# Patient Record
Sex: Female | Born: 1940
Health system: Southern US, Community
[De-identification: ages and names within clinical notes are randomized; demographics above are authoritative.]

## PROBLEM LIST (undated history)

## (undated) DIAGNOSIS — F419 Anxiety disorder, unspecified: Secondary | ICD-10-CM

## (undated) DIAGNOSIS — E119 Type 2 diabetes mellitus without complications: Secondary | ICD-10-CM

## (undated) DIAGNOSIS — F32A Depression, unspecified: Secondary | ICD-10-CM

## (undated) DIAGNOSIS — I639 Cerebral infarction, unspecified: Secondary | ICD-10-CM

## (undated) DIAGNOSIS — G2581 Restless legs syndrome: Secondary | ICD-10-CM

## (undated) DIAGNOSIS — I10 Essential (primary) hypertension: Secondary | ICD-10-CM

## (undated) DIAGNOSIS — F329 Major depressive disorder, single episode, unspecified: Secondary | ICD-10-CM

## (undated) DIAGNOSIS — K810 Acute cholecystitis: Secondary | ICD-10-CM

## (undated) HISTORY — DX: Restless legs syndrome: G25.81

## (undated) HISTORY — DX: Depression, unspecified: F32.A

## (undated) HISTORY — DX: Major depressive disorder, single episode, unspecified: F32.9

## (undated) HISTORY — DX: Anxiety disorder, unspecified: F41.9

---

## 1999-05-12 ENCOUNTER — Other Ambulatory Visit: Admission: RE | Admit: 1999-05-12 | Discharge: 1999-05-12 | Payer: Self-pay | Admitting: Family Medicine

## 2000-06-15 ENCOUNTER — Encounter: Admission: RE | Admit: 2000-06-15 | Discharge: 2000-06-15 | Payer: Self-pay | Admitting: Family Medicine

## 2000-06-15 ENCOUNTER — Encounter: Payer: Self-pay | Admitting: Family Medicine

## 2000-06-16 ENCOUNTER — Encounter: Admission: RE | Admit: 2000-06-16 | Discharge: 2000-06-16 | Payer: Self-pay | Admitting: Family Medicine

## 2000-06-16 ENCOUNTER — Encounter: Payer: Self-pay | Admitting: Family Medicine

## 2000-06-20 ENCOUNTER — Encounter: Payer: Self-pay | Admitting: Neurosurgery

## 2000-06-20 ENCOUNTER — Encounter: Admission: RE | Admit: 2000-06-20 | Discharge: 2000-06-20 | Payer: Self-pay | Admitting: Neurosurgery

## 2000-06-29 ENCOUNTER — Encounter: Payer: Self-pay | Admitting: Family Medicine

## 2000-06-29 ENCOUNTER — Encounter: Admission: RE | Admit: 2000-06-29 | Discharge: 2000-06-29 | Payer: Self-pay | Admitting: Family Medicine

## 2001-06-04 ENCOUNTER — Encounter: Admission: RE | Admit: 2001-06-04 | Discharge: 2001-06-04 | Payer: Self-pay | Admitting: Family Medicine

## 2001-06-04 ENCOUNTER — Encounter: Payer: Self-pay | Admitting: Family Medicine

## 2001-07-29 ENCOUNTER — Encounter: Admission: RE | Admit: 2001-07-29 | Discharge: 2001-10-27 | Payer: Self-pay | Admitting: Family Medicine

## 2001-11-25 ENCOUNTER — Encounter: Payer: Self-pay | Admitting: Family Medicine

## 2001-11-25 ENCOUNTER — Encounter: Admission: RE | Admit: 2001-11-25 | Discharge: 2001-11-25 | Payer: Self-pay | Admitting: Family Medicine

## 2002-07-22 ENCOUNTER — Encounter: Admission: RE | Admit: 2002-07-22 | Discharge: 2002-07-22 | Payer: Self-pay | Admitting: Family Medicine

## 2002-07-22 ENCOUNTER — Encounter: Payer: Self-pay | Admitting: Family Medicine

## 2003-08-10 ENCOUNTER — Encounter: Admission: RE | Admit: 2003-08-10 | Discharge: 2003-08-10 | Payer: Self-pay | Admitting: Family Medicine

## 2003-09-24 ENCOUNTER — Other Ambulatory Visit: Admission: RE | Admit: 2003-09-24 | Discharge: 2003-09-24 | Payer: Self-pay | Admitting: Family Medicine

## 2004-09-16 ENCOUNTER — Encounter: Admission: RE | Admit: 2004-09-16 | Discharge: 2004-09-16 | Payer: Self-pay | Admitting: Family Medicine

## 2005-12-08 ENCOUNTER — Encounter: Admission: RE | Admit: 2005-12-08 | Discharge: 2005-12-08 | Payer: Self-pay | Admitting: Family Medicine

## 2007-02-13 ENCOUNTER — Encounter: Admission: RE | Admit: 2007-02-13 | Discharge: 2007-02-13 | Payer: Self-pay | Admitting: Family Medicine

## 2007-07-05 ENCOUNTER — Emergency Department (HOSPITAL_COMMUNITY): Admission: EM | Admit: 2007-07-05 | Discharge: 2007-07-05 | Payer: Self-pay | Admitting: Emergency Medicine

## 2008-01-24 ENCOUNTER — Inpatient Hospital Stay (HOSPITAL_COMMUNITY): Admission: EM | Admit: 2008-01-24 | Discharge: 2008-01-27 | Payer: Self-pay | Admitting: Emergency Medicine

## 2008-01-24 ENCOUNTER — Encounter (INDEPENDENT_AMBULATORY_CARE_PROVIDER_SITE_OTHER): Payer: Self-pay | Admitting: Internal Medicine

## 2008-01-24 ENCOUNTER — Ambulatory Visit: Payer: Self-pay | Admitting: Physical Medicine & Rehabilitation

## 2008-01-24 ENCOUNTER — Ambulatory Visit: Payer: Self-pay | Admitting: Internal Medicine

## 2008-07-02 ENCOUNTER — Emergency Department (HOSPITAL_COMMUNITY): Admission: EM | Admit: 2008-07-02 | Discharge: 2008-07-02 | Payer: Self-pay | Admitting: Family Medicine

## 2008-07-20 ENCOUNTER — Emergency Department (HOSPITAL_COMMUNITY): Admission: EM | Admit: 2008-07-20 | Discharge: 2008-07-20 | Payer: Self-pay | Admitting: Emergency Medicine

## 2008-07-29 ENCOUNTER — Emergency Department (HOSPITAL_COMMUNITY): Admission: EM | Admit: 2008-07-29 | Discharge: 2008-07-29 | Payer: Self-pay | Admitting: Emergency Medicine

## 2008-08-08 ENCOUNTER — Emergency Department (HOSPITAL_COMMUNITY): Admission: EM | Admit: 2008-08-08 | Discharge: 2008-08-08 | Payer: Self-pay | Admitting: Emergency Medicine

## 2008-12-23 ENCOUNTER — Inpatient Hospital Stay (HOSPITAL_COMMUNITY): Admission: EM | Admit: 2008-12-23 | Discharge: 2008-12-26 | Payer: Self-pay | Admitting: Emergency Medicine

## 2010-12-21 LAB — CBC
HCT: 28.6 % — ABNORMAL LOW (ref 36.0–46.0)
Hemoglobin: 9.6 g/dL — ABNORMAL LOW (ref 12.0–15.0)
MCHC: 34.2 g/dL (ref 30.0–36.0)
MCV: 89.3 fL (ref 78.0–100.0)
Platelets: 188 10*3/uL (ref 150–400)
RBC: 3.21 MIL/uL — ABNORMAL LOW (ref 3.87–5.11)
RBC: 3.32 MIL/uL — ABNORMAL LOW (ref 3.87–5.11)
WBC: 8.3 10*3/uL (ref 4.0–10.5)
WBC: 9.2 10*3/uL (ref 4.0–10.5)

## 2010-12-21 LAB — GLUCOSE, CAPILLARY
Glucose-Capillary: 100 mg/dL — ABNORMAL HIGH (ref 70–99)
Glucose-Capillary: 79 mg/dL (ref 70–99)
Glucose-Capillary: 81 mg/dL (ref 70–99)
Glucose-Capillary: 100 mg/dL — ABNORMAL HIGH (ref 70–99)
Glucose-Capillary: 112 mg/dL — ABNORMAL HIGH (ref 70–99)
Glucose-Capillary: 79 mg/dL (ref 70–99)
Glucose-Capillary: 87 mg/dL (ref 70–99)
Glucose-Capillary: 97 mg/dL (ref 70–99)

## 2010-12-21 LAB — CROSSMATCH
ABO/RH(D): O POS
Antibody Screen: NEGATIVE

## 2010-12-21 LAB — URINE MICROSCOPIC-ADD ON

## 2010-12-21 LAB — COMPREHENSIVE METABOLIC PANEL
AST: 14 U/L (ref 0–37)
BUN: 19 mg/dL (ref 6–23)
CO2: 25 mEq/L (ref 19–32)
Calcium: 8.2 mg/dL — ABNORMAL LOW (ref 8.4–10.5)
Creatinine, Ser: 0.92 mg/dL (ref 0.4–1.2)
GFR calc Af Amer: >60 mL/min (ref >60)
GFR calc non Af Amer: >60 mL/min (ref >60)
Glucose, Bld: 104 mg/dL — ABNORMAL HIGH (ref 70–99)

## 2010-12-21 LAB — PROTIME-INR
INR: 1 (ref 0.00–1.49)
Prothrombin Time: 13.9 seconds (ref 11.6–15.2)

## 2010-12-21 LAB — DIFFERENTIAL
Basophils Absolute: 0 10*3/uL (ref 0.0–0.1)
Lymphocytes Relative: 39 % (ref 12–46)
Lymphs Abs: 2.5 10*3/uL (ref 0.7–4.0)
Neutro Abs: 3.6 10*3/uL (ref 1.7–7.7)
Neutrophils Relative %: 54 % (ref 43–77)

## 2010-12-21 LAB — URINALYSIS, ROUTINE W REFLEX MICROSCOPIC
Bilirubin Urine: NEGATIVE
Hgb urine dipstick: NEGATIVE
Ketones, ur: NEGATIVE mg/dL
Nitrite: NEGATIVE
Specific Gravity, Urine: 1.022 (ref 1.005–1.030)
pH: 5.5 (ref 5.0–8.0)

## 2010-12-21 LAB — HEMOGLOBIN A1C
Hgb A1c MFr Bld: 6 % (ref 4.6–6.1)
Mean Plasma Glucose: 126 mg/dL

## 2010-12-21 LAB — ABO/RH: ABO/RH(D): O POS

## 2010-12-21 LAB — IRON: Iron: 214 ug/dL — ABNORMAL HIGH (ref 42–135)

## 2010-12-21 LAB — FERRITIN: Ferritin: 44 ng/mL (ref 10–291)

## 2010-12-21 LAB — APTT: aPTT: 27 seconds (ref 24–37)

## 2011-01-24 NOTE — Discharge Summary (Signed)
NAMEKAELYNN, IGO NO.:  [id number]   MEDICAL RECORD NO.:  [id number]          PATIENT TYPE:  INP   LOCATION:  3016                         FACILITY:  [facility]   PHYSICIAN:  Lonia Blood, M.D.DATE OF BIRTH:  [birth year]   DATE OF ADMISSION:  01/24/2008  DATE OF DISCHARGE:  01/27/2008                               DISCHARGE SUMMARY   PRIMARY CARE PHYSICIAN:  Annia Friendly. Loleta Chance, MD.   DISCHARGE DIAGNOSES:  1. Left acute lacunar infarcts on the lateral thalamus and caudate      putamen with slight hemorrhagic transformation  2. Multiple remote infarcts.  3. Uncontrolled hypertension.  4. Uncontrolled diabetes mellitus type 2.  5. Hyperlipidemia   DISCHARGE MEDICATIONS:  1. Paroxetine hydrochloride 40 mg daily.  2. Bupropion SR 150 mg daily.  3. HCTZ 25 mg daily.  4. Kapidex 60 mg daily.  5. Januvia 100 mg daily.  6. Zocor 20 mg nightly.  7. Plavix 75 mg daily.  8. Glucophage 1000 mg b.i.d.  9. Lotensin 10 mg daily.  10.Potassium chloride 20 mEq daily.   FOLLOW UP:  The patient is advised to follow up Dr. Loleta Chance on Thursday or  Friday of this week.  (It is presently Monday).  At the time, it is  recommended that the patient's CBGs be evaluated.  She would also  benefit from a BMET to assure that her creatinine is tolerating her ACE  inhibitor therapy and to reevaluate her potassium level.  It is also  advisable that LFTs be obtained on this patient in 6-8 weeks to assure  that the patient is tolerating her Zocor therapy.   CONSULTATIONS:  Dr. Sharene Skeans with [facility].   PROCEDURE:  1. CT scan of the head on Jan 24 2008 - early left lentiform nuclei      internal capsule region hemorrhage with mild edema.  No cortical      based infarct evident.  Advanced small vessel disease.  2. MRI/MRA of the head on Jan 24, 2008 - acute focal hemorrhagic      infarct posterior left putamen and posterior limb of the left      internal capsule.   Remote ischemic insult anterior left putamen.      Remote lacunar infarcts involving left thalamus and right caudate      head.  No significant proximal vessel stenosis, aneurysm or branch      vessel occlusion.  Probable stenosis of the proximal left common      carotid artery within a very horizontal segment extending      posterolaterally from the common origin of the brachiocephalic.      The degree of stenosis may be exaggerated by artifact.  No other      significant cervical stenosis.  3. Modified barium swallow - dysphagia II diet with thin liquids      recommended.   HOSPITAL COURSE:  Ms. Kali Deadwyler is a very pleasant 70 year old  female who was admitted to the hospital on Jan 24, 2008, with complaints  of staggering and falling to the right side.  This was noted  around  10:30 p.m. on Jan 23, 2008, after the patient awoke.  By the time she  presented to the hospital, she was 7 hours post presentation.  She was  therefore not a candidate for aggressive intervention.  CT scan raised  the question of possible hemorrhage.  This was felt to be very small,  however.  The patient was admitted to the acute unit.  Usual stroke  protocol was followed out, including swallowing screen.  Speech and  language pathology ultimately evaluated the patient and cleared her for  a dysphagia II diet with thin liquids.  The patient's primary physical  deficiency was right upper extremity weakness, as well as slurred speech  and a right-sided facial droop.  MRI and MRA was carried out.  No focal  stenosis amenable to intervention was appreciated.  Further evaluation  of the lesion suggested that this likely represented  a lacunar infarct  with a hemorrhagic transformation.  This was the opinion of the  consulting neurology service.  Plavix was added to aspirin therapy.  Hypertension, the patient's blood pressure remained controlled during  her hospital stay.  CBGs were somewhat elevated and a  hemoglobin A1c was  7.5 was appreciated.  As a result, Glucophage was added to the patient's  regimen.  LDL was noted to be elevated at 104 and, therefore, Zocor was  initiated.  The patient tolerated all these interventions without  difficulty.  During the hospital course, the patient's stroke symptoms  improved significantly.  She had no evidence of progression of her  weakness and, in fact, her right upper extremity became stronger during  her hospital stay.  The most significant improvement was in her  dysarthria and dysphagia.  At the time of her discharge, the patient's  facial droop had almost completely resolved and her speech was very  close to normal.  The patient was offered a stay in inpatient rehab, but  she opted to go home at her own request and, therefore, home health  physical therapy, occupational therapy and speech therapy are being  arranged.      Lonia Blood, M.D.  Electronically Signed     JTM/MEDQ  D:  01/27/2008  T:  01/27/2008  Job:  [id number]   cc:   Annia Friendly. Loleta Chance, MD

## 2011-01-24 NOTE — Consult Note (Signed)
NAMEKIEARA, SCHWARK NO.:  [id number]   MEDICAL RECORD NO.:  [id number]          PATIENT TYPE:  INP   LOCATION:  3016                         FACILITY:  [facility]   PHYSICIAN:  Deanna Artis. Hickling, M.D.DATE OF BIRTH:  1941/03/03   DATE OF CONSULTATION:  01/25/2008  DATE OF DISCHARGE:                                 CONSULTATION   CHIEF COMPLAINT:  New left brain stroke with hemorrhagic transformation.   HISTORY OF THE PRESENT CONDITION:  I was asked by Dr. Sharon Seller to see  Tracy [name] who is a 70 year old African American woman with a 5-year  history of type 2 diabetes mellitus and a 26-year history of  hypertension.  The patient has obesity.  She also has mild dyslipidemia.  She has never been a smoker.   The patient went to bed around 10:30 on Jan 23, 2008, in her usual state  of health.  She awakened sometime around midnight and was staggering and  falling to the right side.  EMS was called and she was transported to  the hospital.  By the time that she was seen,  she was 7 hours post half  time normal and Neurology was not called.  [facility] was called to admit  the patient who was noted to have a small hemorrhagic lesion in the left  lateral thalamus/caudal putamen.   MRI scan confirmed that this was an acute non-hemorrhagic infarction  with slight hemorrhagic transformation laterally.  The entire lesion was  left with a centimeter in size.   There were several other remote lesions including the left rostral  thalamus, left head of the caudate, anterior limb of the left internal  capsule and the left external capsule.  On the right side, there was a  small lesion in the right head of the caudate.  These have all  apparently been silent stroke because the patient denies ever having had  a stroke prior to this time.   The patient has made some progress in the hospital.  She has been seen  by PT/OT and speech therapy.  She has had 2-D echocardiogram that  is  pending.  She had MRA of the intracranial vessels and also the  extracranial vessels.  There is no hemodynamically significant lesion in  the vessels.  There is significant narrowing intracranially, but not in  a position where any interventional therapy could take place.   The patient was taken off of Lovenox because of the bleed and placed on  PAS hose.  She has not been placed on an antiplatelet medication and I  will do so now (Plavix).  She has hemoglobin A1c of 7.5 and LDL of 104.  She will need to continue on statin medication and needs improved  control of her diabetes.   PAST MEDICAL HISTORY:  The patient denies other medical problems in the  past.  She was admitted once for pneumonia and seven times for  childbirth.   PAST SURGICAL HISTORY:  None.   CURRENT MEDICATIONS:  1. Zocor 20 mg daily.  2. Protonix 40 mg daily.  3. Senna as-needed at  bedtime.  4. Sliding scale insulin.  5. Januvia 50 mg daily.  To this Glucophage 1000 mg twice daily has      been added.  6. Potassium chloride 20 mEq twice daily.  7. Lotensin 10 mg daily.  8. Lantus insulin 10 units at bedtime.  9. The patient was placed on PAS hose when the bleed was found and it      is yet to be started.  The patient has had some problems with      swallowing, was seen by speech therapy who recommended a D2,      finally chopped diet with nectar-thick liquids.   DRUG ALLERGIES:  None known.   FAMILY HISTORY:  Negative for stroke.  Positive for hypertension in  mother.  Her father died of possible myocardial infarction a number of  years ago.   SOCIAL HISTORY:  The patient does not use tobacco or alcohol.  She is  married.  She has been independent in her activities of daily living  until now.  She is legally blind.   PHYSICAL EXAMINATION:  Temperature 99.7, blood pressure 124/81, resting  pulse rate 95, respirations 18, oxygen saturation 100% on 2 L.  EAR, NOSE and THROAT:  No bruits.  LUNGS:   Clear.  HEART:  No murmurs.  Regular sinus rhythm.  ABDOMEN:  Soft.  Bowel sounds are normal.  The abdomen is protuberant.  No hepatosplenomegaly.  EXTREMITIES:  Normal.  NEUROLOGIC:  NIH stroke scale was 4.  This patient has dysarthria  without dysphasia or dyspraxia.  Cranial nerves, right central seventh  that is mild.  The patient has a left eye amblyopia.  Visual fields are  full.  Fundi are normal.  The patient has a midline tongue air  conduction greater than bone conduction bilaterally.  MOTOR:  The patient's right deltoid is 2, right wrist extensor and  flexor 4+, grip 4+.  Fine motor movements are poor.  Right hip flexor 4.  The rest of her right leg is 5, left side is 5.  There is no drift on  the left.  There is a pronator drift on the right.  Sensory examination,  no hemisensory.  The patient has peripheral polyneuropathy.  Cerebellar  examination, good finger-to-nose, heel-knee-shin better on the left than  the right to weakness.  Gait was not tested.  Deep tendon reflexes,  right reflex predominant.  The patient had bilateral flexor plantar  responses.   IMPRESSION:  1. Left acute lacunar infarction of the lateral thalamus, caudal      putamen with slight hemorrhagic transformation.(434.01)  2. Multiple remote infarctions noted above.  3. Risk factors for stroke including hypertension, type 2 diabetes      mellitus, morbid obesity, and dyslipidemia.   PLAN:  I have described it above.  The Patient has a 2-D echocardiogram  pending.  The rest of the workup is negative.  The patient is ready for  comprehensive inpatient rehabilitation.  There are no other  recommendations that I would make at this time.  We will see the patient  in follow-up at her request.      Deanna Artis. Sharene Skeans, M.D.  Electronically Signed    [facility]/MEDQ  D:  01/25/2008  T:  01/25/2008  Job:  [id number]   cc:   Lonia Blood, M.D.

## 2011-01-24 NOTE — Consult Note (Signed)
[name], [name]:  [id number]   MEDICAL RECORD NO.:  [id number]          PATIENT TYPE:  INP   LOCATION:  3304                         FACILITY:  [facility]   PHYSICIAN:  Anselmo Rod, M.D.  DATE OF BIRTH:  07/27/1941   DATE OF CONSULTATION:  12/23/2008  DATE OF DISCHARGE:                                 CONSULTATION   REASON FOR CONSULTATION:  Rectal bleeding with hypotension.   ASSESSMENT:  1. Significant rectal bleeding with hypotension in a 70 year old black      female.  Rule out colonic polyps, masses, etc.  The patient has a      drop in hemoglobin from 13.6 in November 2009 to 9.8 gm/dL today.  2. Hypotension secondary to rectal bleeding.  3. Gastroesophageal reflux disease.  4. Anemia secondary to rectal bleeding.  5. Hypertension.  6. Adult onset diabetes mellitus.  7. Obesity.  8. Hyperlipidemia.  9. History of cerebrovascular accident on Plavix and Aspirin.  10.Question latex allergy.  11.Chronic constipation.  12.Family history of colon cancer in first degree relative (mother).   RECOMMENDATIONS:  1. The patient will be prepped for a colonoscopy tomorrow.  2. Clear liquid diet today.  3. Serial complete blood counts.  4. Check iron panel with next set of blood draw.  5. Avoid all nonsteroidals for now.  Hold aspirin and Plavix.   DISCUSSION:  [name] is a  70 year old black female admitted  to [facility] today with problems with rectal bleeding.  The  patient claims she was in her usual state of health until this morning  when she woke up and found herself bleeding when she had a bowel  movement, subsequent rectal bleeding with several bowel movements which  prompted her to come to Dr. Tedra Senegal office for further evaluation.  She  was seen, examined and then sent to the emergency room at [facility] for  further evaluation and admission.  The patient had some lower abdominal  cramping.  She denies any nausea, vomiting,  fever, chills or rigors.  She denies any nonsteroidal use, but has been on aspirin, Plavix and she  had a stroke in 2009 with a left brain subcortical infarct involving the  left thalamus and caudate putamen with hemorrhagic transformation.  Appetite is fairly good.  Her weight has been stable.  She has problems  with chronic constipation and can go up to a week without a bowel  movement.   PAST MEDICAL HISTORY:  See list above.   ALLERGIES:  THE PATIENT IS ALLERGIC TO LATEX.  There are no known drug  allergies. ?allergies to NSAIDS.   MEDICATIONS AT HOME:  Aspirin, Plavix, Wellbutrin, HCTZ, Casodex,  Januvia, Zocor, Plavix, Lotensin, Glucophage.   SOCIAL HISTORY:  She is married and lives with her husband who is blind.  She is the primary caregiver for her husband.  She denies the use of  alcohol, tobacco or drugs.   FAMILY HISTORY:  Mother was diagnosed with colon cancer in her 45's but  is doing well 20 years later in her 67's.  There is no other  family  history of breast, uterine, cervical or endometrial cancer.   PHYSICAL EXAMINATION:  GENERAL:  The patient is a pleasant older black  female in no acute distress lying comfortably in bed with stable vital  signs.  Temperature 97.2, blood pressure is 105/90, pulse is 91 per  minute.  Respiratory rate is 18 per minute.  HEENT: ?Squint; EOM intact. Facial symmetry preserved.  NECK:  Supple.  No JVD, thyromegaly, lymphadenopathy.  CHEST:  Clear to auscultation.  S1, S2 regular.  ABDOMEN:  Soft, obese, nontender with normal bowel sounds, no  hepatosplenomegaly. There is some left and right lower quadrant  tenderness on palpation with minimal guarding. No rebound or rigidity.  DRE: Deferred.   LABORATORY EVALUATION:  On admission reveals white count of 6.6K,  hemoglobin of 9.8 grams per deciliter with a platelet count of 240.  PT  13.9 with INR of 1, PTT is normal at 27.  CMP reveals a sodium 142,  potassium 4.6, chloride 1113,  CO2 of 25, glucose 104, BUN 19, creatinine  0.92, total bili 0.6, alk phos 61, AST 14, ALT 10, total protein 5.4,  albumin 3.2, calcium 8.2.  Urinalysis revealed 11-20 white blood cells  per high power field.  CT scan of the abdomen, pelvis done today  revealed distal colonic air-fluid levels question diarrheal disease with  grade 2 degenerative anterior subluxation of L4-L5 and central stenosis.  Two hypodense lesions in the liver consistent with hepatic cyst.  Possible sacral ileitis and mild osteitis pubis.  No definite colonic  mass has been described.   PLAN:  As above.  Further recommendations to be made in followup.      Anselmo Rod, M.D.  Electronically Signed     JNM/MEDQ  D:  12/23/2008  T:  12/23/2008  Job:  [id number]   cc:   [name], M.D.

## 2011-01-24 NOTE — Discharge Summary (Signed)
Tracy [name], Tracy [name]:  [id number]   MEDICAL RECORD NO.:  [id number]          PATIENT TYPE:  INP   LOCATION:  6711                         FACILITY:  [facility]   PHYSICIAN:  Michelene Gardener, MD    DATE OF BIRTH:  February 24, 1941   DATE OF ADMISSION:  12/23/2008  DATE OF DISCHARGE:  12/26/2008                               DISCHARGE SUMMARY   DISCHARGE DIAGNOSES:  1. Acute rectal bleed secondary to diabetic diverticulosis and      hemorrhoids.  2. Acute posthemorrhagic anemia secondary to gastrointestinal bleed.  3. Hypovolemic shock secondary to gastrointestinal bleed that      responded to IV fluids.  4. History of left brain cerebrovascular accident.  5. Diabetes mellitus.  6. Hypertension.  7. Hyperlipidemia.  8. Obesity.  9. Gastroesophageal reflux disease.   DISCHARGE MEDICATIONS:  1. Paxil 40 mg p.o. once daily.  2. Wellbutrin SR 150 mg once a day.  3. Hydrochlorothiazide 25 mg once a day.  4. Casodex 60 mg once a day.  5. Januvia 100 mg once a day.  6. Zocor 20 mg once a day.  7. Lotensin 10 mg once a day.  8. Glucophage 1000 mg twice daily.   CONSULTATIONS:  GI consult on December 23, 2008.   PROCEDURES:  Colonoscopy done on December 24, 2008, and showed hemorrhoids  without evidence of active bleeding in addition to hemorrhoids.   DIAGNOSTIC STUDIES:  1. CT scan of the abdomen without contrast on December 23, 2008, showed      distal colonic air-fluid level that is similar to findings with the      patient's with diarrhea and it showed grade 2 degenerative anterior      subluxation of L4 on L5 and 2 hypodense lesions in the liver most      likely cysts.  2. CT scan of the pelvis with contrast on December 23, 2008, showed no      acute findings.  Follow up with primary doctor within 1-2 weeks.   HOSPITAL COURSE:  1. Rectal bleed.  This patient presented to the hospital with acute      rectal bleeding.  Hemoglobin at the time of admission was 9.8  compared to her baseline, which is 13.  The patient was transfused      2 units of packed RBC.  She was admitted to Step-Down Unit because      of ongoing hypotension.  Her H/H remained stable during the      hospitalization following her transfusion.  GI was consulted during      the hospital and the patient was taken for colonoscopy on December 24, 2008.  Her colonoscopy showed diverticulosis without evidence of      diverticular bleed in addition to hemorrhoids.  Prior to that she      had CT scan of the abdomen and pelvis that came to be negative.      Currently at the time of discharge, the patient is fine.  She does      not have any more bleeding.  She is hemodynamically stable.  2. Acute hemorrhagic anemia secondary to GI bleed, management was      basically as rectal bleed.  3. Hypovolemic shock.  This patient has had significant hypotension      and bradycardia at the time of admission and she was resuscitated      with IV fluids.  She did not require any vasopressors.  Blood      pressure responded well and currently, she is hemodynamically      stable.  After she is hemodynamically stable, she was transferred      out of the Step-Down Unit, and stayed in the floor in 24 hours, and      she remained stable.   Otherwise, other medical conditions remained stable.  The patient will  be discharged home today.  She will take all preadmission medications  except aspirin and Plavix, those would be resumed after 2 weeks if in no  recurrence of bleeding, and we will leave that to her primary doctor.  I  will also hold her hydrochlorothiazide and Lotensin because of stable  blood pressure without blood pressure medicine.  That also might need to  be restarted by her primary doctor based on her blood pressure readings.   TOTAL ASSESSMENT TIME:  1 hour and 40 minutes.      Michelene Gardener, MD  Electronically Signed     NAE/MEDQ  D:  12/26/2008  T:  12/26/2008  Job:  [id number]

## 2011-01-24 NOTE — Consult Note (Signed)
Tracy [name], Tracy [name]:  [id number]   MEDICAL RECORD NO.:  [id number]          PATIENT TYPE:  EMS   LOCATION:  [facility]                         FACILITY:  [facility]   PHYSICIAN:  Marlan Palau, M.D.  DATE OF BIRTH:  [birth year]   DATE OF CONSULTATION:  07/29/2008  DATE OF DISCHARGE:  07/29/2008                                 CONSULTATION   HISTORY OF PRESENT ILLNESS:  Tracy [name] is a 70 year old right-  handed black female born 1941/03/04, with a history of  cerebrovascular disease with a left brain subcortical infarct involving  the left thalamus and caudate and putamen area with slight hemorrhagic  transformation in May 2009.  The patient was thought to have  uncontrolled hypertension at that point.  The patient underwent workup  at that time that included MRI of the brain, MRI angiogram.  The MRI  confirmed acute stroke with some minimal hemorrhagic component.  MRI  angiogram of the intracranial vessels showed some signal loss in the  branch vessels of the MCA greater than posterior cerebral arteries  representing small-vessel atherosclerotic changes.  MRI angiogram of the  neck showed probable stenosis of the proximal left common carotid artery  and no other significant problems were seen.  Echocardiogram at that  time revealed ejection fraction of 65-75%, otherwise unremarkable.  The  patient did not undergo a carotid Doppler study.  The patient was  discharged on Plavix and has done relatively well and is followed by Dr.  [name].  The patient was noted to be fairly normal this morning,  but around 9:30 a.m. was noted to have an event of some slurred speech,  staggery gait, dizziness.  The patient reports some headache around that  time.  The patient was sent to the emergency room by EMS.  The patient  herself claimed that nothing happened, she feels normal, and does not  know why she was sent to the emergency room.  The patient feels she is  back  to her baseline.  CT scan of the brain done through the emergency  room shows the old left lacunar infarcts in the left brain as above.  No  acute changes were seen.  NIH stroke scale score is 4 for dysarthria,  mild right facial droop, and mild drift in both lower extremities.  The  patient has not felt to be a TPA candidate due to minimal deficit.  The  patient herself refuses to come into the hospital.   PAST MEDICAL HISTORY:  Significant for,  1. Transient dizziness, slurred speech, gait disturbance today,      possible TIA, or stroke.  2. Left brain subcortical stroke in May 2009.  3. Obesity.  4. Hypertension.  5. Diabetes.  6. Dyslipidemia.  7. Gastroesophageal reflux disease.   MEDICATIONS:  1. Paxil 40 mg daily.  2. Wellbutrin 150 mg SR tablet daily.  3. Hydrochlorothiazide 25 mg daily.  4. Kapidex 60 mg daily.  5. Januvia 100 mg daily.  6. Zocor 20 mg daily.  7. Plavix 75 mg daily.  8. Glucophage 1000 mg twice daily.  9. Lotensin 10 mg daily.   The patient has allergy to LATEX, intolerant to ASPIRIN.  Does not smoke  or drink.   SOCIAL HISTORY:  This patient is married, lives in the [place], [place]  [place] area, 7 children who are alive and well.  The patient is on  disability for her back.   FAMILY MEDICAL HISTORY:  Mother is alive, has senile dementia,  Alzheimer's type.  Father died with cancer.  The patient has 3 brothers,  4 sisters, one sister has diabetes.   REVIEW OF SYSTEMS:  Notable for no recent fevers, chills.  The patient  had brief headache today and claims to have headaches on every other day  basis, has some slight dizziness at times.  The patient denies any  vision disturbance, trouble swallowing, speech changes, problems  breathing, shortness of breath, chest pain, abdominal pain, nausea,  vomiting, new numbness or weakness of arms or legs.  The patient denies  any blackout episodes.   PHYSICAL EXAMINATION:  VITAL SIGNS:  Blood pressure  is 130/76, heart  rate 126 and regular.  The patient is afebrile.  GENERAL:  This patient is a moderately obese black female who is alert  and cooperative at time of examination.  HEENT:  Head is atraumatic.  Eyes, pupils are round, reactive to light.  Disks soft, flat bilaterally.  NECK:  Supple.  No carotid bruits noted.  RESPIRATORY:  Clear.  CARDIOVASCULAR:  Regular rate and rhythm.  No obvious murmurs or rubs  noted.  EXTREMITIES:  Without significant edema.  NEUROLOGIC:  Cranial nerves as above.  Facial symmetry is not present  with a mild depression of the right nasolabial fold.  Extraocular  movements are relatively full, but on primary gaze, the patient has  slight abduction of the left eye as compared to the right.  The patient  has slight dysarthria.  No aphasia, pinprick sensation of the face is  symmetric, normal.  The patient has good strength to direct testing of  facial muscles, muscles of the hip joint and shoulder shrug bilaterally.  Again, visual fields are full.  MOTOR TESTING:  Good strength on all fours.  Good symmetric motor tone  is noted throughout.  Some drift is noted in both lower extremities in  symmetric fashion.  Sensory testing is intact to pinprick, soft touch,  vibratory sensation throughout.  No evidence of extinction is noted.  The patient has a good finger-nose-finger, heel-to-shin bilaterally.  Gait was not tested.  Deep tendon reflexes are fairly symmetric and some  depressed with ankle jerk reflexes bilaterally.  Toes neutral  bilaterally.   LABORATORY VALUES:  A white count of 16.4, hemoglobin of 13.6,  hematocrit 41.2, platelets of 331.  The rest of blood work is pending at  this point.  CT of the head is as above.   IMPRESSION:  1. History of left brain subcortical stroke in May 2009.  2. Transient slurred speech, dizziness, gait disturbance a day, rule      out TIA, or stroke.  3. Hypertension.  4. Diabetes.  5. Strabismus since  childhood.   This patient does not wish to come in the hospital at this point.  We  will try to get an MRI scan through the emergency room, MRI angiogram of  intracranial vessels.  Due to history of persistent cough over the last  several weeks, we will check a chest x-ray as well.  If the above  studies are unremarkable, the patient may  be sent home as she does not  wish to come in for further evaluation.   The patient is not a TPA candidate due to minimal deficit.  It is not  clear that the patient's current exam represents any new problems.      Marlan Palau, M.D.  Electronically Signed     CKW/MEDQ  D:  07/29/2008  T:  07/30/2008  Job:  [id number]   cc:   [facility]  [name], M.D.

## 2011-01-24 NOTE — H&P (Signed)
NAMETAHJ, LINDSETH NO.:  [id number]   MEDICAL RECORD NO.:  [id number]          PATIENT TYPE:  EMS   LOCATION:  [facility]                         FACILITY:  [facility]   PHYSICIAN:  Tracy [name], M.D.DATE OF BIRTH:  Jul 03, 1941   DATE OF ADMISSION:  12/23/2008  DATE OF DISCHARGE:                              HISTORY & PHYSICAL   PRIMARY CARE PHYSICIAN:  Dr. Mirna [name] and Dr. Renaye [name].   CHIEF COMPLAINT:  Hematochezia.   PRESENT ILLNESS:  [name] is a very pleasant 70 year old  female who is on aspirin and Plavix for secondary stroke prophylaxis.  She was in her usual state of health until this morning when she had a  bowel movement.  She noticed that the bowel movement was a very maroon  colored and also mixed with some bright red [name].  She has not had this  problem in the past.  She does report that she has had intermittent  right lower quadrant crampy kind of abdominal pains off and on for a few  weeks now, but states that she would simply ignore them.  The patient  became somewhat concerned after her first bowel movement but decided  that she would wait and simply monitor her own symptoms at home.  Within  the hour, she had a second bowel movement, however, and this too was  primarily maroon colored bloody material with some bright red [name]  mixed in.  As a result she presented to her physician's office.  After  being assessed in the office she was found to be hypotensive.  As a  result she presented [name] to facilitate a more  rapid evaluation.  In the emergency room, CT scan abdomen and pelvis has  been carried out.  The CT scan fails to reveal any acute significant  finding but does raise the question of possible diarrheal illness in the  distal colon.  My concern is that this likely represents a large volume  of [name] actually collected in this area of the abdomen.  The patient  has indeed proven to be hypotensive in  the emergency room with systolics  as low as 86.  She is significantly orthostatic even when sitting.  She  denies any severe abdominal pain.  The abdominal exam is really  unremarkable.  When lying comfortably, she denies headache, fevers,  chills, nausea or vomiting, chest pain, shortness of breath or  diaphoresis.  She has never had similar symptoms in the past.  She  states that she has been compliant with her daily aspirin and Plavix  therapy but there have been no other recent significant changes in her  medications.   REVIEW OF SYSTEMS:  Comprehensive review of systems unremarkable with  exception of the positive elements noted in history present illness  above.   PAST MEDICAL HISTORY:  1. Left brain CVA to include the lateral thalamus, caudate and putamen      May 2009 with multiple prior CVAs.  2. Hypertension.  3. Diabetes mellitus type 2 day.  4. Hyperlipidemia.  5. Obesity.  6. Gastroesophageal  reflux disease.   OUTPATIENT MEDICATIONS:  1. Aspirin 81 mg daily.  2. Paxil 40 mg daily.  3. Wellbutrin SR 150 mg daily.  4. Hydrochlorothiazide 25 mg daily.  5. Casodex 60 mg daily.  6. Januvia 100 mg daily.  7. Zocor 20 mg daily.  8. Plavix 75 mg daily.  9. Lotensin 10 mg daily.  10.Glucophage 1000 mg b.i.d.   ALLERGIES:  The patient is intolerant to ASPIRIN and she reports a LATEX  allergy.   FAMILY HISTORY:  Noncontributory but reviewed.   SOCIAL HISTORY:  The patient is married.  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  She does not smoke, drink, or use illicit drugs  nor has she ever in the past.   DATA REVIEW:  Hemoglobin is 9.8 with baseline hemoglobin approximately  13.6 as of November 2009.  White count and platelet count normal.  Coags  are normal.  CMET is unremarkable with exception of low total protein of  5.4 and low albumin 3.2.  Renal function is normal 0.92 with BUN of 19.  On CT scan abdomen and pelvis - distal colonic air fluid  levels as can  be seen in diarrheal disease.  Grade 2 degenerative anterior subluxation  of L4 on L5 with associated central stenosis.  Two hypodense lesions in  the liver probably represent cysts.  Suspect probable left sacroiliitis  and mild osteitis pubis.   PHYSICAL EXAMINATION:  VITAL SIGNS:  Temperature 97.5, [name] pressure  92/70 to 86/55, heart rate 127-139, respiratory rate 22, O2 sat 100%  room air.  GENERAL:  Well-developed, well-nourished female in no acute respiratory  distress.  HEENT: Normocephalic, atraumatic.  Pupils equal round react light  accommodation.  Extraocular intact bilaterally.  OC essentially clear.  NECK:  There is no JVD.  No lymphadenopathy.  LUNGS:  Clear to auscultation bilaterally wheezes or rhonchi.  CARDIOVASCULAR:  Tachycardic but regular without gallop or rub.  S1, S2.  ABDOMEN:  Obese, soft bowel sounds hypoactive but present.  No  organomegaly, rebound or ascites.  EXTREMITIES:  No significant cyanosis, clubbing, edema bilateral  extremities.  NEUROLOGIC:  Nonfocal neurologic exam.   IMPRESSION AND PLAN:  1. Hematochezia/acute lower gastrointestinal bleed - Ms. Corwin      describes what sounds like a diverticular type bleed.      Unfortunately, CT scan did not make a specific note of diverticular      disease.  The patient will require a colonoscopy regardless.  She      is not presently, however, hemodynamically stable enough to allow      immediate colonoscopy.  We will admit her to the acute unit.  She      will be monitored very closely, given her hypotension and acute      [name] loss anemia.  Will transfuse the patient as is indicated for      her hypotension and suspected further hemoglobin drop.  GI has been      consulted through Dr. Adaline Sill office and Dr. Loreta Ave will evaluate the      patient later tonight or in the morning.  2. Acute [name] loss anemia secondary to gastrointestinal bleed - as      noted above.  The patient's  hemoglobin at baseline is 13.6.      Hemoglobin 9.8 represents a significant drop in her hemoglobin.      Furthermore, suspect that this will drop even further as evidenced  by her hypotension and tachycardia.  Thus, we will empirically      transfuse the patient with 2 units packed red [name] cells and      follow her hemoglobin every 8 hours thereafter.  3. Hypovolemic shock - the patient is experiencing significant      hypotension and tachycardia related to her gastrointestinal [name]      loss.  We will transfuse the patient primarily as our method of      expanding her volume.  Additionally we will administer isotonic      saline.  We will follow her [name] pressure and heart rate closely      and titrate resuscitative efforts based upon these guidelines.  4. History of left brain cerebrovascular accident.  I have counseled      the patient as the need to discontinue Plavix and aspirin at the      present time.  I have counseled her as to the increased risk of      stroke without these medications.  After the patient's      gastrointestinal evaluation is complete we should be able to make      more permanent recommendations as to the advisability of or against      the use of these medicines.  5. Diabetes mellitus - we will administer sliding scale insulin while      the patient is n.p.o. and follow her CBG closely.  6. Hypertension - currently it is clearly necessary to hold the      patient's antihypertensives.  We will follow her [name] pressure      trend as her volume is replaced.      Tracy [name], M.D.  Electronically Signed     JTM/MEDQ  D:  12/23/2008  T:  12/23/2008  Job:  [id number]   cc:   Annia Friendly. Loleta Chance, MD  Tracy [name], M.D.

## 2011-01-24 NOTE — H&P (Signed)
NAMEBROOKELIN, FELBER NO.:  [id number]   MEDICAL RECORD NO.:  [id number]          PATIENT TYPE:  EMS   LOCATION:  [facility]                         FACILITY:  [facility]   PHYSICIAN:  Altha Harm, MDDATE OF BIRTH:  1941/02/11   DATE OF ADMISSION:  01/24/2008  DATE OF DISCHARGE:                              HISTORY & PHYSICAL   CHIEF COMPLAINT:  Weakness in right upper extremity.   HISTORY OF PRESENT ILLNESS:  This is a 70 year old lady with a history  of diabetes type 2 and hypertension, who more than 5 hours ago got up to  use the bathroom and noted that she was having some difficulty with  walking and moving her right upper extremity.  The patient stated that  she alerted her husband and was able to get to the commode.  She then  was unable to use her arm to come to a standing position.  She noticed  that the weakness in her arm progressed and that her speech became  slurred.  She stated that her gait pattern was only minimally affected.  They called 9-1-1 and was brought to the emergency room.  In the  emergency room the patient was outside of the window for any emergent  intervention for CVA.  She had a CT scan of the head done which shows  possible internal capsule hemorrhage with mild edema.   The patient clearly shows sign of stroke, and we are asked to admit the  patient for such.  The patient states that she has had no fever or  chills, no nausea or vomiting or diarrhea.  She was seen by her primary  care physician, Dr. Loleta Chance, approximately 1 week ago and states that her  blood sugars and blood pressure were under good control at that time.  The patient also states that she had a fasting cholesterol panel done  and was told that her cholesterol was in good odor.   PAST MEDICAL HISTORY:  1. Diabetes type 2.  2. Hypertension.   FAMILY HISTORY:  Though positive, not significant in a patient of this  age.   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  She denies any tobacco, alcohol  or drug use.   CURRENT MEDICATIONS:  1. Hydrochlorothiazide 25 mg p.o. daily.  2. Budeprion SR 150 mg p.o. b.i.d.  3. Paxil 40 mg p.o. daily.  4. Januvia 100 mg p.o. daily.  5. Kapidex 50 mg p.o. daily in the form of samples.   ALLERGIES:  No known drug allergies.   PRIMARY CARE PHYSICIAN:  Annia Friendly. Hill, MD   REVIEW OF SYSTEMS:  Fourteen systems were reviewed.  All systems were  negative except as noted in the HPI.   STUDIES IN THE EMERGENCY ROOM:  White blood cell count of 10.2,  hemoglobin of 12.7, hematocrit of 37.2, platelet count of 300.  Sodium  143, potassium 3.2, chloride 103, bicarb 27, BUN 15, creatinine 0.9.  A  total CK of 148, CK-MB of 0.8 and a troponin  of 0.01.  PT and PTT are in  normal range.   PHYSICAL EXAMINATION:  The patient is resting in bed comfortably in no  acute distress and is nontoxic-appearing.  Heart rate is 112, blood pressure is 136/75, respiratory rate 12, O2  saturations are 95% on room air.  HEENT:  The patient is normocephalic, atraumatic.  She appears to have a  ptosis in the right eye.  Pupils equal, round and reactive to light and  accommodation.  Extraocular movements are intact.  Oropharynx is moist.  The patient has poor dentition.  She has a facial droop to the left and  there is clear facial weakness on the left side.  NECK:  Trachea is midline.  No masses, no thyromegaly, no JVD, no  carotid bruit.  RESPIRATORY:  The patient has normal respiratory effort, equal expansion  bilaterally.  No rales or rhonchi noted on examination.  CARDIOVASCULAR:  She is tachycardic, normal S1 and S2.  No murmurs, rubs  or gallops are noted.  PMI is nondisplaced.  No heaves or thrills on  palpation.  ABDOMEN:  Abdomen is obese, soft, nontender, nondistended.  No masses,  no hepatosplenomegaly noted.  NEUROLOGIC:  The patient has weakness in the  right upper extremity in a  flexion pattern.  DTRs are hyperactive in the right upper extremity.  They are normal in the left upper extremity and bilaterally in the lower  extremities.  Sensation is intact to light touch and proprioception in  the bilateral lower extremities and the left upper extremity.  It is  unreliable in the right upper extremity.  As noted, the patient has a  flexion pattern in the right upper extremity and her strength is  approximately 3-/5 in the right upper extremity.  The patient has normal  heel-to-shin in the bilateral lower extremities.  I am unable to test  past-pointing secondary to  her weakness in the right upper extremity.  MUSCULOSKELETAL:  She has normal range of motion to the joints of the  upper and lower extremities with chronic arthritic changes noted in the  joints.  There is no spinal tenderness noted.  No CVA tenderness is  noted.  PSYCHIATRIC:  She is alert and oriented x3.  She has good cognition and  insight.  Good recent and remote recall.   ASSESSMENT AND PLAN:  This is a patient who presents with what  clinically appears to be a new cerebrovascular accident on the left.  The patient is out of the window for any emergent treatment.  We will  admit the patient and assess her risk factors.  Control her blood  pressures and heart rate.  The patient will be n.p.o. for now as she  appears to have some degree of dysphagia and she will have a speech-  language pathology evaluation for swallow.  The patient will also be  evaluated by physical therapy and occupational therapy and I will also  order a comprehensive inpatient rehab evaluation for this patient.  In  terms of her diabetes, she will have her blood sugars checked q.4 h. and  correction-dose insulin applied as needed.  I will go ahead and check an  RPR on this patient, a homocysteine level and a hypercoagulable panel on  the patient given her age, although she does have risk factors  that  would lead her to have an embolic form of a stroke.  In light of the  fact that the patient does have a possible hemorrhage, I  will institute  prophylactic heparin; however, at this time I will not do full-dose  heparin on this patient until she is further evaluated.  The patient  will have an MRI of the brain and an MRA of the head and neck  tomorrow along with a 2-D echo.  In terms of her tachycardia, we will  get a 12-lead EKG to further evaluate and we will go ahead and start the  patient on beta blocker for better heart rate control.  Further  decisions about this patient will be made based upon initial evaluation  and results of the tests.      Altha Harm, MD  Electronically Signed     MAM/MEDQ  D:  01/24/2008  T:  01/24/2008  Job:  [id number]   cc:   Annia Friendly. Loleta Chance, MD

## 2011-06-07 LAB — DIFFERENTIAL
Basophils Absolute: 0.1
Eosinophils Absolute: 0.6
Eosinophils Relative: 5
Lymphocytes Relative: 35
Neutrophils Relative %: 53

## 2011-06-07 LAB — POTASSIUM: Potassium: 3.6

## 2011-06-07 LAB — LUPUS ANTICOAGULANT PANEL
Lupus Anticoagulant: NOT DETECTED
dRVVT Incubated 1:1 Mix: 38.7 (ref 36.1–47.0)

## 2011-06-07 LAB — CK TOTAL AND CKMB (NOT AT ARMC)
CK, MB: 0.9
CK, MB: 0.9
CK, MB: 1.3
Relative Index: 0.7
Relative Index: 1.1
Total CK: 124
Total CK: 148

## 2011-06-07 LAB — COMPREHENSIVE METABOLIC PANEL
AST: 29
CO2: 27
Chloride: 103
Creatinine, Ser: 0.92
GFR calc Af Amer: >60
GFR calc non Af Amer: >60
Glucose, Bld: 195 — ABNORMAL HIGH
Total Bilirubin: 0.7

## 2011-06-07 LAB — PROTHROMBIN GENE MUTATION

## 2011-06-07 LAB — TSH: TSH: 1.333

## 2011-06-07 LAB — BETA-2-GLYCOPROTEIN I ABS, IGG/M/A: Beta-2-Glycoprotein I IgA: 12 U/mL (ref <10)

## 2011-06-07 LAB — BASIC METABOLIC PANEL
BUN: 11
CO2: 28
CO2: 31
Calcium: 8.2 — ABNORMAL LOW
Calcium: 8.6
Creatinine, Ser: 0.78
Creatinine, Ser: 0.79
GFR calc Af Amer: >60
Glucose, Bld: 147 — ABNORMAL HIGH

## 2011-06-07 LAB — PROTIME-INR
INR: 0.8
Prothrombin Time: 11.7

## 2011-06-07 LAB — CBC
HCT: 37.2
MCHC: 33.8
MCHC: 34.3
MCV: 86.6
Platelets: 275
Platelets: 300
RDW: 15.9 — ABNORMAL HIGH
RDW: 16 — ABNORMAL HIGH
WBC: 10.2

## 2011-06-07 LAB — CARDIOLIPIN ANTIBODIES, IGG, IGM, IGA
Anticardiolipin IgA: 20 (ref <13)
Anticardiolipin IgG: 7 — ABNORMAL LOW (ref <11)
Anticardiolipin IgM: <7 — ABNORMAL LOW (ref <10)

## 2011-06-07 LAB — LIPID PANEL
Cholesterol: 157
HDL: 41

## 2011-06-07 LAB — PROTEIN C, TOTAL: Protein C, Total: 92 % (ref 70–140)

## 2011-06-07 LAB — APTT: aPTT: <20 — ABNORMAL LOW

## 2011-06-07 LAB — TROPONIN I: Troponin I: 0.01

## 2011-06-07 LAB — FACTOR 5 LEIDEN

## 2011-06-07 LAB — HEMOGLOBIN A1C: Mean Plasma Glucose: 190

## 2011-06-07 LAB — MAGNESIUM: Magnesium: 2.1

## 2011-06-07 LAB — RPR: RPR Ser Ql: NONREACTIVE

## 2011-06-13 LAB — CBC
MCHC: 32.9
MCHC: 33.5
MCV: 89
MCV: 90.8
Platelets: 331
Platelets: 393
Platelets: 411 — ABNORMAL HIGH
RBC: 4.56
RDW: 14.8
WBC: 6.1

## 2011-06-13 LAB — CK TOTAL AND CKMB (NOT AT ARMC)
CK, MB: 0.7
Total CK: 112

## 2011-06-13 LAB — PROTIME-INR
INR: 1
INR: 1
Prothrombin Time: 12.9
Prothrombin Time: 13.8

## 2011-06-13 LAB — DIFFERENTIAL
Basophils Absolute: 0.6 — ABNORMAL HIGH
Basophils Relative: 0
Basophils Relative: 5 — ABNORMAL HIGH
Eosinophils Absolute: 0.2
Eosinophils Absolute: 0.3
Lymphs Abs: 1.3
Lymphs Abs: 2.3
Monocytes Relative: 2 — ABNORMAL LOW
Monocytes Relative: 6
Neutro Abs: 14.6 — ABNORMAL HIGH
Neutro Abs: 4.7
Neutrophils Relative %: 41 — ABNORMAL LOW
Neutrophils Relative %: 52
Neutrophils Relative %: 89 — ABNORMAL HIGH

## 2011-06-13 LAB — APTT
aPTT: 30
aPTT: 31

## 2011-06-13 LAB — POCT I-STAT, CHEM 8
BUN: 12
Chloride: 106
Glucose, Bld: 97
HCT: 43
HCT: 43
Hemoglobin: 14.6
Hemoglobin: 14.6
Potassium: 4
Sodium: 143
Sodium: 145
TCO2: 28

## 2011-06-13 LAB — B-NATRIURETIC PEPTIDE (CONVERTED LAB): Pro B Natriuretic peptide (BNP): <30

## 2011-06-13 LAB — COMPREHENSIVE METABOLIC PANEL
Alkaline Phosphatase: 82
BUN: 9
CO2: 26
Calcium: 8.9
GFR calc non Af Amer: 59 — ABNORMAL LOW
Glucose, Bld: 130 — ABNORMAL HIGH
Total Protein: 6.8

## 2011-06-13 LAB — HEPATIC FUNCTION PANEL
ALT: 14
Indirect Bilirubin: 0.3
Total Protein: 7

## 2011-06-13 LAB — D-DIMER, QUANTITATIVE: D-Dimer, Quant: 0.72 — ABNORMAL HIGH

## 2011-06-13 LAB — POCT CARDIAC MARKERS
CKMB, poc: <1 — ABNORMAL LOW
Myoglobin, poc: 82.4

## 2012-11-04 ENCOUNTER — Telehealth: Payer: Self-pay | Admitting: Internal Medicine

## 2012-11-04 NOTE — Telephone Encounter (Signed)
The patient's brother called and is hoping to get her in as a new patient with the practice.  The patient stated she had mcr and bcbs.  I asked him if we could speak to the patient directly, and he said "no, she has had a stroke".  The patient's brother became agitated on the phone, and stated he would call back later.  The patient's brother was offered the next available new pt apt with Nicki Reaper and with Dr.Jones, but both were denied by the caller.

## 2012-11-25 ENCOUNTER — Ambulatory Visit: Payer: Self-pay | Admitting: Internal Medicine

## 2013-02-06 ENCOUNTER — Encounter (HOSPITAL_COMMUNITY): Payer: Self-pay | Admitting: Emergency Medicine

## 2013-02-06 ENCOUNTER — Emergency Department (HOSPITAL_COMMUNITY)
Admission: EM | Admit: 2013-02-06 | Discharge: 2013-02-06 | Disposition: A | Discharge status: Home / Self Care | Payer: Medicare Other | Attending: Emergency Medicine | Admitting: Emergency Medicine

## 2013-02-06 ENCOUNTER — Emergency Department (HOSPITAL_COMMUNITY): Payer: Medicare Other

## 2013-02-06 DIAGNOSIS — Y93G3 Activity, cooking and baking: Secondary | ICD-10-CM | POA: Insufficient documentation

## 2013-02-06 DIAGNOSIS — W261XXA Contact with sword or dagger, initial encounter: Secondary | ICD-10-CM | POA: Insufficient documentation

## 2013-02-06 DIAGNOSIS — Y9289 Other specified places as the place of occurrence of the external cause: Secondary | ICD-10-CM | POA: Insufficient documentation

## 2013-02-06 DIAGNOSIS — Z79899 Other long term (current) drug therapy: Secondary | ICD-10-CM | POA: Insufficient documentation

## 2013-02-06 DIAGNOSIS — W260XXA Contact with knife, initial encounter: Secondary | ICD-10-CM | POA: Insufficient documentation

## 2013-02-06 DIAGNOSIS — E119 Type 2 diabetes mellitus without complications: Secondary | ICD-10-CM | POA: Insufficient documentation

## 2013-02-06 DIAGNOSIS — S91109A Unspecified open wound of unspecified toe(s) without damage to nail, initial encounter: Secondary | ICD-10-CM | POA: Insufficient documentation

## 2013-02-06 DIAGNOSIS — Z7902 Long term (current) use of antithrombotics/antiplatelets: Secondary | ICD-10-CM | POA: Insufficient documentation

## 2013-02-06 DIAGNOSIS — S91119A Laceration without foreign body of unspecified toe without damage to nail, initial encounter: Secondary | ICD-10-CM

## 2013-02-06 DIAGNOSIS — I10 Essential (primary) hypertension: Secondary | ICD-10-CM | POA: Insufficient documentation

## 2013-02-06 DIAGNOSIS — Z8673 Personal history of transient ischemic attack (TIA), and cerebral infarction without residual deficits: Secondary | ICD-10-CM | POA: Insufficient documentation

## 2013-02-06 HISTORY — DX: Type 2 diabetes mellitus without complications: E11.9

## 2013-02-06 HISTORY — DX: Cerebral infarction, unspecified: I63.9

## 2013-02-06 HISTORY — DX: Essential (primary) hypertension: I10

## 2013-02-06 MED ORDER — TETANUS-DIPHTH-ACELL PERTUSSIS 5-2.5-18.5 LF-MCG/0.5 IM SUSP: 0.5000 mL | Freq: Once | INTRAMUSCULAR | Status: DC

## 2013-02-06 NOTE — ED Provider Notes (Signed)
History     CSN: [id number]  Arrival date & time 02/06/13  1609   First MD Initiated Contact with Patient 02/06/13 1637      Chief Complaint  Patient presents with  . Extremity Laceration    HPI 72 year old female with history of diabetes presents with a laceration to her right great toe. She was in the kitchen chopping vegetables with a knife when she dropped the kife and it landed point down on her toe. She suffered significant blood loss per EMS. She reports moderate pain initially, but her pain is well controlled now with no intervention. She is on Plavix due to a prior stroke. On arrival to the emergency department, nursing reports that her toe was bleeding significantly and it was apparent due to a large garbage bag full blood wrapped around her leg that she lost a significant amount of blood. However she reports that she is not lightheaded, dizzy, nauseated, or otherwise symptomatic.  Bleeding stopped when nursing held pressure.  She reports that the tip of the knife was missing when he was picked up off the floor   Past Medical History  Diagnosis Date  . Diabetes mellitus without complication   . Hypertension   . Stroke     No past surgical history on file.  No family history on file.  History  Substance Use Topics  . Smoking status: Never Smoker   . Smokeless tobacco: Not on file  . Alcohol Use: No    OB History   Grav Para Term Preterm Abortions TAB SAB Ect Mult Living                  Review of Systems  Constitutional: Negative for fever, chills and diaphoresis.  HENT: Negative for congestion, rhinorrhea, neck pain and neck stiffness.   Respiratory: Negative for cough, shortness of breath and wheezing.   Cardiovascular: Negative for chest pain and leg swelling.  Gastrointestinal: Negative for nausea, vomiting, abdominal pain and diarrhea.  Genitourinary: Negative for dysuria, urgency, frequency, flank pain, vaginal bleeding, vaginal discharge and difficulty  urinating.  Skin: Negative for rash.  Neurological: Negative for weakness, numbness and headaches.  All other systems reviewed and are negative.    Allergies  Review of patient's allergies indicates not on file.  Home Medications   Current Outpatient Rx  Name  Route  Sig  Dispense  Refill  . acetaminophen (TYLENOL) 325 MG tablet   Oral   Take 650 mg by mouth every 6 (six) hours as needed for pain.         Marland Kitchen buPROPion (WELLBUTRIN SR) 150 MG 12 hr tablet   Oral   Take 150 mg by mouth every evening.         . clopidogrel (PLAVIX) 75 MG tablet   Oral   Take 75 mg by mouth every evening.         . loratadine (CLARITIN) 10 MG tablet   Oral   Take 10 mg by mouth daily.         . metFORMIN (GLUCOPHAGE) 500 MG tablet   Oral   Take 1,000 mg by mouth every evening.         Marland Kitchen PARoxetine (PAXIL) 40 MG tablet   Oral   Take 40 mg by mouth every morning.         Marland Kitchen spironolactone (ALDACTONE) 25 MG tablet   Oral   Take 25 mg by mouth 2 (two) times daily.  BP 137/73  Pulse 98  Temp(Src) 98.6 F (37 C) (Oral)  Resp 16  Wt 200 lb (90.719 kg)  SpO2 97%  Physical Exam  Nursing note and vitals reviewed. Constitutional: She is oriented to person, place, and time. She appears well-developed and well-nourished. No distress.  HENT:  Head: Normocephalic and atraumatic.  Mouth/Throat: Oropharynx is clear and moist.  Eyes: Conjunctivae and EOM are normal. Pupils are equal, round, and reactive to light. No scleral icterus.  Neck: Normal range of motion. Neck supple. No JVD present.  Cardiovascular: Normal rate, regular rhythm, normal heart sounds and intact distal pulses.  Exam reveals no gallop and no friction rub.   No murmur heard. Pulmonary/Chest: Effort normal and breath sounds normal. No respiratory distress. She has no wheezes. She has no rales.  Abdominal: Soft. Bowel sounds are normal. She exhibits no distension. There is no tenderness. There is no  rebound and no guarding.  Musculoskeletal: She exhibits no edema.  Superficial laceration to right great toe, hemostatic, no foreign body, wound edges well approximated.  Neurological: She is alert and oriented to person, place, and time. No cranial nerve deficit. She exhibits normal muscle tone. Coordination normal.  Skin: Skin is warm and dry. She is not diaphoretic.    ED Course  Procedures (including critical care time)  Labs Reviewed - No data to display Dg Foot 2 Views Right  02/06/2013   *RADIOLOGY REPORT*  Clinical Data: Laceration to great toe with knife.  Query foreign body.  RIGHT FOOT - 2 VIEW  Comparison: None.  Findings: Bandaging noted across the toes.  No foreign body in the vicinity of the great toe.  Alignment at the Lisfranc joint appears normal.  No fracture identified.  Plantar calcaneal spur noted.  Dorsal midfoot spurring is present. Vascular calcifications noted.  IMPRESSION:  1.  Aside from the bandaging around the toes, no foreign body is observed. 2.  No acute bony findings.   Original Report Authenticated By: Gaylyn Rong, M.D.     1. Toe laceration, initial encounter       MDM  72 year old female presents with a toe laceration after she dropped a knife on her foot while cooking. Significant blood loss prior to arrival according to patient. She is otherwise asymptomatic. Bleeding resolved when nursing held pressure. She is on Plavix. She states that the tip of the knife was missing when she picked it up. X-ray does not demonstrate a foreign body. The laceration is approximately 1 cm, wound edges are well approximated, is hemostatic a mild dilation. Clot is starting to form. He does not appear to need sutures. I did apply Dermabond. Given infection precautions. She is to follow with her primary care physician for reevaluation.       Toney Sang, MD 02/07/13 [phone]

## 2013-02-06 NOTE — ED Notes (Signed)
Small left great toe closed with Dermabond by Dr. Peyton Najjar.  Foot cleaned and shoe cover applied

## 2013-02-06 NOTE — ED Notes (Signed)
Pt sent from [facility] for laceration to right great toe. Pt dropped knife while chopping onions and knife went into toe under nail area. Pt on coumadin. Direct pressure held and bleeding controlled at this time.

## 2013-02-11 NOTE — ED Provider Notes (Addendum)
I saw and evaluated the patient, reviewed the resident's note and I agree with the findings and plan.   .Face to face Exam:  General:  Awake HEENT:  Atraumatic Resp:  Normal effort Abd:  Nondistended Neuro:No focal weakness   I supervised and oversaw the laceration repair by the resident.  Nelia Shi, MD 02/11/13 1951  Nelia Shi, MD 02/21/13 2230

## 2013-08-07 ENCOUNTER — Emergency Department (HOSPITAL_COMMUNITY)
Admission: EM | Admit: 2013-08-07 | Discharge: 2013-08-08 | Disposition: A | Discharge status: Home / Self Care | Payer: Medicare Other | Attending: Emergency Medicine | Admitting: Emergency Medicine

## 2013-08-07 ENCOUNTER — Encounter (HOSPITAL_COMMUNITY): Payer: Self-pay | Admitting: Emergency Medicine

## 2013-08-07 DIAGNOSIS — R140 Abdominal distension (gaseous): Secondary | ICD-10-CM

## 2013-08-07 DIAGNOSIS — E119 Type 2 diabetes mellitus without complications: Secondary | ICD-10-CM | POA: Insufficient documentation

## 2013-08-07 DIAGNOSIS — R141 Gas pain: Secondary | ICD-10-CM | POA: Insufficient documentation

## 2013-08-07 DIAGNOSIS — M545 Low back pain, unspecified: Secondary | ICD-10-CM | POA: Insufficient documentation

## 2013-08-07 DIAGNOSIS — Z7902 Long term (current) use of antithrombotics/antiplatelets: Secondary | ICD-10-CM | POA: Insufficient documentation

## 2013-08-07 DIAGNOSIS — R109 Unspecified abdominal pain: Secondary | ICD-10-CM | POA: Insufficient documentation

## 2013-08-07 DIAGNOSIS — I10 Essential (primary) hypertension: Secondary | ICD-10-CM | POA: Insufficient documentation

## 2013-08-07 DIAGNOSIS — Z8673 Personal history of transient ischemic attack (TIA), and cerebral infarction without residual deficits: Secondary | ICD-10-CM | POA: Insufficient documentation

## 2013-08-07 DIAGNOSIS — R142 Eructation: Secondary | ICD-10-CM | POA: Insufficient documentation

## 2013-08-07 DIAGNOSIS — R1110 Vomiting, unspecified: Secondary | ICD-10-CM | POA: Insufficient documentation

## 2013-08-07 DIAGNOSIS — M549 Dorsalgia, unspecified: Secondary | ICD-10-CM

## 2013-08-07 DIAGNOSIS — Z79899 Other long term (current) drug therapy: Secondary | ICD-10-CM | POA: Insufficient documentation

## 2013-08-07 LAB — CBC WITH DIFFERENTIAL/PLATELET
Basophils Absolute: 0 10*3/uL (ref 0.0–0.1)
Basophils Relative: 0 % (ref 0–1)
Eosinophils Absolute: 0 10*3/uL (ref 0.0–0.7)
HCT: 37.1 % (ref 36.0–46.0)
MCH: 29 pg (ref 26.0–34.0)
MCHC: 33.7 g/dL (ref 30.0–36.0)
Monocytes Absolute: 0.8 10*3/uL (ref 0.1–1.0)
Neutro Abs: 9.4 10*3/uL — ABNORMAL HIGH (ref 1.7–7.7)
RDW: 16.4 % — ABNORMAL HIGH (ref 11.5–15.5)

## 2013-08-07 LAB — BASIC METABOLIC PANEL
Calcium: 9.3 mg/dL (ref 8.4–10.5)
Chloride: 98 mEq/L (ref 96–112)
Creatinine, Ser: 0.88 mg/dL (ref 0.50–1.10)
GFR calc Af Amer: 74 mL/min — ABNORMAL LOW (ref >90)
GFR calc non Af Amer: 64 mL/min — ABNORMAL LOW (ref >90)

## 2013-08-07 LAB — GLUCOSE, CAPILLARY: Glucose-Capillary: 137 mg/dL — ABNORMAL HIGH (ref 70–99)

## 2013-08-07 MED ORDER — ALUM & MAG HYDROXIDE-SIMETH 200-200-20 MG/5ML PO SUSP
15.0000 mL | Freq: Once | ORAL | Status: AC
  Administered 2013-08-07: 15 mL via ORAL
  Filled 2013-08-07: qty 30

## 2013-08-07 MED ORDER — ACETAMINOPHEN 325 MG PO TABS
650.0000 mg | ORAL_TABLET | Freq: Once | ORAL | Status: AC
  Administered 2013-08-07: 650 mg via ORAL
  Filled 2013-08-07: qty 2

## 2013-08-07 NOTE — ED Provider Notes (Signed)
CSN: [id number]     Arrival date & time 08/07/13  2133 History   First MD Initiated Contact with Patient 08/07/13 2153     Chief Complaint  Patient presents with  . Back Pain   (Consider location/radiation/quality/duration/timing/severity/associated sxs/prior Treatment) HPI  Chief complaint: Back pain history provided by patient  Patient is a 72 year old female past medical history significant for diabetes he comes today chief complaint back pain. Patient states she has waxing and waning pain in in her lower back. This is located lateral to the spine bilaterally. Has been worse with movement twisting and activity. Is made better with rest. Patient is been taking Tylenol and ibuprofen over-the-counter with mild relief of symptoms. It is been going on for approximately 2 weeks. It is unchanged. Patient states her some radiation of this pain from her lower back or her flanks. Patient denies any dysuria, fevers, chills, saddle anesthesia or urinary incontinence.   Past Medical History  Diagnosis Date  . Diabetes mellitus without complication   . Hypertension   . Stroke    History reviewed. No pertinent past surgical history. No family history on file. History  Substance Use Topics  . Smoking status: Never Smoker   . Smokeless tobacco: Not on file  . Alcohol Use: No   OB History   Grav Para Term Preterm Abortions TAB SAB Ect Mult Living                 Review of Systems  Constitutional: Negative for fatigue.  Respiratory: Negative for shortness of breath.   Cardiovascular: Negative for chest pain.  Gastrointestinal: Positive for abdominal pain (back pain radiate slightly to flanks. Also bloating type pain now).  Genitourinary: Negative for dysuria.  Musculoskeletal: Positive for back pain.  Skin: Negative for rash.  Neurological: Negative for headaches.  Psychiatric/Behavioral: Negative for agitation.  All other systems reviewed and are negative.    Allergies  Review of  patient's allergies indicates not on file.  Home Medications   Current Outpatient Rx  Name  Route  Sig  Dispense  Refill  . acetaminophen (TYLENOL) 325 MG tablet   Oral   Take 650 mg by mouth every 6 (six) hours as needed for pain.         Marland Kitchen buPROPion (WELLBUTRIN XL) 150 MG 24 hr tablet   Oral   Take 150 mg by mouth daily.         . clopidogrel (PLAVIX) 75 MG tablet   Oral   Take 75 mg by mouth every evening.         . loratadine (CLARITIN) 10 MG tablet   Oral   Take 10 mg by mouth daily.         . metFORMIN (GLUCOPHAGE) 500 MG tablet   Oral   Take 1,000 mg by mouth 2 (two) times daily with a meal.          . PARoxetine (PAXIL) 40 MG tablet   Oral   Take 40 mg by mouth every morning.         Marland Kitchen spironolactone (ALDACTONE) 25 MG tablet   Oral   Take 25 mg by mouth 2 (two) times daily.         . ondansetron (ZOFRAN) 4 MG tablet   Oral   Take 1 tablet (4 mg total) by mouth every 8 (eight) hours as needed for nausea or vomiting.   10 tablet   0    BP 134/81  Pulse 110  Temp(Src)  97.7 F (36.5 C) (Oral)  Resp 18  SpO2 95% Physical Exam  Nursing note and vitals reviewed. Constitutional: She is oriented to person, place, and time. She appears well-developed and well-nourished.  HENT:  Head: Normocephalic and atraumatic.  Eyes: EOM are normal. Pupils are equal, round, and reactive to light.  Neck: Normal range of motion.  Cardiovascular: Regular rhythm and intact distal pulses.   HR 90's on exam   Pulmonary/Chest: Effort normal and breath sounds normal. No respiratory distress. She exhibits no tenderness.  Abdominal: Soft. She exhibits no distension. There is no tenderness. There is no rebound and no guarding.  Genitourinary:  Denies urinary incontinence or saddle anesthesia. Patient does not wish for GU exam at this time. However states these are not issues  Musculoskeletal: Normal range of motion.  Mild tenderness palpation lower back above gluteal  areas- sciatic distribution   Neurological: She is alert and oriented to person, place, and time. No cranial nerve deficit. She exhibits normal muscle tone. Coordination normal.  Motor, sensory or coordination deficits identified.  Skin: Skin is warm and dry.  Psychiatric: She has a normal mood and affect. Her behavior is normal. Judgment and thought content normal.    ED Course  Procedures (including critical care time) Labs Review Labs Reviewed  URINALYSIS, ROUTINE W REFLEX MICROSCOPIC - Abnormal; Notable for the following:    Color, Urine AMBER (*)    Bilirubin Urine SMALL (*)    Protein, ur 30 (*)    Urobilinogen, UA 2.0 (*)    All other components within normal limits  CBC WITH DIFFERENTIAL - Abnormal; Notable for the following:    WBC 11.7 (*)    RDW 16.4 (*)    Neutrophils Relative % 81 (*)    Neutro Abs 9.4 (*)    All other components within normal limits  BASIC METABOLIC PANEL - Abnormal; Notable for the following:    Glucose, Bld 143 (*)    GFR calc non Af Amer 64 (*)    GFR calc Af Amer 74 (*)    All other components within normal limits  GLUCOSE, CAPILLARY - Abnormal; Notable for the following:    Glucose-Capillary 137 (*)    All other components within normal limits  URINE MICROSCOPIC-ADD ON   Imaging Review No results found.  EKG Interpretation   None       MDM   1. Back pain   2. Bloating     72 year old female with signs and symptoms consistent with musculoskeletal back pain. Pain is worse with twisting and bending. This pain is located lateral over the musculature. Not spinal pain. Patient has a normal neurological exam. No motor, sensation coordination dysfunction. No reports of saddle anesthesia or urinary incontinence. Patient did have some concern for radiation from the back towards flanks. Secondary to this UA was obtained. UA showed no signs of UTI. No true CVA tenderness. Doubt UTI or pyelonephritis. Patient also complaining of some  postprandial bloating. Patient denies abdominal exam with no signs of rebound, guarding or peritonitis. Not surgical abdomen. Some relief of blood with GI cocktail. Doubt any serious intra-abdominal pathology. Patient with history of diabetes. Finger stick glucose 137. Doubt crampy abdominal pain or back pain secondary to DKA. Patient with no other concerning findings on reexamination. Patient continues to be nontoxic appearing. In no apparent distress. The patient is appropriate for discharge at this time. We'll recommend follow up with PCP within the next several days. Patient is given strict term cautions  for worsening back pain, neurological deficits, worsening abdominal pain etc. Patient does some occasional nausea. However is tolerating by mouth and abdominal reexam benign. We'll provide prescriptions for Zofran. Recommend nsaid for a musculoskeletal type back pain. Patient's questions were answered discharged no further acute issues  Patient discussed with attending Dr. Anitra Lauth.      Bridgett Larsson, MD 08/08/13 [phone]

## 2013-08-07 NOTE — ED Notes (Signed)
Pt c/o mid to lower back pain onset 2 weeks ago, also c/o lower abd pain, st's abd feels bloated. Denies any nausea or urinary symptoms

## 2013-08-08 LAB — URINALYSIS, ROUTINE W REFLEX MICROSCOPIC
Glucose, UA: NEGATIVE mg/dL
Ketones, ur: NEGATIVE mg/dL
Leukocytes, UA: NEGATIVE
Nitrite: NEGATIVE
Protein, ur: 30 mg/dL — AB

## 2013-08-08 MED ORDER — ONDANSETRON 4 MG PO TBDP: 4.0000 mg | ORAL_TABLET | Freq: Once | ORAL | Status: DC

## 2013-08-08 MED ORDER — ONDANSETRON HCL 4 MG PO TABS: 4.0000 mg | ORAL_TABLET | Freq: Three times a day (TID) | ORAL | Status: DC | PRN

## 2013-08-08 NOTE — ED Notes (Signed)
Patient no present in room when this RN attempted to provide discharge paperwork at 0130. Currently patient still missing. Patient presumed to have left after MD informed them of dispo and provided discharge instruction.

## 2013-08-09 ENCOUNTER — Inpatient Hospital Stay (HOSPITAL_COMMUNITY)
Admission: EM | Admit: 2013-08-09 | Discharge: 2013-08-14 | DRG: 418 | Disposition: A | Discharge status: Home / Self Care | Payer: Medicare Other | Attending: Internal Medicine | Admitting: Internal Medicine

## 2013-08-09 ENCOUNTER — Encounter (HOSPITAL_COMMUNITY): Payer: Self-pay | Admitting: Emergency Medicine

## 2013-08-09 DIAGNOSIS — I69959 Hemiplegia and hemiparesis following unspecified cerebrovascular disease affecting unspecified side: Secondary | ICD-10-CM

## 2013-08-09 DIAGNOSIS — Z7902 Long term (current) use of antithrombotics/antiplatelets: Secondary | ICD-10-CM

## 2013-08-09 DIAGNOSIS — K810 Acute cholecystitis: Secondary | ICD-10-CM | POA: Diagnosis present

## 2013-08-09 DIAGNOSIS — K8050 Calculus of bile duct without cholangitis or cholecystitis without obstruction: Secondary | ICD-10-CM | POA: Diagnosis present

## 2013-08-09 DIAGNOSIS — K8066 Calculus of gallbladder and bile duct with acute and chronic cholecystitis without obstruction: Principal | ICD-10-CM | POA: Diagnosis present

## 2013-08-09 DIAGNOSIS — E119 Type 2 diabetes mellitus without complications: Secondary | ICD-10-CM

## 2013-08-09 DIAGNOSIS — I10 Essential (primary) hypertension: Secondary | ICD-10-CM | POA: Diagnosis present

## 2013-08-09 DIAGNOSIS — K819 Cholecystitis, unspecified: Secondary | ICD-10-CM

## 2013-08-09 DIAGNOSIS — E876 Hypokalemia: Secondary | ICD-10-CM | POA: Diagnosis not present

## 2013-08-09 DIAGNOSIS — Z8673 Personal history of transient ischemic attack (TIA), and cerebral infarction without residual deficits: Secondary | ICD-10-CM

## 2013-08-09 DIAGNOSIS — N390 Urinary tract infection, site not specified: Secondary | ICD-10-CM | POA: Diagnosis present

## 2013-08-09 HISTORY — DX: Acute cholecystitis: K81.0

## 2013-08-09 LAB — COMPREHENSIVE METABOLIC PANEL
ALT: 141 U/L — ABNORMAL HIGH (ref 0–35)
Albumin: 3.6 g/dL (ref 3.5–5.2)
Alkaline Phosphatase: 244 U/L — ABNORMAL HIGH (ref 39–117)
Calcium: 9 mg/dL (ref 8.4–10.5)
Potassium: 3.6 mEq/L (ref 3.5–5.1)
Sodium: 135 mEq/L (ref 135–145)
Total Protein: 7.3 g/dL (ref 6.0–8.3)

## 2013-08-09 LAB — CBC WITH DIFFERENTIAL/PLATELET
Basophils Relative: 0 % (ref 0–1)
Eosinophils Absolute: 0.1 10*3/uL (ref 0.0–0.7)
Eosinophils Relative: 1 % (ref 0–5)
MCH: 29.3 pg (ref 26.0–34.0)
MCHC: 34.1 g/dL (ref 30.0–36.0)
MCV: 85.8 fL (ref 78.0–100.0)
Neutrophils Relative %: 83 % — ABNORMAL HIGH (ref 43–77)
Platelets: 287 10*3/uL (ref 150–400)
RBC: 4 MIL/uL (ref 3.87–5.11)
RDW: 16.6 % — ABNORMAL HIGH (ref 11.5–15.5)

## 2013-08-09 LAB — LIPASE, BLOOD: Lipase: 30 U/L (ref 11–59)

## 2013-08-09 MED ORDER — SODIUM CHLORIDE 0.9 % IV BOLUS (SEPSIS)
500.0000 mL | INTRAVENOUS | Status: AC
  Administered 2013-08-10: 500 mL via INTRAVENOUS

## 2013-08-09 NOTE — ED Notes (Signed)
Pt. reports persistent pain across abdomen and lower back for several days with nausea , seen here 2 days ago for the same complaints , blood test / urinalysis done - discharge home . Denies fever or chills.

## 2013-08-09 NOTE — ED Notes (Signed)
Attempted IV access, second RN to look.

## 2013-08-09 NOTE — ED Notes (Signed)
MD at bedside. 

## 2013-08-09 NOTE — ED Provider Notes (Signed)
CSN: [id number]     Arrival date & time 08/09/13  2010 History   First MD Initiated Contact with Patient 08/09/13 2258     Chief Complaint  Patient presents with  . Abdominal Pain   (Consider location/radiation/quality/duration/timing/severity/associated sxs/prior Treatment) Patient is a 72 y.o. female presenting with abdominal pain. The history is provided by the patient.  Abdominal Pain Pain location:  Periumbilical Pain quality comment:  Sharp Pain radiation: low back. Pain severity:  Mild Onset quality:  Gradual Duration:  2 weeks Timing:  Intermittent Progression:  Unchanged Chronicity:  New Relieved by:  Nothing Worsened by:  Nothing tried Ineffective treatments:  None tried Associated symptoms: vomiting (once)   Associated symptoms: no chest pain, no cough, no diarrhea, no dysuria, no fatigue, no fever, no hematuria, no nausea and no shortness of breath     Past Medical History  Diagnosis Date  . Diabetes mellitus without complication   . Hypertension   . Stroke    History reviewed. No pertinent past surgical history. No family history on file. History  Substance Use Topics  . Smoking status: Never Smoker   . Smokeless tobacco: Not on file  . Alcohol Use: No   OB History   Grav Para Term Preterm Abortions TAB SAB Ect Mult Living                 Review of Systems  Constitutional: Negative for fever and fatigue.  HENT: Negative for congestion and drooling.   Eyes: Negative for pain.  Respiratory: Negative for cough and shortness of breath.   Cardiovascular: Negative for chest pain.  Gastrointestinal: Positive for vomiting (once) and abdominal pain. Negative for nausea and diarrhea.  Genitourinary: Negative for dysuria and hematuria.  Musculoskeletal: Negative for back pain, gait problem and neck pain.  Skin: Negative for color change.  Neurological: Negative for dizziness and headaches.  Hematological: Negative for adenopathy.  Psychiatric/Behavioral:  Negative for behavioral problems.  All other systems reviewed and are negative.    Allergies  Review of patient's allergies indicates no known allergies.  Home Medications   Current Outpatient Rx  Name  Route  Sig  Dispense  Refill  . acetaminophen (TYLENOL) 325 MG tablet   Oral   Take 650 mg by mouth every 6 (six) hours as needed for pain.         Marland Kitchen buPROPion (WELLBUTRIN XL) 150 MG 24 hr tablet   Oral   Take 150 mg by mouth daily.         . clopidogrel (PLAVIX) 75 MG tablet   Oral   Take 75 mg by mouth every evening.         . loratadine (CLARITIN) 10 MG tablet   Oral   Take 10 mg by mouth daily.         . metFORMIN (GLUCOPHAGE) 500 MG tablet   Oral   Take 1,000 mg by mouth 2 (two) times daily with a meal.          . ondansetron (ZOFRAN) 4 MG tablet   Oral   Take 1 tablet (4 mg total) by mouth every 8 (eight) hours as needed for nausea or vomiting.   10 tablet   0   . PARoxetine (PAXIL) 40 MG tablet   Oral   Take 40 mg by mouth every morning.         Marland Kitchen spironolactone (ALDACTONE) 25 MG tablet   Oral   Take 25 mg by mouth 2 (two) times daily.  BP 131/86  Pulse 51  Temp(Src) 97.6 F (36.4 C) (Oral)  Resp 16  Ht 5\' 6"  (1.676 m)  Wt 210 lb (95.255 kg)  BMI 33.91 kg/m2  SpO2 95% Physical Exam  Nursing note and vitals reviewed. Constitutional: She is oriented to person, place, and time. She appears well-developed and well-nourished.  HENT:  Head: Normocephalic.  Mouth/Throat: Oropharynx is clear and moist. No oropharyngeal exudate.  Eyes: Conjunctivae and EOM are normal. Pupils are equal, round, and reactive to light.  Neck: Normal range of motion. Neck supple.  Cardiovascular: Normal rate, regular rhythm, normal heart sounds and intact distal pulses.  Exam reveals no gallop and no friction rub.   No murmur heard. Pulmonary/Chest: Effort normal and breath sounds normal. No respiratory distress. She has no wheezes.  Abdominal: Soft.  Bowel sounds are normal. There is tenderness (mild periumbilical ttp). There is no rebound and no guarding.  Musculoskeletal: Normal range of motion. She exhibits no edema and no tenderness.  Neurological: She is alert and oriented to person, place, and time.  Skin: Skin is warm and dry.  Psychiatric: She has a normal mood and affect. Her behavior is normal.    ED Course  Procedures (including critical care time) Labs Review Labs Reviewed  CBC WITH DIFFERENTIAL - Abnormal; Notable for the following:    WBC 11.5 (*)    Hemoglobin 11.7 (*)    HCT 34.3 (*)    RDW 16.6 (*)    Neutrophils Relative % 83 (*)    Neutro Abs 9.5 (*)    Lymphocytes Relative 10 (*)    All other components within normal limits  COMPREHENSIVE METABOLIC PANEL - Abnormal; Notable for the following:    Glucose, Bld 102 (*)    AST 86 (*)    ALT 141 (*)    Alkaline Phosphatase 244 (*)    Total Bilirubin 3.2 (*)    GFR calc non Af Amer 64 (*)    GFR calc Af Amer 74 (*)    All other components within normal limits  URINALYSIS W MICROSCOPIC + REFLEX CULTURE - Abnormal; Notable for the following:    Color, Urine AMBER (*)    APPearance CLOUDY (*)    Bilirubin Urine MODERATE (*)    Ketones, ur 40 (*)    Urobilinogen, UA >8.0 (*)    Leukocytes, UA MODERATE (*)    Squamous Epithelial / LPF FEW (*)    All other components within normal limits  URINE CULTURE  LIPASE, BLOOD   Imaging Review Ct Abdomen Pelvis W Contrast  08/10/2013   CLINICAL DATA:  Mid abdominal pain for several weeks. Periumbilical pain.  EXAM: CT ABDOMEN AND PELVIS WITH CONTRAST  TECHNIQUE: Multidetector CT imaging of the abdomen and pelvis was performed using the standard protocol following bolus administration of intravenous contrast.  CONTRAST:  OMNIPAQUE IOHEXOL 300 MG/ML  SOLN  COMPARISON:  12/23/2008  FINDINGS: Lung bases are clear allowing for motion artifact. Streak artifact from the arms limits visualization of some areas.  Low-attenuation lesions in the liver are with anterior segment right lobe of liver lesion measuring 9 mm and inferior right lobe of liver lesion 2.8 cm. These are likely cysts and were present previously without change.  The gallbladder is mildly distended and filled with sludge. The gallbladder wall is diffusely thickened and edematous with pericholecystic stranding. Changes are consistent with acute cholecystitis. No bile duct dilatation. The spleen, pancreas, adrenal glands, kidneys, abdominal aorta, inferior vena cava, and retroperitoneal lymph  nodes are unremarkable. The stomach, small bowel, and colon are not abnormally distended. Contrast material flows to the distal colon without evidence of obstruction. No free air or free fluid in the abdomen.  Pelvis: Bladder wall is not thickened. Uterus and ovaries are not enlarged. Appendix is normal. No evidence of diverticulitis. No free or loculated pelvic fluid collections. Small umbilical hernia containing fat. Degenerative changes in the lumbar spine. Spondylolysis with mild spondylolisthesis at L4-5. No destructive bone lesions appreciated.  IMPRESSION: Thick edematous gallbladder wall with gallbladder distention and sludge and pericholecystic inflammatory stranding. Changes are consistent with acute cholecystitis.   Electronically Signed   By: Burman Nieves M.D.   On: 08/10/2013 03:15    EKG Interpretation   None       MDM   1. Cholecystitis    11:10 PM 72 y.o. female who presents with intermittent periumbilical abdominal pain which radiates to her back for the last 2 weeks. The patient was seen here 2 days ago with similar symptoms. She had noncontributory labwork and urinalysis at that time. She is afebrile and vital signs are unremarkable here. She denies any diarrhea or fever at home. She has had one episode of emesis 2 days ago. Will get screening labwork and CT scan. The patient is currently asymptomatic on exam with a benign  abdomen.  Elevated liver enzymes. CT shows cholecystitis. I spoke w/ Dr. Carolynne Edouard ([facility]) who will see the pt on the floor. Will admit to triad.     Junius Argyle, MD 08/10/13 [phone]

## 2013-08-09 NOTE — ED Provider Notes (Signed)
I saw and evaluated the patient, reviewed the resident's note and I agree with the findings and plan.  EKG Interpretation   None       Pt present with lower back pain which could be musculoskeletal vs UTI in elderly lady.  Pt is able to ambulate with 1 episode of vomiting here however she denies SOB, chest pain, abd pain and has been eating but does c/o of intermittent bloating.  Pt o/w well appearing with improvement in HR.  UA neg and pt d/ced home to f/u with PCP  Gwyneth Sprout, MD 08/09/13 [phone]

## 2013-08-10 ENCOUNTER — Encounter (HOSPITAL_COMMUNITY): Payer: Self-pay | Admitting: Internal Medicine

## 2013-08-10 ENCOUNTER — Emergency Department (HOSPITAL_COMMUNITY): Payer: Medicare Other

## 2013-08-10 DIAGNOSIS — I10 Essential (primary) hypertension: Secondary | ICD-10-CM

## 2013-08-10 DIAGNOSIS — K8020 Calculus of gallbladder without cholecystitis without obstruction: Secondary | ICD-10-CM

## 2013-08-10 DIAGNOSIS — Z8673 Personal history of transient ischemic attack (TIA), and cerebral infarction without residual deficits: Secondary | ICD-10-CM

## 2013-08-10 DIAGNOSIS — K810 Acute cholecystitis: Secondary | ICD-10-CM

## 2013-08-10 DIAGNOSIS — E119 Type 2 diabetes mellitus without complications: Secondary | ICD-10-CM

## 2013-08-10 HISTORY — DX: Acute cholecystitis: K81.0

## 2013-08-10 LAB — URINALYSIS W MICROSCOPIC + REFLEX CULTURE
Hgb urine dipstick: NEGATIVE
Nitrite: NEGATIVE
Specific Gravity, Urine: 1.025 (ref 1.005–1.030)
Urobilinogen, UA: >8 mg/dL — ABNORMAL HIGH (ref 0.0–1.0)
pH: 5.5 (ref 5.0–8.0)

## 2013-08-10 LAB — GLUCOSE, CAPILLARY
Glucose-Capillary: 91 mg/dL (ref 70–99)
Glucose-Capillary: 111 mg/dL — ABNORMAL HIGH (ref 70–99)

## 2013-08-10 LAB — CREATININE, SERUM
Creatinine, Ser: 0.81 mg/dL (ref 0.50–1.10)
GFR calc non Af Amer: 71 mL/min — ABNORMAL LOW (ref >90)

## 2013-08-10 LAB — CBC
HCT: 32.6 % — ABNORMAL LOW (ref 36.0–46.0)
MCH: 28.9 pg (ref 26.0–34.0)
MCHC: 34 g/dL (ref 30.0–36.0)
MCV: 84.9 fL (ref 78.0–100.0)
Platelets: 323 10*3/uL (ref 150–400)
RDW: 16.6 % — ABNORMAL HIGH (ref 11.5–15.5)
WBC: 9.1 10*3/uL (ref 4.0–10.5)

## 2013-08-10 MED ORDER — HYDROMORPHONE HCL PF 1 MG/ML IJ SOLN
1.0000 mg | INTRAMUSCULAR | Status: DC | PRN
  Administered 2013-08-10 – 2013-08-13 (×2): 1 mg via INTRAVENOUS
  Filled 2013-08-10 (×2): qty 1

## 2013-08-10 MED ORDER — DEXTROSE 5 % IV SOLN
1.0000 g | Freq: Once | INTRAVENOUS | Status: AC
  Administered 2013-08-10: 1 g via INTRAVENOUS
  Filled 2013-08-10: qty 1

## 2013-08-10 MED ORDER — IOHEXOL 300 MG/ML  SOLN
100.0000 mL | Freq: Once | INTRAMUSCULAR | Status: AC | PRN
  Administered 2013-08-10: 100 mL via INTRAVENOUS

## 2013-08-10 MED ORDER — BUPROPION HCL ER (XL) 150 MG PO TB24
150.0000 mg | ORAL_TABLET | Freq: Every day | ORAL | Status: DC
  Administered 2013-08-10 – 2013-08-14 (×4): 150 mg via ORAL
  Filled 2013-08-10 (×5): qty 1

## 2013-08-10 MED ORDER — HEPARIN SODIUM (PORCINE) 5000 UNIT/ML IJ SOLN
5000.0000 [IU] | Freq: Three times a day (TID) | INTRAMUSCULAR | Status: DC
  Administered 2013-08-10 – 2013-08-12 (×6): 5000 [IU] via SUBCUTANEOUS
  Filled 2013-08-10 (×13): qty 1

## 2013-08-10 MED ORDER — DEXTROSE 5 % IV SOLN
1.0000 g | Freq: Three times a day (TID) | INTRAVENOUS | Status: DC
  Filled 2013-08-10 (×2): qty 1

## 2013-08-10 MED ORDER — ONDANSETRON HCL 4 MG PO TABS: 4.0000 mg | ORAL_TABLET | Freq: Four times a day (QID) | ORAL | Status: DC | PRN

## 2013-08-10 MED ORDER — ONDANSETRON HCL 4 MG/2ML IJ SOLN: 4.0000 mg | Freq: Four times a day (QID) | INTRAMUSCULAR | Status: DC | PRN

## 2013-08-10 MED ORDER — ACETAMINOPHEN 325 MG PO TABS
650.0000 mg | ORAL_TABLET | Freq: Once | ORAL | Status: AC
  Administered 2013-08-10: 650 mg via ORAL
  Filled 2013-08-10: qty 2

## 2013-08-10 MED ORDER — ACETAMINOPHEN 325 MG PO TABS
650.0000 mg | ORAL_TABLET | Freq: Four times a day (QID) | ORAL | Status: DC | PRN
Start: 2013-08-10 — End: 2013-08-14
  Administered 2013-08-10 – 2013-08-14 (×4): 650 mg via ORAL
  Filled 2013-08-10 (×4): qty 2

## 2013-08-10 MED ORDER — PIPERACILLIN-TAZOBACTAM 3.375 G IVPB
3.3750 g | Freq: Three times a day (TID) | INTRAVENOUS | Status: DC
  Administered 2013-08-10 – 2013-08-13 (×9): 3.375 g via INTRAVENOUS
  Administered 2013-08-13: 3.375 mg via INTRAVENOUS
  Administered 2013-08-14: 3.375 g via INTRAVENOUS
  Filled 2013-08-10 (×14): qty 50

## 2013-08-10 MED ORDER — DEXTROSE-NACL 5-0.9 % IV SOLN
INTRAVENOUS | Status: DC
  Administered 2013-08-10 – 2013-08-12 (×5): via INTRAVENOUS

## 2013-08-10 MED ORDER — INSULIN ASPART 100 UNIT/ML ~~LOC~~ SOLN
0.0000 [IU] | SUBCUTANEOUS | Status: DC
  Administered 2013-08-11: 2 [IU] via SUBCUTANEOUS
  Administered 2013-08-12: 3 [IU] via SUBCUTANEOUS
  Administered 2013-08-14: 2 [IU] via SUBCUTANEOUS

## 2013-08-10 MED ORDER — PIPERACILLIN-TAZOBACTAM 3.375 G IVPB 30 MIN: 3.3750 g | Freq: Three times a day (TID) | INTRAVENOUS | Status: DC

## 2013-08-10 MED ORDER — HALOPERIDOL LACTATE 5 MG/ML IJ SOLN
5.0000 mg | Freq: Four times a day (QID) | INTRAMUSCULAR | Status: DC | PRN
  Administered 2013-08-10: 5 mg via INTRAVENOUS
  Filled 2013-08-10: qty 1

## 2013-08-10 MED ORDER — PAROXETINE HCL 20 MG PO TABS
40.0000 mg | ORAL_TABLET | Freq: Every day | ORAL | Status: DC
  Administered 2013-08-10 – 2013-08-14 (×4): 40 mg via ORAL
  Filled 2013-08-10 (×6): qty 2

## 2013-08-10 NOTE — ED Notes (Signed)
Family at bedside. 

## 2013-08-10 NOTE — ED Notes (Signed)
Pt with soft abdomen and hyperactive bowel sounds

## 2013-08-10 NOTE — H&P (Signed)
[facility] History and Physical  Tracy [name] NWG:[id number] DOB: 07/13/1941    PCP:   Geraldo Pitter, MD   Chief Complaint: abdominal pain.  HPI: Tracy [name] is an 72 y.o. female with hx of DM on oral hypoglycemic agent, HTN, prior CVA with minimal right sided paresis, returned to the ER with persistent epigastric, RUQ pain with back radiation, nausea, vomiting, with mild subjective fever.  Evalaution included a mild LFT elevation with AST 86 ALT 144 and AP of 244.  Her WBC was only 12K, with Hb of 12.5 g per dL, with normal lipase.  She described her pain starting about 20 min after eating lasting about 30 minutes.  Her abdominal pelvic CT showed sludge in the gallbladder, with stranding, but no discrete stone and no dilated duct.  Her BS is controlled and her renal tests were normal.  Surgery consulted by EDP and will see her in consultation, thinking that she may benefit from transcutaneous drainage tube, and hospitalist was asked to admit her due to multiple concomitant medical problems.    Rewiew of Systems:  Constitutional: Negative for malaise, fever and chills. No significant weight loss or weight gain Eyes: Negative for eye pain, redness and discharge, diplopia, visual changes, or flashes of light. ENMT: Negative for ear pain, hoarseness, nasal congestion, sinus pressure and sore throat. No headaches; tinnitus, drooling, or problem swallowing. Cardiovascular: Negative for chest pain, palpitations, diaphoresis, dyspnea and peripheral edema. ; No orthopnea, PND Respiratory: Negative for cough, hemoptysis, wheezing and stridor. No pleuritic chestpain. Gastrointestinal: Negative for  diarrhea, constipation,  melena, blood in stool, hematemesis, jaundice and rectal bleeding.    Genitourinary: Negative for frequency, dysuria, incontinence,flank pain and hematuria; Musculoskeletal: Negative for back pain and neck pain. Negative for swelling and trauma.;  Skin: . Negative for  pruritus, rash, abrasions, bruising and skin lesion.; ulcerations Neuro: Negative for headache, lightheadedness and neck stiffness. Negative for weakness, altered level of consciousness , altered mental status, extremity weakness, burning feet, involuntary movement, seizure and syncope.  Psych: negative for anxiety, depression, insomnia, tearfulness, panic attacks, hallucinations, paranoia, suicidal or homicidal ideation    Past Medical History  Diagnosis Date  . Diabetes mellitus without complication   . Hypertension   . Stroke     History reviewed. No pertinent past surgical history.  Medications:  HOME MEDS: Prior to Admission medications   Medication Sig Start Date End Date Taking? Authorizing Provider  acetaminophen (TYLENOL) 325 MG tablet Take 650 mg by mouth every 6 (six) hours as needed for pain.   Yes Historical Provider, MD  buPROPion (WELLBUTRIN XL) 150 MG 24 hr tablet Take 150 mg by mouth daily.   Yes Historical Provider, MD  clopidogrel (PLAVIX) 75 MG tablet Take 75 mg by mouth every evening.   Yes Historical Provider, MD  loratadine (CLARITIN) 10 MG tablet Take 10 mg by mouth daily.   Yes Historical Provider, MD  metFORMIN (GLUCOPHAGE) 500 MG tablet Take 1,000 mg by mouth 2 (two) times daily with a meal.    Yes Historical Provider, MD  ondansetron (ZOFRAN) 4 MG tablet Take 1 tablet (4 mg total) by mouth every 8 (eight) hours as needed for nausea or vomiting. 08/08/13  Yes Bridgett Larsson, MD  PARoxetine (PAXIL) 40 MG tablet Take 40 mg by mouth every morning.   Yes Historical Provider, MD  spironolactone (ALDACTONE) 25 MG tablet Take 25 mg by mouth 2 (two) times daily.   Yes Historical Provider, MD     Allergies:  No Known Allergies  Social History:   reports that she has never smoked. She does not have any smokeless tobacco history on file. She reports that she does not drink alcohol or use illicit drugs.  Family History: No family history on file.   Physical  Exam: Filed Vitals:   08/10/13 0230 08/10/13 0351 08/10/13 0400 08/10/13 0500  BP: 112/63 106/63 121/71 117/72  Pulse:  98 103 106  Temp:      TempSrc:      Resp:  18    Height:      Weight:      SpO2:  97% 100% 98%   Blood pressure 117/72, pulse 106, temperature 97.6 F (36.4 C), temperature source Oral, resp. rate 18, height 5\' 6"  (1.676 m), weight 95.255 kg (210 lb), SpO2 98.00%.  GEN:  Pleasant  patient lying in the stretcher in no acute distress; cooperative with exam. PSYCH:  alert and oriented x4; does not appear anxious or depressed; affect is appropriate. HEENT: Mucous membranes pink and anicteric; PERRLA; EOM intact; no cervical lymphadenopathy nor thyromegaly or carotid bruit; no JVD; There were no stridor. Neck is very supple. Breasts:: Not examined CHEST WALL: No tenderness CHEST: Normal respiration, clear to auscultation bilaterally.  HEART: Regular rate and rhythm.  There are no murmur, rub, or gallops.   BACK: No kyphosis or scoliosis; no CVA tenderness ABDOMEN: soft and but tender over the RUQ pain. no masses, no organomegaly, normal abdominal bowel sounds; no pannus; no intertriginous candida. There is no rebound and no distention. Rectal Exam: Not done EXTREMITIES: No bone or joint deformity; age-appropriate arthropathy of the hands and knees; no edema; no ulcerations.  There is no calf tenderness. Genitalia: not examined PULSES: 2+ and symmetric SKIN: Normal hydration no rash or ulceration CNS: Cranial nerves 2-12 grossly intact no focal lateralizing neurologic deficit.  Speech is fluent; uvula elevated with phonation, facial symmetry and tongue midline. DTR are normal bilaterally, cerebella exam is intact, barbinski is negative and strengths are equaled bilaterally.  No sensory loss.   Labs on Admission:  Basic Metabolic Panel:  Recent Labs Lab 08/07/13 2219 08/09/13 2315  NA 136 135  K 3.5 3.6  CL 98 97  CO2 27 24  GLUCOSE 143* 102*  BUN 18 18   CREATININE 0.88 0.88  CALCIUM 9.3 9.0   Liver Function Tests:  Recent Labs Lab 08/09/13 2315  AST 86*  ALT 141*  ALKPHOS 244*  BILITOT 3.2*  PROT 7.3  ALBUMIN 3.6    Recent Labs Lab 08/09/13 2315  LIPASE 30   No results found for this basename: AMMONIA,  in the last 168 hours CBC:  Recent Labs Lab 08/07/13 2219 08/09/13 2315  WBC 11.7* 11.5*  NEUTROABS 9.4* 9.5*  HGB 12.5 11.7*  HCT 37.1 34.3*  MCV 86.1 85.8  PLT 359 287   Cardiac Enzymes: No results found for this basename: CKTOTAL, CKMB, CKMBINDEX, TROPONINI,  in the last 168 hours  CBG:  Recent Labs Lab 08/07/13 2237  GLUCAP 137*     Radiological Exams on Admission: Ct Abdomen Pelvis W Contrast  08/10/2013   CLINICAL DATA:  Mid abdominal pain for several weeks. Periumbilical pain.  EXAM: CT ABDOMEN AND PELVIS WITH CONTRAST  TECHNIQUE: Multidetector CT imaging of the abdomen and pelvis was performed using the standard protocol following bolus administration of intravenous contrast.  CONTRAST:  OMNIPAQUE IOHEXOL 300 MG/ML  SOLN  COMPARISON:  12/23/2008  FINDINGS: Lung bases are clear allowing for motion  artifact. Streak artifact from the arms limits visualization of some areas. Low-attenuation lesions in the liver are with anterior segment right lobe of liver lesion measuring 9 mm and inferior right lobe of liver lesion 2.8 cm. These are likely cysts and were present previously without change.  The gallbladder is mildly distended and filled with sludge. The gallbladder wall is diffusely thickened and edematous with pericholecystic stranding. Changes are consistent with acute cholecystitis. No bile duct dilatation. The spleen, pancreas, adrenal glands, kidneys, abdominal aorta, inferior vena cava, and retroperitoneal lymph nodes are unremarkable. The stomach, small bowel, and colon are not abnormally distended. Contrast material flows to the distal colon without evidence of obstruction. No free air or free  fluid in the abdomen.  Pelvis: Bladder wall is not thickened. Uterus and ovaries are not enlarged. Appendix is normal. No evidence of diverticulitis. No free or loculated pelvic fluid collections. Small umbilical hernia containing fat. Degenerative changes in the lumbar spine. Spondylolysis with mild spondylolisthesis at L4-5. No destructive bone lesions appreciated.  IMPRESSION: Thick edematous gallbladder wall with gallbladder distention and sludge and pericholecystic inflammatory stranding. Changes are consistent with acute cholecystitis.   Electronically Signed   By: Burman Nieves M.D.   On: 08/10/2013 03:15    Assessment/Plan Present on Admission:  . Acute cholecystitis . DM (diabetes mellitus) . HTN (hypertension) . Cholecystitis  PLAN:  Will admit her for acute cholecystitis with surgical consult to be seen shortly.  I will place her on NPO, give IVF, IV pain medication and Cefoxitin.  For her DM, will hold her metformin, and give SSI.  She is otherwise stable, full code, and will be admitted to [facility] service.  I discussed these plans with her and her family and answer all their questions at this time.  Thank you for asking me to participate in her care.  Other plans as per orders.  Code Status: FULL Unk Lightning, MD. [facility] Pager [phone] 7pm to 7am.  08/10/2013, 6:59 AM

## 2013-08-10 NOTE — ED Notes (Signed)
Attempted to start IV without success  

## 2013-08-10 NOTE — ED Notes (Signed)
Hospitalist to bedside and still awaiting surgery

## 2013-08-10 NOTE — Progress Notes (Signed)
Utilization Review Completed.Tracy [name] T11/30/2014  

## 2013-08-10 NOTE — Consult Note (Addendum)
Reason for Consult:abdominal pain Referring Physician: Dr Tracy [name] is an 72 y.o. female.  HPI: 89 yof who has dm/htn presents with 2 weeks of worsening initially intermittent ruq pain that is now persistent.  This is associated with back pain. She has had some nausea and vomiting. Denies any changes consistent with hyperbilirubinemia.  Having bms.  No fevers.  No prior surgical history.  Past Medical History  Diagnosis Date  . Diabetes mellitus without complication   . Hypertension   . Stroke     History reviewed. No pertinent past surgical history.  No family history on file.  Social History:  reports that she has never smoked. She does not have any smokeless tobacco history on file. She reports that she does not drink alcohol or use illicit drugs.  Allergies: No Known Allergies  Medications: I have reviewed the patient's current medications.  Results for orders placed during the hospital encounter of 08/09/13 (from the past 48 hour(s))  CBC WITH DIFFERENTIAL     Status: Abnormal   Collection Time    08/09/13 11:15 PM      Result Value Range   WBC 11.5 (*) 4.0 - 10.5 K/uL   RBC 4.00  3.87 - 5.11 MIL/uL   Hemoglobin 11.7 (*) 12.0 - 15.0 g/dL   HCT 16.1 (*) 09.6 - 04.5 %   MCV 85.8  78.0 - 100.0 fL   MCH 29.3  26.0 - 34.0 pg   MCHC 34.1  30.0 - 36.0 g/dL   RDW 40.9 (*) 81.1 - 91.4 %   Platelets 287  150 - 400 K/uL   Neutrophils Relative % 83 (*) 43 - 77 %   Neutro Abs 9.5 (*) 1.7 - 7.7 K/uL   Lymphocytes Relative 10 (*) 12 - 46 %   Lymphs Abs 1.1  0.7 - 4.0 K/uL   Monocytes Relative 6  3 - 12 %   Monocytes Absolute 0.7  0.1 - 1.0 K/uL   Eosinophils Relative 1  0 - 5 %   Eosinophils Absolute 0.1  0.0 - 0.7 K/uL   Basophils Relative 0  0 - 1 %   Basophils Absolute 0.0  0.0 - 0.1 K/uL  COMPREHENSIVE METABOLIC PANEL     Status: Abnormal   Collection Time    08/09/13 11:15 PM      Result Value Range   Sodium 135  135 - 145 mEq/L   Potassium 3.6  3.5 -  5.1 mEq/L   Chloride 97  96 - 112 mEq/L   CO2 24  19 - 32 mEq/L   Glucose, Bld 102 (*) 70 - 99 mg/dL   BUN 18  6 - 23 mg/dL   Creatinine, Ser 7.82  0.50 - 1.10 mg/dL   Calcium 9.0  8.4 - 95.6 mg/dL   Total Protein 7.3  6.0 - 8.3 g/dL   Albumin 3.6  3.5 - 5.2 g/dL   AST 86 (*) 0 - 37 U/L   ALT 141 (*) 0 - 35 U/L   Alkaline Phosphatase 244 (*) 39 - 117 U/L   Total Bilirubin 3.2 (*) 0.3 - 1.2 mg/dL   GFR calc non Af Amer 64 (*) >90 mL/min   GFR calc Af Amer 74 (*) >90 mL/min   Comment: (NOTE)     The eGFR has been calculated using the CKD EPI equation.     This calculation has not been validated in all clinical situations.     eGFR's persistently <90  mL/min signify possible Chronic Kidney     Disease.  LIPASE, BLOOD     Status: None   Collection Time    08/09/13 11:15 PM      Result Value Range   Lipase 30  11 - 59 U/L  URINALYSIS W MICROSCOPIC + REFLEX CULTURE     Status: Abnormal   Collection Time    08/10/13  3:43 AM      Result Value Range   Color, Urine AMBER (*) YELLOW   Comment: BIOCHEMICALS MAY BE AFFECTED BY COLOR   APPearance CLOUDY (*) CLEAR   Specific Gravity, Urine 1.025  1.005 - 1.030   pH 5.5  5.0 - 8.0   Glucose, UA NEGATIVE  NEGATIVE mg/dL   Hgb urine dipstick NEGATIVE  NEGATIVE   Bilirubin Urine MODERATE (*) NEGATIVE   Ketones, ur 40 (*) NEGATIVE mg/dL   Protein, ur NEGATIVE  NEGATIVE mg/dL   Urobilinogen, UA >2.9 (*) 0.0 - 1.0 mg/dL   Nitrite NEGATIVE  NEGATIVE   Leukocytes, UA MODERATE (*) NEGATIVE   WBC, UA 11-20  <3 WBC/hpf   Bacteria, UA RARE  RARE   Squamous Epithelial / LPF FEW (*) RARE  CREATININE, SERUM     Status: Abnormal   Collection Time    08/10/13  7:02 AM      Result Value Range   Creatinine, Ser 0.81  0.50 - 1.10 mg/dL   GFR calc non Af Amer 71 (*) >90 mL/min   GFR calc Af Amer 82 (*) >90 mL/min   Comment: (NOTE)     The eGFR has been calculated using the CKD EPI equation.     This calculation has not been validated in all clinical  situations.     eGFR's persistently <90 mL/min signify possible Chronic Kidney     Disease.  CBC     Status: Abnormal   Collection Time    08/10/13  7:02 AM      Result Value Range   WBC 9.1  4.0 - 10.5 K/uL   RBC 3.84 (*) 3.87 - 5.11 MIL/uL   Hemoglobin 11.1 (*) 12.0 - 15.0 g/dL   HCT 56.2 (*) 13.0 - 86.5 %   MCV 84.9  78.0 - 100.0 fL   MCH 28.9  26.0 - 34.0 pg   MCHC 34.0  30.0 - 36.0 g/dL   RDW 78.4 (*) 69.6 - 29.5 %   Platelets 323  150 - 400 K/uL  GLUCOSE, CAPILLARY     Status: None   Collection Time    08/10/13  7:30 AM      Result Value Range   Glucose-Capillary 82  70 - 99 mg/dL  GLUCOSE, CAPILLARY     Status: None   Collection Time    08/10/13  8:30 AM      Result Value Range   Glucose-Capillary 70  70 - 99 mg/dL   Comment 1 Documented in Chart     Comment 2 Notify RN    GLUCOSE, CAPILLARY     Status: None   Collection Time    08/10/13 11:52 AM      Result Value Range   Glucose-Capillary 70  70 - 99 mg/dL   Comment 1 Documented in Chart     Comment 2 Notify RN      Ct Abdomen Pelvis W Contrast  08/10/2013   CLINICAL DATA:  Mid abdominal pain for several weeks. Periumbilical pain.  EXAM: CT ABDOMEN AND PELVIS WITH CONTRAST  TECHNIQUE: Multidetector CT  imaging of the abdomen and pelvis was performed using the standard protocol following bolus administration of intravenous contrast.  CONTRAST:  OMNIPAQUE IOHEXOL 300 MG/ML  SOLN  COMPARISON:  12/23/2008  FINDINGS: Lung bases are clear allowing for motion artifact. Streak artifact from the arms limits visualization of some areas. Low-attenuation lesions in the liver are with anterior segment right lobe of liver lesion measuring 9 mm and inferior right lobe of liver lesion 2.8 cm. These are likely cysts and were present previously without change.  The gallbladder is mildly distended and filled with sludge. The gallbladder wall is diffusely thickened and edematous with pericholecystic stranding. Changes are consistent  with acute cholecystitis. No bile duct dilatation. The spleen, pancreas, adrenal glands, kidneys, abdominal aorta, inferior vena cava, and retroperitoneal lymph nodes are unremarkable. The stomach, small bowel, and colon are not abnormally distended. Contrast material flows to the distal colon without evidence of obstruction. No free air or free fluid in the abdomen.  Pelvis: Bladder wall is not thickened. Uterus and ovaries are not enlarged. Appendix is normal. No evidence of diverticulitis. No free or loculated pelvic fluid collections. Small umbilical hernia containing fat. Degenerative changes in the lumbar spine. Spondylolysis with mild spondylolisthesis at L4-5. No destructive bone lesions appreciated.  IMPRESSION: Thick edematous gallbladder wall with gallbladder distention and sludge and pericholecystic inflammatory stranding. Changes are consistent with acute cholecystitis.   Electronically Signed   By: Burman Nieves M.D.   On: 08/10/2013 03:15    Review of Systems  Constitutional: Positive for malaise/fatigue. Negative for fever and chills.  Respiratory: Negative for shortness of breath.   Cardiovascular: Negative for chest pain.  Gastrointestinal: Positive for nausea, vomiting and abdominal pain. Negative for diarrhea, constipation and blood in stool.  Musculoskeletal: Positive for back pain.   Blood pressure 105/68, pulse 103, temperature 98.2 F (36.8 C), temperature source Oral, resp. rate 18, height 5\' 6"  (1.676 m), weight 208 lb 12.4 oz (94.7 kg), SpO2 93.00%. Physical Exam  Vitals reviewed. Constitutional: She appears well-developed and well-nourished.  Eyes: No scleral icterus.  Neck: Neck supple.  Cardiovascular: Normal rate, regular rhythm and normal heart sounds.   Respiratory: Effort normal and breath sounds normal. She has no wheezes. She has no rales.  GI: Soft. Bowel sounds are normal. She exhibits no distension. There is tenderness in the right upper quadrant and  epigastric area. No hernia.  Lymphadenopathy:    She has no cervical adenopathy.  Skin: She is not diaphoretic.    Assessment/Plan: Cholecystitis, possible choledocholithiasis  Due to elevated lfts I think rechecking in am would be reasonable and mrcp today/tonight also.  If stone present, lfts still up then I think gi consultation indicated for ercp.  If negative then will discuss lap chole tomorrow. She has also been on plavix for a stroke in past but did not tell me this when I saw her.  She was not forthcoming about medical problems and joked around about them.  May need to wait for surgery due to plavix. Tracy [name] 08/10/2013, 2:24 PM

## 2013-08-10 NOTE — ED Notes (Signed)
Tracy [name] advised of blood sugar-82

## 2013-08-10 NOTE — ED Notes (Signed)
Pt resting and family informed that we are waiting on the surgeon to call back.  Pt only complains of headache.  Family at bedside

## 2013-08-10 NOTE — ED Notes (Signed)
IV team at bedside attempting to start IV.

## 2013-08-10 NOTE — Progress Notes (Signed)
[facility] PROGRESS NOTE  Tracy [name] WUJ:[id number] DOB: 1940/11/13 DOA: 08/09/2013 PCP: Tracy Pitter, MD  Assessment/Plan: Acute cholecystitis -Change antibiotics to IV Zosyn -Gen. surgery has been consulted -Continue IV fluids -Remain n.p.o. for now -CT abdomen shows gallbladder wall thickening, edema, and pericholecystic stranding without ductal dilatation Pyuria -Continue antibiotics pending culture data Leukocytosis -Secondary to UTI and cholecystitis -Improving with IV antibiotics Diabetes mellitus, type II -Hemoglobin A1c -NovoLog sliding scale History of stroke -Hold Plavix in anticipation for surgery Hypertension -Discontinue antihypertensive medications as the patient's blood pressure is soft   Family Communication:   Updated daughter Tracy [name] Disposition Plan:   Home when medically stable   Antibiotics:  mefoxin 08/10/13  Zosyn 08/10/13>>>    Procedures/Studies: Ct Abdomen Pelvis W Contrast  08/10/2013   CLINICAL DATA:  Mid abdominal pain for several weeks. Periumbilical pain.  EXAM: CT ABDOMEN AND PELVIS WITH CONTRAST  TECHNIQUE: Multidetector CT imaging of the abdomen and pelvis was performed using the standard protocol following bolus administration of intravenous contrast.  CONTRAST:  OMNIPAQUE IOHEXOL 300 MG/ML  SOLN  COMPARISON:  12/23/2008  FINDINGS: Lung bases are clear allowing for motion artifact. Streak artifact from the arms limits visualization of some areas. Low-attenuation lesions in the liver are with anterior segment right lobe of liver lesion measuring 9 mm and inferior right lobe of liver lesion 2.8 cm. These are likely cysts and were present previously without change.  The gallbladder is mildly distended and filled with sludge. The gallbladder wall is diffusely thickened and edematous with pericholecystic stranding. Changes are consistent with acute cholecystitis. No bile duct dilatation. The spleen, pancreas, adrenal glands,  kidneys, abdominal aorta, inferior vena cava, and retroperitoneal lymph nodes are unremarkable. The stomach, small bowel, and colon are not abnormally distended. Contrast material flows to the distal colon without evidence of obstruction. No free air or free fluid in the abdomen.  Pelvis: Bladder wall is not thickened. Uterus and ovaries are not enlarged. Appendix is normal. No evidence of diverticulitis. No free or loculated pelvic fluid collections. Small umbilical hernia containing fat. Degenerative changes in the lumbar spine. Spondylolysis with mild spondylolisthesis at L4-5. No destructive bone lesions appreciated.  IMPRESSION: Thick edematous gallbladder wall with gallbladder distention and sludge and pericholecystic inflammatory stranding. Changes are consistent with acute cholecystitis.   Electronically Signed   By: Burman Nieves M.D.   On: 08/10/2013 03:15         Subjective: This is his abdominal pain is a little bit better this morning. Denies nausea, vomiting, chest pain, shortness breath, headache. No fevers or chills. No dysuria.  Objective: Filed Vitals:   08/10/13 0400 08/10/13 0500 08/10/13 0730 08/10/13 0838  BP: 121/71 117/72 114/65 105/68  Pulse: 103 106 104 103  Temp:    98.2 F (36.8 C)  TempSrc:    Oral  Resp:   18 18  Height:   5\' 6"  (1.676 m)   Weight:   94.7 kg (208 lb 12.4 oz)   SpO2: 100% 98% 96% 93%    Intake/Output Summary (Last 24 hours) at 08/10/13 1122 Last data filed at 08/10/13 9562  Gross per 24 hour  Intake   1000 ml  Output      0 ml  Net   1000 ml   Weight change:  Exam:   General:  Pt is alert, follows commands appropriately, not in acute distress  HEENT: No icterus, No thrush, [place]/AT  Cardiovascular: RRR, S1/S2, no rubs, no gallops  Respiratory: CTA  bilaterally, no wheezing, no crackles, no rhonchi  Abdomen: Soft/+BS, RUQ tender without peritoneal sign, non distended, no guarding  Extremities: trace LE edema, No lymphangitis,  No petechiae, No rashes, no synovitis  Data Reviewed: Basic Metabolic Panel:  Recent Labs Lab 08/07/13 2219 08/09/13 2315 08/10/13 0702  NA 136 135  --   K 3.5 3.6  --   CL 98 97  --   CO2 27 24  --   GLUCOSE 143* 102*  --   BUN 18 18  --   CREATININE 0.88 0.88 0.81  CALCIUM 9.3 9.0  --    Liver Function Tests:  Recent Labs Lab 08/09/13 2315  AST 86*  ALT 141*  ALKPHOS 244*  BILITOT 3.2*  PROT 7.3  ALBUMIN 3.6    Recent Labs Lab 08/09/13 2315  LIPASE 30   No results found for this basename: AMMONIA,  in the last 168 hours CBC:  Recent Labs Lab 08/07/13 2219 08/09/13 2315 08/10/13 0702  WBC 11.7* 11.5* 9.1  NEUTROABS 9.4* 9.5*  --   HGB 12.5 11.7* 11.1*  HCT 37.1 34.3* 32.6*  MCV 86.1 85.8 84.9  PLT 359 287 323   Cardiac Enzymes: No results found for this basename: CKTOTAL, CKMB, CKMBINDEX, TROPONINI,  in the last 168 hours BNP: No components found with this basename: POCBNP,  CBG:  Recent Labs Lab 08/07/13 2237 08/10/13 0730 08/10/13 0830  GLUCAP 137* 82 70    No results found for this or any previous visit (from the past 240 hour(s)).   Scheduled Meds: . buPROPion  150 mg Oral Daily  . heparin  5,000 Units Subcutaneous Q8H  . insulin aspart  0-15 Units Subcutaneous Q4H  . PARoxetine  40 mg Oral Daily  . piperacillin-tazobactam  3.375 g Intravenous Q8H   Continuous Infusions: . dextrose 5 % and 0.9% NaCl 100 mL/hr at 08/10/13 0658     Lupe Bonner, DO  [facility] Pager [phone]  If 7PM-7AM, please contact night-coverage [email or web address] Password [email or web address] 08/10/2013, 11:22 AM   LOS: 1 day

## 2013-08-11 ENCOUNTER — Inpatient Hospital Stay (HOSPITAL_COMMUNITY): Payer: Medicare Other

## 2013-08-11 DIAGNOSIS — K8050 Calculus of bile duct without cholangitis or cholecystitis without obstruction: Secondary | ICD-10-CM | POA: Diagnosis present

## 2013-08-11 LAB — HEMOGLOBIN A1C
Hgb A1c MFr Bld: 6.4 % — ABNORMAL HIGH (ref <5.7)
Mean Plasma Glucose: 137 mg/dL — ABNORMAL HIGH (ref <117)

## 2013-08-11 LAB — GLUCOSE, CAPILLARY
Glucose-Capillary: 103 mg/dL — ABNORMAL HIGH (ref 70–99)
Glucose-Capillary: 104 mg/dL — ABNORMAL HIGH (ref 70–99)
Glucose-Capillary: 105 mg/dL — ABNORMAL HIGH (ref 70–99)
Glucose-Capillary: 109 mg/dL — ABNORMAL HIGH (ref 70–99)
Glucose-Capillary: 120 mg/dL — ABNORMAL HIGH (ref 70–99)
Glucose-Capillary: 122 mg/dL — ABNORMAL HIGH (ref 70–99)

## 2013-08-11 LAB — COMPREHENSIVE METABOLIC PANEL
AST: 70 U/L — ABNORMAL HIGH (ref 0–37)
CO2: 26 mEq/L (ref 19–32)
Calcium: 8.5 mg/dL (ref 8.4–10.5)
Chloride: 106 mEq/L (ref 96–112)
Creatinine, Ser: 0.78 mg/dL (ref 0.50–1.10)
GFR calc non Af Amer: 82 mL/min — ABNORMAL LOW (ref >90)
Sodium: 144 mEq/L (ref 135–145)
Total Bilirubin: 3.1 mg/dL — ABNORMAL HIGH (ref 0.3–1.2)
Total Protein: 6.5 g/dL (ref 6.0–8.3)

## 2013-08-11 LAB — CBC
MCH: 28.7 pg (ref 26.0–34.0)
MCHC: 33.2 g/dL (ref 30.0–36.0)
MCV: 86.4 fL (ref 78.0–100.0)
Platelets: 341 10*3/uL (ref 150–400)
RBC: 3.76 MIL/uL — ABNORMAL LOW (ref 3.87–5.11)
RDW: 16.8 % — ABNORMAL HIGH (ref 11.5–15.5)

## 2013-08-11 LAB — URINE CULTURE: Culture: NO GROWTH

## 2013-08-11 MED ORDER — INFLUENZA VAC SPLIT QUAD 0.5 ML IM SUSP
0.5000 mL | INTRAMUSCULAR | Status: AC
  Administered 2013-08-12: 0.5 mL via INTRAMUSCULAR
  Filled 2013-08-11: qty 0.5

## 2013-08-11 MED ORDER — PNEUMOCOCCAL VAC POLYVALENT 25 MCG/0.5ML IJ INJ
0.5000 mL | INJECTION | INTRAMUSCULAR | Status: AC
  Administered 2013-08-12: 0.5 mL via INTRAMUSCULAR
  Filled 2013-08-11: qty 0.5

## 2013-08-11 MED ORDER — SODIUM CHLORIDE 0.9 % IV SOLN
INTRAVENOUS | Status: DC
  Administered 2013-08-11: 23:00:00 via INTRAVENOUS

## 2013-08-11 MED ORDER — GADOBENATE DIMEGLUMINE 529 MG/ML IV SOLN
20.0000 mL | Freq: Once | INTRAVENOUS | Status: AC
  Administered 2013-08-11: 20 mL via INTRAVENOUS

## 2013-08-11 NOTE — Consult Note (Signed)
Unassigned patient Reason for Consult: CBD stones on MRCP-needs an ERCP. Referring Physician: THP. PCP: Dr. Geraldo Pitter.  Tracy [name] is an 72 y.o. female.  HPI: 72 year old black female, with multiple medical problems listed below, admitted with a 2 week history of epigastric pain radiating to her back; found to have acute cholecystitis on workup with 2 tiny CBD stones on an MRCP along with mild ductal dilatation. She had a stroke in 2010 and has been on Plavix since then but reportedly has not taken her Plavix in 4 days. She claims she has minimal abdominal pain at this time and the nausea and vomiting has improved as well. She claims her symptoms would come on postprandially and worse after greasy meals. She denies having any problems with constipation or diarrhea. She denies having any melena or hematochezia. She has some mild right sided weakness since her stroke.   Past Medical History  Diagnosis Date  . Diabetes mellitus without complication   . Hypertension   . Stroke    History reviewed. No pertinent past surgical history.  No family history on file.  Social History:  reports that she has never smoked. She does not have any smokeless tobacco history on file. She reports that she does not drink alcohol or use illicit drugs.  Allergies: No Known Allergies  Medications: I have reviewed the patient's current medications.  Results for orders placed during the hospital encounter of 08/09/13 (from the past 48 hour(s))  CBC WITH DIFFERENTIAL     Status: Abnormal   Collection Time    08/09/13 11:15 PM      Result Value Range   WBC 11.5 (*) 4.0 - 10.5 K/uL   RBC 4.00  3.87 - 5.11 MIL/uL   Hemoglobin 11.7 (*) 12.0 - 15.0 g/dL   HCT 16.1 (*) 09.6 - 04.5 %   MCV 85.8  78.0 - 100.0 fL   MCH 29.3  26.0 - 34.0 pg   MCHC 34.1  30.0 - 36.0 g/dL   RDW 40.9 (*) 81.1 - 91.4 %   Platelets 287  150 - 400 K/uL   Neutrophils Relative % 83 (*) 43 - 77 %   Neutro Abs 9.5 (*) 1.7 - 7.7  K/uL   Lymphocytes Relative 10 (*) 12 - 46 %   Lymphs Abs 1.1  0.7 - 4.0 K/uL   Monocytes Relative 6  3 - 12 %   Monocytes Absolute 0.7  0.1 - 1.0 K/uL   Eosinophils Relative 1  0 - 5 %   Eosinophils Absolute 0.1  0.0 - 0.7 K/uL   Basophils Relative 0  0 - 1 %   Basophils Absolute 0.0  0.0 - 0.1 K/uL  COMPREHENSIVE METABOLIC PANEL     Status: Abnormal   Collection Time    08/09/13 11:15 PM      Result Value Range   Sodium 135  135 - 145 mEq/L   Potassium 3.6  3.5 - 5.1 mEq/L   Chloride 97  96 - 112 mEq/L   CO2 24  19 - 32 mEq/L   Glucose, Bld 102 (*) 70 - 99 mg/dL   BUN 18  6 - 23 mg/dL   Creatinine, Ser 7.82  0.50 - 1.10 mg/dL   Calcium 9.0  8.4 - 95.6 mg/dL   Total Protein 7.3  6.0 - 8.3 g/dL   Albumin 3.6  3.5 - 5.2 g/dL   AST 86 (*) 0 - 37 U/L   ALT 141 (*)  0 - 35 U/L   Alkaline Phosphatase 244 (*) 39 - 117 U/L   Total Bilirubin 3.2 (*) 0.3 - 1.2 mg/dL   GFR calc non Af Amer 64 (*) >90 mL/min   GFR calc Af Amer 74 (*) >90 mL/min   Comment: (NOTE)     The eGFR has been calculated using the CKD EPI equation.     This calculation has not been validated in all clinical situations.     eGFR's persistently <90 mL/min signify possible Chronic Kidney     Disease.  LIPASE, BLOOD     Status: None   Collection Time    08/09/13 11:15 PM      Result Value Range   Lipase 30  11 - 59 U/L  URINALYSIS W MICROSCOPIC + REFLEX CULTURE     Status: Abnormal   Collection Time    08/10/13  3:43 AM      Result Value Range   Color, Urine AMBER (*) YELLOW   Comment: BIOCHEMICALS MAY BE AFFECTED BY COLOR   APPearance CLOUDY (*) CLEAR   Specific Gravity, Urine 1.025  1.005 - 1.030   pH 5.5  5.0 - 8.0   Glucose, UA NEGATIVE  NEGATIVE mg/dL   Hgb urine dipstick NEGATIVE  NEGATIVE   Bilirubin Urine MODERATE (*) NEGATIVE   Ketones, ur 40 (*) NEGATIVE mg/dL   Protein, ur NEGATIVE  NEGATIVE mg/dL   Urobilinogen, UA >4.0 (*) 0.0 - 1.0 mg/dL   Nitrite NEGATIVE  NEGATIVE   Leukocytes, UA  MODERATE (*) NEGATIVE   WBC, UA 11-20  <3 WBC/hpf   Bacteria, UA RARE  RARE   Squamous Epithelial / LPF FEW (*) RARE  URINE CULTURE     Status: None   Collection Time    08/10/13  3:43 AM      Result Value Range   Specimen Description URINE, RANDOM     Special Requests NONE     Culture  Setup Time       Value: 08/10/2013 13:20     Performed at [facility] Count       Value: NO GROWTH     Performed at [facility]   Culture       Value: NO GROWTH     Performed at [facility]   Report Status 08/11/2013 FINAL    CREATININE, SERUM     Status: Abnormal   Collection Time    08/10/13  7:02 AM      Result Value Range   Creatinine, Ser 0.81  0.50 - 1.10 mg/dL   GFR calc non Af Amer 71 (*) >90 mL/min   GFR calc Af Amer 82 (*) >90 mL/min   Comment: (NOTE)     The eGFR has been calculated using the CKD EPI equation.     This calculation has not been validated in all clinical situations.     eGFR's persistently <90 mL/min signify possible Chronic Kidney     Disease.  CBC     Status: Abnormal   Collection Time    08/10/13  7:02 AM      Result Value Range   WBC 9.1  4.0 - 10.5 K/uL   RBC 3.84 (*) 3.87 - 5.11 MIL/uL   Hemoglobin 11.1 (*) 12.0 - 15.0 g/dL   HCT 10.2 (*) 72.5 - 36.6 %   MCV 84.9  78.0 - 100.0 fL   MCH 28.9  26.0 - 34.0 pg   MCHC 34.0  30.0 - 36.0 g/dL   RDW 98.1 (*) 19.1 - 47.8 %   Platelets 323  150 - 400 K/uL  GLUCOSE, CAPILLARY     Status: None   Collection Time    08/10/13  7:30 AM      Result Value Range   Glucose-Capillary 82  70 - 99 mg/dL  GLUCOSE, CAPILLARY     Status: None   Collection Time    08/10/13  8:30 AM      Result Value Range   Glucose-Capillary 70  70 - 99 mg/dL   Comment 1 Documented in Chart     Comment 2 Notify RN    GLUCOSE, CAPILLARY     Status: None   Collection Time    08/10/13 11:52 AM      Result Value Range   Glucose-Capillary 70  70 - 99 mg/dL   Comment 1 Documented in Chart     Comment 2  Notify RN    GLUCOSE, CAPILLARY     Status: None   Collection Time    08/10/13  5:02 PM      Result Value Range   Glucose-Capillary 91  70 - 99 mg/dL   Comment 1 Documented in Chart     Comment 2 Notify RN    GLUCOSE, CAPILLARY     Status: Abnormal   Collection Time    08/10/13  8:28 PM      Result Value Range   Glucose-Capillary 111 (*) 70 - 99 mg/dL  GLUCOSE, CAPILLARY     Status: Abnormal   Collection Time    08/11/13 12:06 AM      Result Value Range   Glucose-Capillary 109 (*) 70 - 99 mg/dL  GLUCOSE, CAPILLARY     Status: Abnormal   Collection Time    08/11/13  4:38 AM      Result Value Range   Glucose-Capillary 132 (*) 70 - 99 mg/dL  CBC     Status: Abnormal   Collection Time    08/11/13  6:10 AM      Result Value Range   WBC 4.8  4.0 - 10.5 K/uL   RBC 3.76 (*) 3.87 - 5.11 MIL/uL   Hemoglobin 10.8 (*) 12.0 - 15.0 g/dL   HCT 29.5 (*) 62.1 - 30.8 %   MCV 86.4  78.0 - 100.0 fL   MCH 28.7  26.0 - 34.0 pg   MCHC 33.2  30.0 - 36.0 g/dL   RDW 65.7 (*) 84.6 - 96.2 %   Platelets 341  150 - 400 K/uL  COMPREHENSIVE METABOLIC PANEL     Status: Abnormal   Collection Time    08/11/13  6:10 AM      Result Value Range   Sodium 144  135 - 145 mEq/L   Potassium 3.2 (*) 3.5 - 5.1 mEq/L   Chloride 106  96 - 112 mEq/L   CO2 26  19 - 32 mEq/L   Glucose, Bld 130 (*) 70 - 99 mg/dL   BUN 8  6 - 23 mg/dL   Creatinine, Ser 9.52  0.50 - 1.10 mg/dL   Calcium 8.5  8.4 - 84.1 mg/dL   Total Protein 6.5  6.0 - 8.3 g/dL   Albumin 2.9 (*) 3.5 - 5.2 g/dL   AST 70 (*) 0 - 37 U/L   ALT 110 (*) 0 - 35 U/L   Alkaline Phosphatase 232 (*) 39 - 117 U/L   Total Bilirubin 3.1 (*) 0.3 - 1.2 mg/dL  GFR calc non Af Amer 82 (*) >90 mL/min   GFR calc Af Amer >90  >90 mL/min   Comment: (NOTE)     The eGFR has been calculated using the CKD EPI equation.     This calculation has not been validated in all clinical situations.     eGFR's persistently <90 mL/min signify possible Chronic Kidney     Disease.   HEMOGLOBIN A1C     Status: Abnormal   Collection Time    08/11/13  6:10 AM      Result Value Range   Hemoglobin A1C 6.4 (*) <5.7 %   Comment: (NOTE)                                                                               According to the ADA Clinical Practice Recommendations for 2011, when     HbA1c is used as a screening test:      >=6.5%   Diagnostic of Diabetes Mellitus               (if abnormal result is confirmed)     5.7-6.4%   Increased risk of developing Diabetes Mellitus     References:Diagnosis and Classification of Diabetes Mellitus,Diabetes     Care,2011,34(Suppl 1):S62-S69 and Standards of Medical Care in             Diabetes - 2011,Diabetes Care,2011,34 (Suppl 1):S11-S61.   Mean Plasma Glucose 137 (*) <117 mg/dL   Comment: Performed at [facility]  GLUCOSE, CAPILLARY     Status: Abnormal   Collection Time    08/11/13  8:04 AM      Result Value Range   Glucose-Capillary 104 (*) 70 - 99 mg/dL   Comment 1 Notify RN    GLUCOSE, CAPILLARY     Status: Abnormal   Collection Time    08/11/13 11:21 AM      Result Value Range   Glucose-Capillary 120 (*) 70 - 99 mg/dL   Comment 1 Notify RN    GLUCOSE, CAPILLARY     Status: Abnormal   Collection Time    08/11/13  4:18 PM      Result Value Range   Glucose-Capillary 122 (*) 70 - 99 mg/dL   Comment 1 Notify RN     Comment 2 Documented in Chart     Ct Abdomen Pelvis W Contrast  08/10/2013   CLINICAL DATA:  Mid abdominal pain for several weeks. Periumbilical pain.  EXAM: CT ABDOMEN AND PELVIS WITH CONTRAST  TECHNIQUE: Multidetector CT imaging of the abdomen and pelvis was performed using the standard protocol following bolus administration of intravenous contrast.  CONTRAST:  OMNIPAQUE IOHEXOL 300 MG/ML  SOLN  COMPARISON:  12/23/2008  FINDINGS: Lung bases are clear allowing for motion artifact. Streak artifact from the arms limits visualization of some areas. Low-attenuation lesions in the liver are with  anterior segment right lobe of liver lesion measuring 9 mm and inferior right lobe of liver lesion 2.8 cm. These are likely cysts and were present previously without change.  The gallbladder is mildly distended and filled with sludge. The gallbladder wall is diffusely thickened and edematous with pericholecystic stranding. Changes are consistent with  acute cholecystitis. No bile duct dilatation. The spleen, pancreas, adrenal glands, kidneys, abdominal aorta, inferior vena cava, and retroperitoneal lymph nodes are unremarkable. The stomach, small bowel, and colon are not abnormally distended. Contrast material flows to the distal colon without evidence of obstruction. No free air or free fluid in the abdomen.  Pelvis: Bladder wall is not thickened. Uterus and ovaries are not enlarged. Appendix is normal. No evidence of diverticulitis. No free or loculated pelvic fluid collections. Small umbilical hernia containing fat. Degenerative changes in the lumbar spine. Spondylolysis with mild spondylolisthesis at L4-5. No destructive bone lesions appreciated.  IMPRESSION: Thick edematous gallbladder wall with gallbladder distention and sludge and pericholecystic inflammatory stranding. Changes are consistent with acute cholecystitis.   Electronically Signed   By: Burman Nieves M.D.   On: 08/10/2013 03:15   Mr 3d Recon At Scanner  08/11/2013   CLINICAL DATA:  Right upper quadrant pain radiating to back. Nausea and vomiting. Cholecystitis. Elevated liver function tests. Clinical suspicion for choledocholithiasis.  EXAM: MRI ABDOMEN WITHOUT AND WITH CONTRAST (INCLUDING MRCP)  TECHNIQUE: Multiplanar multisequence MR imaging of the abdomen was performed both before and after the administration of intravenous contrast. Heavily T2-weighted images of the biliary and pancreatic ducts were obtained, and three-dimensional MRCP images were rendered by post processing.  CONTRAST:  20mL MULTIHANCE GADOBENATE DIMEGLUMINE 529 MG/ML IV  SOLN  COMPARISON:  CT on 08/10/2013  FINDINGS: Numerous tiny gallstones are seen. Gallbladder is distended and demonstrates diffuse wall thickening and mild pericholecystic inflammatory changes, consistent with acute cholecystitis.  Mild biliary ductal dilatation is seen with common bile duct measuring 11 mm. At least 2 tiny less than 1 cm calculi are seen within the distal common bile duct on thin slab coronal and axial MRCP sequences.  No evidence of pancreatic mass or pancreatic ductal dilatation. No evidence of pancreas divisum. Several small hepatic cysts are noted, but no liver mass or abscess identified. The spleen, adrenal glands, and kidneys are normal in appearance. No soft tissue masses or lymphadenopathy identified.  IMPRESSION: Acute cholecystitis.  Mild biliary ductal dilatation, with at least 2 tiny less than 1 cm calculi in distal common bile duct.  Several benign hepatic cysts incidentally noted. No other significant abnormality identified.   Electronically Signed   By: Myles Rosenthal M.D.   On: 08/11/2013 09:23   Mr Abd W/wo Cm/mrcp  08/11/2013   CLINICAL DATA:  Right upper quadrant pain radiating to back. Nausea and vomiting. Cholecystitis. Elevated liver function tests. Clinical suspicion for choledocholithiasis.  EXAM: MRI ABDOMEN WITHOUT AND WITH CONTRAST (INCLUDING MRCP)  TECHNIQUE: Multiplanar multisequence MR imaging of the abdomen was performed both before and after the administration of intravenous contrast. Heavily T2-weighted images of the biliary and pancreatic ducts were obtained, and three-dimensional MRCP images were rendered by post processing.  CONTRAST:  20mL MULTIHANCE GADOBENATE DIMEGLUMINE 529 MG/ML IV SOLN  COMPARISON:  CT on 08/10/2013  FINDINGS: Numerous tiny gallstones are seen. Gallbladder is distended and demonstrates diffuse wall thickening and mild pericholecystic inflammatory changes, consistent with acute cholecystitis.  Mild biliary ductal dilatation is seen with  common bile duct measuring 11 mm. At least 2 tiny less than 1 cm calculi are seen within the distal common bile duct on thin slab coronal and axial MRCP sequences.  No evidence of pancreatic mass or pancreatic ductal dilatation. No evidence of pancreas divisum. Several small hepatic cysts are noted, but no liver mass or abscess identified. The spleen, adrenal glands, and kidneys are normal in  appearance. No soft tissue masses or lymphadenopathy identified.  IMPRESSION: Acute cholecystitis.  Mild biliary ductal dilatation, with at least 2 tiny less than 1 cm calculi in distal common bile duct.  Several benign hepatic cysts incidentally noted. No other significant abnormality identified.   Electronically Signed   By: Myles Rosenthal M.D.   On: 08/11/2013 09:23   Review of Systems  Constitutional: Negative.   HENT: Negative.   Eyes: Positive for blurred vision.  Respiratory: Negative.   Cardiovascular: Negative.   Gastrointestinal: Positive for nausea, vomiting and abdominal pain. Negative for heartburn, diarrhea, constipation, blood in stool and melena.  Genitourinary: Negative.   Musculoskeletal: Positive for joint pain.  Skin: Negative.   Neurological: Positive for focal weakness.  Endo/Heme/Allergies: Negative.   Psychiatric/Behavioral: Positive for depression. Negative for suicidal ideas, hallucinations and substance abuse. The patient is not nervous/anxious and does not have insomnia.    Blood pressure 121/73, pulse 87, temperature 98.2 F (36.8 C), temperature source Oral, resp. rate 18, height 5\' 6"  (1.676 m), weight 94.7 kg (208 lb 12.4 oz), SpO2 96.00%. Physical Exam  Constitutional: She is oriented to person, place, and time. She appears well-developed and well-nourished.  HENT:  Head: Normocephalic and atraumatic.  Eyes: EOM are normal.  Neck: Normal range of motion. Neck supple.  Cardiovascular: Normal rate and regular rhythm.   Respiratory: Effort normal and breath sounds normal.   GI: Soft. Bowel sounds are normal. She exhibits no distension and no mass. There is no tenderness. There is no rebound and no guarding.  Neurological: She is alert and oriented to person, place, and time.  Skin: Skin is warm and dry.  Psychiatric: She has a normal mood and affect. Her behavior is normal. Judgment and thought content normal.   Assessment/Plan: 1) Acute cholecytitis with cholecdocholithiasis on MRCP. Plans are to proceed with ab ERCP tomorrow. Patient is on Zosyn.  2) History of an embolic stroke-On Plavix; last dose was 4 day ago.  3) HTN.  4) AODM.  5) Depression on Paxil.  Gyanna Jarema 08/11/2013, 7:28 PM

## 2013-08-11 NOTE — Progress Notes (Signed)
Agree with A&P of MD,PA. Patient comfortable when I saw her. Agree with GI consult. WE can co-ordinate surgery when they have seen. Tomorrow should be enough time off Plavix

## 2013-08-11 NOTE — Progress Notes (Signed)
Chaplain to visit patient. Offered spiritual care. Prayed with patient and daughter. Discussed why in hospital. Offered spiritual care via on call for patient.  Chaplain Jones Skene

## 2013-08-11 NOTE — Progress Notes (Signed)
Subjective: Pt still with pain, no N/V.  Understands need for surgery to remove her gallbladder.  Also has stones on MRCP in CBD.    Objective: Vital signs in last 24 hours: Temp:  [97.6 F (36.4 C)-98.2 F (36.8 C)] 98.1 F (36.7 C) (12/01 1017) Pulse Rate:  [92-103] 92 (12/01 1017) Resp:  [16-18] 18 (12/01 1017) BP: (109-129)/(66-87) 109/87 mmHg (12/01 1017) SpO2:  [94 %-100 %] 96 % (12/01 1017) Weight:  [208 lb 12.4 oz (94.7 kg)] 208 lb 12.4 oz (94.7 kg) (11/30 2031) Last BM Date: 08/09/13  Intake/Output from previous day: 11/30 0701 - 12/01 0700 In: 2450 [I.V.:2300; IV Piggyback:150] Out: -  Intake/Output this shift:    PE: Gen:  Alert, NAD, pleasant Abd: Soft, ND, moderate tenderness in RUQ/epigastrium, +BS, no HSM, no abdominal scars noted   Lab Results:   Recent Labs  08/10/13 0702 08/11/13 0610  WBC 9.1 4.8  HGB 11.1* 10.8*  HCT 32.6* 32.5*  PLT 323 341   BMET  Recent Labs  08/09/13 2315 08/10/13 0702 08/11/13 0610  NA 135  --  144  K 3.6  --  3.2*  CL 97  --  106  CO2 24  --  26  GLUCOSE 102*  --  130*  BUN 18  --  8  CREATININE 0.88 0.81 0.78  CALCIUM 9.0  --  8.5   PT/INR No results found for this basename: LABPROT, INR,  in the last 72 hours CMP     Component Value Date/Time   NA 144 08/11/2013 0610   K 3.2* 08/11/2013 0610   CL 106 08/11/2013 0610   CO2 26 08/11/2013 0610   GLUCOSE 130* 08/11/2013 0610   BUN 8 08/11/2013 0610   CREATININE 0.78 08/11/2013 0610   CALCIUM 8.5 08/11/2013 0610   PROT 6.5 08/11/2013 0610   ALBUMIN 2.9* 08/11/2013 0610   AST 70* 08/11/2013 0610   ALT 110* 08/11/2013 0610   ALKPHOS 232* 08/11/2013 0610   BILITOT 3.1* 08/11/2013 0610   GFRNONAA 82* 08/11/2013 0610   GFRAA >90 08/11/2013 0610   Lipase     Component Value Date/Time   LIPASE 30 08/09/2013 2315       Studies/Results: Ct Abdomen Pelvis W Contrast  08/10/2013   CLINICAL DATA:  Mid abdominal pain for several weeks. Periumbilical pain.  EXAM:  CT ABDOMEN AND PELVIS WITH CONTRAST  TECHNIQUE: Multidetector CT imaging of the abdomen and pelvis was performed using the standard protocol following bolus administration of intravenous contrast.  CONTRAST:  OMNIPAQUE IOHEXOL 300 MG/ML  SOLN  COMPARISON:  12/23/2008  FINDINGS: Lung bases are clear allowing for motion artifact. Streak artifact from the arms limits visualization of some areas. Low-attenuation lesions in the liver are with anterior segment right lobe of liver lesion measuring 9 mm and inferior right lobe of liver lesion 2.8 cm. These are likely cysts and were present previously without change.  The gallbladder is mildly distended and filled with sludge. The gallbladder wall is diffusely thickened and edematous with pericholecystic stranding. Changes are consistent with acute cholecystitis. No bile duct dilatation. The spleen, pancreas, adrenal glands, kidneys, abdominal aorta, inferior vena cava, and retroperitoneal lymph nodes are unremarkable. The stomach, small bowel, and colon are not abnormally distended. Contrast material flows to the distal colon without evidence of obstruction. No free air or free fluid in the abdomen.  Pelvis: Bladder wall is not thickened. Uterus and ovaries are not enlarged. Appendix is normal. No evidence of diverticulitis.  No free or loculated pelvic fluid collections. Small umbilical hernia containing fat. Degenerative changes in the lumbar spine. Spondylolysis with mild spondylolisthesis at L4-5. No destructive bone lesions appreciated.  IMPRESSION: Thick edematous gallbladder wall with gallbladder distention and sludge and pericholecystic inflammatory stranding. Changes are consistent with acute cholecystitis.   Electronically Signed   By: Burman Nieves M.D.   On: 08/10/2013 03:15   Mr 3d Recon At Scanner  08/11/2013   CLINICAL DATA:  Right upper quadrant pain radiating to back. Nausea and vomiting. Cholecystitis. Elevated liver function tests. Clinical  suspicion for choledocholithiasis.  EXAM: MRI ABDOMEN WITHOUT AND WITH CONTRAST (INCLUDING MRCP)  TECHNIQUE: Multiplanar multisequence MR imaging of the abdomen was performed both before and after the administration of intravenous contrast. Heavily T2-weighted images of the biliary and pancreatic ducts were obtained, and three-dimensional MRCP images were rendered by post processing.  CONTRAST:  20mL MULTIHANCE GADOBENATE DIMEGLUMINE 529 MG/ML IV SOLN  COMPARISON:  CT on 08/10/2013  FINDINGS: Numerous tiny gallstones are seen. Gallbladder is distended and demonstrates diffuse wall thickening and mild pericholecystic inflammatory changes, consistent with acute cholecystitis.  Mild biliary ductal dilatation is seen with common bile duct measuring 11 mm. At least 2 tiny less than 1 cm calculi are seen within the distal common bile duct on thin slab coronal and axial MRCP sequences.  No evidence of pancreatic mass or pancreatic ductal dilatation. No evidence of pancreas divisum. Several small hepatic cysts are noted, but no liver mass or abscess identified. The spleen, adrenal glands, and kidneys are normal in appearance. No soft tissue masses or lymphadenopathy identified.  IMPRESSION: Acute cholecystitis.  Mild biliary ductal dilatation, with at least 2 tiny less than 1 cm calculi in distal common bile duct.  Several benign hepatic cysts incidentally noted. No other significant abnormality identified.   Electronically Signed   By: Myles Rosenthal M.D.   On: 08/11/2013 09:23   Mr Abd W/wo Cm/mrcp  08/11/2013   CLINICAL DATA:  Right upper quadrant pain radiating to back. Nausea and vomiting. Cholecystitis. Elevated liver function tests. Clinical suspicion for choledocholithiasis.  EXAM: MRI ABDOMEN WITHOUT AND WITH CONTRAST (INCLUDING MRCP)  TECHNIQUE: Multiplanar multisequence MR imaging of the abdomen was performed both before and after the administration of intravenous contrast. Heavily T2-weighted images of the  biliary and pancreatic ducts were obtained, and three-dimensional MRCP images were rendered by post processing.  CONTRAST:  20mL MULTIHANCE GADOBENATE DIMEGLUMINE 529 MG/ML IV SOLN  COMPARISON:  CT on 08/10/2013  FINDINGS: Numerous tiny gallstones are seen. Gallbladder is distended and demonstrates diffuse wall thickening and mild pericholecystic inflammatory changes, consistent with acute cholecystitis.  Mild biliary ductal dilatation is seen with common bile duct measuring 11 mm. At least 2 tiny less than 1 cm calculi are seen within the distal common bile duct on thin slab coronal and axial MRCP sequences.  No evidence of pancreatic mass or pancreatic ductal dilatation. No evidence of pancreas divisum. Several small hepatic cysts are noted, but no liver mass or abscess identified. The spleen, adrenal glands, and kidneys are normal in appearance. No soft tissue masses or lymphadenopathy identified.  IMPRESSION: Acute cholecystitis.  Mild biliary ductal dilatation, with at least 2 tiny less than 1 cm calculi in distal common bile duct.  Several benign hepatic cysts incidentally noted. No other significant abnormality identified.   Electronically Signed   By: Myles Rosenthal M.D.   On: 08/11/2013 09:23    Anti-infectives: Anti-infectives   Start     Dose/Rate  Route Frequency Ordered Stop   08/10/13 1400  cefOXitin (MEFOXIN) 1 g in dextrose 5 % 50 mL IVPB  Status:  Discontinued     1 g 100 mL/hr over 30 Minutes Intravenous 3 times per day 08/10/13 0652 08/10/13 1121   08/10/13 1400  piperacillin-tazobactam (ZOSYN) IVPB 3.375 g  Status:  Discontinued     3.375 g 100 mL/hr over 30 Minutes Intravenous 3 times per day 08/10/13 1121 08/10/13 1157   08/10/13 1200  piperacillin-tazobactam (ZOSYN) IVPB 3.375 g     3.375 g 12.5 mL/hr over 240 Minutes Intravenous 3 times per day 08/10/13 1158     08/10/13 0700  cefOXitin (MEFOXIN) 1 g in dextrose 5 % 50 mL IVPB     1 g 100 mL/hr over 30 Minutes Intravenous  Once  08/10/13 0656 08/10/13 0826       Assessment/Plan Acute cholecystitis  Choledocholithiasis Transaminitis - improving Hyperbilirubinemia -0.1 improved from yesterday 1.  No surgery today secondary to plavix, but may need ERCP for stone removal, agree with GI consult 2.  Continue to hold plavix 3.  NPO, IVF, pain control, antiemetics, antibiotics (zosyn) 4.  Possibly could coordinate with GI to do a combined procedure tomorrow    LOS: 2 days    Aris Georgia 08/11/2013, 10:58 AM Pager: [phone]

## 2013-08-11 NOTE — Progress Notes (Signed)
[facility] PROGRESS NOTE  Tracy [name] ZOX:[id number] DOB: 1940-10-12 DOA: 08/09/2013 PCP: Geraldo Pitter, MD  Brief history 72 year old female with a history of diabetes mellitus, hypertension with prior CVA, presents to the ED with 2 week history of intermittent epigastric and right upper quadrant pain. The patient is a poor historian, but she has been complaining ofintermittent nausea, vomiting, and worsening pain after meals for the past 2-3 weeks. The patient denies any fevers, chills, hematemesis, diarrhea, hematochezia, melena, dysuria, hematuria. Evaluation in the ED revealed AST 36, ALT 141, alkaline phosphatase 244, total bilirubin to 3.2. CT of the abdomen and pelvis showed gallbladder distention, gallbladder wall thickening with pericholecystic stranding concerning for acute cholecystitis. General surgery was consulted. They recommended MRCP. MRCP revealed 2 tiny stones in the common bile duct. The patient has been on Zosyn with improvement of her WBC.  Assessment/Plan: Acute cholecystitis  -Change antibiotics to IV Zosyn  -Discussed case with Dr. Derrill Center MRCP -Continue IV fluids  -Remain n.p.o. for now  -CT abdomen shows gallbladder wall thickening, edema, and pericholecystic stranding without ductal dilatation  -MRCP shows two tiny stones in CBD-->consult GI for possible ERCP Pyuria  -urine culture negative Leukocytosis  -Secondary to UTI and cholecystitis  -Improved with IV antibiotics  Diabetes mellitus, type II  -Hemoglobin A1c--pending -NovoLog sliding scale  History of stroke  -Hold Plavix in anticipation for surgery  Hypertension  -Discontinue antihypertensive medications as the patient's blood pressure is soft  Family Communication: Updated daughter Tracy [name] [phone] Disposition Plan: Home when medically stable  Antibiotics:  mefoxin 08/10/13  Zosyn 08/10/13>>>         Procedures/Studies: Ct Abdomen Pelvis W Contrast  08/10/2013    CLINICAL DATA:  Mid abdominal pain for several weeks. Periumbilical pain.  EXAM: CT ABDOMEN AND PELVIS WITH CONTRAST  TECHNIQUE: Multidetector CT imaging of the abdomen and pelvis was performed using the standard protocol following bolus administration of intravenous contrast.  CONTRAST:  OMNIPAQUE IOHEXOL 300 MG/ML  SOLN  COMPARISON:  12/23/2008  FINDINGS: Lung bases are clear allowing for motion artifact. Streak artifact from the arms limits visualization of some areas. Low-attenuation lesions in the liver are with anterior segment right lobe of liver lesion measuring 9 mm and inferior right lobe of liver lesion 2.8 cm. These are likely cysts and were present previously without change.  The gallbladder is mildly distended and filled with sludge. The gallbladder wall is diffusely thickened and edematous with pericholecystic stranding. Changes are consistent with acute cholecystitis. No bile duct dilatation. The spleen, pancreas, adrenal glands, kidneys, abdominal aorta, inferior vena cava, and retroperitoneal lymph nodes are unremarkable. The stomach, small bowel, and colon are not abnormally distended. Contrast material flows to the distal colon without evidence of obstruction. No free air or free fluid in the abdomen.  Pelvis: Bladder wall is not thickened. Uterus and ovaries are not enlarged. Appendix is normal. No evidence of diverticulitis. No free or loculated pelvic fluid collections. Small umbilical hernia containing fat. Degenerative changes in the lumbar spine. Spondylolysis with mild spondylolisthesis at L4-5. No destructive bone lesions appreciated.  IMPRESSION: Thick edematous gallbladder wall with gallbladder distention and sludge and pericholecystic inflammatory stranding. Changes are consistent with acute cholecystitis.   Electronically Signed   By: Burman Nieves M.D.   On: 08/10/2013 03:15   Mr 3d Recon At Scanner  08/11/2013   CLINICAL DATA:  Right upper quadrant pain radiating to  back. Nausea and vomiting. Cholecystitis. Elevated liver function tests. Clinical suspicion for choledocholithiasis.  EXAM: MRI ABDOMEN WITHOUT AND WITH CONTRAST (INCLUDING MRCP)  TECHNIQUE: Multiplanar multisequence MR imaging of the abdomen was performed both before and after the administration of intravenous contrast. Heavily T2-weighted images of the biliary and pancreatic ducts were obtained, and three-dimensional MRCP images were rendered by post processing.  CONTRAST:  20mL MULTIHANCE GADOBENATE DIMEGLUMINE 529 MG/ML IV SOLN  COMPARISON:  CT on 08/10/2013  FINDINGS: Numerous tiny gallstones are seen. Gallbladder is distended and demonstrates diffuse wall thickening and mild pericholecystic inflammatory changes, consistent with acute cholecystitis.  Mild biliary ductal dilatation is seen with common bile duct measuring 11 mm. At least 2 tiny less than 1 cm calculi are seen within the distal common bile duct on thin slab coronal and axial MRCP sequences.  No evidence of pancreatic mass or pancreatic ductal dilatation. No evidence of pancreas divisum. Several small hepatic cysts are noted, but no liver mass or abscess identified. The spleen, adrenal glands, and kidneys are normal in appearance. No soft tissue masses or lymphadenopathy identified.  IMPRESSION: Acute cholecystitis.  Mild biliary ductal dilatation, with at least 2 tiny less than 1 cm calculi in distal common bile duct.  Several benign hepatic cysts incidentally noted. No other significant abnormality identified.   Electronically Signed   By: Myles Rosenthal M.D.   On: 08/11/2013 09:23   Mr Abd W/wo Cm/mrcp  08/11/2013   CLINICAL DATA:  Right upper quadrant pain radiating to back. Nausea and vomiting. Cholecystitis. Elevated liver function tests. Clinical suspicion for choledocholithiasis.  EXAM: MRI ABDOMEN WITHOUT AND WITH CONTRAST (INCLUDING MRCP)  TECHNIQUE: Multiplanar multisequence MR imaging of the abdomen was performed both before and after  the administration of intravenous contrast. Heavily T2-weighted images of the biliary and pancreatic ducts were obtained, and three-dimensional MRCP images were rendered by post processing.  CONTRAST:  20mL MULTIHANCE GADOBENATE DIMEGLUMINE 529 MG/ML IV SOLN  COMPARISON:  CT on 08/10/2013  FINDINGS: Numerous tiny gallstones are seen. Gallbladder is distended and demonstrates diffuse wall thickening and mild pericholecystic inflammatory changes, consistent with acute cholecystitis.  Mild biliary ductal dilatation is seen with common bile duct measuring 11 mm. At least 2 tiny less than 1 cm calculi are seen within the distal common bile duct on thin slab coronal and axial MRCP sequences.  No evidence of pancreatic mass or pancreatic ductal dilatation. No evidence of pancreas divisum. Several small hepatic cysts are noted, but no liver mass or abscess identified. The spleen, adrenal glands, and kidneys are normal in appearance. No soft tissue masses or lymphadenopathy identified.  IMPRESSION: Acute cholecystitis.  Mild biliary ductal dilatation, with at least 2 tiny less than 1 cm calculi in distal common bile duct.  Several benign hepatic cysts incidentally noted. No other significant abnormality identified.   Electronically Signed   By: Myles Rosenthal M.D.   On: 08/11/2013 09:23         Subjective: Patient continues to complain of right upper quadrant pain. Denies any nausea, vomiting, fever, chills, chest discomfort, shortness of breath, dizziness, dysuria, hematuria.  Objective: Filed Vitals:   08/10/13 0838 08/10/13 1400 08/10/13 2031 08/11/13 0443  BP: 105/68 111/66 129/83 124/67  Pulse: 103 95 92 103  Temp: 98.2 F (36.8 C) 98 F (36.7 C) 97.6 F (36.4 C) 98.2 F (36.8 C)  TempSrc: Oral Oral Oral Oral  Resp: 18 18 18 16   Height:   5\' 6"  (1.676 m)   Weight:   94.7 kg (208 lb 12.4 oz)   SpO2: 93% 94% 99% 100%  Intake/Output Summary (Last 24 hours) at 08/11/13 0952 Last data filed at  08/11/13 0600  Gross per 24 hour  Intake   2450 ml  Output      0 ml  Net   2450 ml   Weight change: -0.555 kg (-1 lb 3.6 oz) Exam:   General:  Pt is alert, follows commands appropriately, not in acute distress   [place]/AT  Cardiovascular: RRR, S1/S2, no rubs, no gallops  Respiratory: CTA bilaterally, no wheezing, no crackles, no rhonchi  Abdomen: Soft/+BS, RUQ tender without peritoneal sign, non distended, no guarding  Extremities: trace LE edema, No lymphangitis, No petechiae, No rashes, no synovitis  Data Reviewed: Basic Metabolic Panel:  Recent Labs Lab 08/07/13 2219 08/09/13 2315 08/10/13 0702 08/11/13 0610  NA 136 135  --  144  K 3.5 3.6  --  3.2*  CL 98 97  --  106  CO2 27 24  --  26  GLUCOSE 143* 102*  --  130*  BUN 18 18  --  8  CREATININE 0.88 0.88 0.81 0.78  CALCIUM 9.3 9.0  --  8.5   Liver Function Tests:  Recent Labs Lab 08/09/13 2315 08/11/13 0610  AST 86* 70*  ALT 141* 110*  ALKPHOS 244* 232*  BILITOT 3.2* 3.1*  PROT 7.3 6.5  ALBUMIN 3.6 2.9*    Recent Labs Lab 08/09/13 2315  LIPASE 30   No results found for this basename: AMMONIA,  in the last 168 hours CBC:  Recent Labs Lab 08/07/13 2219 08/09/13 2315 08/10/13 0702 08/11/13 0610  WBC 11.7* 11.5* 9.1 4.8  NEUTROABS 9.4* 9.5*  --   --   HGB 12.5 11.7* 11.1* 10.8*  HCT 37.1 34.3* 32.6* 32.5*  MCV 86.1 85.8 84.9 86.4  PLT 359 287 323 341   Cardiac Enzymes: No results found for this basename: CKTOTAL, CKMB, CKMBINDEX, TROPONINI,  in the last 168 hours BNP: No components found with this basename: POCBNP,  CBG:  Recent Labs Lab 08/10/13 1702 08/10/13 2028 08/11/13 0006 08/11/13 0438 08/11/13 0804  GLUCAP 91 111* 109* 132* 104*    Recent Results (from the past 240 hour(s))  URINE CULTURE     Status: None   Collection Time    08/10/13  3:43 AM      Result Value Range Status   Specimen Description URINE, RANDOM   Final   Special Requests NONE   Final   Culture  Setup  Time     Final   Value: 08/10/2013 13:20     Performed at [facility] Count     Final   Value: NO GROWTH     Performed at [facility]   Culture     Final   Value: NO GROWTH     Performed at [facility]   Report Status 08/11/2013 FINAL   Final     Scheduled Meds: . buPROPion  150 mg Oral Daily  . heparin  5,000 Units Subcutaneous Q8H  . [START ON 08/12/2013] influenza vac split quadrivalent PF  0.5 mL Intramuscular Tomorrow-1000  . insulin aspart  0-15 Units Subcutaneous Q4H  . PARoxetine  40 mg Oral Daily  . piperacillin-tazobactam (ZOSYN)  IV  3.375 g Intravenous Q8H  . [START ON 08/12/2013] pneumococcal 23 valent vaccine  0.5 mL Intramuscular Tomorrow-1000   Continuous Infusions: . dextrose 5 % and 0.9% NaCl Stopped (08/11/13 0611)     Shawnn Bouillon, DO  [facility] Pager [phone]  If 7PM-7AM, please contact night-coverage [email or web address] Password [email or web address] 08/11/2013, 9:52 AM   LOS: 2 days

## 2013-08-12 ENCOUNTER — Encounter (HOSPITAL_COMMUNITY): Payer: Self-pay | Admitting: *Deleted

## 2013-08-12 ENCOUNTER — Inpatient Hospital Stay (HOSPITAL_COMMUNITY): Payer: Medicare Other

## 2013-08-12 ENCOUNTER — Encounter (HOSPITAL_COMMUNITY)
Admission: EM | Disposition: A | Discharge status: Home / Self Care | Payer: Self-pay | Source: Home / Self Care | Attending: Internal Medicine

## 2013-08-12 HISTORY — PX: ERCP: SHX5425

## 2013-08-12 HISTORY — PX: SPHINCTEROTOMY: SHX5544

## 2013-08-12 LAB — GLUCOSE, CAPILLARY
Glucose-Capillary: 99 mg/dL (ref 70–99)
Glucose-Capillary: 110 mg/dL — ABNORMAL HIGH (ref 70–99)
Glucose-Capillary: 159 mg/dL — ABNORMAL HIGH (ref 70–99)

## 2013-08-12 LAB — CBC
HCT: 33.7 % — ABNORMAL LOW (ref 36.0–46.0)
MCH: 29 pg (ref 26.0–34.0)
MCHC: 33.5 g/dL (ref 30.0–36.0)
MCV: 86.4 fL (ref 78.0–100.0)
Platelets: 412 10*3/uL — ABNORMAL HIGH (ref 150–400)
RBC: 3.9 MIL/uL (ref 3.87–5.11)
RDW: 16.8 % — ABNORMAL HIGH (ref 11.5–15.5)

## 2013-08-12 LAB — COMPREHENSIVE METABOLIC PANEL
ALT: 103 U/L — ABNORMAL HIGH (ref 0–35)
Alkaline Phosphatase: 248 U/L — ABNORMAL HIGH (ref 39–117)
BUN: 5 mg/dL — ABNORMAL LOW (ref 6–23)
Creatinine, Ser: 0.79 mg/dL (ref 0.50–1.10)
GFR calc Af Amer: >90 mL/min (ref >90)
GFR calc non Af Amer: 81 mL/min — ABNORMAL LOW (ref >90)
Glucose, Bld: 110 mg/dL — ABNORMAL HIGH (ref 70–99)
Sodium: 144 mEq/L (ref 135–145)
Total Bilirubin: 1.4 mg/dL — ABNORMAL HIGH (ref 0.3–1.2)

## 2013-08-12 LAB — SURGICAL PCR SCREEN: Staphylococcus aureus: NEGATIVE

## 2013-08-12 SURGERY — ERCP, WITH INTERVENTION IF INDICATED
Anesthesia: Moderate Sedation

## 2013-08-12 MED ORDER — MIDAZOLAM HCL 10 MG/2ML IJ SOLN
INTRAMUSCULAR | Status: DC | PRN
  Administered 2013-08-12 (×2): 2 mg via INTRAVENOUS
  Administered 2013-08-12 (×6): 1 mg via INTRAVENOUS

## 2013-08-12 MED ORDER — SODIUM CHLORIDE 0.9 % IV SOLN
INTRAVENOUS | Status: DC | PRN
  Administered 2013-08-12: 16:00:00

## 2013-08-12 MED ORDER — FENTANYL CITRATE 0.05 MG/ML IJ SOLN
INTRAMUSCULAR | Status: AC
  Filled 2013-08-12: qty 4

## 2013-08-12 MED ORDER — GLUCAGON HCL (RDNA) 1 MG IJ SOLR
INTRAMUSCULAR | Status: AC
  Filled 2013-08-12: qty 2

## 2013-08-12 MED ORDER — BUTAMBEN-TETRACAINE-BENZOCAINE 2-2-14 % EX AERO
INHALATION_SPRAY | CUTANEOUS | Status: DC | PRN
  Administered 2013-08-12: 1 via TOPICAL

## 2013-08-12 MED ORDER — POTASSIUM CHLORIDE 10 MEQ/100ML IV SOLN
10.0000 meq | INTRAVENOUS | Status: AC
  Administered 2013-08-12 (×2): 10 meq via INTRAVENOUS
  Filled 2013-08-12 (×4): qty 100

## 2013-08-12 MED ORDER — DIPHENHYDRAMINE HCL 50 MG/ML IJ SOLN
INTRAMUSCULAR | Status: DC | PRN
  Administered 2013-08-12: 12.5 mg via INTRAVENOUS

## 2013-08-12 MED ORDER — GLUCAGON HCL (RDNA) 1 MG IJ SOLR
INTRAMUSCULAR | Status: DC | PRN
  Administered 2013-08-12 (×2): .5 mg via INTRAVENOUS

## 2013-08-12 MED ORDER — MIDAZOLAM HCL 5 MG/ML IJ SOLN
INTRAMUSCULAR | Status: AC
  Filled 2013-08-12: qty 2

## 2013-08-12 MED ORDER — FENTANYL CITRATE 0.05 MG/ML IJ SOLN
INTRAMUSCULAR | Status: DC | PRN
  Administered 2013-08-12 (×4): 25 ug via INTRAVENOUS

## 2013-08-12 MED ORDER — DIPHENHYDRAMINE HCL 50 MG/ML IJ SOLN
INTRAMUSCULAR | Status: AC
  Filled 2013-08-12: qty 1

## 2013-08-12 NOTE — Progress Notes (Signed)
Acute cholecystitis  Assessment: Gallstones, CBD stones,   Plan: Appears to be scheduled for ERCP today. If that goes well can do lap chole tomorrow. I discussed lap chole with her and she is agreeable   Subjective: Feels a little better today  Objective: Vital signs in last 24 hours: Temp:  [98 F (36.7 C)-98.4 F (36.9 C)] 98.2 F (36.8 C) (12/02 0406) Pulse Rate:  [85-98] 98 (12/02 0406) Resp:  [17-18] 17 (12/02 0406) BP: (109-125)/(73-87) 125/73 mmHg (12/02 0406) SpO2:  [93 %-99 %] 99 % (12/02 0406) Weight:  [214 lb 1.1 oz (97.1 kg)] 214 lb 1.1 oz (97.1 kg) (12/01 2011) Last BM Date: 08/09/13  Intake/Output from previous day: 12/01 0701 - 12/02 0700 In: 2450 [I.V.:2400; IV Piggyback:50] Out: -   General appearance: alert, cooperative and no distress GI: soft, non-tender; bowel sounds normal; no masses,  no organomegaly  Lab Results:  Results for orders placed during the hospital encounter of 08/09/13 (from the past 24 hour(s))  GLUCOSE, CAPILLARY     Status: Abnormal   Collection Time    08/11/13 11:21 AM      Result Value Range   Glucose-Capillary 120 (*) 70 - 99 mg/dL   Comment 1 Notify RN    GLUCOSE, CAPILLARY     Status: Abnormal   Collection Time    08/11/13  4:18 PM      Result Value Range   Glucose-Capillary 122 (*) 70 - 99 mg/dL   Comment 1 Notify RN     Comment 2 Documented in Chart    GLUCOSE, CAPILLARY     Status: Abnormal   Collection Time    08/11/13  8:07 PM      Result Value Range   Glucose-Capillary 105 (*) 70 - 99 mg/dL  GLUCOSE, CAPILLARY     Status: Abnormal   Collection Time    08/11/13 11:46 PM      Result Value Range   Glucose-Capillary 103 (*) 70 - 99 mg/dL  GLUCOSE, CAPILLARY     Status: None   Collection Time    08/12/13  4:03 AM      Result Value Range   Glucose-Capillary 99  70 - 99 mg/dL  CBC     Status: Abnormal   Collection Time    08/12/13  5:20 AM      Result Value Range   WBC 4.9  4.0 - 10.5 K/uL   RBC 3.90  3.87  - 5.11 MIL/uL   Hemoglobin 11.3 (*) 12.0 - 15.0 g/dL   HCT 33.7 (*) 36.0 - 46.0 %   MCV 86.4  78.0 - 100.0 fL   MCH 29.0  26.0 - 34.0 pg   MCHC 33.5  30.0 - 36.0 g/dL   RDW 16.8 (*) 11.5 - 15.5 %   Platelets 412 (*) 150 - 400 K/uL  COMPREHENSIVE METABOLIC PANEL     Status: Abnormal   Collection Time    08/12/13  5:20 AM      Result Value Range   Sodium 144  135 - 145 mEq/L   Potassium 3.2 (*) 3.5 - 5.1 mEq/L   Chloride 106  96 - 112 mEq/L   CO2 27  19 - 32 mEq/L   Glucose, Bld 110 (*) 70 - 99 mg/dL   BUN 5 (*) 6 - 23 mg/dL   Creatinine, Ser 0.79  0.50 - 1.10 mg/dL   Calcium 8.8  8.4 - 10.5 mg/dL   Total Protein 6.8  6.0 - 8.3   g/dL   Albumin 3.2 (*) 3.5 - 5.2 g/dL   AST 54 (*) 0 - 37 U/L   ALT 103 (*) 0 - 35 U/L   Alkaline Phosphatase 248 (*) 39 - 117 U/L   Total Bilirubin 1.4 (*) 0.3 - 1.2 mg/dL   GFR calc non Af Amer 81 (*) >90 mL/min   GFR calc Af Amer >90  >90 mL/min  GLUCOSE, CAPILLARY     Status: Abnormal   Collection Time    08/12/13  7:26 AM      Result Value Range   Glucose-Capillary 119 (*) 70 - 99 mg/dL     Studies/Results Radiology     MEDS, Scheduled . buPROPion  150 mg Oral Daily  . heparin  5,000 Units Subcutaneous Q8H  . influenza vac split quadrivalent PF  0.5 mL Intramuscular Tomorrow-1000  . insulin aspart  0-15 Units Subcutaneous Q4H  . PARoxetine  40 mg Oral Daily  . piperacillin-tazobactam (ZOSYN)  IV  3.375 g Intravenous Q8H  . pneumococcal 23 valent vaccine  0.5 mL Intramuscular Tomorrow-1000       LOS: 3 days    Danasha Melman J. Jamella Grayer, MD, FACS [facility] [phone]   08/12/2013 8:45 AM         

## 2013-08-12 NOTE — Progress Notes (Signed)
Discussed ERCP with Dr Elnoria Howard and stones retrieved. If she does well overnight will plan lap chole in am

## 2013-08-12 NOTE — Progress Notes (Signed)
[facility] PROGRESS NOTE  Tracy [name] ZOX:[id number] DOB: 1941-08-09 DOA: 08/09/2013 PCP: Geraldo Pitter, MD  Brief history  72 year old female with a history of diabetes mellitus, hypertension with prior CVA, presents to the ED with 2 week history of intermittent epigastric and right upper quadrant pain. The patient is a poor historian, but she has been complaining ofintermittent nausea, vomiting, and worsening pain after meals for the past 2-3 weeks. The patient denies any fevers, chills, hematemesis, diarrhea, hematochezia, melena, dysuria, hematuria. Evaluation in the ED revealed AST 36, ALT 141, alkaline phosphatase 244, total bilirubin to 3.2. CT of the abdomen and pelvis showed gallbladder distention, gallbladder wall thickening with pericholecystic stranding concerning for acute cholecystitis. General surgery was consulted. They recommended MRCP. MRCP revealed 2 tiny stones in the common bile duct. The patient has been on Zosyn with improvement of her WBC.  ERCP was performed by Dr. Elnoria Howard on 08/12/13 whom removed 2 stones from CBD.  Assessment/Plan: Acute cholecystitis  -Changed antibiotics to IV Zosyn  -Discussed case with Dr. Derrill Center MRCP  -Continue IV fluids  -Remain n.p.o. for now  -CT abdomen shows gallbladder wall thickening, edema, and pericholecystic stranding without ductal dilatation  -MRCP shows two tiny stones in CBD-->consult GI for possible ERCP  Choledocholithiasis -12//2/14--ERCP (Dr. Claire Shown 2 stones -plans for lap chole on 08/13/13 if no post-ERCP pancreatitis Pyuria  -urine culture negative  Leukocytosis  -Secondary to UTI and cholecystitis  -Improved with IV antibiotics  Diabetes mellitus, type II  -Hemoglobin A1c--6.4 -NovoLog sliding scale  History of stroke  -Hold Plavix in anticipation for surgery  Hypertension  -Discontinue antihypertensive medications as the patient's blood pressure is soft--may need to restart antihypertensive  medications if BP increases -pt only on spironolactone Hypokalemia -Replete -Check magnesium Family Communication: Updated daughter [name] at bedside Disposition Plan: Home when medically stable  Antibiotics:  mefoxin 08/10/13  Zosyn 08/10/13>>>         Procedures/Studies: Ct Abdomen Pelvis W Contrast  08/10/2013   CLINICAL DATA:  Mid abdominal pain for several weeks. Periumbilical pain.  EXAM: CT ABDOMEN AND PELVIS WITH CONTRAST  TECHNIQUE: Multidetector CT imaging of the abdomen and pelvis was performed using the standard protocol following bolus administration of intravenous contrast.  CONTRAST:  OMNIPAQUE IOHEXOL 300 MG/ML  SOLN  COMPARISON:  12/23/2008  FINDINGS: Lung bases are clear allowing for motion artifact. Streak artifact from the arms limits visualization of some areas. Low-attenuation lesions in the liver are with anterior segment right lobe of liver lesion measuring 9 mm and inferior right lobe of liver lesion 2.8 cm. These are likely cysts and were present previously without change.  The gallbladder is mildly distended and filled with sludge. The gallbladder wall is diffusely thickened and edematous with pericholecystic stranding. Changes are consistent with acute cholecystitis. No bile duct dilatation. The spleen, pancreas, adrenal glands, kidneys, abdominal aorta, inferior vena cava, and retroperitoneal lymph nodes are unremarkable. The stomach, small bowel, and colon are not abnormally distended. Contrast material flows to the distal colon without evidence of obstruction. No free air or free fluid in the abdomen.  Pelvis: Bladder wall is not thickened. Uterus and ovaries are not enlarged. Appendix is normal. No evidence of diverticulitis. No free or loculated pelvic fluid collections. Small umbilical hernia containing fat. Degenerative changes in the lumbar spine. Spondylolysis with mild spondylolisthesis at L4-5. No destructive bone lesions appreciated.  IMPRESSION:  Thick edematous gallbladder wall with gallbladder distention and sludge and pericholecystic inflammatory stranding. Changes are consistent with acute  cholecystitis.   Electronically Signed   By: Burman Nieves M.D.   On: 08/10/2013 03:15   Mr 3d Recon At Scanner  08/11/2013   CLINICAL DATA:  Right upper quadrant pain radiating to back. Nausea and vomiting. Cholecystitis. Elevated liver function tests. Clinical suspicion for choledocholithiasis.  EXAM: MRI ABDOMEN WITHOUT AND WITH CONTRAST (INCLUDING MRCP)  TECHNIQUE: Multiplanar multisequence MR imaging of the abdomen was performed both before and after the administration of intravenous contrast. Heavily T2-weighted images of the biliary and pancreatic ducts were obtained, and three-dimensional MRCP images were rendered by post processing.  CONTRAST:  20mL MULTIHANCE GADOBENATE DIMEGLUMINE 529 MG/ML IV SOLN  COMPARISON:  CT on 08/10/2013  FINDINGS: Numerous tiny gallstones are seen. Gallbladder is distended and demonstrates diffuse wall thickening and mild pericholecystic inflammatory changes, consistent with acute cholecystitis.  Mild biliary ductal dilatation is seen with common bile duct measuring 11 mm. At least 2 tiny less than 1 cm calculi are seen within the distal common bile duct on thin slab coronal and axial MRCP sequences.  No evidence of pancreatic mass or pancreatic ductal dilatation. No evidence of pancreas divisum. Several small hepatic cysts are noted, but no liver mass or abscess identified. The spleen, adrenal glands, and kidneys are normal in appearance. No soft tissue masses or lymphadenopathy identified.  IMPRESSION: Acute cholecystitis.  Mild biliary ductal dilatation, with at least 2 tiny less than 1 cm calculi in distal common bile duct.  Several benign hepatic cysts incidentally noted. No other significant abnormality identified.   Electronically Signed   By: Myles Rosenthal M.D.   On: 08/11/2013 09:23   Mr Abd W/wo Cm/mrcp  08/11/2013    CLINICAL DATA:  Right upper quadrant pain radiating to back. Nausea and vomiting. Cholecystitis. Elevated liver function tests. Clinical suspicion for choledocholithiasis.  EXAM: MRI ABDOMEN WITHOUT AND WITH CONTRAST (INCLUDING MRCP)  TECHNIQUE: Multiplanar multisequence MR imaging of the abdomen was performed both before and after the administration of intravenous contrast. Heavily T2-weighted images of the biliary and pancreatic ducts were obtained, and three-dimensional MRCP images were rendered by post processing.  CONTRAST:  20mL MULTIHANCE GADOBENATE DIMEGLUMINE 529 MG/ML IV SOLN  COMPARISON:  CT on 08/10/2013  FINDINGS: Numerous tiny gallstones are seen. Gallbladder is distended and demonstrates diffuse wall thickening and mild pericholecystic inflammatory changes, consistent with acute cholecystitis.  Mild biliary ductal dilatation is seen with common bile duct measuring 11 mm. At least 2 tiny less than 1 cm calculi are seen within the distal common bile duct on thin slab coronal and axial MRCP sequences.  No evidence of pancreatic mass or pancreatic ductal dilatation. No evidence of pancreas divisum. Several small hepatic cysts are noted, but no liver mass or abscess identified. The spleen, adrenal glands, and kidneys are normal in appearance. No soft tissue masses or lymphadenopathy identified.  IMPRESSION: Acute cholecystitis.  Mild biliary ductal dilatation, with at least 2 tiny less than 1 cm calculi in distal common bile duct.  Several benign hepatic cysts incidentally noted. No other significant abnormality identified.   Electronically Signed   By: Myles Rosenthal M.D.   On: 08/11/2013 09:23         Subjective: Patient is groggy after ERCP. She arouses easily. Denies fevers, chills, shortness breath, vomiting, diarrhea.  Objective: Filed Vitals:   08/12/13 1620 08/12/13 1625 08/12/13 1630 08/12/13 1635  BP: 131/67 131/83 136/70 134/73  Pulse:      Temp:      TempSrc:  Resp: 14 17 17  14   Height:      Weight:      SpO2: 98% 99% 98% 99%    Intake/Output Summary (Last 24 hours) at 08/12/13 1736 Last data filed at 08/12/13 0800  Gross per 24 hour  Intake   1400 ml  Output      0 ml  Net   1400 ml   Weight change: 2.4 kg (5 lb 4.7 oz) Exam:   General:  Pt is alert, follows commands appropriately, not in acute distress  HEENT: No icterus,  [place]/AT  Cardiovascular: RRR, S1/S2, no rubs, no gallops  Respiratory: CTA bilaterally, no wheezing, no crackles, no rhonchi  Abdomen: Soft/+BS, non tender, non distended, no guarding  Extremities: No edema, No lymphangitis, No petechiae, No rashes, no synovitis  Data Reviewed: Basic Metabolic Panel:  Recent Labs Lab 08/07/13 2219 08/09/13 2315 08/10/13 0702 08/11/13 0610 08/12/13 0520  NA 136 135  --  144 144  K 3.5 3.6  --  3.2* 3.2*  CL 98 97  --  106 106  CO2 27 24  --  26 27  GLUCOSE 143* 102*  --  130* 110*  BUN 18 18  --  8 5*  CREATININE 0.88 0.88 0.81 0.78 0.79  CALCIUM 9.3 9.0  --  8.5 8.8   Liver Function Tests:  Recent Labs Lab 08/09/13 2315 08/11/13 0610 08/12/13 0520  AST 86* 70* 54*  ALT 141* 110* 103*  ALKPHOS 244* 232* 248*  BILITOT 3.2* 3.1* 1.4*  PROT 7.3 6.5 6.8  ALBUMIN 3.6 2.9* 3.2*    Recent Labs Lab 08/09/13 2315  LIPASE 30   No results found for this basename: AMMONIA,  in the last 168 hours CBC:  Recent Labs Lab 08/07/13 2219 08/09/13 2315 08/10/13 0702 08/11/13 0610 08/12/13 0520  WBC 11.7* 11.5* 9.1 4.8 4.9  NEUTROABS 9.4* 9.5*  --   --   --   HGB 12.5 11.7* 11.1* 10.8* 11.3*  HCT 37.1 34.3* 32.6* 32.5* 33.7*  MCV 86.1 85.8 84.9 86.4 86.4  PLT 359 287 323 341 412*   Cardiac Enzymes: No results found for this basename: CKTOTAL, CKMB, CKMBINDEX, TROPONINI,  in the last 168 hours BNP: No components found with this basename: POCBNP,  CBG:  Recent Labs Lab 08/11/13 2007 08/11/13 2346 08/12/13 0403 08/12/13 0726 08/12/13 1147  GLUCAP 105* 103* 99  119* 110*    Recent Results (from the past 240 hour(s))  URINE CULTURE     Status: None   Collection Time    08/10/13  3:43 AM      Result Value Range Status   Specimen Description URINE, RANDOM   Final   Special Requests NONE   Final   Culture  Setup Time     Final   Value: 08/10/2013 13:20     Performed at [facility] Count     Final   Value: NO GROWTH     Performed at [facility]   Culture     Final   Value: NO GROWTH     Performed at [facility]   Report Status 08/11/2013 FINAL   Final  SURGICAL PCR SCREEN     Status: None   Collection Time    08/12/13 10:55 AM      Result Value Range Status   MRSA, PCR NEGATIVE  NEGATIVE Final   Staphylococcus aureus NEGATIVE  NEGATIVE Final   Comment:  The Xpert SA Assay (FDA     approved for NASAL specimens     in patients over 59 years of age),     is one component of     a comprehensive surveillance     program.  Test performance has     been validated by [employer] for patients greater     than or equal to 77 year old.     It is not intended     to diagnose infection nor to     guide or monitor treatment.     Scheduled Meds: . buPROPion  150 mg Oral Daily  . heparin  5,000 Units Subcutaneous Q8H  . insulin aspart  0-15 Units Subcutaneous Q4H  . PARoxetine  40 mg Oral Daily  . piperacillin-tazobactam (ZOSYN)  IV  3.375 g Intravenous Q8H   Continuous Infusions: . dextrose 5 % and 0.9% NaCl 100 mL/hr at 08/12/13 0656     Macayla Ekdahl, DO  [facility] Pager [phone]  If 7PM-7AM, please contact night-coverage [email or web address] Password [facility] 08/12/2013, 5:36 PM   LOS: 3 days

## 2013-08-12 NOTE — Interval H&P Note (Signed)
History and Physical Interval Note:  08/12/2013 1:32 PM  Tracy [name]  has presented today for surgery, with the diagnosis of Choledocholithiasis  The various methods of treatment have been discussed with the patient and family. After consideration of risks, benefits and other options for treatment, the patient has consented to  Procedure(s): ENDOSCOPIC RETROGRADE CHOLANGIOPANCREATOGRAPHY (ERCP) (N/A) as a surgical intervention .  The patient's history has been reviewed, patient examined, no change in status, stable for surgery.  I have reviewed the patient's chart and labs.  Questions were answered to the patient's satisfaction.     Junie Engram D

## 2013-08-12 NOTE — Op Note (Addendum)
[facility] [address] [place], [address]   ERCP PROCEDURE REPORT  PATIENT: Tracy [name], Tracy [name].  MR# :[id number] BIRTHDATE: 1941/05/10  GENDER: Female ENDOSCOPIST: Jeani Hawking, MD REFERRED BY: PROCEDURE DATE:  08/12/2013 PROCEDURE:   ERCP with removal of calculus/calculi ASA CLASS:   Class III INDICATIONS:Choledocholithiasis. MEDICATIONS: Versed 10 mg IV, Fentanyl 100 mcg IV, Benadryl 12.5 mg IV, and Glucagon 1 mg IV TOPICAL ANESTHETIC: Cetacaine Spray  DESCRIPTION OF PROCEDURE:   After the risks benefits and alternatives of the procedure were thoroughly explained, informed consent was obtained.  The     endoscope was introduced through the mouth  and advanced to the second portion of the duodenum.  The ampulla was located in this area.  The ampulla was long and floppy. The procedure was extremely difficult to perform.  The patient was combative and air insufflation was a constant issue as she eructated the air making visualization very difficult.  The PD was initially cannulated and it gave a false appearance of the CBD. Think that it was the CBD contrast was injected to see why the guidewire was having difficulty advancing.  The contrast highlighted the PD and then arrangements were to place a PD stent. However, during the process of stent placement the wire fell out of the PD and subsequent fluoroscopy revealed rapid drainage of the contrast.  With persistence the CBD was ultimately cannulated without any further PD cannulation.  The guidewire was secured in the right intrahepatic ducts.  Contrast injection revealed a mildly dilated CBD just under 1 cm.  No overt stones were identified.  The cystic duct partially opacified.  A large 1.5 cm sphincterotomy was created without incidence.  The CBD was swept once and this worsened her combativness.  A balloon occlusion cholangiogram was performed and there was no evidence of any retained stones.   The scope was then completely withdrawn from the patient and the procedure terminated.     COMPLICATIONS:  ENDOSCOPIC IMPRESSION: 1) Acute cholecystitis. 2) Choledocholithiasis s/p extraction.  See Addendum below.  RECOMMENDATIONS: 1) Monitor for post-ERCP pancreatitis. 2) Lap chole per Surgery.  ADDENDUM: Two small stones were recovered in the suction canister.    _______________________________ eSignedJeani Hawking, MD 08/12/2013 4:11 PM Revised: 08/12/2013 4:11 PM  CC:

## 2013-08-12 NOTE — H&P (View-Only) (Signed)
Acute cholecystitis  Assessment: Gallstones, CBD stones,   Plan: Appears to be scheduled for ERCP today. If that goes well can do lap chole tomorrow. I discussed lap chole with her and she is agreeable   Subjective: Feels a little better today  Objective: Vital signs in last 24 hours: Temp:  [98 F (36.7 C)-98.4 F (36.9 C)] 98.2 F (36.8 C) (12/02 0406) Pulse Rate:  [85-98] 98 (12/02 0406) Resp:  [17-18] 17 (12/02 0406) BP: (109-125)/(73-87) 125/73 mmHg (12/02 0406) SpO2:  [93 %-99 %] 99 % (12/02 0406) Weight:  [214 lb 1.1 oz (97.1 kg)] 214 lb 1.1 oz (97.1 kg) (12/01 2011) Last BM Date: 08/09/13  Intake/Output from previous day: 12/01 0701 - 12/02 0700 In: 2450 [I.V.:2400; IV Piggyback:50] Out: -   General appearance: alert, cooperative and no distress GI: soft, non-tender; bowel sounds normal; no masses,  no organomegaly  Lab Results:  Results for orders placed during the hospital encounter of 08/09/13 (from the past 24 hour(s))  GLUCOSE, CAPILLARY     Status: Abnormal   Collection Time    08/11/13 11:21 AM      Result Value Range   Glucose-Capillary 120 (*) 70 - 99 mg/dL   Comment 1 Notify RN    GLUCOSE, CAPILLARY     Status: Abnormal   Collection Time    08/11/13  4:18 PM      Result Value Range   Glucose-Capillary 122 (*) 70 - 99 mg/dL   Comment 1 Notify RN     Comment 2 Documented in Chart    GLUCOSE, CAPILLARY     Status: Abnormal   Collection Time    08/11/13  8:07 PM      Result Value Range   Glucose-Capillary 105 (*) 70 - 99 mg/dL  GLUCOSE, CAPILLARY     Status: Abnormal   Collection Time    08/11/13 11:46 PM      Result Value Range   Glucose-Capillary 103 (*) 70 - 99 mg/dL  GLUCOSE, CAPILLARY     Status: None   Collection Time    08/12/13  4:03 AM      Result Value Range   Glucose-Capillary 99  70 - 99 mg/dL  CBC     Status: Abnormal   Collection Time    08/12/13  5:20 AM      Result Value Range   WBC 4.9  4.0 - 10.5 K/uL   RBC 3.90  3.87  - 5.11 MIL/uL   Hemoglobin 11.3 (*) 12.0 - 15.0 g/dL   HCT 16.1 (*) 09.6 - 04.5 %   MCV 86.4  78.0 - 100.0 fL   MCH 29.0  26.0 - 34.0 pg   MCHC 33.5  30.0 - 36.0 g/dL   RDW 40.9 (*) 81.1 - 91.4 %   Platelets 412 (*) 150 - 400 K/uL  COMPREHENSIVE METABOLIC PANEL     Status: Abnormal   Collection Time    08/12/13  5:20 AM      Result Value Range   Sodium 144  135 - 145 mEq/L   Potassium 3.2 (*) 3.5 - 5.1 mEq/L   Chloride 106  96 - 112 mEq/L   CO2 27  19 - 32 mEq/L   Glucose, Bld 110 (*) 70 - 99 mg/dL   BUN 5 (*) 6 - 23 mg/dL   Creatinine, Ser 7.82  0.50 - 1.10 mg/dL   Calcium 8.8  8.4 - 95.6 mg/dL   Total Protein 6.8  6.0 - 8.3  g/dL   Albumin 3.2 (*) 3.5 - 5.2 g/dL   AST 54 (*) 0 - 37 U/L   ALT 103 (*) 0 - 35 U/L   Alkaline Phosphatase 248 (*) 39 - 117 U/L   Total Bilirubin 1.4 (*) 0.3 - 1.2 mg/dL   GFR calc non Af Amer 81 (*) >90 mL/min   GFR calc Af Amer >90  >90 mL/min  GLUCOSE, CAPILLARY     Status: Abnormal   Collection Time    08/12/13  7:26 AM      Result Value Range   Glucose-Capillary 119 (*) 70 - 99 mg/dL     Studies/Results Radiology     MEDS, Scheduled . buPROPion  150 mg Oral Daily  . heparin  5,000 Units Subcutaneous Q8H  . influenza vac split quadrivalent PF  0.5 mL Intramuscular Tomorrow-1000  . insulin aspart  0-15 Units Subcutaneous Q4H  . PARoxetine  40 mg Oral Daily  . piperacillin-tazobactam (ZOSYN)  IV  3.375 g Intravenous Q8H  . pneumococcal 23 valent vaccine  0.5 mL Intramuscular Tomorrow-1000       LOS: 3 days    Currie Paris, MD, [facility], [place] [phone]   08/12/2013 8:45 AM

## 2013-08-12 NOTE — Progress Notes (Signed)
After the procedure the Tech informed me that two stones were recovered in the suction canister.

## 2013-08-13 ENCOUNTER — Encounter (HOSPITAL_COMMUNITY)
Admission: EM | Disposition: A | Discharge status: Home / Self Care | Payer: Self-pay | Source: Home / Self Care | Attending: Internal Medicine

## 2013-08-13 ENCOUNTER — Encounter (HOSPITAL_COMMUNITY): Payer: Medicare Other | Admitting: Anesthesiology

## 2013-08-13 ENCOUNTER — Encounter (HOSPITAL_COMMUNITY): Payer: Self-pay | Admitting: Gastroenterology

## 2013-08-13 ENCOUNTER — Inpatient Hospital Stay (HOSPITAL_COMMUNITY): Payer: Medicare Other | Admitting: Anesthesiology

## 2013-08-13 DIAGNOSIS — K8000 Calculus of gallbladder with acute cholecystitis without obstruction: Secondary | ICD-10-CM

## 2013-08-13 HISTORY — PX: CHOLECYSTECTOMY: SHX55

## 2013-08-13 LAB — COMPREHENSIVE METABOLIC PANEL
Albumin: 2.7 g/dL — ABNORMAL LOW (ref 3.5–5.2)
Alkaline Phosphatase: 183 U/L — ABNORMAL HIGH (ref 39–117)
BUN: 5 mg/dL — ABNORMAL LOW (ref 6–23)
CO2: 26 mEq/L (ref 19–32)
Calcium: 8.1 mg/dL — ABNORMAL LOW (ref 8.4–10.5)
Chloride: 111 mEq/L (ref 96–112)
Creatinine, Ser: 0.81 mg/dL (ref 0.50–1.10)
GFR calc Af Amer: 82 mL/min — ABNORMAL LOW (ref >90)
GFR calc non Af Amer: 71 mL/min — ABNORMAL LOW (ref >90)
Glucose, Bld: 123 mg/dL — ABNORMAL HIGH (ref 70–99)
Potassium: 3.2 mEq/L — ABNORMAL LOW (ref 3.5–5.1)
Sodium: 146 mEq/L — ABNORMAL HIGH (ref 135–145)
Total Bilirubin: 0.9 mg/dL (ref 0.3–1.2)
Total Protein: 6 g/dL (ref 6.0–8.3)

## 2013-08-13 LAB — GLUCOSE, CAPILLARY
Glucose-Capillary: 100 mg/dL — ABNORMAL HIGH (ref 70–99)
Glucose-Capillary: 130 mg/dL — ABNORMAL HIGH (ref 70–99)
Glucose-Capillary: 77 mg/dL (ref 70–99)
Glucose-Capillary: 88 mg/dL (ref 70–99)
Glucose-Capillary: 90 mg/dL (ref 70–99)

## 2013-08-13 LAB — CBC
HCT: 34.8 % — ABNORMAL LOW (ref 36.0–46.0)
Hemoglobin: 11.4 g/dL — ABNORMAL LOW (ref 12.0–15.0)
MCV: 88.1 fL (ref 78.0–100.0)
RBC: 3.95 MIL/uL (ref 3.87–5.11)
WBC: 6.7 10*3/uL (ref 4.0–10.5)

## 2013-08-13 SURGERY — LAPAROSCOPIC CHOLECYSTECTOMY WITH INTRAOPERATIVE CHOLANGIOGRAM
Anesthesia: General | Site: Abdomen

## 2013-08-13 MED ORDER — LIDOCAINE HCL (CARDIAC) 20 MG/ML IV SOLN
INTRAVENOUS | Status: DC | PRN
  Administered 2013-08-13: 180 mg via INTRAVENOUS

## 2013-08-13 MED ORDER — LACTATED RINGERS IV SOLN
Freq: Once | INTRAVENOUS | Status: AC
  Administered 2013-08-13: 13:00:00 via INTRAVENOUS

## 2013-08-13 MED ORDER — NEOSTIGMINE METHYLSULFATE 1 MG/ML IJ SOLN
INTRAMUSCULAR | Status: DC | PRN
  Administered 2013-08-13: 4 mg via INTRAVENOUS

## 2013-08-13 MED ORDER — CHLORHEXIDINE GLUCONATE 4 % EX LIQD
1.0000 "application " | Freq: Once | CUTANEOUS | Status: AC
  Administered 2013-08-13: 1 via TOPICAL
  Filled 2013-08-13: qty 15

## 2013-08-13 MED ORDER — HEMOSTATIC AGENTS (NO CHARGE) OPTIME
TOPICAL | Status: DC | PRN
  Administered 2013-08-13: 1 via TOPICAL

## 2013-08-13 MED ORDER — BUPIVACAINE HCL (PF) 0.25 % IJ SOLN
INTRAMUSCULAR | Status: AC
  Filled 2013-08-13: qty 30

## 2013-08-13 MED ORDER — ONDANSETRON HCL 4 MG/2ML IJ SOLN
INTRAMUSCULAR | Status: DC | PRN
  Administered 2013-08-13: 4 mg via INTRAVENOUS

## 2013-08-13 MED ORDER — SODIUM CHLORIDE 0.9 % IR SOLN
Status: DC | PRN
  Administered 2013-08-13: 1000 mL

## 2013-08-13 MED ORDER — ROCURONIUM BROMIDE 100 MG/10ML IV SOLN
INTRAVENOUS | Status: DC | PRN
  Administered 2013-08-13: 5 mg via INTRAVENOUS
  Administered 2013-08-13: 40 mg via INTRAVENOUS

## 2013-08-13 MED ORDER — PROPOFOL 10 MG/ML IV BOLUS
INTRAVENOUS | Status: DC | PRN
  Administered 2013-08-13: 100 mg via INTRAVENOUS

## 2013-08-13 MED ORDER — GLYCOPYRROLATE 0.2 MG/ML IJ SOLN
INTRAMUSCULAR | Status: DC | PRN
  Administered 2013-08-13: .6 mg via INTRAVENOUS

## 2013-08-13 MED ORDER — BUPIVACAINE HCL (PF) 0.25 % IJ SOLN
INTRAMUSCULAR | Status: DC | PRN
  Administered 2013-08-13: 25 mL

## 2013-08-13 MED ORDER — SODIUM CHLORIDE 0.9 % IV SOLN
INTRAVENOUS | Status: DC | PRN
  Administered 2013-08-13: 16:00:00

## 2013-08-13 MED ORDER — LACTATED RINGERS IV SOLN
INTRAVENOUS | Status: DC
  Administered 2013-08-13: 14:00:00 via INTRAVENOUS

## 2013-08-13 MED ORDER — POTASSIUM CHLORIDE 10 MEQ/100ML IV SOLN
10.0000 meq | INTRAVENOUS | Status: AC
  Administered 2013-08-13 (×3): 10 meq via INTRAVENOUS
  Filled 2013-08-13 (×3): qty 100

## 2013-08-13 MED ORDER — FENTANYL CITRATE 0.05 MG/ML IJ SOLN
INTRAMUSCULAR | Status: DC | PRN
  Administered 2013-08-13 (×2): 25 ug via INTRAVENOUS
  Administered 2013-08-13: 100 ug via INTRAVENOUS

## 2013-08-13 SURGICAL SUPPLY — 39 items
ADH SKN CLS APL DERMABOND .7 (GAUZE/BANDAGES/DRESSINGS) ×1
APPLIER CLIP ROT 10 11.4 M/L (STAPLE) ×2
APR CLP MED LRG 11.4X10 (STAPLE) ×1
BAG SPEC RTRVL LRG 6X4 10 (ENDOMECHANICALS) ×1
BLADE SURG ROTATE 9660 (MISCELLANEOUS) IMPLANT
CANISTER SUCTION 2500CC (MISCELLANEOUS) ×2 IMPLANT
CHLORAPREP W/TINT 26ML (MISCELLANEOUS) ×2 IMPLANT
CLIP APPLIE ROT 10 11.4 M/L (STAPLE) ×1 IMPLANT
COVER MAYO STAND STRL (DRAPES) ×2 IMPLANT
COVER SURGICAL LIGHT HANDLE (MISCELLANEOUS) ×2 IMPLANT
DECANTER SPIKE VIAL GLASS SM (MISCELLANEOUS) ×2 IMPLANT
DERMABOND ADVANCED (GAUZE/BANDAGES/DRESSINGS) ×1
DERMABOND ADVANCED .7 DNX12 (GAUZE/BANDAGES/DRESSINGS) ×1 IMPLANT
DRAPE C-ARM 42X72 X-RAY (DRAPES) ×2 IMPLANT
DRAPE UTILITY 15X26 W/TAPE STR (DRAPE) ×4 IMPLANT
ELECT REM PT RETURN 9FT ADLT (ELECTROSURGICAL) ×2
ELECTRODE REM PT RTRN 9FT ADLT (ELECTROSURGICAL) ×1 IMPLANT
FILTER SMOKE EVAC LAPAROSHD (FILTER) IMPLANT
GLOVE EUDERMIC 7 POWDERFREE (GLOVE) ×2 IMPLANT
GOWN STRL NON-REIN LRG LVL3 (GOWN DISPOSABLE) ×6 IMPLANT
GOWN STRL REIN XL XLG (GOWN DISPOSABLE) ×2 IMPLANT
HEMOSTAT SNOW SURGICEL 2X4 (HEMOSTASIS) ×1 IMPLANT
KIT BASIN OR (CUSTOM PROCEDURE TRAY) ×2 IMPLANT
KIT ROOM TURNOVER OR (KITS) ×2 IMPLANT
NS IRRIG 1000ML POUR BTL (IV SOLUTION) ×2 IMPLANT
PAD ARMBOARD 7.5X6 YLW CONV (MISCELLANEOUS) ×2 IMPLANT
POUCH SPECIMEN RETRIEVAL 10MM (ENDOMECHANICALS) ×2 IMPLANT
SCISSORS LAP 5X35 DISP (ENDOMECHANICALS) ×2 IMPLANT
SET CHOLANGIOGRAPH 5 50 .035 (SET/KITS/TRAYS/PACK) ×2 IMPLANT
SET IRRIG TUBING LAPAROSCOPIC (IRRIGATION / IRRIGATOR) ×2 IMPLANT
SLEEVE ENDOPATH XCEL 5M (ENDOMECHANICALS) ×2 IMPLANT
SPECIMEN JAR SMALL (MISCELLANEOUS) ×2 IMPLANT
SUT MNCRL AB 4-0 PS2 18 (SUTURE) ×2 IMPLANT
TOWEL OR 17X24 6PK STRL BLUE (TOWEL DISPOSABLE) ×2 IMPLANT
TOWEL OR 17X26 10 PK STRL BLUE (TOWEL DISPOSABLE) ×2 IMPLANT
TRAY LAPAROSCOPIC (CUSTOM PROCEDURE TRAY) ×2 IMPLANT
TROCAR XCEL BLUNT TIP 100MML (ENDOMECHANICALS) ×2 IMPLANT
TROCAR XCEL NON-BLD 11X100MML (ENDOMECHANICALS) ×2 IMPLANT
TROCAR XCEL NON-BLD 5MMX100MML (ENDOMECHANICALS) ×2 IMPLANT

## 2013-08-13 NOTE — Anesthesia Postprocedure Evaluation (Signed)
  Anesthesia Post-op Note  Patient: Tracy [name]  Procedure(s) Performed: Procedure(s) (LRB): LAPAROSCOPIC CHOLECYSTECTOMY  (N/A)  Patient Location: PACU  Anesthesia Type: General  Level of Consciousness: awake and alert   Airway and Oxygen Therapy: Patient Spontanous Breathing  Post-op Pain: mild  Post-op Assessment: Post-op Vital signs reviewed, Patient's Cardiovascular Status Stable, Respiratory Function Stable, Patent Airway and No signs of Nausea or vomiting  Last Vitals:  Filed Vitals:   08/13/13 1115  BP: 131/74  Pulse: 88  Temp: 36.9 C  Resp: 20    Post-op Vital Signs: stable   Complications: No apparent anesthesia complications

## 2013-08-13 NOTE — Preoperative (Signed)
Beta Blockers   Reason not to administer Beta Blockers:Not Applicable 

## 2013-08-13 NOTE — Transfer of Care (Signed)
Immediate Anesthesia Transfer of Care Note  Patient: Tracy [name]  Procedure(s) Performed: Procedure(s): LAPAROSCOPIC CHOLECYSTECTOMY  (N/A)  Patient Location: PACU  Anesthesia Type:General  Level of Consciousness: sedated  Airway & Oxygen Therapy: Patient Spontanous Breathing and Patient connected to face mask oxygen  Post-op Assessment: Report given to PACU RN and Post -op Vital signs reviewed and stable  Post vital signs: Reviewed and stable  Complications: No apparent anesthesia complications

## 2013-08-13 NOTE — Anesthesia Preprocedure Evaluation (Signed)
Anesthesia Evaluation  Patient identified by MRN, date of birth, ID band Patient awake    Reviewed: Allergy & Precautions, H&P , NPO status , Patient's Chart, lab work & pertinent test results  Airway Mallampati: II TM Distance: >3 FB Neck ROM: Full    Dental no notable dental hx. (+) Poor Dentition and Missing   Pulmonary neg pulmonary ROS,  breath sounds clear to auscultation  Pulmonary exam normal       Cardiovascular hypertension, Pt. on medications Rhythm:Regular Rate:Normal     Neuro/Psych CVA (weak on R), Residual Symptoms negative psych ROS   GI/Hepatic negative GI ROS, Neg liver ROS,   Endo/Other  diabetes, Type 2, Oral Hypoglycemic Agents  Renal/GU negative Renal ROS  negative genitourinary   Musculoskeletal negative musculoskeletal ROS (+)   Abdominal   Peds negative pediatric ROS (+)  Hematology negative hematology ROS (+)   Anesthesia Other Findings   Reproductive/Obstetrics negative OB ROS                           Anesthesia Physical Anesthesia Plan  ASA: III  Anesthesia Plan: General   Post-op Pain Management:    Induction: Intravenous  Airway Management Planned: Oral ETT  Additional Equipment:   Intra-op Plan:   Post-operative Plan: Extubation in OR  Informed Consent: I have reviewed the patients History and Physical, chart, labs and discussed the procedure including the risks, benefits and alternatives for the proposed anesthesia with the patient or authorized representative who has indicated his/her understanding and acceptance.   Dental advisory given  Plan Discussed with: CRNA  Anesthesia Plan Comments:         Anesthesia Quick Evaluation

## 2013-08-13 NOTE — Progress Notes (Signed)
Subjective: No acute events.  No pain after the ERCP.  Objective: Vital signs in last 24 hours: Temp:  [97.8 F (36.6 C)-99.1 F (37.3 C)] 98.2 F (36.8 C) (12/03 0412) Pulse Rate:  [78-120] 93 (12/03 0412) Resp:  [13-25] 20 (12/03 0412) BP: (113-200)/(65-119) 122/66 mmHg (12/03 0412) SpO2:  [81 %-100 %] 100 % (12/03 0412) Weight:  [214 lb 15.2 oz (97.5 kg)] 214 lb 15.2 oz (97.5 kg) (12/02 2102) Last BM Date: 08/09/13  Intake/Output from previous day: 12/02 0701 - 12/03 0700 In: 2606.7 [P.O.:100; I.V.:2306.7; IV Piggyback:200] Out: -  Intake/Output this shift:    General appearance: alert and no distress GI: soft, non-tender; bowel sounds normal; no masses,  no organomegaly  Lab Results:  Recent Labs  08/11/13 0610 08/12/13 0520 08/13/13 0500  WBC 4.8 4.9 6.7  HGB 10.8* 11.3* 11.4*  HCT 32.5* 33.7* 34.8*  PLT 341 412* 388   BMET  Recent Labs  08/11/13 0610 08/12/13 0520 08/13/13 0520  NA 144 144 146*  K 3.2* 3.2* 3.2*  CL 106 106 111  CO2 26 27 26   GLUCOSE 130* 110* 123*  BUN 8 5* 5*  CREATININE 0.78 0.79 0.81  CALCIUM 8.5 8.8 8.1*   LFT  Recent Labs  08/13/13 0520  PROT 6.0  ALBUMIN 2.7*  AST 31  ALT 68*  ALKPHOS 183*  BILITOT 0.9   PT/INR No results found for this basename: LABPROT, INR,  in the last 72 hours Hepatitis Panel No results found for this basename: HEPBSAG, HCVAB, HEPAIGM, HEPBIGM,  in the last 72 hours C-Diff No results found for this basename: CDIFFTOX,  in the last 72 hours Fecal Lactopherrin No results found for this basename: FECLLACTOFRN,  in the last 72 hours  Studies/Results: No results found.  Medications:  Scheduled: . buPROPion  150 mg Oral Daily  . [START ON 08/14/2013] chlorhexidine  1 application Topical Once  . heparin  5,000 Units Subcutaneous Q8H  . insulin aspart  0-15 Units Subcutaneous Q4H  . PARoxetine  40 mg Oral Daily  . piperacillin-tazobactam (ZOSYN)  IV  3.375 g Intravenous Q8H  . potassium  chloride  10 mEq Intravenous Q1 Hr x 3   Continuous: . dextrose 5 % and 0.9% NaCl 100 mL/hr at 08/12/13 1857    Assessment/Plan: 1) Choledocholithiasis s/p ERCP stone extraction. 2) Acute cholecystitis.   She is well at this time.  No post ERCP pancreatitis.  Plan: 1) Lap chole per Surgery. 2) Signing off.   LOS: 4 days   Tracy [name] 08/13/2013, 8:32 AM

## 2013-08-13 NOTE — Progress Notes (Signed)
Acute cholecystitis  Assessment: Acute calculous cholecystitis Hypokalemia Mild confusion, possibly related to anesthesia from ERCP yesterday   Plan: Replete KCl Would like to proceed to lap chole today. Discussed with patient, but will also talk to family    Subjective: Feels well, improved from yesterday  Objective: Vital signs in last 24 hours: Temp:  [97.8 F (36.6 C)-99.1 F (37.3 C)] 98.2 F (36.8 C) (12/03 0412) Pulse Rate:  [78-120] 93 (12/03 0412) Resp:  [13-25] 20 (12/03 0412) BP: (113-200)/(65-119) 122/66 mmHg (12/03 0412) SpO2:  [81 %-100 %] 100 % (12/03 0412) Weight:  [214 lb 15.2 oz (97.5 kg)] 214 lb 15.2 oz (97.5 kg) (12/02 2102) Last BM Date: 08/09/13  Intake/Output from previous day: 12/02 0701 - 12/03 0700 In: 2606.7 [P.O.:100; I.V.:2306.7; IV Piggyback:200] Out: -   General appearance: alert, cooperative, no distress and possible mild confusion GI: soft, non-tender; bowel sounds normal; no masses,  no organomegaly  Lab Results:  Results for orders placed during the hospital encounter of 08/09/13 (from the past 24 hour(s))  SURGICAL PCR SCREEN     Status: None   Collection Time    08/12/13 10:55 AM      Result Value Range   MRSA, PCR NEGATIVE  NEGATIVE   Staphylococcus aureus NEGATIVE  NEGATIVE  GLUCOSE, CAPILLARY     Status: Abnormal   Collection Time    08/12/13 11:47 AM      Result Value Range   Glucose-Capillary 110 (*) 70 - 99 mg/dL  GLUCOSE, CAPILLARY     Status: None   Collection Time    08/12/13  5:35 PM      Result Value Range   Glucose-Capillary 95  70 - 99 mg/dL   Comment 1 Notify RN    GLUCOSE, CAPILLARY     Status: Abnormal   Collection Time    08/12/13  8:10 PM      Result Value Range   Glucose-Capillary 159 (*) 70 - 99 mg/dL   Comment 1 Documented in Chart     Comment 2 Notify RN    GLUCOSE, CAPILLARY     Status: None   Collection Time    08/13/13 12:09 AM      Result Value Range   Glucose-Capillary 77  70 - 99 mg/dL    Comment 1 Documented in Chart     Comment 2 Notify RN    GLUCOSE, CAPILLARY     Status: Abnormal   Collection Time    08/13/13  4:08 AM      Result Value Range   Glucose-Capillary 102 (*) 70 - 99 mg/dL   Comment 1 Documented in Chart     Comment 2 Notify RN    CBC     Status: Abnormal   Collection Time    08/13/13  5:00 AM      Result Value Range   WBC 6.7  4.0 - 10.5 K/uL   RBC 3.95  3.87 - 5.11 MIL/uL   Hemoglobin 11.4 (*) 12.0 - 15.0 g/dL   HCT 40.9 (*) 81.1 - 91.4 %   MCV 88.1  78.0 - 100.0 fL   MCH 28.9  26.0 - 34.0 pg   MCHC 32.8  30.0 - 36.0 g/dL   RDW 78.2 (*) 95.6 - 21.3 %   Platelets 388  150 - 400 K/uL  COMPREHENSIVE METABOLIC PANEL     Status: Abnormal   Collection Time    08/13/13  5:20 AM      Result Value  Range   Sodium 146 (*) 135 - 145 mEq/L   Potassium 3.2 (*) 3.5 - 5.1 mEq/L   Chloride 111  96 - 112 mEq/L   CO2 26  19 - 32 mEq/L   Glucose, Bld 123 (*) 70 - 99 mg/dL   BUN 5 (*) 6 - 23 mg/dL   Creatinine, Ser 5.78  0.50 - 1.10 mg/dL   Calcium 8.1 (*) 8.4 - 10.5 mg/dL   Total Protein 6.0  6.0 - 8.3 g/dL   Albumin 2.7 (*) 3.5 - 5.2 g/dL   AST 31  0 - 37 U/L   ALT 68 (*) 0 - 35 U/L   Alkaline Phosphatase 183 (*) 39 - 117 U/L   Total Bilirubin 0.9  0.3 - 1.2 mg/dL   GFR calc non Af Amer 71 (*) >90 mL/min   GFR calc Af Amer 82 (*) >90 mL/min  MAGNESIUM     Status: None   Collection Time    08/13/13  5:20 AM      Result Value Range   Magnesium 1.6  1.5 - 2.5 mg/dL     Studies/Results Radiology     MEDS, Scheduled . buPROPion  150 mg Oral Daily  . heparin  5,000 Units Subcutaneous Q8H  . insulin aspart  0-15 Units Subcutaneous Q4H  . PARoxetine  40 mg Oral Daily  . piperacillin-tazobactam (ZOSYN)  IV  3.375 g Intravenous Q8H       LOS: 4 days    Currie Paris, MD, [facility], [place] [phone]   08/13/2013 7:38 AM

## 2013-08-13 NOTE — Op Note (Signed)
Tracy [name] [birth year] [id number] 08/13/2013  Preoperative diagnosis: Acute calculus cholecystitis, status post ERCP and retrieval of common duct stones  Postoperative diagnosis: Same  Procedure: Laparoscopic cholecystectomy, attempted intraoperative cholangiogram  Surgeon: Currie Paris, MD, FACS  Assistant surgeon: Magnus Ivan, RNFA   Anesthesia: General  Clinical History and Indications: This patient presented with acute cholecystitis and common duct stones. She underwent ERCP and retrieval of the common duct stones yesterday. SheComes to the operating room today for cholecystectomy Description of procedure: The patient was seen in the preoperative area. I reviewed the plans for the procedure with her as well as the risks and complications. She had no further questions and wished to proceed.  The patient was taken to the operating room. After satisfactory general endotracheal anesthesia had been obtained the abdomen was prepped and draped. A time out was done.  0.25% plain Marcaine was used at all incisions. I made an umbilical incision, identified the fascia and opened that, and entered the peritoneal cavity under direct vision. A 0 Vicryl pursestring suture was placed and the Hasson cannula was introduced under direct vision and secured with the pursestring. The abdomen was inflated to 15 cm.  The camera was placed and there were no gross abnormalities. The patient was then placed in reverse Trendelenburg and tilted to the left. A 10/11 trocar was placed in the epigastrium and two 5 mm trochars placed laterally all under direct vision.  The gallbladder was edematous and inflamed consistent with acute appendicitis. There are multiple omental adhesions that were stuck to the gallbladder which had to be gently dissected off. I was able to then open the peritoneum in the area of the cystic duct and found the cystic duct lymph node was dissected off. Was able to have a long  segment of cystic duct and a section of the cystic artery identified with a window behind both of them confirming the anatomy. Clip on the artery and one on the duct at the junction with the gallbladder. There was a stone obstructing the cystic duct just inside the gallbladder. I opened the cystic duct an attempt to do an operative plan TRAM with the catheter but was unable to get it successfully threaded into the duct. I thought the anatomy was clear so decided not to pursue cholangiogram any further.  An intraoperative cholangiogram was then performed. A Cook catheter was introduced percutaneously and placed in the cystic duct. The cholangiogram showed good filling of the common duct and hepatic radicals and free flow into the duodenum. No abnormalities were noted.  The cystic duct was clipped 3 more times on the stay side and divided. 2 additional clips are placed on the cystic artery and it was divided.  The gallbladder was then removed from below to above the coagulation current of the cautery. It was then placed in a bag to be retrieved later. There is a large raw surface because the gallbladder was acutely inflamed and there was no good dissection plane. I irrigated copiously and used the cautery to be sure he then was dry. Impotence no in place.  The abdomen was irrigated and a check for hemostasis along the bed of the gallbladder made. Once everything appeared to be dry we were able to move the camera to the epigastric port and removed the gallbladder through the umbilical port.  The abdomen was reinsufflated and a final check for hemostasis made. There is no evidence of bleeding or bile leakage. The lateral ports were removed under direct  vision and there was no bleeding. The umbilical site was closed with a pursestring, watching with the camera in the epigastric port. The abdomen was then deflated through the epigastric port and that was removed. Skin was closed with 4-0 Monocryl subcuticular  and Dermabond.  The patient tolerated the procedure well. There were no operative complications. EBL was minimal. All counts were correct.  Currie Paris, MD, FACS 08/13/2013 4:10 PM

## 2013-08-13 NOTE — Anesthesia Procedure Notes (Addendum)
Procedure Name: Intubation Date/Time: 08/13/2013 2:51 PM Performed by: Armandina Gemma Pre-anesthesia Checklist: Patient identified, Emergency Drugs available, Suction available, Patient being monitored and Timeout performed Patient Re-evaluated:Patient Re-evaluated prior to inductionOxygen Delivery Method: Circle system utilized Preoxygenation: Pre-oxygenation with 100% oxygen Intubation Type: IV induction Ventilation: Mask ventilation without difficulty Laryngoscope Size: Miller and 2 Grade View: Grade I Tube type: Oral Tube size: 7.0 mm Number of attempts: 1 Airway Equipment and Method: Stylet Placement Confirmation: ETT inserted through vocal cords under direct vision,  breath sounds checked- equal and bilateral and positive ETCO2 Secured at: 21 cm Tube secured with: Tape Dental Injury: Teeth and Oropharynx as per pre-operative assessment  Comments: IV induction- MDA Carignan - intubation AM CRNA- atraumatic- pt with very poor dentition- many existing teeth very loose prior to laryngoscopy- teeth as preop after laryngoscopy

## 2013-08-13 NOTE — Progress Notes (Signed)
[facility] PROGRESS NOTE  Tracy [name] NFA:[id number] DOB: Nov 07, 1940 DOA: 08/09/2013 PCP: Geraldo Pitter, MD  Brief history  72 year old female with a history of diabetes mellitus, hypertension with prior CVA, presents to the ED with 2 week history of intermittent epigastric and right upper quadrant pain. The patient is a poor historian, but she has been complaining ofintermittent nausea, vomiting, and worsening pain after meals for the past 2-3 weeks. The patient denies any fevers, chills, hematemesis, diarrhea, hematochezia, melena, dysuria, hematuria. Evaluation in the ED revealed AST 36, ALT 141, alkaline phosphatase 244, total bilirubin to 3.2. CT of the abdomen and pelvis showed gallbladder distention, gallbladder wall thickening with pericholecystic stranding concerning for acute cholecystitis. General surgery was consulted. They recommended MRCP. MRCP revealed 2 tiny stones in the common bile duct. The patient has been on Zosyn with improvement of her WBC.  ERCP was performed by Dr. Elnoria Howard on 08/12/13 whom removed 2 stones from CBD.  Assessment/Plan: Acute cholecystitis  -On IV Zosyn  -Had ERCP yesterday, for lap cholecystectomy today.  Choledocholithiasis -12//2/14--ERCP (Dr. Hung)-->removed 2 stones  Pyuria  -urine culture negative   Leukocytosis  -Secondary to  cholecystitis  -Resolved  Diabetes mellitus, type II  -Hemoglobin A1c--6.4 -NovoLog sliding scale  -Well controlled.  History of stroke  -Hold Plavix in anticipation for surgery   Hypertension  -Discontinue antihypertensive medications as the patient's blood pressure is soft--may need to restart antihypertensive medications if BP increases -pt only on spironolactone -Well-controlled.  Hypokalemia -Replete -Check magnesium  Family Communication: Son at bedside updated on plan of care Disposition Plan: Home when medically stable , anticipate 24-48 hours.  Antibiotics:  mefoxin 08/10/13  Zosyn  08/10/13>>>         Procedures/Studies: Ct Abdomen Pelvis W Contrast  08/10/2013   CLINICAL DATA:  Mid abdominal pain for several weeks. Periumbilical pain.  EXAM: CT ABDOMEN AND PELVIS WITH CONTRAST  TECHNIQUE: Multidetector CT imaging of the abdomen and pelvis was performed using the standard protocol following bolus administration of intravenous contrast.  CONTRAST:  OMNIPAQUE IOHEXOL 300 MG/ML  SOLN  COMPARISON:  12/23/2008  FINDINGS: Lung bases are clear allowing for motion artifact. Streak artifact from the arms limits visualization of some areas. Low-attenuation lesions in the liver are with anterior segment right lobe of liver lesion measuring 9 mm and inferior right lobe of liver lesion 2.8 cm. These are likely cysts and were present previously without change.  The gallbladder is mildly distended and filled with sludge. The gallbladder wall is diffusely thickened and edematous with pericholecystic stranding. Changes are consistent with acute cholecystitis. No bile duct dilatation. The spleen, pancreas, adrenal glands, kidneys, abdominal aorta, inferior vena cava, and retroperitoneal lymph nodes are unremarkable. The stomach, small bowel, and colon are not abnormally distended. Contrast material flows to the distal colon without evidence of obstruction. No free air or free fluid in the abdomen.  Pelvis: Bladder wall is not thickened. Uterus and ovaries are not enlarged. Appendix is normal. No evidence of diverticulitis. No free or loculated pelvic fluid collections. Small umbilical hernia containing fat. Degenerative changes in the lumbar spine. Spondylolysis with mild spondylolisthesis at L4-5. No destructive bone lesions appreciated.  IMPRESSION: Thick edematous gallbladder wall with gallbladder distention and sludge and pericholecystic inflammatory stranding. Changes are consistent with acute cholecystitis.   Electronically Signed   By: Burman Nieves M.D.   On: 08/10/2013 03:15   Mr  3d Recon At Scanner  08/11/2013   CLINICAL DATA:  Right upper quadrant  pain radiating to back. Nausea and vomiting. Cholecystitis. Elevated liver function tests. Clinical suspicion for choledocholithiasis.  EXAM: MRI ABDOMEN WITHOUT AND WITH CONTRAST (INCLUDING MRCP)  TECHNIQUE: Multiplanar multisequence MR imaging of the abdomen was performed both before and after the administration of intravenous contrast. Heavily T2-weighted images of the biliary and pancreatic ducts were obtained, and three-dimensional MRCP images were rendered by post processing.  CONTRAST:  20mL MULTIHANCE GADOBENATE DIMEGLUMINE 529 MG/ML IV SOLN  COMPARISON:  CT on 08/10/2013  FINDINGS: Numerous tiny gallstones are seen. Gallbladder is distended and demonstrates diffuse wall thickening and mild pericholecystic inflammatory changes, consistent with acute cholecystitis.  Mild biliary ductal dilatation is seen with common bile duct measuring 11 mm. At least 2 tiny less than 1 cm calculi are seen within the distal common bile duct on thin slab coronal and axial MRCP sequences.  No evidence of pancreatic mass or pancreatic ductal dilatation. No evidence of pancreas divisum. Several small hepatic cysts are noted, but no liver mass or abscess identified. The spleen, adrenal glands, and kidneys are normal in appearance. No soft tissue masses or lymphadenopathy identified.  IMPRESSION: Acute cholecystitis.  Mild biliary ductal dilatation, with at least 2 tiny less than 1 cm calculi in distal common bile duct.  Several benign hepatic cysts incidentally noted. No other significant abnormality identified.   Electronically Signed   By: Myles Rosenthal M.D.   On: 08/11/2013 09:23   Mr Abd W/wo Cm/mrcp  08/11/2013   CLINICAL DATA:  Right upper quadrant pain radiating to back. Nausea and vomiting. Cholecystitis. Elevated liver function tests. Clinical suspicion for choledocholithiasis.  EXAM: MRI ABDOMEN WITHOUT AND WITH CONTRAST (INCLUDING MRCP)  TECHNIQUE:  Multiplanar multisequence MR imaging of the abdomen was performed both before and after the administration of intravenous contrast. Heavily T2-weighted images of the biliary and pancreatic ducts were obtained, and three-dimensional MRCP images were rendered by post processing.  CONTRAST:  20mL MULTIHANCE GADOBENATE DIMEGLUMINE 529 MG/ML IV SOLN  COMPARISON:  CT on 08/10/2013  FINDINGS: Numerous tiny gallstones are seen. Gallbladder is distended and demonstrates diffuse wall thickening and mild pericholecystic inflammatory changes, consistent with acute cholecystitis.  Mild biliary ductal dilatation is seen with common bile duct measuring 11 mm. At least 2 tiny less than 1 cm calculi are seen within the distal common bile duct on thin slab coronal and axial MRCP sequences.  No evidence of pancreatic mass or pancreatic ductal dilatation. No evidence of pancreas divisum. Several small hepatic cysts are noted, but no liver mass or abscess identified. The spleen, adrenal glands, and kidneys are normal in appearance. No soft tissue masses or lymphadenopathy identified.  IMPRESSION: Acute cholecystitis.  Mild biliary ductal dilatation, with at least 2 tiny less than 1 cm calculi in distal common bile duct.  Several benign hepatic cysts incidentally noted. No other significant abnormality identified.   Electronically Signed   By: Myles Rosenthal M.D.   On: 08/11/2013 09:23         Subjective: Patient is groggy after ERCP. She arouses easily. Denies fevers, chills, shortness breath, vomiting, diarrhea.  Objective: Filed Vitals:   08/12/13 2102 08/13/13 0412 08/13/13 0934 08/13/13 1115  BP:  122/66 129/71 131/74  Pulse:  93 78 88  Temp:  98.2 F (36.8 C) 97.6 F (36.4 C) 98.4 F (36.9 C)  TempSrc:  Oral Oral Oral  Resp:  20 18 20   Height: 5\' 6"  (1.676 m)     Weight: 97.5 kg (214 lb 15.2 oz)  SpO2:  100% 96% 97%    Intake/Output Summary (Last 24 hours) at 08/13/13 1151 Last data filed at 08/13/13  0935  Gross per 24 hour  Intake 2726.67 ml  Output      0 ml  Net 2726.67 ml   Weight change: 0.4 kg (14.1 oz) Exam:   General:  Pt is alert, follows commands appropriately, not in acute distress  HEENT: No icterus,  [place]/AT  Cardiovascular: RRR, S1/S2, no rubs, no gallops  Respiratory: CTA bilaterally, no wheezing, no crackles, no rhonchi  Abdomen: Soft/+BS, non tender, non distended, no guarding  Extremities: No edema, No lymphangitis, No petechiae, No rashes, no synovitis  Data Reviewed: Basic Metabolic Panel:  Recent Labs Lab 08/07/13 2219 08/09/13 2315 08/10/13 0702 08/11/13 0610 08/12/13 0520 08/13/13 0520  NA 136 135  --  144 144 146*  K 3.5 3.6  --  3.2* 3.2* 3.2*  CL 98 97  --  106 106 111  CO2 27 24  --  26 27 26   GLUCOSE 143* 102*  --  130* 110* 123*  BUN 18 18  --  8 5* 5*  CREATININE 0.88 0.88 0.81 0.78 0.79 0.81  CALCIUM 9.3 9.0  --  8.5 8.8 8.1*  MG  --   --   --   --   --  1.6   Liver Function Tests:  Recent Labs Lab 08/09/13 2315 08/11/13 0610 08/12/13 0520 08/13/13 0520  AST 86* 70* 54* 31  ALT 141* 110* 103* 68*  ALKPHOS 244* 232* 248* 183*  BILITOT 3.2* 3.1* 1.4* 0.9  PROT 7.3 6.5 6.8 6.0  ALBUMIN 3.6 2.9* 3.2* 2.7*    Recent Labs Lab 08/09/13 2315  LIPASE 30   No results found for this basename: AMMONIA,  in the last 168 hours CBC:  Recent Labs Lab 08/07/13 2219 08/09/13 2315 08/10/13 0702 08/11/13 0610 08/12/13 0520 08/13/13 0500  WBC 11.7* 11.5* 9.1 4.8 4.9 6.7  NEUTROABS 9.4* 9.5*  --   --   --   --   HGB 12.5 11.7* 11.1* 10.8* 11.3* 11.4*  HCT 37.1 34.3* 32.6* 32.5* 33.7* 34.8*  MCV 86.1 85.8 84.9 86.4 86.4 88.1  PLT 359 287 323 341 412* 388   Cardiac Enzymes: No results found for this basename: CKTOTAL, CKMB, CKMBINDEX, TROPONINI,  in the last 168 hours BNP: No components found with this basename: POCBNP,  CBG:  Recent Labs Lab 08/12/13 2010 08/13/13 0009 08/13/13 0408 08/13/13 0730 08/13/13 1113   GLUCAP 159* 77 102* 88 100*    Recent Results (from the past 240 hour(s))  URINE CULTURE     Status: None   Collection Time    08/10/13  3:43 AM      Result Value Range Status   Specimen Description URINE, RANDOM   Final   Special Requests NONE   Final   Culture  Setup Time     Final   Value: 08/10/2013 13:20     Performed at [facility] Count     Final   Value: NO GROWTH     Performed at [facility]   Culture     Final   Value: NO GROWTH     Performed at [facility]   Report Status 08/11/2013 FINAL   Final  SURGICAL PCR SCREEN     Status: None   Collection Time    08/12/13 10:55 AM      Result Value Range Status  MRSA, PCR NEGATIVE  NEGATIVE Final   Staphylococcus aureus NEGATIVE  NEGATIVE Final   Comment:            The Xpert SA Assay (FDA     approved for NASAL specimens     in patients over 18 years of age),     is one component of     a comprehensive surveillance     program.  Test performance has     been validated by [employer] for patients greater     than or equal to 71 year old.     It is not intended     to diagnose infection nor to     guide or monitor treatment.     Scheduled Meds: . [facility] HOLD] buPROPion  150 mg Oral Daily  . [MAR HOLD] heparin  5,000 Units Subcutaneous Q8H  . [MAR HOLD] insulin aspart  0-15 Units Subcutaneous Q4H  . lactated ringers   Intravenous Once  . [MAR HOLD] PARoxetine  40 mg Oral Daily  . [MAR HOLD] piperacillin-tazobactam (ZOSYN)  IV  3.375 g Intravenous Q8H  . potassium chloride  10 mEq Intravenous Q1 Hr x 3   Continuous Infusions: . dextrose 5 % and 0.9% NaCl 100 mL/hr at 08/12/13 1857    time spent: 35 minutes. Greater than 50% of this time was spent in direct contact with patient coordinating her care.  Chaya Jan, MD  [facility] Pager [phone]  If 7PM-7AM, please contact night-coverage [email or web address] Password [email or web address] 08/13/2013, 11:51 AM   LOS: 4  days

## 2013-08-14 ENCOUNTER — Encounter (HOSPITAL_COMMUNITY): Payer: Self-pay | Admitting: Surgery

## 2013-08-14 LAB — GLUCOSE, CAPILLARY
Glucose-Capillary: 115 mg/dL — ABNORMAL HIGH (ref 70–99)
Glucose-Capillary: 120 mg/dL — ABNORMAL HIGH (ref 70–99)

## 2013-08-14 LAB — COMPREHENSIVE METABOLIC PANEL
Albumin: 2.6 g/dL — ABNORMAL LOW (ref 3.5–5.2)
Alkaline Phosphatase: 154 U/L — ABNORMAL HIGH (ref 39–117)
BUN: 6 mg/dL (ref 6–23)
Calcium: 8.1 mg/dL — ABNORMAL LOW (ref 8.4–10.5)
Creatinine, Ser: 0.85 mg/dL (ref 0.50–1.10)
GFR calc Af Amer: 77 mL/min — ABNORMAL LOW (ref >90)
Potassium: 3.1 mEq/L — ABNORMAL LOW (ref 3.5–5.1)
Total Bilirubin: 0.9 mg/dL (ref 0.3–1.2)
Total Protein: 5.9 g/dL — ABNORMAL LOW (ref 6.0–8.3)

## 2013-08-14 LAB — CBC
Hemoglobin: 10.5 g/dL — ABNORMAL LOW (ref 12.0–15.0)
MCV: 88.1 fL (ref 78.0–100.0)
Platelets: 381 10*3/uL (ref 150–400)
RBC: 3.7 MIL/uL — ABNORMAL LOW (ref 3.87–5.11)
RDW: 16.8 % — ABNORMAL HIGH (ref 11.5–15.5)
WBC: 8.8 10*3/uL (ref 4.0–10.5)

## 2013-08-14 MED ORDER — POTASSIUM CHLORIDE CRYS ER 20 MEQ PO TBCR
40.0000 meq | EXTENDED_RELEASE_TABLET | Freq: Once | ORAL | Status: AC
  Administered 2013-08-14: 40 meq via ORAL
  Filled 2013-08-14: qty 2

## 2013-08-14 MED ORDER — METRONIDAZOLE 500 MG PO TABS: 500.0000 mg | ORAL_TABLET | Freq: Three times a day (TID) | ORAL | Status: DC

## 2013-08-14 MED ORDER — CIPROFLOXACIN HCL 250 MG PO TABS: 250.0000 mg | ORAL_TABLET | Freq: Two times a day (BID) | ORAL | Status: DC

## 2013-08-14 NOTE — Care Management Note (Signed)
   CARE MANAGEMENT NOTE 08/14/2013  Patient:  Tracy [name], Tracy [name]   Account Number:  [id number]  Date Initiated:  08/14/2013  Documentation initiated by:  Amirra Herling  Subjective/Objective Assessment:   Pt with some confusion, lap Chol 12/3, stable and approaching d/c.     Action/Plan:   08/14/13 Met with pt and spoke with daughter, re d/c plans, pt has 7 children, all are able to assist at times. Pt husband is blind, two sons live at home and can assist. Family ready for d/c.   Anticipated DC Date:  08/14/2013   Anticipated DC Plan:  HOME/SELF CARE         Choice offered to / List presented to:             Status of service:  Completed, signed off Medicare Important Message given?   (If response is "NO", the following Medicare IM given date fields will be blank) Date Medicare IM given:   Date Additional Medicare IM given:    Discharge Disposition:  HOME/SELF CARE  Per UR Regulation:    If discussed at Long Length of Stay Meetings, dates discussed:    Comments:

## 2013-08-14 NOTE — Progress Notes (Signed)
1 Day Post-Op   Assessment: s/p Procedure(s): LAPAROSCOPIC CHOLECYSTECTOMY  Patient Active Problem List   Diagnosis Date Noted  . Choledocholithiasis 08/11/2013  . Acute cholecystitis 08/10/2013  . DM (diabetes mellitus) 08/10/2013  . HTN (hypertension) 08/10/2013  . History of CVA (cerebrovascular accident) 08/10/2013  . Cholecystitis 08/10/2013    Doing well post op lap chole  Plan: Would continue antibiotics about five more days - had significant acute cholecystitis.  Subjective: No abd c/o. Seems a bit confused still,   Objective: Vital signs in last 24 hours: Temp:  [97.6 F (36.4 C)-98.6 F (37 C)] 98.1 F (36.7 C) (12/04 0409) Pulse Rate:  [78-119] 115 (12/04 0409) Resp:  [17-22] 20 (12/04 0409) BP: (118-150)/(67-81) 123/69 mmHg (12/04 0409) SpO2:  [90 %-100 %] 92 % (12/04 0409) Weight:  [216 lb 0.8 oz (98 kg)] 216 lb 0.8 oz (98 kg) (12/03 2010)   Intake/Output from previous day: 12/03 0701 - 12/04 0700 In: 1860.1 [P.O.:60; I.V.:1700; IV Piggyback:100.1] Out: 25 [Blood:25]  General appearance: alert, cooperative and no distress GI: soft, non-tender; bowel sounds normal; no masses,  no organomegaly  Incision: healing well  Lab Results:   Recent Labs  08/13/13 0500 08/14/13 0541  WBC 6.7 8.8  HGB 11.4* 10.5*  HCT 34.8* 32.6*  PLT 388 381   BMET  Recent Labs  08/13/13 0520 08/14/13 0541  NA 146* 143  K 3.2* 3.1*  CL 111 107  CO2 26 23  GLUCOSE 123* 105*  BUN 5* 6  CREATININE 0.81 0.85  CALCIUM 8.1* 8.1*    MEDS, Scheduled . buPROPion  150 mg Oral Daily  . insulin aspart  0-15 Units Subcutaneous Q4H  . PARoxetine  40 mg Oral Daily  . potassium chloride  40 mEq Oral Once    Studies/Results: Dg Ercp With Sphincterotomy  08/13/2013   CLINICAL DATA:  Cholecystitis and possible choledocholithiasis by MRCP.  EXAM: ERCP  TECHNIQUE: Multiple spot images obtained with the fluoroscopic device and submitted for interpretation post-procedure.   COMPARISON:  MRCP exam on 08/11/2013  FINDINGS: Images obtained with a C-arm during the ERCP procedure demonstrate cannulation of the common bile duct with contrast injection showing no visible filling defects. No biliary ductal dilatation is identified. No extravasation was seen. Per the operative note, a balloon sweep maneuver was performed.  IMPRESSION: Unremarkable ERCP images showing no filling defects or evidence of biliary ductal dilatation.  These images were submitted for radiologic interpretation only. Please see the procedural report for the amount of contrast and the fluoroscopy time utilized.   Electronically Signed   By: Irish Lack M.D.   On: 08/13/2013 08:40      LOS: 5 days     Currie Paris, MD, [facility], [place] [phone]   08/14/2013 8:18 AM

## 2013-08-14 NOTE — Discharge Summary (Signed)
Physician Discharge Summary  Tracy [name] YNW:[id number] DOB: December 14, 1940 DOA: 08/09/2013  PCP: Geraldo Pitter, MD  Admit date: 08/09/2013 Discharge date: 08/14/2013  Time spent: 45 minutes  Recommendations for Outpatient Follow-up:  -Will be discharged home today in stable condition. -Advised to follow up with PCP and surgery in 2 weeks. -HH services will be arranged as patient and family has refused SNF per PT/OT recommendations.  Discharge Diagnoses:  Principal Problem:   Acute cholecystitis Active Problems:   DM (diabetes mellitus)   HTN (hypertension)   History of CVA (cerebrovascular accident)   Cholecystitis   Choledocholithiasis   Discharge Condition: Stable and improved  Filed Weights   08/11/13 2011 08/12/13 2102 08/13/13 2010  Weight: 97.1 kg (214 lb 1.1 oz) 97.5 kg (214 lb 15.2 oz) 98 kg (216 lb 0.8 oz)    History of present illness:  Tracy [name] is an 72 y.o. female with hx of DM on oral hypoglycemic agent, HTN, prior CVA with minimal right sided paresis, returned to the ER with persistent epigastric, RUQ pain with back radiation, nausea, vomiting, with mild subjective fever. Evalaution included a mild LFT elevation with AST 86 ALT 144 and AP of 244. Her WBC was only 12K, with Hb of 12.5 g per dL, with normal lipase. She described her pain starting about 20 min after eating lasting about 30 minutes. Her abdominal pelvic CT showed sludge in the gallbladder, with stranding, but no discrete stone and no dilated duct. Her BS is controlled and her renal tests were normal. Surgery consulted by EDP and will see her in consultation, thinking that she may benefit from transcutaneous drainage tube, and hospitalist was asked to admit her due to multiple concomitant medical problems.    Hospital Course:   Acute cholecystitis  -DC IV Zosyn  -Had ERCP 12/2, lap chole 12/3.  -Patient feels well an is anxious to DC home today.  -Is tolerating her diet without  issue.  Choledocholithiasis  -12//2/14--ERCP (Dr. Hung)-->removed 2 stones   Pyuria  -urine culture negative  -No further abx required.   Leukocytosis  -Secondary to cholecystitis  -Resolved   Diabetes mellitus, type II  -Hemoglobin A1c--6.4  -NovoLog sliding scale was used while in the hospital. -Well controlled.   History of stroke  -Hold Plavix in anticipation for surgery   Hypertension  -Well-controlled.   Hypokalemia  -Repleted    Procedures:  ERCP  Lap Cholecystectomy   Consultations:  GI  Surgery  Discharge Instructions  Discharge Orders   Future Orders Complete By Expires   Discontinue IV  As directed    Increase activity slowly  As directed        Medication List    STOP taking these medications       ondansetron 4 MG tablet  Commonly known as:  ZOFRAN      TAKE these medications       acetaminophen 325 MG tablet  Commonly known as:  TYLENOL  Take 650 mg by mouth every 6 (six) hours as needed for pain.     buPROPion 150 MG 24 hr tablet  Commonly known as:  WELLBUTRIN XL  Take 150 mg by mouth daily.     ciprofloxacin 250 MG tablet  Commonly known as:  CIPRO  Take 1 tablet (250 mg total) by mouth 2 (two) times daily. For 5 days     clopidogrel 75 MG tablet  Commonly known as:  PLAVIX  Take 75 mg by mouth every evening.  loratadine 10 MG tablet  Commonly known as:  CLARITIN  Take 10 mg by mouth daily.     metFORMIN 500 MG tablet  Commonly known as:  GLUCOPHAGE  Take 1,000 mg by mouth 2 (two) times daily with a meal.     metroNIDAZOLE 500 MG tablet  Commonly known as:  FLAGYL  Take 1 tablet (500 mg total) by mouth 3 (three) times daily. For 10 days     PARoxetine 40 MG tablet  Commonly known as:  PAXIL  Take 40 mg by mouth every morning.     spironolactone 25 MG tablet  Commonly known as:  ALDACTONE  Take 25 mg by mouth 2 (two) times daily.       No Known Allergies     Follow-up Information   Follow up with  [facility]. Schedule an appointment as soon as possible for a visit in 2 weeks.   Contact information:   [address]   [place] [address] [phone]       Follow up with Geraldo Pitter, MD. Schedule an appointment as soon as possible for a visit in 2 weeks.   Specialty:  Family Medicine   Contact information:   [address] [place] [address] [phone]        The results of significant diagnostics from this hospitalization (including imaging, microbiology, ancillary and laboratory) are listed below for reference.    Significant Diagnostic Studies: Ct Abdomen Pelvis W Contrast  08/10/2013   CLINICAL DATA:  Mid abdominal pain for several weeks. Periumbilical pain.  EXAM: CT ABDOMEN AND PELVIS WITH CONTRAST  TECHNIQUE: Multidetector CT imaging of the abdomen and pelvis was performed using the standard protocol following bolus administration of intravenous contrast.  CONTRAST:  OMNIPAQUE IOHEXOL 300 MG/ML  SOLN  COMPARISON:  12/23/2008  FINDINGS: Lung bases are clear allowing for motion artifact. Streak artifact from the arms limits visualization of some areas. Low-attenuation lesions in the liver are with anterior segment right lobe of liver lesion measuring 9 mm and inferior right lobe of liver lesion 2.8 cm. These are likely cysts and were present previously without change.  The gallbladder is mildly distended and filled with sludge. The gallbladder wall is diffusely thickened and edematous with pericholecystic stranding. Changes are consistent with acute cholecystitis. No bile duct dilatation. The spleen, pancreas, adrenal glands, kidneys, abdominal aorta, inferior vena cava, and retroperitoneal lymph nodes are unremarkable. The stomach, small bowel, and colon are not abnormally distended. Contrast material flows to the distal colon without evidence of obstruction. No free air or free fluid in the abdomen.  Pelvis: Bladder wall is not  thickened. Uterus and ovaries are not enlarged. Appendix is normal. No evidence of diverticulitis. No free or loculated pelvic fluid collections. Small umbilical hernia containing fat. Degenerative changes in the lumbar spine. Spondylolysis with mild spondylolisthesis at L4-5. No destructive bone lesions appreciated.  IMPRESSION: Thick edematous gallbladder wall with gallbladder distention and sludge and pericholecystic inflammatory stranding. Changes are consistent with acute cholecystitis.   Electronically Signed   By: Burman Nieves M.D.   On: 08/10/2013 03:15   Mr 3d Recon At Scanner  08/11/2013   CLINICAL DATA:  Right upper quadrant pain radiating to back. Nausea and vomiting. Cholecystitis. Elevated liver function tests. Clinical suspicion for choledocholithiasis.  EXAM: MRI ABDOMEN WITHOUT AND WITH CONTRAST (INCLUDING MRCP)  TECHNIQUE: Multiplanar multisequence MR imaging of the abdomen was performed both before and after  the administration of intravenous contrast. Heavily T2-weighted images of the biliary and pancreatic ducts were obtained, and three-dimensional MRCP images were rendered by post processing.  CONTRAST:  20mL MULTIHANCE GADOBENATE DIMEGLUMINE 529 MG/ML IV SOLN  COMPARISON:  CT on 08/10/2013  FINDINGS: Numerous tiny gallstones are seen. Gallbladder is distended and demonstrates diffuse wall thickening and mild pericholecystic inflammatory changes, consistent with acute cholecystitis.  Mild biliary ductal dilatation is seen with common bile duct measuring 11 mm. At least 2 tiny less than 1 cm calculi are seen within the distal common bile duct on thin slab coronal and axial MRCP sequences.  No evidence of pancreatic mass or pancreatic ductal dilatation. No evidence of pancreas divisum. Several small hepatic cysts are noted, but no liver mass or abscess identified. The spleen, adrenal glands, and kidneys are normal in appearance. No soft tissue masses or lymphadenopathy identified.   IMPRESSION: Acute cholecystitis.  Mild biliary ductal dilatation, with at least 2 tiny less than 1 cm calculi in distal common bile duct.  Several benign hepatic cysts incidentally noted. No other significant abnormality identified.   Electronically Signed   By: Myles Rosenthal M.D.   On: 08/11/2013 09:23   Dg Ercp With Sphincterotomy  08/13/2013   CLINICAL DATA:  Cholecystitis and possible choledocholithiasis by MRCP.  EXAM: ERCP  TECHNIQUE: Multiple spot images obtained with the fluoroscopic device and submitted for interpretation post-procedure.  COMPARISON:  MRCP exam on 08/11/2013  FINDINGS: Images obtained with a C-arm during the ERCP procedure demonstrate cannulation of the common bile duct with contrast injection showing no visible filling defects. No biliary ductal dilatation is identified. No extravasation was seen. Per the operative note, a balloon sweep maneuver was performed.  IMPRESSION: Unremarkable ERCP images showing no filling defects or evidence of biliary ductal dilatation.  These images were submitted for radiologic interpretation only. Please see the procedural report for the amount of contrast and the fluoroscopy time utilized.   Electronically Signed   By: Irish Lack M.D.   On: 08/13/2013 08:40   Mr Abd W/wo Cm/mrcp  08/11/2013   CLINICAL DATA:  Right upper quadrant pain radiating to back. Nausea and vomiting. Cholecystitis. Elevated liver function tests. Clinical suspicion for choledocholithiasis.  EXAM: MRI ABDOMEN WITHOUT AND WITH CONTRAST (INCLUDING MRCP)  TECHNIQUE: Multiplanar multisequence MR imaging of the abdomen was performed both before and after the administration of intravenous contrast. Heavily T2-weighted images of the biliary and pancreatic ducts were obtained, and three-dimensional MRCP images were rendered by post processing.  CONTRAST:  20mL MULTIHANCE GADOBENATE DIMEGLUMINE 529 MG/ML IV SOLN  COMPARISON:  CT on 08/10/2013  FINDINGS: Numerous tiny gallstones are  seen. Gallbladder is distended and demonstrates diffuse wall thickening and mild pericholecystic inflammatory changes, consistent with acute cholecystitis.  Mild biliary ductal dilatation is seen with common bile duct measuring 11 mm. At least 2 tiny less than 1 cm calculi are seen within the distal common bile duct on thin slab coronal and axial MRCP sequences.  No evidence of pancreatic mass or pancreatic ductal dilatation. No evidence of pancreas divisum. Several small hepatic cysts are noted, but no liver mass or abscess identified. The spleen, adrenal glands, and kidneys are normal in appearance. No soft tissue masses or lymphadenopathy identified.  IMPRESSION: Acute cholecystitis.  Mild biliary ductal dilatation, with at least 2 tiny less than 1 cm calculi in distal common bile duct.  Several benign hepatic cysts incidentally noted. No other significant abnormality identified.   Electronically Signed   By: Jonny Ruiz  Eppie Gibson M.D.   On: 08/11/2013 09:23    Microbiology: Recent Results (from the past 240 hour(s))  URINE CULTURE     Status: None   Collection Time    08/10/13  3:43 AM      Result Value Range Status   Specimen Description URINE, RANDOM   Final   Special Requests NONE   Final   Culture  Setup Time     Final   Value: 08/10/2013 13:20     Performed at [facility] Count     Final   Value: NO GROWTH     Performed at [facility]   Culture     Final   Value: NO GROWTH     Performed at [facility]   Report Status 08/11/2013 FINAL   Final  SURGICAL PCR SCREEN     Status: None   Collection Time    08/12/13 10:55 AM      Result Value Range Status   MRSA, PCR NEGATIVE  NEGATIVE Final   Staphylococcus aureus NEGATIVE  NEGATIVE Final   Comment:            The Xpert SA Assay (FDA     approved for NASAL specimens     in patients over 56 years of age),     is one component of     a comprehensive surveillance     program.  Test performance has     been  validated by [facility] for patients greater     than or equal to 74 year old.     It is not intended     to diagnose infection nor to     guide or monitor treatment.     Labs: Basic Metabolic Panel:  Recent Labs Lab 08/09/13 2315 08/10/13 0702 08/11/13 0610 08/12/13 0520 08/13/13 0520 08/14/13 0541  NA 135  --  144 144 146* 143  K 3.6  --  3.2* 3.2* 3.2* 3.1*  CL 97  --  106 106 111 107  CO2 24  --  26 27 26 23   GLUCOSE 102*  --  130* 110* 123* 105*  BUN 18  --  8 5* 5* 6  CREATININE 0.88 0.81 0.78 0.79 0.81 0.85  CALCIUM 9.0  --  8.5 8.8 8.1* 8.1*  MG  --   --   --   --  1.6  --    Liver Function Tests:  Recent Labs Lab 08/09/13 2315 08/11/13 0610 08/12/13 0520 08/13/13 0520 08/14/13 0541  AST 86* 70* 54* 31 52*  ALT 141* 110* 103* 68* 69*  ALKPHOS 244* 232* 248* 183* 154*  BILITOT 3.2* 3.1* 1.4* 0.9 0.9  PROT 7.3 6.5 6.8 6.0 5.9*  ALBUMIN 3.6 2.9* 3.2* 2.7* 2.6*    Recent Labs Lab 08/09/13 2315  LIPASE 30   No results found for this basename: AMMONIA,  in the last 168 hours CBC:  Recent Labs Lab 08/07/13 2219 08/09/13 2315 08/10/13 0702 08/11/13 0610 08/12/13 0520 08/13/13 0500 08/14/13 0541  WBC 11.7* 11.5* 9.1 4.8 4.9 6.7 8.8  NEUTROABS 9.4* 9.5*  --   --   --   --   --   HGB 12.5 11.7* 11.1* 10.8* 11.3* 11.4* 10.5*  HCT 37.1 34.3* 32.6* 32.5* 33.7* 34.8* 32.6*  MCV 86.1 85.8 84.9 86.4 86.4 88.1 88.1  PLT 359 287 323 341 412* 388 381   Cardiac Enzymes: No results found  for this basename: CKTOTAL, CKMB, CKMBINDEX, TROPONINI,  in the last 168 hours BNP: BNP (last 3 results) No results found for this basename: PROBNP,  in the last 8760 hours CBG:  Recent Labs Lab 08/14/13 0055 08/14/13 0405 08/14/13 0741 08/14/13 1127 08/14/13 1622  GLUCAP 122* 108* 115* 120* 135*       Signed:  HERNANDEZ ACOSTA,ESTELA  [facility] Pager: [phone] 08/14/2013, 4:43 PM

## 2013-08-14 NOTE — Progress Notes (Signed)
Patient discharge teaching given, including activity, diet, follow-up appoints, and medications. Patient verbalized understanding of all discharge instructions. IV access was d/c'd. Vitals are stable. Skin is intact except as charted in most recent assessments. All Home health equipment provided by [facility]. Pt escorted out by RN, to be driven home by family.

## 2013-08-14 NOTE — Progress Notes (Signed)
[facility] PROGRESS NOTE  Tracy [name] ZOX:[id number] DOB: Jan 15, 1941 DOA: 08/09/2013 PCP: Geraldo Pitter, MD  Brief history  72 year old female with a history of diabetes mellitus, hypertension with prior CVA, presents to the ED with 2 week history of intermittent epigastric and right upper quadrant pain. The patient is a poor historian, but she has been complaining ofintermittent nausea, vomiting, and worsening pain after meals for the past 2-3 weeks. The patient denies any fevers, chills, hematemesis, diarrhea, hematochezia, melena, dysuria, hematuria. Evaluation in the ED revealed AST 36, ALT 141, alkaline phosphatase 244, total bilirubin to 3.2. CT of the abdomen and pelvis showed gallbladder distention, gallbladder wall thickening with pericholecystic stranding concerning for acute cholecystitis. General surgery was consulted. They recommended MRCP. MRCP revealed 2 tiny stones in the common bile duct. The patient has been on Zosyn with improvement of her WBC.  ERCP was performed by Dr. Elnoria Howard on 08/12/13 whom removed 2 stones from CBD.  Assessment/Plan: Acute cholecystitis  -DC IV Zosyn  -Had ERCP 12/2, lap chole 12/3. -Patient feels well an is anxious to DC home today. -If tolerates diet can consider DC home later today.  Choledocholithiasis -12//2/14--ERCP (Dr. Hung)-->removed 2 stones  Pyuria  -urine culture negative  -No further abx required.  Leukocytosis  -Secondary to  cholecystitis  -Resolved  Diabetes mellitus, type II  -Hemoglobin A1c--6.4 -NovoLog sliding scale  -Well controlled.  History of stroke  -Hold Plavix in anticipation for surgery   Hypertension  -Discontinue antihypertensive medications as the patient's blood pressure is soft--may need to restart antihypertensive medications if BP increases -pt only on spironolactone -Well-controlled.  Hypokalemia -Replete -Check magnesium  Family Communication: Patient only Disposition Plan: Likely DC  home today or in am.  Antibiotics:  mefoxin 08/10/13  Zosyn 08/10/13>>>08/14/2013         Procedures/Studies: Ct Abdomen Pelvis W Contrast  08/10/2013   CLINICAL DATA:  Mid abdominal pain for several weeks. Periumbilical pain.  EXAM: CT ABDOMEN AND PELVIS WITH CONTRAST  TECHNIQUE: Multidetector CT imaging of the abdomen and pelvis was performed using the standard protocol following bolus administration of intravenous contrast.  CONTRAST:  OMNIPAQUE IOHEXOL 300 MG/ML  SOLN  COMPARISON:  12/23/2008  FINDINGS: Lung bases are clear allowing for motion artifact. Streak artifact from the arms limits visualization of some areas. Low-attenuation lesions in the liver are with anterior segment right lobe of liver lesion measuring 9 mm and inferior right lobe of liver lesion 2.8 cm. These are likely cysts and were present previously without change.  The gallbladder is mildly distended and filled with sludge. The gallbladder wall is diffusely thickened and edematous with pericholecystic stranding. Changes are consistent with acute cholecystitis. No bile duct dilatation. The spleen, pancreas, adrenal glands, kidneys, abdominal aorta, inferior vena cava, and retroperitoneal lymph nodes are unremarkable. The stomach, small bowel, and colon are not abnormally distended. Contrast material flows to the distal colon without evidence of obstruction. No free air or free fluid in the abdomen.  Pelvis: Bladder wall is not thickened. Uterus and ovaries are not enlarged. Appendix is normal. No evidence of diverticulitis. No free or loculated pelvic fluid collections. Small umbilical hernia containing fat. Degenerative changes in the lumbar spine. Spondylolysis with mild spondylolisthesis at L4-5. No destructive bone lesions appreciated.  IMPRESSION: Thick edematous gallbladder wall with gallbladder distention and sludge and pericholecystic inflammatory stranding. Changes are consistent with acute cholecystitis.    Electronically Signed   By: Burman Nieves M.D.   On: 08/10/2013 03:15  Mr 3d Recon At Scanner  08/11/2013   CLINICAL DATA:  Right upper quadrant pain radiating to back. Nausea and vomiting. Cholecystitis. Elevated liver function tests. Clinical suspicion for choledocholithiasis.  EXAM: MRI ABDOMEN WITHOUT AND WITH CONTRAST (INCLUDING MRCP)  TECHNIQUE: Multiplanar multisequence MR imaging of the abdomen was performed both before and after the administration of intravenous contrast. Heavily T2-weighted images of the biliary and pancreatic ducts were obtained, and three-dimensional MRCP images were rendered by post processing.  CONTRAST:  20mL MULTIHANCE GADOBENATE DIMEGLUMINE 529 MG/ML IV SOLN  COMPARISON:  CT on 08/10/2013  FINDINGS: Numerous tiny gallstones are seen. Gallbladder is distended and demonstrates diffuse wall thickening and mild pericholecystic inflammatory changes, consistent with acute cholecystitis.  Mild biliary ductal dilatation is seen with common bile duct measuring 11 mm. At least 2 tiny less than 1 cm calculi are seen within the distal common bile duct on thin slab coronal and axial MRCP sequences.  No evidence of pancreatic mass or pancreatic ductal dilatation. No evidence of pancreas divisum. Several small hepatic cysts are noted, but no liver mass or abscess identified. The spleen, adrenal glands, and kidneys are normal in appearance. No soft tissue masses or lymphadenopathy identified.  IMPRESSION: Acute cholecystitis.  Mild biliary ductal dilatation, with at least 2 tiny less than 1 cm calculi in distal common bile duct.  Several benign hepatic cysts incidentally noted. No other significant abnormality identified.   Electronically Signed   By: Myles Rosenthal M.D.   On: 08/11/2013 09:23   Mr Abd W/wo Cm/mrcp  08/11/2013   CLINICAL DATA:  Right upper quadrant pain radiating to back. Nausea and vomiting. Cholecystitis. Elevated liver function tests. Clinical suspicion for  choledocholithiasis.  EXAM: MRI ABDOMEN WITHOUT AND WITH CONTRAST (INCLUDING MRCP)  TECHNIQUE: Multiplanar multisequence MR imaging of the abdomen was performed both before and after the administration of intravenous contrast. Heavily T2-weighted images of the biliary and pancreatic ducts were obtained, and three-dimensional MRCP images were rendered by post processing.  CONTRAST:  20mL MULTIHANCE GADOBENATE DIMEGLUMINE 529 MG/ML IV SOLN  COMPARISON:  CT on 08/10/2013  FINDINGS: Numerous tiny gallstones are seen. Gallbladder is distended and demonstrates diffuse wall thickening and mild pericholecystic inflammatory changes, consistent with acute cholecystitis.  Mild biliary ductal dilatation is seen with common bile duct measuring 11 mm. At least 2 tiny less than 1 cm calculi are seen within the distal common bile duct on thin slab coronal and axial MRCP sequences.  No evidence of pancreatic mass or pancreatic ductal dilatation. No evidence of pancreas divisum. Several small hepatic cysts are noted, but no liver mass or abscess identified. The spleen, adrenal glands, and kidneys are normal in appearance. No soft tissue masses or lymphadenopathy identified.  IMPRESSION: Acute cholecystitis.  Mild biliary ductal dilatation, with at least 2 tiny less than 1 cm calculi in distal common bile duct.  Several benign hepatic cysts incidentally noted. No other significant abnormality identified.   Electronically Signed   By: Myles Rosenthal M.D.   On: 08/11/2013 09:23         Subjective: Patient is groggy after ERCP. She arouses easily. Denies fevers, chills, shortness breath, vomiting, diarrhea.  Objective: Filed Vitals:   08/13/13 1730 08/13/13 1812 08/13/13 2010 08/14/13 0409  BP: 129/68 145/77 118/72 123/69  Pulse: 117 119 108 115  Temp: 98 F (36.7 C) 98 F (36.7 C) 98.6 F (37 C) 98.1 F (36.7 C)  TempSrc:  Oral Oral Oral  Resp: 18 19 20 20   Height:  5\' 6"  (1.676 m)   Weight:   98 kg (216 lb 0.8 oz)    SpO2: 97% 90% 93% 92%    Intake/Output Summary (Last 24 hours) at 08/14/13 0810 Last data filed at 08/14/13 0600  Gross per 24 hour  Intake 1860.06 ml  Output     25 ml  Net 1835.06 ml   Weight change: 0.5 kg (1 lb 1.6 oz) Exam:   General:  Pt is alert, follows commands appropriately, not in acute distress  HEENT: No icterus,  [place]/AT  Cardiovascular: RRR, S1/S2, no rubs, no gallops  Respiratory: CTA bilaterally, no wheezing, no crackles, no rhonchi  Abdomen: Soft/+BS, non tender, non distended, no guarding  Extremities: No edema, No lymphangitis, No petechiae, No rashes, no synovitis  Data Reviewed: Basic Metabolic Panel:  Recent Labs Lab 08/09/13 2315 08/10/13 0702 08/11/13 0610 08/12/13 0520 08/13/13 0520 08/14/13 0541  NA 135  --  144 144 146* 143  K 3.6  --  3.2* 3.2* 3.2* 3.1*  CL 97  --  106 106 111 107  CO2 24  --  26 27 26 23   GLUCOSE 102*  --  130* 110* 123* 105*  BUN 18  --  8 5* 5* 6  CREATININE 0.88 0.81 0.78 0.79 0.81 0.85  CALCIUM 9.0  --  8.5 8.8 8.1* 8.1*  MG  --   --   --   --  1.6  --    Liver Function Tests:  Recent Labs Lab 08/09/13 2315 08/11/13 0610 08/12/13 0520 08/13/13 0520 08/14/13 0541  AST 86* 70* 54* 31 52*  ALT 141* 110* 103* 68* 69*  ALKPHOS 244* 232* 248* 183* 154*  BILITOT 3.2* 3.1* 1.4* 0.9 0.9  PROT 7.3 6.5 6.8 6.0 5.9*  ALBUMIN 3.6 2.9* 3.2* 2.7* 2.6*    Recent Labs Lab 08/09/13 2315  LIPASE 30   No results found for this basename: AMMONIA,  in the last 168 hours CBC:  Recent Labs Lab 08/07/13 2219 08/09/13 2315 08/10/13 0702 08/11/13 0610 08/12/13 0520 08/13/13 0500 08/14/13 0541  WBC 11.7* 11.5* 9.1 4.8 4.9 6.7 8.8  NEUTROABS 9.4* 9.5*  --   --   --   --   --   HGB 12.5 11.7* 11.1* 10.8* 11.3* 11.4* 10.5*  HCT 37.1 34.3* 32.6* 32.5* 33.7* 34.8* 32.6*  MCV 86.1 85.8 84.9 86.4 86.4 88.1 88.1  PLT 359 287 323 341 412* 388 381   Cardiac Enzymes: No results found for this basename: CKTOTAL,  CKMB, CKMBINDEX, TROPONINI,  in the last 168 hours BNP: No components found with this basename: POCBNP,  CBG:  Recent Labs Lab 08/13/13 1811 08/13/13 2004 08/14/13 0055 08/14/13 0405 08/14/13 0741  GLUCAP 134* 90 122* 108* 115*    Recent Results (from the past 240 hour(s))  URINE CULTURE     Status: None   Collection Time    08/10/13  3:43 AM      Result Value Range Status   Specimen Description URINE, RANDOM   Final   Special Requests NONE   Final   Culture  Setup Time     Final   Value: 08/10/2013 13:20     Performed at [facility] Count     Final   Value: NO GROWTH     Performed at [facility]   Culture     Final   Value: NO GROWTH     Performed at [facility]   Report  Status 08/11/2013 FINAL   Final  SURGICAL PCR SCREEN     Status: None   Collection Time    08/12/13 10:55 AM      Result Value Range Status   MRSA, PCR NEGATIVE  NEGATIVE Final   Staphylococcus aureus NEGATIVE  NEGATIVE Final   Comment:            The Xpert SA Assay (FDA     approved for NASAL specimens     in patients over 69 years of age),     is one component of     a comprehensive surveillance     program.  Test performance has     been validated by [employer] for patients greater     than or equal to 10 year old.     It is not intended     to diagnose infection nor to     guide or monitor treatment.     Scheduled Meds: . buPROPion  150 mg Oral Daily  . insulin aspart  0-15 Units Subcutaneous Q4H  . PARoxetine  40 mg Oral Daily  . piperacillin-tazobactam (ZOSYN)  IV  3.375 g Intravenous Q8H   Continuous Infusions: . dextrose 5 % and 0.9% NaCl 100 mL/hr at 08/12/13 1857  . lactated ringers      time spent: 35 minutes. Greater than 50% of this time was spent in direct contact with patient coordinating her care.  Chaya Jan, MD  [facility] Pager [phone]  If 7PM-7AM, please contact  night-coverage [email or web address] Password [email or web address] 08/14/2013, 8:10 AM   LOS: 5 days

## 2013-08-14 NOTE — Evaluation (Signed)
Physical Therapy Evaluation Patient Details Name: Tracy [name] MRN: [id number] DOB: 12-17-1940 Today's Date: 08/14/2013 Time: 1510-1540 PT Time Calculation (min): 30 min  PT Assessment / Plan / Recommendation History of Present Illness  Pt is a 72 y.o. female s/p lap chole 12/3 with some confusion. PMH includes CVA with minimal Rt sided weakness, DM and HTN.   Clinical Impression  Pt adm secondary to above. Presents with functional limitations in mobility secondary to deficits listed below (see PT problem list). Pt to benefit from skilled PT to address deficits listed below and increase independence with mobility to decrease caregiver burden. Pt very unsteady and self limiting with mobility. Discussed with pt and family that PT recommends ST SNF but at this time, family and pt are refusing. Will recommend HHPT, wheelchair for family to negotiate steps into house, 3 in 1 and RW for DME needs. Pt is a fall risk  And has fallen multiple times at home per family.     PT Assessment  Patient needs continued PT services    Follow Up Recommendations  Home health PT;Supervision/Assistance - 24 hour (recommending SNF but pt refusing)     Does the patient have the potential to tolerate intense rehabilitation      Barriers to Discharge Decreased caregiver support pt lives with blind husband who can not provide physical (A) and sons who are not always there    Equipment Recommendations  Rolling walker with 5" wheels;3in1 (PT);Wheelchair (measurements PT)    Recommendations for Other Services OT consult   Frequency Min 2X/week    Precautions / Restrictions Precautions Precautions: Fall Precaution Comments: family reports pt has fallen multiple times at home  Restrictions Weight Bearing Restrictions: No   Pertinent Vitals/Pain C/o pain. Did not rate. patient repositioned for comfort       Mobility  Bed Mobility Bed Mobility: Supine to Sit;Sitting - Scoot to Edge of Bed Supine to Sit:  1: +2 Total assist;HOB elevated Supine to Sit: Patient Percentage: 20% Sitting - Scoot to Edge of Bed: 2: Max assist Details for Bed Mobility Assistance: sons were present and pulling pt up by her arms; educated family to support behind shoulders to prevent injury; pt very self limiting and required (A) of LEs and trunk to achieve sitting positon at EOB; pt c/o pain in back with movement  Transfers Transfers: Sit to Stand;Stand to Sit Sit to Stand: 1: +2 Total assist;From bed;With upper extremity assist Sit to Stand: Patient Percentage: 40% Stand to Sit: 1: +2 Total assist;To bed;With upper extremity assist Stand to Sit: Patient Percentage: 40% Details for Transfer Assistance: pt required increased (A) to acheive upright standing position; pt limited due to pain; requires max cues and encouragement to perform transfer; pt was able to power up but continued to keep trunk flexed; max cues for upright posture and to ext hips Ambulation/Gait Ambulation/Gait Assistance: 1: +2 Total assist Ambulation/Gait: Patient Percentage: 50% Ambulation Distance (Feet): 4 Feet (x2 forward and backward ) Assistive device: 2 person hand held assist Ambulation/Gait Assistance Details: pt with short shuffled steps; fwd flex trunk; required support under bil UEs and around waist to maintain balance; pt unsteady and unsafe with gt; is a fall risk; educated family on proper guarding techniques to decrease risk of falls and injury  Gait Pattern: Step-to pattern;Decreased stride length;Trunk flexed;Narrow base of support;Shuffle Gait velocity: very decreased General Gait Details: limited by pain and self limiting  Stairs: No Wheelchair Mobility Wheelchair Mobility: No  PT Diagnosis: Difficulty walking;Acute pain;Generalized weakness  PT Problem List: Decreased strength;Decreased balance;Decreased activity tolerance;Decreased mobility;Decreased cognition;Decreased knowledge of use of DME;Decreased safety  awareness;Pain;Obesity PT Treatment Interventions: DME instruction;Gait training;Functional mobility training;Therapeutic activities;Therapeutic exercise;Balance training;Neuromuscular re-education;Patient/family education     PT Goals(Current goals can be found in the care plan section) Acute Rehab PT Goals Patient Stated Goal: to go home PT Goal Formulation: With patient Time For Goal Achievement: 08/28/13 Potential to Achieve Goals: Fair  Visit Information  Last PT Received On: 08/14/13 Assistance Needed: +2 History of Present Illness: Pt is a 72 y.o. female s/p lap       Prior Functioning  Home Living Family/patient expects to be discharged to:: Private residence Living Arrangements: Spouse/significant other;Children Available Help at Discharge: Family;Available PRN/intermittently Type of Home: House Home Access: Stairs to enter [employer] of Steps: 4 Entrance Stairs-Rails: Right Home Layout: One level Home Equipment: Cane - single point Additional Comments: pt lives husband, who is blind, was independent prior to admission Prior Function Level of Independence: Independent with assistive device(s) Comments: amb with SPC; another son reported pt had to have help walking at church and around house at times   Communication Communication: Expressive difficulties;HOH Dominant Hand: Right    Cognition  Cognition Arousal/Alertness: Lethargic Behavior During Therapy: WFL for tasks assessed/performed Overall Cognitive Status: Impaired/Different from baseline Area of Impairment: Orientation;Following commands;Safety/judgement;Problem solving;Attention Orientation Level: Disoriented to;Situation;Place;Time Current Attention Level: Sustained Memory: Decreased short-term memory Following Commands: Follows one step commands with increased time;Follows one step commands inconsistently Safety/Judgement: Decreased awareness of deficits;Decreased awareness of  safety Problem Solving: Slow processing;Decreased initiation;Difficulty sequencing;Requires verbal cues;Requires tactile cues General Comments: pt continously stated she was at home. pt with previous CVA; family was present and seemed to feel as though cognition has been declined since CVA     Extremity/Trunk Assessment Upper Extremity Assessment Upper Extremity Assessment: Defer to OT evaluation Lower Extremity Assessment Lower Extremity Assessment: Generalized weakness Cervical / Trunk Assessment Cervical / Trunk Assessment: Kyphotic   Balance Balance Balance Assessed: Yes Static Sitting Balance Static Sitting - Balance Support: Bilateral upper extremity supported;Feet supported Static Sitting - Level of Assistance: 4: Min assist;5: Stand by assistance Static Sitting - Comment/# of Minutes: pt progressed to stand by (A) with max encouragement; tolerated sitting EOB ~5 min and continued to sit on EOB with family   End of Session PT - End of Session Equipment Utilized During Treatment: Gait belt Activity Tolerance: Patient limited by fatigue;Patient limited by pain Patient left: in bed;with call bell/phone within reach;with family/visitor present;Other (comment) (on EOB ) Nurse Communication: Mobility status;Precautions  GP     Donell Sievert, [place] [phone] 08/14/2013, 4:03 PM

## 2013-08-15 ENCOUNTER — Encounter (HOSPITAL_COMMUNITY): Payer: Self-pay | Admitting: Surgery

## 2013-08-18 ENCOUNTER — Telehealth (INDEPENDENT_AMBULATORY_CARE_PROVIDER_SITE_OTHER): Payer: Self-pay | Admitting: General Surgery

## 2013-08-18 ENCOUNTER — Inpatient Hospital Stay (HOSPITAL_COMMUNITY)
Admission: EM | Admit: 2013-08-18 | Discharge: 2013-08-24 | DRG: 354 | Disposition: A | Discharge status: Home / Self Care | Payer: Medicare Other | Attending: Surgery | Admitting: Surgery

## 2013-08-18 ENCOUNTER — Emergency Department (HOSPITAL_COMMUNITY): Payer: Medicare Other

## 2013-08-18 ENCOUNTER — Encounter (HOSPITAL_COMMUNITY): Payer: Self-pay | Admitting: Emergency Medicine

## 2013-08-18 DIAGNOSIS — K430 Incisional hernia with obstruction, without gangrene: Principal | ICD-10-CM | POA: Diagnosis present

## 2013-08-18 DIAGNOSIS — E1169 Type 2 diabetes mellitus with other specified complication: Secondary | ICD-10-CM | POA: Diagnosis present

## 2013-08-18 DIAGNOSIS — K56609 Unspecified intestinal obstruction, unspecified as to partial versus complete obstruction: Secondary | ICD-10-CM

## 2013-08-18 DIAGNOSIS — E876 Hypokalemia: Secondary | ICD-10-CM | POA: Diagnosis not present

## 2013-08-18 DIAGNOSIS — K929 Disease of digestive system, unspecified: Secondary | ICD-10-CM | POA: Diagnosis not present

## 2013-08-18 DIAGNOSIS — I10 Essential (primary) hypertension: Secondary | ICD-10-CM | POA: Diagnosis present

## 2013-08-18 DIAGNOSIS — J9819 Other pulmonary collapse: Secondary | ICD-10-CM | POA: Diagnosis not present

## 2013-08-18 DIAGNOSIS — Y832 Surgical operation with anastomosis, bypass or graft as the cause of abnormal reaction of the patient, or of later complication, without mention of misadventure at the time of the procedure: Secondary | ICD-10-CM | POA: Diagnosis not present

## 2013-08-18 DIAGNOSIS — Z8673 Personal history of transient ischemic attack (TIA), and cerebral infarction without residual deficits: Secondary | ICD-10-CM

## 2013-08-18 DIAGNOSIS — K560 Paralytic ileus: Secondary | ICD-10-CM | POA: Diagnosis not present

## 2013-08-18 DIAGNOSIS — Z7902 Long term (current) use of antithrombotics/antiplatelets: Secondary | ICD-10-CM

## 2013-08-18 DIAGNOSIS — Y9210 Unspecified residential institution as the place of occurrence of the external cause: Secondary | ICD-10-CM | POA: Diagnosis not present

## 2013-08-18 LAB — POCT I-STAT TROPONIN I: Troponin i, poc: 0 ng/mL (ref 0.00–0.08)

## 2013-08-18 LAB — CBC WITH DIFFERENTIAL/PLATELET
Basophils Absolute: 0 10*3/uL (ref 0.0–0.1)
Basophils Relative: 0 % (ref 0–1)
Eosinophils Absolute: 0.2 10*3/uL (ref 0.0–0.7)
HCT: 34.3 % — ABNORMAL LOW (ref 36.0–46.0)
Lymphs Abs: 1.2 10*3/uL (ref 0.7–4.0)
MCH: 29.3 pg (ref 26.0–34.0)
MCHC: 33.5 g/dL (ref 30.0–36.0)
MCV: 87.3 fL (ref 78.0–100.0)
Monocytes Absolute: 0.9 10*3/uL (ref 0.1–1.0)
Neutrophils Relative %: 67 % (ref 43–77)
Platelets: 518 10*3/uL — ABNORMAL HIGH (ref 150–400)
RBC: 3.93 MIL/uL (ref 3.87–5.11)
RDW: 16.6 % — ABNORMAL HIGH (ref 11.5–15.5)

## 2013-08-18 LAB — COMPREHENSIVE METABOLIC PANEL
ALT: 29 U/L (ref 0–35)
AST: 15 U/L (ref 0–37)
Albumin: 3.4 g/dL — ABNORMAL LOW (ref 3.5–5.2)
Alkaline Phosphatase: 131 U/L — ABNORMAL HIGH (ref 39–117)
CO2: 28 mEq/L (ref 19–32)
Potassium: 3.2 mEq/L — ABNORMAL LOW (ref 3.5–5.1)
Sodium: 140 mEq/L (ref 135–145)
Total Protein: 7.9 g/dL (ref 6.0–8.3)

## 2013-08-18 LAB — LIPASE, BLOOD: Lipase: 64 U/L — ABNORMAL HIGH (ref 11–59)

## 2013-08-18 MED ORDER — IOHEXOL 300 MG/ML  SOLN
100.0000 mL | Freq: Once | INTRAMUSCULAR | Status: AC | PRN
  Administered 2013-08-18: 100 mL via INTRAVENOUS

## 2013-08-18 MED ORDER — SODIUM CHLORIDE 0.9 % IV BOLUS (SEPSIS)
1000.0000 mL | INTRAVENOUS | Status: AC
  Administered 2013-08-18: 1000 mL via INTRAVENOUS

## 2013-08-18 MED ORDER — ONDANSETRON HCL 4 MG/2ML IJ SOLN
4.0000 mg | Freq: Once | INTRAMUSCULAR | Status: AC
  Administered 2013-08-18: 4 mg via INTRAVENOUS
  Filled 2013-08-18: qty 2

## 2013-08-18 MED ORDER — IOHEXOL 300 MG/ML  SOLN
25.0000 mL | INTRAMUSCULAR | Status: AC
  Administered 2013-08-18: 25 mL via ORAL

## 2013-08-18 NOTE — ED Notes (Signed)
Gwynn, phlebotomist, aware that pt needs blood draw in triage. Jonni Sanger, EMT attempted blood draw x1, unsuccessful

## 2013-08-18 NOTE — ED Notes (Signed)
MD at bedside. 

## 2013-08-18 NOTE — H&P (Signed)
Tracy [name] is an 72 y.o. female.   Chief Complaint: vomiting HPI: 53 yof who was admitted last week for cholecystitis and choledocholithiasis.  Takes plavix.  Had elevated bilirubin and underwent mrcp with stones.  She first underwent ercp that had a couple stones extracted.  Following this on 12/3 she underwent lap chole by Dr Jamey Ripa.  She eventually was discharged home on 5 d abx which she should complete today.  Her family states that she had vomiting on Friday and Saturday.  She hasnt been eating much.  Then she had recurrence of n/v yesterday which continued until today.  Her last emesis was about 3 o'clock today.  She last took plavix yesterday (she thinks in am).  She reports no abdominal pain.  She underwent plain films which shows possible bowel obstruction and then ct which shows what appears to be early hernia umbilicus.  I was asked to see her by Dr Romeo Apple who believes the hernia was reduced.    Past Medical History  Diagnosis Date  . Diabetes mellitus without complication   . Hypertension   . Stroke   . Acute cholecystitis 08/10/2013    Lap chole on 08/13/13     Past Surgical History  Procedure Laterality Date  . Ercp N/A 08/12/2013    Procedure: ENDOSCOPIC RETROGRADE CHOLANGIOPANCREATOGRAPHY (ERCP);  Surgeon: Theda Belfast, MD;  Location: [facility] ENDOSCOPY;  Service: Endoscopy;  Laterality: N/A;  . Sphincterotomy  08/12/2013    Procedure: SPHINCTEROTOMY;  Surgeon: Theda Belfast, MD;  Location: [facility] ENDOSCOPY;  Service: Endoscopy;;  . Cholecystectomy N/A 08/13/2013    Procedure: LAPAROSCOPIC CHOLECYSTECTOMY ;  Surgeon: Currie Paris, MD;  Location: [facility] OR;  Service: General;  Laterality: N/A;    No family history on file. Social History:  reports that she has never smoked. She does not have any smokeless tobacco history on file. She reports that she does not drink alcohol or use illicit drugs.  Allergies: No Known Allergies  meds reviewed  Results for orders placed  during the hospital encounter of 08/18/13 (from the past 48 hour(s))  CBC WITH DIFFERENTIAL     Status: Abnormal   Collection Time    08/18/13  6:31 PM      Result Value Range   WBC 7.0  4.0 - 10.5 K/uL   RBC 3.93  3.87 - 5.11 MIL/uL   Hemoglobin 11.5 (*) 12.0 - 15.0 g/dL   HCT 16.1 (*) 09.6 - 04.5 %   MCV 87.3  78.0 - 100.0 fL   MCH 29.3  26.0 - 34.0 pg   MCHC 33.5  30.0 - 36.0 g/dL   RDW 40.9 (*) 81.1 - 91.4 %   Platelets 518 (*) 150 - 400 K/uL   Neutrophils Relative % 67  43 - 77 %   Neutro Abs 4.7  1.7 - 7.7 K/uL   Lymphocytes Relative 17  12 - 46 %   Lymphs Abs 1.2  0.7 - 4.0 K/uL   Monocytes Relative 12  3 - 12 %   Monocytes Absolute 0.9  0.1 - 1.0 K/uL   Eosinophils Relative 3  0 - 5 %   Eosinophils Absolute 0.2  0.0 - 0.7 K/uL   Basophils Relative 0  0 - 1 %   Basophils Absolute 0.0  0.0 - 0.1 K/uL  COMPREHENSIVE METABOLIC PANEL     Status: Abnormal   Collection Time    08/18/13  6:31 PM      Result Value Range  Sodium 140  135 - 145 mEq/L   Potassium 3.2 (*) 3.5 - 5.1 mEq/L   Chloride 97  96 - 112 mEq/L   CO2 28  19 - 32 mEq/L   Glucose, Bld 96  70 - 99 mg/dL   BUN 9  6 - 23 mg/dL   Creatinine, Ser 2.13  0.50 - 1.10 mg/dL   Calcium 9.0  8.4 - 08.6 mg/dL   Total Protein 7.9  6.0 - 8.3 g/dL   Albumin 3.4 (*) 3.5 - 5.2 g/dL   AST 15  0 - 37 U/L   ALT 29  0 - 35 U/L   Alkaline Phosphatase 131 (*) 39 - 117 U/L   Total Bilirubin 0.6  0.3 - 1.2 mg/dL   GFR calc non Af Amer 85 (*) >90 mL/min   GFR calc Af Amer >90  >90 mL/min   Comment: (NOTE)     The eGFR has been calculated using the CKD EPI equation.     This calculation has not been validated in all clinical situations.     eGFR's persistently <90 mL/min signify possible Chronic Kidney     Disease.  LIPASE, BLOOD     Status: Abnormal   Collection Time    08/18/13  6:31 PM      Result Value Range   Lipase 64 (*) 11 - 59 U/L  POCT I-STAT TROPONIN I     Status: None   Collection Time    08/18/13  6:45 PM       Result Value Range   Troponin i, poc 0.00  0.00 - 0.08 ng/mL   Comment 3            Comment: Due to the release kinetics of cTnI,     a negative result within the first hours     of the onset of symptoms does not rule out     myocardial infarction with certainty.     If myocardial infarction is still suspected,     repeat the test at appropriate intervals.   Ct Abdomen Pelvis W Contrast  08/18/2013   CLINICAL DATA:  Status post recent laparoscopic cholecystectomy; upper abdominal pain and vomiting of black material.  EXAM: CT ABDOMEN AND PELVIS WITH CONTRAST  TECHNIQUE: Multidetector CT imaging of the abdomen and pelvis was performed using the standard protocol following bolus administration of intravenous contrast.  CONTRAST:  OMNIPAQUE IOHEXOL 300 MG/ML  SOLN  COMPARISON:  CT of the abdomen and pelvis performed 08/10/2013, and abdominal radiograph performed earlier today at 6:52 p.m.  FINDINGS: Minimal right basilar atelectasis or scarring is noted.  Fluid is seen tracking at the gallbladder fossa, with minimal scattered air. This likely still remains within normal limits given recent cholecystectomy. No additional fluid is seen tracking inferiorly to suggest a bile leak. There is also partial decompression of the adjacent hepatic cyst, now measuring 2.4 cm in size, likely reflecting partial emptying of the cyst into the gallbladder fossa. Mild residual soft tissue inflammation is noted at the right upper quadrant, extending to the adjacent hepatic flexure of the colon.  Additional scattered hypodensities within the liver are stable and likely reflect cysts. The spleen is unremarkable in appearance. The pancreas and adrenal glands are within normal limits.  The kidneys are unremarkable in appearance. There is no evidence of hydronephrosis. No renal or ureteral stones are seen. No perinephric stranding is appreciated.  There is herniation of a short segment of proximal to mid ileum into  a small  periumbilical hernia just inferior to the umbilicus, new from the prior study. This likely reflects recent instrumentation at the umbilicus. Mild associated soft tissue inflammation is seen. This causes bowel obstruction, with mild diffuse dilatation of more proximal small bowel loops. Manual reduction would likely be difficult, though it may be possible. More distal small bowel loops are entirely decompressed.  The stomach is within normal limits. No acute vascular abnormalities are seen.  The appendix is normal in caliber, without evidence for appendicitis. As described above, there is mild soft tissue inflammation at the hepatic flexure of the colon, reflecting the adjacent gallbladder fossa process. Two small diverticula are noted at the hepatic flexure of the colon. The colon is largely decompressed. Residual contrast is seen within the rectum.  The bladder is mildly distended and grossly unremarkable in appearance. The uterus is within normal limits. The ovaries are relatively symmetric; no suspicious adnexal masses are seen. Trace free fluid within the pelvis is likely physiologic in nature. No inguinal lymphadenopathy is seen.  No acute osseous abnormalities are identified. There is grade 1 anterolisthesis of L4 on L5, and vacuum phenomenon is noted at L4-L5 and L5-S1. Underlying facet disease is noted.  IMPRESSION: 1. Small bowel obstruction noted, due to herniation of a short segment of proximal to mid ileum into a small periumbilical hernia just inferior to the umbilicus, new from the prior study. This likely reflects recent instrumentation at the umbilicus; mild associated soft tissue inflammation seen. Manual reduction would likely be difficult, though it may be possible. 2. Fluid noted tracking about the gallbladder fossa, with minimal scattered air. This likely remains within normal limits given recent cholecystectomy. No definite evidence for bile leak. Partial decompression of the adjacent hepatic  cyst likely reflects partial emptying of the cyst into the gallbladder fossa. Mild residual soft tissue inflammation at the right upper quadrant extends to the adjacent hepatic flexure of the colon. 3. Minimal diverticulosis at the hepatic flexure of the colon, without evidence of diverticulitis. 4. Small hepatic cysts seen. 5. Mild degenerative change at the lower lumbar spine.  These results were called by telephone at the time of interpretation on 08/18/2013 at 9:35 PM to Dr. Purvis Sheffield, who verbally acknowledged these results.   Electronically Signed   By: Roanna Raider M.D.   On: 08/18/2013 21:37   Dg Abd 2 Views  08/18/2013   CLINICAL DATA:  Status post lap chole, no bowel movement for 1 week  EXAM: ABDOMEN - 2 VIEW  COMPARISON:  None  FINDINGS: Right upper quadrant cholecystectomy clips. In the mid abdomen there are few dilated loops of small bowel with air-fluid levels, measuring up to about 4.5 cm. Although oral contrast is seen in the distal colon there is minimal gas other than in the mid abdominal small bowel loops.  IMPRESSION: Findings suggest small bowel obstruction   Electronically Signed   By: Esperanza Heir M.D.   On: 08/18/2013 19:12    Review of Systems  Constitutional: Negative for fever and chills.  Respiratory: Positive for cough. Negative for sputum production and shortness of breath.   Gastrointestinal: Positive for nausea and vomiting. Negative for abdominal pain.    Blood pressure 121/70, pulse 91, temperature 97.9 F (36.6 C), temperature source Oral, resp. rate 20, SpO2 99.00%. Physical Exam  Vitals reviewed. Constitutional: She appears well-developed and well-nourished.  Eyes: No scleral icterus.  Neck: Neck supple.  Cardiovascular: Normal rate, regular rhythm and normal heart sounds.   Respiratory: Effort  normal and breath sounds normal. She has no wheezes. She has no rales.  GI: Soft. Bowel sounds are normal. She exhibits no distension. There is no  tenderness. Hernia: I cant really identify the hernia on her exam right now mostly due to her habitus, she is not tender at all at this site.  Lymphadenopathy:    She has no cervical adenopathy.     Assessment/Plan psbo secondary to incisional hernia  I discussed fact that she will need surgery to repair this.  We discussed factors leading up to it.  This may be reduced as I cant feel it right now but she certainly is not tender at all.  I think that letting plavix wear off at least 48 hours would be reasonable way to proceed.  If she has recurrent symptoms then will just fix it tonight.  I don't think she needs ng right now either.  Will admit, make npo, start on sliding scale insulin and discuss with Dr Gerrit Friends in the morning for repair. I discussed this plan with the patient and her family and they voiced understanding.  Her daughter [name] is the point of contact.  Zack Crager 08/18/2013, 10:37 PM

## 2013-08-18 NOTE — ED Notes (Signed)
Pt. Returned from CT.

## 2013-08-18 NOTE — ED Notes (Signed)
Pt had lap chole on Wednesday and now is having upper abdominal pain and is vomiting black stuff

## 2013-08-18 NOTE — ED Notes (Signed)
Patient transported to X-ray 

## 2013-08-18 NOTE — ED Notes (Signed)
Pt in CT.

## 2013-08-18 NOTE — ED Provider Notes (Signed)
CSN: [id number]     Arrival date & time 08/18/13  1559 History   First MD Initiated Contact with Patient 08/18/13 1726     Chief Complaint  Patient presents with  . Abdominal Pain  . Emesis   (Consider location/radiation/quality/duration/timing/severity/associated sxs/prior Treatment) Patient is a 72 y.o. female presenting with vomiting. The history is provided by the patient.  Emesis Severity:  Mild Duration:  3 days Timing:  Intermittent Quality:  Stomach contents (dark appearing) Able to tolerate:  Liquids and solids Progression:  Unchanged Chronicity:  New Relieved by:  Nothing Worsened by:  Nothing tried Ineffective treatments:  None tried Associated symptoms: no abdominal pain, no diarrhea, no fever and no headaches     Past Medical History  Diagnosis Date  . Diabetes mellitus without complication   . Hypertension   . Stroke   . Acute cholecystitis 08/10/2013    Lap chole on 08/13/13    Past Surgical History  Procedure Laterality Date  . Ercp N/A 08/12/2013    Procedure: ENDOSCOPIC RETROGRADE CHOLANGIOPANCREATOGRAPHY (ERCP);  Surgeon: Theda Belfast, MD;  Location: [facility] ENDOSCOPY;  Service: Endoscopy;  Laterality: N/A;  . Sphincterotomy  08/12/2013    Procedure: SPHINCTEROTOMY;  Surgeon: Theda Belfast, MD;  Location: [facility] ENDOSCOPY;  Service: Endoscopy;;  . Cholecystectomy N/A 08/13/2013    Procedure: LAPAROSCOPIC CHOLECYSTECTOMY ;  Surgeon: Currie Paris, MD;  Location: [facility] OR;  Service: General;  Laterality: N/A;   No family history on file. History  Substance Use Topics  . Smoking status: Never Smoker   . Smokeless tobacco: Not on file  . Alcohol Use: No   OB History   Grav Para Term Preterm Abortions TAB SAB Ect Mult Living                 Review of Systems  Constitutional: Negative for fever and fatigue.  HENT: Negative for congestion and drooling.   Eyes: Negative for pain.  Respiratory: Negative for cough and shortness of breath.   Cardiovascular:  Negative for chest pain.  Gastrointestinal: Positive for nausea and vomiting. Negative for abdominal pain and diarrhea.  Genitourinary: Negative for dysuria and hematuria.  Musculoskeletal: Negative for back pain, gait problem and neck pain.  Skin: Negative for color change.  Neurological: Negative for dizziness and headaches.  Hematological: Negative for adenopathy.  Psychiatric/Behavioral: Negative for behavioral problems.  All other systems reviewed and are negative.    Allergies  Review of patient's allergies indicates no known allergies.  Home Medications   Current Outpatient Rx  Name  Route  Sig  Dispense  Refill  . acetaminophen (TYLENOL) 325 MG tablet   Oral   Take 650 mg by mouth every 6 (six) hours as needed for pain.         Marland Kitchen buPROPion (WELLBUTRIN XL) 150 MG 24 hr tablet   Oral   Take 150 mg by mouth daily.         . ciprofloxacin (CIPRO) 250 MG tablet   Oral   Take 1 tablet (250 mg total) by mouth 2 (two) times daily. For 5 days   10 tablet   0   . clopidogrel (PLAVIX) 75 MG tablet   Oral   Take 75 mg by mouth every evening.         . loratadine (CLARITIN) 10 MG tablet   Oral   Take 10 mg by mouth daily.         . metFORMIN (GLUCOPHAGE) 500 MG tablet   Oral  Take 1,000 mg by mouth 2 (two) times daily with a meal.          . metroNIDAZOLE (FLAGYL) 500 MG tablet   Oral   Take 1 tablet (500 mg total) by mouth 3 (three) times daily. For 10 days   30 tablet   0   . PARoxetine (PAXIL) 40 MG tablet   Oral   Take 40 mg by mouth every morning.         Marland Kitchen spironolactone (ALDACTONE) 25 MG tablet   Oral   Take 25 mg by mouth 2 (two) times daily.          BP 113/70  Pulse 105  Temp(Src) 97.9 F (36.6 C) (Oral)  Resp 20  SpO2 95% Physical Exam  Nursing note and vitals reviewed. Constitutional: She is oriented to person, place, and time. She appears well-developed and well-nourished.  HENT:  Head: Normocephalic.  Mouth/Throat: No  oropharyngeal exudate.  Dry oral mucous membranes  Eyes: Conjunctivae and EOM are normal. Pupils are equal, round, and reactive to light.  Neck: Normal range of motion. Neck supple.  Cardiovascular: Regular rhythm, normal heart sounds and intact distal pulses.  Exam reveals no gallop and no friction rub.   No murmur heard. HR 105  Pulmonary/Chest: Effort normal and breath sounds normal. No respiratory distress. She has no wheezes.  Abdominal: Soft. Bowel sounds are normal. There is no tenderness. There is no rebound and no guarding.  Musculoskeletal: Normal range of motion. She exhibits no edema and no tenderness.  Neurological: She is alert and oriented to person, place, and time.  Skin: Skin is warm and dry.  Psychiatric: She has a normal mood and affect. Her behavior is normal.    ED Course  Procedures (including critical care time) Labs Review Labs Reviewed  CBC WITH DIFFERENTIAL - Abnormal; Notable for the following:    Hemoglobin 11.5 (*)    HCT 34.3 (*)    RDW 16.6 (*)    Platelets 518 (*)    All other components within normal limits  COMPREHENSIVE METABOLIC PANEL - Abnormal; Notable for the following:    Potassium 3.2 (*)    Albumin 3.4 (*)    Alkaline Phosphatase 131 (*)    GFR calc non Af Amer 85 (*)    All other components within normal limits  LIPASE, BLOOD - Abnormal; Notable for the following:    Lipase 64 (*)    All other components within normal limits  URINALYSIS W MICROSCOPIC + REFLEX CULTURE - Abnormal; Notable for the following:    Color, Urine AMBER (*)    Hgb urine dipstick SMALL (*)    Bilirubin Urine SMALL (*)    Ketones, ur 40 (*)    Leukocytes, UA SMALL (*)    Squamous Epithelial / LPF MANY (*)    Casts HYALINE CASTS (*)    All other components within normal limits  CBC - Abnormal; Notable for the following:    RBC 3.44 (*)    Hemoglobin 9.9 (*)    HCT 30.0 (*)    RDW 16.7 (*)    Platelets 447 (*)    All other components within normal limits   CREATININE, SERUM - Abnormal; Notable for the following:    GFR calc non Af Amer 84 (*)    All other components within normal limits  GLUCOSE, CAPILLARY  GLUCOSE, CAPILLARY  POCT I-STAT TROPONIN I   Imaging Review Ct Abdomen Pelvis W Contrast  08/18/2013   CLINICAL  DATA:  Status post recent laparoscopic cholecystectomy; upper abdominal pain and vomiting of black material.  EXAM: CT ABDOMEN AND PELVIS WITH CONTRAST  TECHNIQUE: Multidetector CT imaging of the abdomen and pelvis was performed using the standard protocol following bolus administration of intravenous contrast.  CONTRAST:  OMNIPAQUE IOHEXOL 300 MG/ML  SOLN  COMPARISON:  CT of the abdomen and pelvis performed 08/10/2013, and abdominal radiograph performed earlier today at 6:52 p.m.  FINDINGS: Minimal right basilar atelectasis or scarring is noted.  Fluid is seen tracking at the gallbladder fossa, with minimal scattered air. This likely still remains within normal limits given recent cholecystectomy. No additional fluid is seen tracking inferiorly to suggest a bile leak. There is also partial decompression of the adjacent hepatic cyst, now measuring 2.4 cm in size, likely reflecting partial emptying of the cyst into the gallbladder fossa. Mild residual soft tissue inflammation is noted at the right upper quadrant, extending to the adjacent hepatic flexure of the colon.  Additional scattered hypodensities within the liver are stable and likely reflect cysts. The spleen is unremarkable in appearance. The pancreas and adrenal glands are within normal limits.  The kidneys are unremarkable in appearance. There is no evidence of hydronephrosis. No renal or ureteral stones are seen. No perinephric stranding is appreciated.  There is herniation of a short segment of proximal to mid ileum into a small periumbilical hernia just inferior to the umbilicus, new from the prior study. This likely reflects recent instrumentation at the umbilicus. Mild  associated soft tissue inflammation is seen. This causes bowel obstruction, with mild diffuse dilatation of more proximal small bowel loops. Manual reduction would likely be difficult, though it may be possible. More distal small bowel loops are entirely decompressed.  The stomach is within normal limits. No acute vascular abnormalities are seen.  The appendix is normal in caliber, without evidence for appendicitis. As described above, there is mild soft tissue inflammation at the hepatic flexure of the colon, reflecting the adjacent gallbladder fossa process. Two small diverticula are noted at the hepatic flexure of the colon. The colon is largely decompressed. Residual contrast is seen within the rectum.  The bladder is mildly distended and grossly unremarkable in appearance. The uterus is within normal limits. The ovaries are relatively symmetric; no suspicious adnexal masses are seen. Trace free fluid within the pelvis is likely physiologic in nature. No inguinal lymphadenopathy is seen.  No acute osseous abnormalities are identified. There is grade 1 anterolisthesis of L4 on L5, and vacuum phenomenon is noted at L4-L5 and L5-S1. Underlying facet disease is noted.  IMPRESSION: 1. Small bowel obstruction noted, due to herniation of a short segment of proximal to mid ileum into a small periumbilical hernia just inferior to the umbilicus, new from the prior study. This likely reflects recent instrumentation at the umbilicus; mild associated soft tissue inflammation seen. Manual reduction would likely be difficult, though it may be possible. 2. Fluid noted tracking about the gallbladder fossa, with minimal scattered air. This likely remains within normal limits given recent cholecystectomy. No definite evidence for bile leak. Partial decompression of the adjacent hepatic cyst likely reflects partial emptying of the cyst into the gallbladder fossa. Mild residual soft tissue inflammation at the right upper quadrant  extends to the adjacent hepatic flexure of the colon. 3. Minimal diverticulosis at the hepatic flexure of the colon, without evidence of diverticulitis. 4. Small hepatic cysts seen. 5. Mild degenerative change at the lower lumbar spine.  These results were called by  telephone at the time of interpretation on 08/18/2013 at 9:35 PM to Dr. Purvis Sheffield, who verbally acknowledged these results.   Electronically Signed   By: Roanna Raider M.D.   On: 08/18/2013 21:37   Dg Abd 2 Views  08/18/2013   CLINICAL DATA:  Status post lap chole, no bowel movement for 1 week  EXAM: ABDOMEN - 2 VIEW  COMPARISON:  None  FINDINGS: Right upper quadrant cholecystectomy clips. In the mid abdomen there are few dilated loops of small bowel with air-fluid levels, measuring up to about 4.5 cm. Although oral contrast is seen in the distal colon there is minimal gas other than in the mid abdominal small bowel loops.  IMPRESSION: Findings suggest small bowel obstruction   Electronically Signed   By: Esperanza Heir M.D.   On: 08/18/2013 19:12    EKG Interpretation   None       MDM   1. Small bowel obstruction    5:55 PM 72 y.o. female status post recent ERCP and cholecystectomy he was discharged home 3 days ago and presents with increased oral intake, dark-appearing emesis. She denies any fevers and has not had a bowel movement in approximately 3 days. She is mildly tachycardic here. She denies having any abdominal pain at home and her abdomen is benign on exam. Will get IV fluids, screening labwork, screening abdominal plain film.  Abd plain film cw sbo. Will get CT.   CT showing periumbilical hernia. Attempted manual reduction. Consulted [facility] who will admit.     Junius Argyle, MD 08/19/13 1257

## 2013-08-18 NOTE — ED Notes (Signed)
Surgeon at bedside.  

## 2013-08-18 NOTE — Telephone Encounter (Signed)
Patient called status post lap chole on 08/13/2013 complaining of ongoing vomiting. She states she has been vomiting daily since she has been home. She has not had a bowel movement since prior to surgery - she states the day after Thanksgiving. She is nauseated all day. She is not currently passing gas. I advised patient she should be seen back at the hospital for evaluation. She will be evaluated through the ER and they will contact our surgeons if needed.

## 2013-08-19 LAB — CREATININE, SERUM: GFR calc Af Amer: >90 mL/min (ref >90)

## 2013-08-19 LAB — GLUCOSE, CAPILLARY
Glucose-Capillary: 78 mg/dL (ref 70–99)
Glucose-Capillary: 93 mg/dL (ref 70–99)
Glucose-Capillary: 102 mg/dL — ABNORMAL HIGH (ref 70–99)
Glucose-Capillary: 115 mg/dL — ABNORMAL HIGH (ref 70–99)

## 2013-08-19 LAB — URINALYSIS W MICROSCOPIC + REFLEX CULTURE
Glucose, UA: NEGATIVE mg/dL
Ketones, ur: 40 mg/dL — AB
Nitrite: NEGATIVE
Specific Gravity, Urine: 1.015 (ref 1.005–1.030)
pH: 6 (ref 5.0–8.0)

## 2013-08-19 LAB — CBC
Hemoglobin: 9.9 g/dL — ABNORMAL LOW (ref 12.0–15.0)
MCH: 28.8 pg (ref 26.0–34.0)
MCV: 87.2 fL (ref 78.0–100.0)
RBC: 3.44 MIL/uL — ABNORMAL LOW (ref 3.87–5.11)

## 2013-08-19 MED ORDER — HEPARIN SODIUM (PORCINE) 5000 UNIT/ML IJ SOLN
5000.0000 [IU] | Freq: Three times a day (TID) | INTRAMUSCULAR | Status: DC
  Administered 2013-08-19: 5000 [IU] via SUBCUTANEOUS
  Filled 2013-08-19 (×4): qty 1

## 2013-08-19 MED ORDER — DEXTROSE-NACL 5-0.9 % IV SOLN
INTRAVENOUS | Status: DC
  Administered 2013-08-19 – 2013-08-20 (×3): via INTRAVENOUS

## 2013-08-19 MED ORDER — HEPARIN SODIUM (PORCINE) 5000 UNIT/ML IJ SOLN
5000.0000 [IU] | Freq: Three times a day (TID) | INTRAMUSCULAR | Status: AC
  Administered 2013-08-19 (×2): 5000 [IU] via SUBCUTANEOUS

## 2013-08-19 MED ORDER — SODIUM CHLORIDE 0.9 % IV SOLN
INTRAVENOUS | Status: DC
  Administered 2013-08-19: 01:00:00 via INTRAVENOUS

## 2013-08-19 MED ORDER — ACETAMINOPHEN 325 MG PO TABS
650.0000 mg | ORAL_TABLET | Freq: Four times a day (QID) | ORAL | Status: DC | PRN
  Administered 2013-08-19 (×2): 650 mg via ORAL
  Filled 2013-08-19 (×2): qty 2

## 2013-08-19 MED ORDER — PANTOPRAZOLE SODIUM 40 MG IV SOLR
40.0000 mg | Freq: Every day | INTRAVENOUS | Status: DC
  Administered 2013-08-19 – 2013-08-23 (×6): 40 mg via INTRAVENOUS
  Filled 2013-08-19 (×9): qty 40

## 2013-08-19 MED ORDER — ACETAMINOPHEN 650 MG RE SUPP
650.0000 mg | Freq: Four times a day (QID) | RECTAL | Status: DC | PRN
  Administered 2013-08-20: 650 mg via RECTAL

## 2013-08-19 MED ORDER — INSULIN ASPART 100 UNIT/ML ~~LOC~~ SOLN
0.0000 [IU] | Freq: Three times a day (TID) | SUBCUTANEOUS | Status: DC
  Administered 2013-08-20: 2 [IU] via SUBCUTANEOUS

## 2013-08-19 MED ORDER — MORPHINE SULFATE 2 MG/ML IJ SOLN
2.0000 mg | INTRAMUSCULAR | Status: DC | PRN
  Administered 2013-08-19 (×2): 2 mg via INTRAVENOUS
  Filled 2013-08-19 (×2): qty 1

## 2013-08-19 MED ORDER — ALUM & MAG HYDROXIDE-SIMETH 200-200-20 MG/5ML PO SUSP
30.0000 mL | Freq: Four times a day (QID) | ORAL | Status: DC | PRN
  Administered 2013-08-19: 30 mL via ORAL
  Filled 2013-08-19: qty 30

## 2013-08-19 MED ORDER — PANTOPRAZOLE SODIUM 40 MG IV SOLR
40.0000 mg | Freq: Once | INTRAVENOUS | Status: AC
  Administered 2013-08-19: 40 mg via INTRAVENOUS
  Filled 2013-08-19: qty 40

## 2013-08-19 MED ORDER — ONDANSETRON HCL 4 MG/2ML IJ SOLN
4.0000 mg | Freq: Four times a day (QID) | INTRAMUSCULAR | Status: DC | PRN
  Administered 2013-08-19 – 2013-08-20 (×2): 4 mg via INTRAVENOUS
  Filled 2013-08-19 (×3): qty 2

## 2013-08-19 NOTE — Progress Notes (Signed)
General Surgery [facility]  Patient seen and examined.  Family members in room and on cell phone by speaker.  Plan to explore wound in AM in OR under general anesthesia.  Discussed larger incision, possible use of mesh, possible need for bowel resection.  They understand and agree to proceed.  Velora Heckler, MD, [facility] Office: [phone]

## 2013-08-19 NOTE — Progress Notes (Signed)
Subjective: Pt feels okay c/o indigestion/heart burn in chest.  Minimal nausea.  No Vomiting.  Abdominal pain only when palpated over umbilicus.  Ambulating OOB.    Objective: Vital signs in last 24 hours: Temp:  [97.9 F (36.6 C)-98.6 F (37 C)] 98.6 F (37 C) (12/09 0510) Pulse Rate:  [91-105] 98 (12/09 0510) Resp:  [18-20] 18 (12/09 0510) BP: (105-136)/(57-77) 113/57 mmHg (12/09 0510) SpO2:  [92 %-99 %] 92 % (12/09 0510) Weight:  [211 lb 6.7 oz (95.9 kg)] 211 lb 6.7 oz (95.9 kg) (12/09 0029)    Intake/Output from previous day: 12/08 0701 - 12/09 0700 In: -  Out: 350 [Urine:350] Intake/Output this shift:    PE: Gen:  Alert, NAD, pleasant Abd: Soft, ND, tender to palpation over umbilicus, hernia palpated, dish watery drainage from umbilicus when palpating hernia, +BS, no HSM, laparoscopic scars noted and appear well healed  Lab Results:   Recent Labs  08/18/13 1831 08/19/13 0535  WBC 7.0 6.5  HGB 11.5* 9.9*  HCT 34.3* 30.0*  PLT 518* 447*   BMET  Recent Labs  08/18/13 1831 08/19/13 0535  NA 140  --   K 3.2*  --   CL 97  --   CO2 28  --   GLUCOSE 96  --   BUN 9  --   CREATININE 0.70 0.71  CALCIUM 9.0  --    PT/INR No results found for this basename: LABPROT, INR,  in the last 72 hours CMP     Component Value Date/Time   NA 140 08/18/2013 1831   K 3.2* 08/18/2013 1831   CL 97 08/18/2013 1831   CO2 28 08/18/2013 1831   GLUCOSE 96 08/18/2013 1831   BUN 9 08/18/2013 1831   CREATININE 0.71 08/19/2013 0535   CALCIUM 9.0 08/18/2013 1831   PROT 7.9 08/18/2013 1831   ALBUMIN 3.4* 08/18/2013 1831   AST 15 08/18/2013 1831   ALT 29 08/18/2013 1831   ALKPHOS 131* 08/18/2013 1831   BILITOT 0.6 08/18/2013 1831   GFRNONAA 84* 08/19/2013 0535   GFRAA >90 08/19/2013 0535   Lipase     Component Value Date/Time   LIPASE 64* 08/18/2013 1831       Studies/Results: Ct Abdomen Pelvis W Contrast  08/18/2013   CLINICAL DATA:  Status post recent laparoscopic  cholecystectomy; upper abdominal pain and vomiting of black material.  EXAM: CT ABDOMEN AND PELVIS WITH CONTRAST  TECHNIQUE: Multidetector CT imaging of the abdomen and pelvis was performed using the standard protocol following bolus administration of intravenous contrast.  CONTRAST:  OMNIPAQUE IOHEXOL 300 MG/ML  SOLN  COMPARISON:  CT of the abdomen and pelvis performed 08/10/2013, and abdominal radiograph performed earlier today at 6:52 p.m.  FINDINGS: Minimal right basilar atelectasis or scarring is noted.  Fluid is seen tracking at the gallbladder fossa, with minimal scattered air. This likely still remains within normal limits given recent cholecystectomy. No additional fluid is seen tracking inferiorly to suggest a bile leak. There is also partial decompression of the adjacent hepatic cyst, now measuring 2.4 cm in size, likely reflecting partial emptying of the cyst into the gallbladder fossa. Mild residual soft tissue inflammation is noted at the right upper quadrant, extending to the adjacent hepatic flexure of the colon.  Additional scattered hypodensities within the liver are stable and likely reflect cysts. The spleen is unremarkable in appearance. The pancreas and adrenal glands are within normal limits.  The kidneys are unremarkable in appearance. There is no evidence  of hydronephrosis. No renal or ureteral stones are seen. No perinephric stranding is appreciated.  There is herniation of a short segment of proximal to mid ileum into a small periumbilical hernia just inferior to the umbilicus, new from the prior study. This likely reflects recent instrumentation at the umbilicus. Mild associated soft tissue inflammation is seen. This causes bowel obstruction, with mild diffuse dilatation of more proximal small bowel loops. Manual reduction would likely be difficult, though it may be possible. More distal small bowel loops are entirely decompressed.  The stomach is within normal limits. No acute  vascular abnormalities are seen.  The appendix is normal in caliber, without evidence for appendicitis. As described above, there is mild soft tissue inflammation at the hepatic flexure of the colon, reflecting the adjacent gallbladder fossa process. Two small diverticula are noted at the hepatic flexure of the colon. The colon is largely decompressed. Residual contrast is seen within the rectum.  The bladder is mildly distended and grossly unremarkable in appearance. The uterus is within normal limits. The ovaries are relatively symmetric; no suspicious adnexal masses are seen. Trace free fluid within the pelvis is likely physiologic in nature. No inguinal lymphadenopathy is seen.  No acute osseous abnormalities are identified. There is grade 1 anterolisthesis of L4 on L5, and vacuum phenomenon is noted at L4-L5 and L5-S1. Underlying facet disease is noted.  IMPRESSION: 1. Small bowel obstruction noted, due to herniation of a short segment of proximal to mid ileum into a small periumbilical hernia just inferior to the umbilicus, new from the prior study. This likely reflects recent instrumentation at the umbilicus; mild associated soft tissue inflammation seen. Manual reduction would likely be difficult, though it may be possible. 2. Fluid noted tracking about the gallbladder fossa, with minimal scattered air. This likely remains within normal limits given recent cholecystectomy. No definite evidence for bile leak. Partial decompression of the adjacent hepatic cyst likely reflects partial emptying of the cyst into the gallbladder fossa. Mild residual soft tissue inflammation at the right upper quadrant extends to the adjacent hepatic flexure of the colon. 3. Minimal diverticulosis at the hepatic flexure of the colon, without evidence of diverticulitis. 4. Small hepatic cysts seen. 5. Mild degenerative change at the lower lumbar spine.  These results were called by telephone at the time of interpretation on  08/18/2013 at 9:35 PM to Dr. Purvis Sheffield, who verbally acknowledged these results.   Electronically Signed   By: Roanna Raider M.D.   On: 08/18/2013 21:37   Dg Abd 2 Views  08/18/2013   CLINICAL DATA:  Status post lap chole, no bowel movement for 1 week  EXAM: ABDOMEN - 2 VIEW  COMPARISON:  None  FINDINGS: Right upper quadrant cholecystectomy clips. In the mid abdomen there are few dilated loops of small bowel with air-fluid levels, measuring up to about 4.5 cm. Although oral contrast is seen in the distal colon there is minimal gas other than in the mid abdominal small bowel loops.  IMPRESSION: Findings suggest small bowel obstruction   Electronically Signed   By: Esperanza Heir M.D.   On: 08/18/2013 19:12    Anti-infectives: Anti-infectives   None       Assessment/Plan pSBO secondary to incisional hernia Acute cholecystitis s/p ERCP and retrieval of CBD stones, Lap chole, attempted IOC on 08/13/13 DM type II Hypoglycemia HTN H/o stroke on Plavix  Plan: 1.  Patient is back on plavix so thus this should continue to be held for repair (last took  on Sunday am) 2.  NPO, IVF, pain control, antiemetics 3.  Possibly OR today or more likely tomorrow 4.  Ambulate and IS 5.  SCD's and hold anticoag 6.  SSI for DM control and cbg's, change IVF to D5NS due to hypoglycemia    LOS: 1 day    DORT, Shamariah Shewmake 08/19/2013, 10:11 AM Pager: [phone]

## 2013-08-20 ENCOUNTER — Encounter (HOSPITAL_COMMUNITY): Admission: EM | Disposition: A | Discharge status: Home / Self Care | Payer: Self-pay | Source: Home / Self Care

## 2013-08-20 ENCOUNTER — Inpatient Hospital Stay (HOSPITAL_COMMUNITY): Payer: Medicare Other | Admitting: Critical Care Medicine

## 2013-08-20 ENCOUNTER — Encounter (HOSPITAL_COMMUNITY): Payer: Medicare Other | Admitting: Critical Care Medicine

## 2013-08-20 ENCOUNTER — Encounter (HOSPITAL_COMMUNITY): Payer: Self-pay | Admitting: Critical Care Medicine

## 2013-08-20 DIAGNOSIS — K430 Incisional hernia with obstruction, without gangrene: Secondary | ICD-10-CM

## 2013-08-20 HISTORY — PX: INCISIONAL HERNIA REPAIR: SHX193

## 2013-08-20 LAB — GLUCOSE, CAPILLARY
Glucose-Capillary: 139 mg/dL — ABNORMAL HIGH (ref 70–99)
Glucose-Capillary: 122 mg/dL — ABNORMAL HIGH (ref 70–99)
Glucose-Capillary: 134 mg/dL — ABNORMAL HIGH (ref 70–99)

## 2013-08-20 LAB — CBC
HCT: 35.3 % — ABNORMAL LOW (ref 36.0–46.0)
Hemoglobin: 11.7 g/dL — ABNORMAL LOW (ref 12.0–15.0)
RBC: 4.02 MIL/uL (ref 3.87–5.11)
RDW: 16.6 % — ABNORMAL HIGH (ref 11.5–15.5)
WBC: 9.3 10*3/uL (ref 4.0–10.5)

## 2013-08-20 LAB — CREATININE, SERUM
GFR calc Af Amer: >90 mL/min (ref >90)
GFR calc non Af Amer: 83 mL/min — ABNORMAL LOW (ref >90)

## 2013-08-20 SURGERY — REPAIR, HERNIA, INCISIONAL
Anesthesia: General | Site: Abdomen

## 2013-08-20 MED ORDER — BUPIVACAINE-EPINEPHRINE 0.25% -1:200000 IJ SOLN
INTRAMUSCULAR | Status: DC | PRN
  Administered 2013-08-20: 10 mL

## 2013-08-20 MED ORDER — ROCURONIUM BROMIDE 100 MG/10ML IV SOLN
INTRAVENOUS | Status: DC | PRN
  Administered 2013-08-20: 30 mg via INTRAVENOUS
  Administered 2013-08-20: 5 mg via INTRAVENOUS

## 2013-08-20 MED ORDER — 0.9 % SODIUM CHLORIDE (POUR BTL) OPTIME
TOPICAL | Status: DC | PRN
  Administered 2013-08-20: 1000 mL

## 2013-08-20 MED ORDER — LACTATED RINGERS IV SOLN
INTRAVENOUS | Status: DC | PRN
  Administered 2013-08-20: 11:00:00 via INTRAVENOUS

## 2013-08-20 MED ORDER — PHENYLEPHRINE HCL 10 MG/ML IJ SOLN
10.0000 mg | INTRAVENOUS | Status: DC | PRN
  Administered 2013-08-20: 50 ug/min via INTRAVENOUS

## 2013-08-20 MED ORDER — ONDANSETRON HCL 4 MG/2ML IJ SOLN
4.0000 mg | Freq: Once | INTRAMUSCULAR | Status: AC
  Administered 2013-08-20: 4 mg via INTRAVENOUS

## 2013-08-20 MED ORDER — HEPARIN SODIUM (PORCINE) 5000 UNIT/ML IJ SOLN
5000.0000 [IU] | Freq: Three times a day (TID) | INTRAMUSCULAR | Status: DC
  Filled 2013-08-20: qty 1

## 2013-08-20 MED ORDER — HEPARIN SODIUM (PORCINE) 5000 UNIT/ML IJ SOLN: 5000.0000 [IU] | Freq: Three times a day (TID) | INTRAMUSCULAR | Status: DC

## 2013-08-20 MED ORDER — CEFAZOLIN SODIUM-DEXTROSE 2-3 GM-% IV SOLR
INTRAVENOUS | Status: AC
  Filled 2013-08-20: qty 50

## 2013-08-20 MED ORDER — ACETAMINOPHEN 650 MG RE SUPP
RECTAL | Status: AC
  Administered 2013-08-21: 650 mg via RECTAL
  Filled 2013-08-20: qty 1

## 2013-08-20 MED ORDER — ONDANSETRON HCL 4 MG/2ML IJ SOLN: 4.0000 mg | Freq: Once | INTRAMUSCULAR | Status: DC | PRN

## 2013-08-20 MED ORDER — SUCCINYLCHOLINE CHLORIDE 20 MG/ML IJ SOLN
INTRAMUSCULAR | Status: DC | PRN
  Administered 2013-08-20: 120 mg via INTRAVENOUS

## 2013-08-20 MED ORDER — HYDROMORPHONE HCL PF 1 MG/ML IJ SOLN
0.5000 mg | INTRAMUSCULAR | Status: DC | PRN
  Administered 2013-08-20 – 2013-08-21 (×2): 1 mg via INTRAVENOUS
  Filled 2013-08-20 (×2): qty 1

## 2013-08-20 MED ORDER — NEOSTIGMINE METHYLSULFATE 1 MG/ML IJ SOLN
INTRAMUSCULAR | Status: DC | PRN
  Administered 2013-08-20: 4 mg via INTRAVENOUS

## 2013-08-20 MED ORDER — ONDANSETRON HCL 4 MG/2ML IJ SOLN
INTRAMUSCULAR | Status: DC | PRN
  Administered 2013-08-20 (×2): 4 mg via INTRAVENOUS

## 2013-08-20 MED ORDER — HYDROMORPHONE HCL PF 1 MG/ML IJ SOLN
0.2500 mg | INTRAMUSCULAR | Status: DC | PRN
  Administered 2013-08-20 (×2): 0.5 mg via INTRAVENOUS

## 2013-08-20 MED ORDER — INSULIN ASPART 100 UNIT/ML ~~LOC~~ SOLN
0.0000 [IU] | SUBCUTANEOUS | Status: DC
  Administered 2013-08-20: 2 [IU] via SUBCUTANEOUS
  Administered 2013-08-21: 3 [IU] via SUBCUTANEOUS
  Administered 2013-08-21 – 2013-08-22 (×3): 2 [IU] via SUBCUTANEOUS

## 2013-08-20 MED ORDER — SODIUM CHLORIDE 0.9 % IV SOLN
1.0000 g | INTRAVENOUS | Status: AC
  Administered 2013-08-20: 1 g via INTRAVENOUS
  Filled 2013-08-20: qty 1

## 2013-08-20 MED ORDER — GLYCOPYRROLATE 0.2 MG/ML IJ SOLN
INTRAMUSCULAR | Status: DC | PRN
  Administered 2013-08-20: 0.6 mg via INTRAVENOUS

## 2013-08-20 MED ORDER — LIDOCAINE HCL (CARDIAC) 20 MG/ML IV SOLN
INTRAVENOUS | Status: DC | PRN
  Administered 2013-08-20: 50 mg via INTRAVENOUS

## 2013-08-20 MED ORDER — WHITE PETROLATUM GEL
Status: AC
  Filled 2013-08-20: qty 5

## 2013-08-20 MED ORDER — PHENYLEPHRINE HCL 10 MG/ML IJ SOLN
INTRAMUSCULAR | Status: DC | PRN
  Administered 2013-08-20 (×2): 80 ug via INTRAVENOUS
  Administered 2013-08-20: 120 ug via INTRAVENOUS
  Administered 2013-08-20: 80 ug via INTRAVENOUS

## 2013-08-20 MED ORDER — HYDROMORPHONE HCL PF 1 MG/ML IJ SOLN
INTRAMUSCULAR | Status: AC
  Filled 2013-08-20: qty 1

## 2013-08-20 MED ORDER — BUPIVACAINE-EPINEPHRINE (PF) 0.25% -1:200000 IJ SOLN
INTRAMUSCULAR | Status: AC
  Filled 2013-08-20: qty 30

## 2013-08-20 MED ORDER — KCL IN DEXTROSE-NACL 20-5-0.45 MEQ/L-%-% IV SOLN
INTRAVENOUS | Status: AC
  Filled 2013-08-20: qty 1000

## 2013-08-20 MED ORDER — ARTIFICIAL TEARS OP OINT
TOPICAL_OINTMENT | OPHTHALMIC | Status: DC | PRN
  Administered 2013-08-20: 1 via OPHTHALMIC

## 2013-08-20 MED ORDER — FENTANYL CITRATE 0.05 MG/ML IJ SOLN
INTRAMUSCULAR | Status: DC | PRN
  Administered 2013-08-20: 100 ug via INTRAVENOUS
  Administered 2013-08-20: 50 ug via INTRAVENOUS

## 2013-08-20 MED ORDER — KCL IN DEXTROSE-NACL 20-5-0.45 MEQ/L-%-% IV SOLN
INTRAVENOUS | Status: DC
  Administered 2013-08-20: 100 mL/h via INTRAVENOUS
  Administered 2013-08-21 – 2013-08-22 (×3): via INTRAVENOUS
  Filled 2013-08-20 (×6): qty 1000

## 2013-08-20 MED ORDER — PROPOFOL 10 MG/ML IV BOLUS
INTRAVENOUS | Status: DC | PRN
  Administered 2013-08-20: 50 mg via INTRAVENOUS
  Administered 2013-08-20: 100 mg via INTRAVENOUS

## 2013-08-20 MED ORDER — LACTATED RINGERS IV SOLN
INTRAVENOUS | Status: DC | PRN
  Administered 2013-08-20: 10:00:00 via INTRAVENOUS

## 2013-08-20 SURGICAL SUPPLY — 51 items
APL SKNCLS STERI-STRIP NONHPOA (GAUZE/BANDAGES/DRESSINGS) ×2
BENZOIN TINCTURE PRP APPL 2/3 (GAUZE/BANDAGES/DRESSINGS) ×2 IMPLANT
CANISTER SUCTION 2500CC (MISCELLANEOUS) ×3 IMPLANT
CHLORAPREP W/TINT 26ML (MISCELLANEOUS) ×1 IMPLANT
COVER SURGICAL LIGHT HANDLE (MISCELLANEOUS) ×3 IMPLANT
DECANTER SPIKE VIAL GLASS SM (MISCELLANEOUS) ×3 IMPLANT
DRAIN HEMOVAC 1/8 X 5 (WOUND CARE) IMPLANT
DRAPE LAPAROSCOPIC ABDOMINAL (DRAPES) ×3 IMPLANT
DRAPE UTILITY 15X26 W/TAPE STR (DRAPE) ×6 IMPLANT
ELECT CAUTERY BLADE 6.4 (BLADE) ×3 IMPLANT
ELECT REM PT RETURN 9FT ADLT (ELECTROSURGICAL) ×3
ELECTRODE REM PT RTRN 9FT ADLT (ELECTROSURGICAL) ×2 IMPLANT
EVACUATOR SILICONE 100CC (DRAIN) IMPLANT
GLOVE BIO SURGEON STRL SZ7.5 (GLOVE) ×3 IMPLANT
GLOVE BIOGEL PI IND STRL 7.0 (GLOVE) ×4 IMPLANT
GLOVE BIOGEL PI IND STRL 7.5 (GLOVE) ×1 IMPLANT
GLOVE BIOGEL PI INDICATOR 7.0 (GLOVE) ×2
GLOVE BIOGEL PI INDICATOR 7.5 (GLOVE) ×1
GLOVE SURG ORTHO 8.0 STRL STRW (GLOVE) ×4 IMPLANT
GOWN STRL NON-REIN LRG LVL3 (GOWN DISPOSABLE) ×4 IMPLANT
GOWN STRL REIN XL XLG (GOWN DISPOSABLE) ×3 IMPLANT
KIT BASIN OR (CUSTOM PROCEDURE TRAY) ×3 IMPLANT
KIT ROOM TURNOVER OR (KITS) ×3 IMPLANT
NEEDLE 22X1 1/2 (OR ONLY) (NEEDLE) IMPLANT
NS IRRIG 1000ML POUR BTL (IV SOLUTION) ×3 IMPLANT
PACK GENERAL/GYN (CUSTOM PROCEDURE TRAY) ×3 IMPLANT
PAD ARMBOARD 7.5X6 YLW CONV (MISCELLANEOUS) ×6 IMPLANT
SPECIMEN JAR SMALL (MISCELLANEOUS) ×3 IMPLANT
SPONGE GAUZE 4X4 12PLY (GAUZE/BANDAGES/DRESSINGS) ×3 IMPLANT
STAPLER VISISTAT 35W (STAPLE) ×3 IMPLANT
STRIP CLOSURE SKIN 1/2X4 (GAUZE/BANDAGES/DRESSINGS) ×3 IMPLANT
SUT ETHIBOND NAB CT1 #1 30IN (SUTURE) IMPLANT
SUT ETHILON 3 0 FSL (SUTURE) IMPLANT
SUT MNCRL AB 4-0 PS2 18 (SUTURE) ×3 IMPLANT
SUT NOV 1 T60/GS (SUTURE) IMPLANT
SUT NOVA 1 T20/GS 25DT (SUTURE) ×2 IMPLANT
SUT NOVA NAB DX-16 0-1 5-0 T12 (SUTURE) ×3 IMPLANT
SUT PROLENE 0 CT 1 CR/8 (SUTURE) IMPLANT
SUT SILK 3 0 (SUTURE) ×3
SUT SILK 3 0 SH CR/8 (SUTURE) IMPLANT
SUT SILK 3-0 18XBRD TIE 12 (SUTURE) ×1 IMPLANT
SUT VIC AB 2-0 CT1 27 (SUTURE)
SUT VIC AB 2-0 CT1 TAPERPNT 27 (SUTURE) IMPLANT
SUT VIC AB 3-0 SH 27 (SUTURE) ×6
SUT VIC AB 3-0 SH 27X BRD (SUTURE) ×2 IMPLANT
SYR CONTROL 10ML LL (SYRINGE) IMPLANT
TAPE CLOTH SURG 4X10 WHT LF (GAUZE/BANDAGES/DRESSINGS) ×3 IMPLANT
TAPE CLOTH SURG 6X10 WHT LF (GAUZE/BANDAGES/DRESSINGS) ×3 IMPLANT
TOWEL OR 17X24 6PK STRL BLUE (TOWEL DISPOSABLE) ×3 IMPLANT
TOWEL OR 17X26 10 PK STRL BLUE (TOWEL DISPOSABLE) ×3 IMPLANT
WATER STERILE IRR 1000ML POUR (IV SOLUTION) ×1 IMPLANT

## 2013-08-20 NOTE — Progress Notes (Signed)
2340 Patient c/o indigestion and midsternum chest discomfort, P 98, b/p 155/82, O2sat 98%2346. Dr. Donell Beers notified orders received. 2354 Maalox 30ml given. 0030 Patient states she feels better, no c/o chest discomfort. Will coninue to monitor

## 2013-08-20 NOTE — Progress Notes (Signed)
Patient's fever up again at 103.6. [place] MD,made aware. Instructed to get incentive spirometry goal up to 750. Patient up to [facility] with 2 assist, no void. Back to bed, SCD's on, awake, washed. Encouraged cough and deep breathing.  Encourage activity. Will monitor. Sherlyn Lees, RN

## 2013-08-20 NOTE — Anesthesia Procedure Notes (Signed)
Procedure Name: Intubation Date/Time: 08/20/2013 10:51 AM Performed by: Elon Alas Pre-anesthesia Checklist: Patient identified, Timeout performed, Emergency Drugs available, Suction available and Patient being monitored Patient Re-evaluated:Patient Re-evaluated prior to inductionOxygen Delivery Method: Circle system utilized Preoxygenation: Pre-oxygenation with 100% oxygen Intubation Type: IV induction, Rapid sequence and Cricoid Pressure applied Laryngoscope Size: Mac and 3 Grade View: Grade I Tube type: Oral Tube size: 7.5 mm Number of attempts: 1 Airway Equipment and Method: Stylet Placement Confirmation: positive ETCO2,  ETT inserted through vocal cords under direct vision and breath sounds checked- equal and bilateral Secured at: 22 cm Tube secured with: Tape Dental Injury: Teeth and Oropharynx as per pre-operative assessment

## 2013-08-20 NOTE — Transfer of Care (Signed)
Immediate Anesthesia Transfer of Care Note  Patient: Tracy [name]  Procedure(s) Performed: Procedure(s): HERNIA REPAIR INCISIONAL UMBILICAL (N/A)  Patient Location: PACU  Anesthesia Type:General  Level of Consciousness: awake and alert   Airway & Oxygen Therapy: Patient Spontanous Breathing and Patient connected to nasal cannula oxygen  Post-op Assessment: Report given to PACU RN, Post -op Vital signs reviewed and stable and Patient moving all extremities X 4  Post vital signs: Reviewed and stable  Complications: none

## 2013-08-20 NOTE — Brief Op Note (Signed)
08/18/2013 - 08/20/2013  12:06 PM  PATIENT:  Tracy [name]  72 y.o. female  PRE-OPERATIVE DIAGNOSIS:  Incarcerated incisional hernia with small bowel obstruction  POST-OPERATIVE DIAGNOSIS:  same  PROCEDURE:  Repair incarcerated incisional hernia  SURGEON:  Surgeon(s) and Role:    * Velora Heckler, MD - Primary  ASSISTANTS:  Magnus Ivan, RNFA   ANESTHESIA:   general  EBL:  Total I/O In: 1300 [I.V.:1300] Out: 10 [Blood:10]  BLOOD ADMINISTERED:none  DRAINS: none   LOCAL MEDICATIONS USED:  MARCAINE     SPECIMEN:  No Specimen  DISPOSITION OF SPECIMEN:  N/A  COUNTS:  YES  TOURNIQUET:  * No tourniquets in log *  DICTATION: .Other Dictation: Dictation Number [id number]  PLAN OF CARE: Admit to inpatient   PATIENT DISPOSITION:  PACU - hemodynamically stable.   Delay start of Pharmacological VTE agent (>24hrs) due to surgical blood loss or risk of bleeding: yes  Velora Heckler, MD, [facility] Office: [phone]

## 2013-08-20 NOTE — Preoperative (Signed)
Beta Blockers   Reason not to administer Beta Blockers:Not Applicable 

## 2013-08-20 NOTE — Progress Notes (Signed)
Patient back from surgery. Report received from [place], [place]. Tylenol suppository given at 1315 for fever of 103.1.  Patient still febrile at 102.3 axillary at 1615. Tylenol as needed. Notify MD if fever continues. Aris Georgia, PA made aware. Discussed Heparin therapy. Discontinue Heparin until am. Will monitor comfort. Sherlyn Lees

## 2013-08-20 NOTE — Progress Notes (Signed)
Consent for surgery. Bath done. Patient aware of plan for surgery. Son given comfort. [place] called [place] [phone] in OR. Patient stable for transport. Sherlyn Lees, RN

## 2013-08-20 NOTE — Anesthesia Preprocedure Evaluation (Addendum)
Anesthesia Evaluation  Patient identified by MRN, date of birth, ID band Patient confused    Reviewed: Allergy & Precautions, H&P , NPO status , Patient's Chart, lab work & pertinent test results  Airway Mallampati: II TM Distance: >3 FB Neck ROM: Full    Dental  (+) Dental Advisory Given, Poor Dentition and Loose   Pulmonary          Cardiovascular hypertension, Pt. on medications     Neuro/Psych CVA (left sided weakness), Residual Symptoms    GI/Hepatic   Endo/Other  diabetes, Type 2, Oral Hypoglycemic AgentsMorbid obesity  Renal/GU      Musculoskeletal   Abdominal   Peds  Hematology   Anesthesia Other Findings N&V  Reproductive/Obstetrics                        Anesthesia Physical Anesthesia Plan  ASA: III  Anesthesia Plan: General   Post-op Pain Management:    Induction: Intravenous  Airway Management Planned: Oral ETT  Additional Equipment:   Intra-op Plan:   Post-operative Plan: Extubation in OR  Informed Consent: I have reviewed the patients History and Physical, chart, labs and discussed the procedure including the risks, benefits and alternatives for the proposed anesthesia with the patient or authorized representative who has indicated his/her understanding and acceptance.   Dental advisory given  Plan Discussed with: Anesthesiologist and Surgeon  Anesthesia Plan Comments:         Anesthesia Quick Evaluation

## 2013-08-20 NOTE — Progress Notes (Signed)
1610 Patient hds vomited large amount dark brown liquid while on the [facility] 0420. Patient then vomited x2 small amount of dark brown liquid in the basin. [id number] Dr. Donell Beers notified order received. 5409 Zofran 4mg  given IV. Patient resting quietly at the present. Will continue to monitor.

## 2013-08-21 ENCOUNTER — Encounter (HOSPITAL_COMMUNITY): Payer: Self-pay | Admitting: Surgery

## 2013-08-21 DIAGNOSIS — R5381 Other malaise: Secondary | ICD-10-CM

## 2013-08-21 LAB — GLUCOSE, CAPILLARY
Glucose-Capillary: 110 mg/dL — ABNORMAL HIGH (ref 70–99)
Glucose-Capillary: 138 mg/dL — ABNORMAL HIGH (ref 70–99)
Glucose-Capillary: 146 mg/dL — ABNORMAL HIGH (ref 70–99)
Glucose-Capillary: 94 mg/dL (ref 70–99)

## 2013-08-21 LAB — CBC
HCT: 37.4 % (ref 36.0–46.0)
MCH: 28.6 pg (ref 26.0–34.0)
Platelets: 419 10*3/uL — ABNORMAL HIGH (ref 150–400)
RBC: 4.26 MIL/uL (ref 3.87–5.11)
WBC: 7.1 10*3/uL (ref 4.0–10.5)

## 2013-08-21 LAB — BASIC METABOLIC PANEL
BUN: 5 mg/dL — ABNORMAL LOW (ref 6–23)
CO2: 26 mEq/L (ref 19–32)
Calcium: 7.4 mg/dL — ABNORMAL LOW (ref 8.4–10.5)
Creatinine, Ser: 0.89 mg/dL (ref 0.50–1.10)
GFR calc Af Amer: 73 mL/min — ABNORMAL LOW (ref >90)
GFR calc non Af Amer: 63 mL/min — ABNORMAL LOW (ref >90)
Glucose, Bld: 121 mg/dL — ABNORMAL HIGH (ref 70–99)
Sodium: 139 mEq/L (ref 135–145)

## 2013-08-21 MED ORDER — ACETAMINOPHEN 325 MG PO TABS
650.0000 mg | ORAL_TABLET | Freq: Four times a day (QID) | ORAL | Status: DC
  Administered 2013-08-21 – 2013-08-23 (×9): 650 mg via ORAL
  Filled 2013-08-21 (×10): qty 2

## 2013-08-21 MED ORDER — POTASSIUM CHLORIDE 10 MEQ/100ML IV SOLN
10.0000 meq | INTRAVENOUS | Status: AC
  Administered 2013-08-21 (×4): 10 meq via INTRAVENOUS
  Filled 2013-08-21: qty 100

## 2013-08-21 MED ORDER — ACETAMINOPHEN 650 MG RE SUPP
650.0000 mg | Freq: Four times a day (QID) | RECTAL | Status: DC
  Administered 2013-08-21 (×2): 650 mg via RECTAL
  Filled 2013-08-21 (×16): qty 1

## 2013-08-21 MED ORDER — FENTANYL CITRATE 0.05 MG/ML IJ SOLN
12.5000 ug | INTRAMUSCULAR | Status: DC | PRN
  Administered 2013-08-21: 25 ug via INTRAVENOUS
  Filled 2013-08-21: qty 2

## 2013-08-21 MED ORDER — ENOXAPARIN SODIUM 40 MG/0.4ML ~~LOC~~ SOLN
40.0000 mg | SUBCUTANEOUS | Status: DC
  Administered 2013-08-21 – 2013-08-23 (×3): 40 mg via SUBCUTANEOUS
  Filled 2013-08-21 (×6): qty 0.4

## 2013-08-21 NOTE — Progress Notes (Signed)
1 Day Post-Op  Subjective: Pt sedated with mits on.  Daughter and son at bedside concerned about pain meds.  NG tube in place.  Pt was very confused overnight.  Pain minimal.  Objective: Vital signs in last 24 hours: Temp:  [98.9 F (37.2 C)-103.6 F (39.8 C)] 99.8 F (37.7 C) (12/11 0635) Pulse Rate:  [107-135] 120 (12/11 0635) Resp:  [16-26] 18 (12/11 0635) BP: (107-157)/(53-87) 116/63 mmHg (12/11 0635) SpO2:  [89 %-99 %] 94 % (12/11 0635) Last BM Date: 08/12/13  Intake/Output from previous day: 12/10 0701 - 12/11 0700 In: 1800 [I.V.:1800] Out: 920 [Emesis/NG output:900; Blood:20] Intake/Output this shift:    PE: Gen:  Alert, NAD, pleasant Abd: Soft, minimal tenderness, ND, diminished BS, no HSM, incisions C/D/I   Lab Results:   Recent Labs  08/20/13 1500 08/21/13 0410  WBC 9.3 7.1  HGB 11.7* 12.2  HCT 35.3* 37.4  PLT 470* 419*   BMET  Recent Labs  08/18/13 1831  08/20/13 1500 08/21/13 0410  NA 140  --   --  139  K 3.2*  --   --  3.0*  CL 97  --   --  103  CO2 28  --   --  26  GLUCOSE 96  --   --  121*  BUN 9  --   --  5*  CREATININE 0.70  < > 0.74 0.89  CALCIUM 9.0  --   --  7.4*  < > = values in this interval not displayed. PT/INR No results found for this basename: LABPROT, INR,  in the last 72 hours CMP     Component Value Date/Time   NA 139 08/21/2013 0410   K 3.0* 08/21/2013 0410   CL 103 08/21/2013 0410   CO2 26 08/21/2013 0410   GLUCOSE 121* 08/21/2013 0410   BUN 5* 08/21/2013 0410   CREATININE 0.89 08/21/2013 0410   CALCIUM 7.4* 08/21/2013 0410   PROT 7.9 08/18/2013 1831   ALBUMIN 3.4* 08/18/2013 1831   AST 15 08/18/2013 1831   ALT 29 08/18/2013 1831   ALKPHOS 131* 08/18/2013 1831   BILITOT 0.6 08/18/2013 1831   GFRNONAA 63* 08/21/2013 0410   GFRAA 73* 08/21/2013 0410   Lipase     Component Value Date/Time   LIPASE 64* 08/18/2013 1831       Studies/Results: No results found.  Anti-infectives: Anti-infectives   Start      Dose/Rate Route Frequency Ordered Stop   08/20/13 1030  ertapenem (INVANZ) 1 g in sodium chloride 0.9 % 50 mL IVPB     1 g 100 mL/hr over 30 Minutes Intravenous To Surgery 08/20/13 1024 08/20/13 1055   08/20/13 1019  ceFAZolin (ANCEF) 2-3 GM-% IVPB SOLR    Comments:  Key, Kristopher   : cabinet override      08/20/13 1019 08/20/13 2229       Assessment/Plan pSBO secondary to incisional hernia POD #1 s/p repair of incarcerated umbilical incisional hernia Acute cholecystitis s/p ERCP and retrieval of CBD stones, Lap chole, attempted IOC on 08/13/13  Fevers - likely due to atelectasis Tmax 103.6 Deconditioning DM type II  Hypoglycemia  Hypokalemia - K+ 3.0 HTN  H/o stroke on Plavix   Plan:  1. Cont NG tube and NPO secondary to no return of bowel function 2. IVF with K+, pain control, antiemetics  3. 4 runs K+ for hypokalemia 4. Ambulate and IS  5. SCD's and lovenox 6. SSI for DM control and cbg's, change IVF  to D5NS due to hypoglycemia 7. Switch IV pain control to fentanyl and give rectal tylenol q6h to avoid sedation/confusion 8. PT/OT consult ordered    LOS: 3 days    DORT, Flannery Cavallero 08/21/2013, 8:26 AM Pager: [phone]

## 2013-08-21 NOTE — Anesthesia Postprocedure Evaluation (Signed)
  Anesthesia Post-op Note  Patient: Tracy [name]  Procedure(s) Performed: Procedure(s): HERNIA REPAIR INCISIONAL UMBILICAL (N/A)  Patient Location: PACU  Anesthesia Type:General  Level of Consciousness: awake, alert , oriented and patient cooperative  Airway and Oxygen Therapy: Patient Spontanous Breathing  Post-op Pain: mild  Post-op Assessment: Post-op Vital signs reviewed, Patient's Cardiovascular Status Stable, Respiratory Function Stable, Patent Airway, No signs of Nausea or vomiting and Pain level controlled  Post-op Vital Signs: stable  Complications: No apparent anesthesia complications

## 2013-08-21 NOTE — Progress Notes (Signed)
General Surgery [facility]  Patient seen and examined.  NG remains in place with relatively high output.  Await resolution of ileus.  Encouraged OOB to chair and IS use.  Family at bedside.  Velora Heckler, MD, [facility] Office: [phone]

## 2013-08-21 NOTE — Op Note (Signed)
NAMEESTEE, YOHE NO.:  [id number]  MEDICAL RECORD NO.:  [id number]  LOCATION:  6N20C                        FACILITY:  [facility]  PHYSICIAN:  Velora Heckler, MD      DATE OF BIRTH:  16-Jan-1941  DATE OF PROCEDURE:  08/20/2013                              OPERATIVE REPORT   PREOPERATIVE DIAGNOSIS:  Incarcerated incisional hernia with small bowel obstruction.  POSTOPERATIVE DIAGNOSIS:  Incarcerated incisional hernia with small  bowel obstruction.  PROCEDURE:  Repair incarcerated incisional hernia.  SURGEON:  Velora Heckler, MD, FACS  ASSISTANT:  Magnus Ivan, RNFA  ANESTHESIA:  General per Maren [place], M.D.  ESTIMATED BLOOD LOSS:  Minimal.  PREPARATION:  Betadine.  COMPLICATIONS:  None.  INDICATIONS:  The patient is a 72 year old female who underwent laparoscopic cholecystectomy 1 week ago.  Postoperatively, she developed pain and swelling at the umbilicus.  She developed signs and symptoms of intestinal obstruction.  She presented to the emergency room and was admitted to the General Surgery Service.  The patient was prepared and brought to the operating room for repair of incarcerated incisional hernia at the level of the umbilicus.  BODY OF REPORT:  Procedure was done in OR #1 at the [facility].  The patient was brought to the operating room, placed in supine position on the operating room table.  Following administration of general anesthesia, the patient was positioned and then prepped and draped in the usual aseptic fashion.  After ascertaining that an adequate level of anesthesia had been achieved, the patient's previous incision in the umbilicus was reopened with a #10 blade.  Dissection was carried.  Incision was extended inferiorly. Dissection was carried into the subcutaneous planes using the electrocautery for hemostasis.  Using careful dissection in the subcutaneous tissues, a loop of small bowel was identified.   It was cleared circumferentially down to the fascia.  Sutures in the fascia are extracted.  Fascial opening was stretched with a Kelly clamp and the small bowel was reduced back within the peritoneal cavity.  Small bowel loop was viable.  It was congested, but not ischemic.  There was no sign of perforation.  Subcutaneous tissues were then elevated off the fascial plane circumferentially using the electrocautery for hemostasis as well as 3-0 Vicryl suture ligatures.  Fascial edges are debrided to healthy tissue. A second hernia defect was noted under the umbilicus and this was opened and included in the main defect.  Next, the fascia was closed transversely with interrupted #1 Novafil simple sutures.  The fascia was closed without undue tension.  There was no need for biologic or mesh interposition.  Subcutaneous tissues are irrigated.  Good hemostasis was achieved. Subcutaneous tissues were closed with interrupted 3-0 Vicryl sutures. Skin was anesthetized with local Marcaine anesthetic.  Skin edges were reapproximated with a running 4-0 Monocryl subcuticular suture.  Wound was washed and dried and benzoin and Steri-Strips were applied.  Sterile dressings were applied.  The patient was awakened from anesthesia and brought to the recovery room.  The patient tolerated the procedure well.   Velora Heckler, MD, [facility] Office: [phone]   TMG/MEDQ  D:  08/20/2013  T:  08/21/2013  Job:  [id number]

## 2013-08-21 NOTE — Progress Notes (Signed)
Patient sat on the side of the bed before becoming exhausted and requesting to lie down.

## 2013-08-22 LAB — CBC
MCH: 28.7 pg (ref 26.0–34.0)
MCHC: 32.6 g/dL (ref 30.0–36.0)
MCV: 88 fL (ref 78.0–100.0)
Platelets: 445 10*3/uL — ABNORMAL HIGH (ref 150–400)
RBC: 3.76 MIL/uL — ABNORMAL LOW (ref 3.87–5.11)
WBC: 6.5 10*3/uL (ref 4.0–10.5)

## 2013-08-22 LAB — GLUCOSE, CAPILLARY
Glucose-Capillary: 116 mg/dL — ABNORMAL HIGH (ref 70–99)
Glucose-Capillary: 121 mg/dL — ABNORMAL HIGH (ref 70–99)
Glucose-Capillary: 136 mg/dL — ABNORMAL HIGH (ref 70–99)
Glucose-Capillary: 84 mg/dL (ref 70–99)

## 2013-08-22 LAB — BASIC METABOLIC PANEL
CO2: 24 mEq/L (ref 19–32)
Calcium: 7.7 mg/dL — ABNORMAL LOW (ref 8.4–10.5)
Creatinine, Ser: 0.74 mg/dL (ref 0.50–1.10)
GFR calc non Af Amer: 83 mL/min — ABNORMAL LOW (ref >90)
Glucose, Bld: 131 mg/dL — ABNORMAL HIGH (ref 70–99)
Sodium: 140 mEq/L (ref 135–145)

## 2013-08-22 MED ORDER — KCL IN DEXTROSE-NACL 40-5-0.9 MEQ/L-%-% IV SOLN
INTRAVENOUS | Status: DC
  Administered 2013-08-22 – 2013-08-23 (×2): via INTRAVENOUS
  Filled 2013-08-22 (×3): qty 1000

## 2013-08-22 MED ORDER — FENTANYL CITRATE 0.05 MG/ML IJ SOLN
12.5000 ug | Freq: Four times a day (QID) | INTRAMUSCULAR | Status: DC | PRN
  Administered 2013-08-23: 22:00:00 via INTRAVENOUS
  Filled 2013-08-22: qty 2

## 2013-08-22 MED ORDER — HYDROCODONE-ACETAMINOPHEN 5-325 MG PO TABS: 1.0000 | ORAL_TABLET | ORAL | Status: DC | PRN

## 2013-08-22 MED ORDER — TRAMADOL HCL 50 MG PO TABS
50.0000 mg | ORAL_TABLET | Freq: Four times a day (QID) | ORAL | Status: DC | PRN
  Administered 2013-08-24: 50 mg via ORAL
  Filled 2013-08-22: qty 1

## 2013-08-22 MED ORDER — MENTHOL 3 MG MT LOZG
1.0000 | LOZENGE | OROMUCOSAL | Status: DC | PRN
  Administered 2013-08-22: 3 mg via ORAL
  Filled 2013-08-22: qty 9

## 2013-08-22 NOTE — Progress Notes (Signed)
General Surgery [facility]  Patient up and ambulating with assistance this afternoon.  Seen in hallway and discussed with family.  NG out and on clear liquid diet.  Continues to progress.  Velora Heckler, MD, [facility] Office: [phone]

## 2013-08-22 NOTE — Progress Notes (Signed)
Tracy [name] NGT was pulled out while coughing hard and trying to ambulate to [facility]. Dr. Janee Morn notified of patient's status. Pt had a total of 400 ml output; also had 5 bowel movements last night.  MD will assess this morning. Will continue to monitor.

## 2013-08-22 NOTE — Progress Notes (Signed)
2 Days Post-Op  Subjective: Pt is much less confused today, very alert asking appropriate questions.  Children at bedside.  No N/V.  Coughed out her NG tube.  Had out yesterday, but was eating some ice chips.  Had 5 BM's yesterday and today.  She is very thirsty and wants to go home soon.  Pain well controlled with fentanyl.  Objective: Vital signs in last 24 hours: Temp:  [97.9 F (36.6 C)-100.9 F (38.3 C)] 98.7 F (37.1 C) (12/12 0650) Pulse Rate:  [98-113] 98 (12/12 0650) Resp:  [18] 18 (12/12 0650) BP: (94-120)/(59-64) 94/59 mmHg (12/12 0650) SpO2:  [96 %-100 %] 100 % (12/12 0650) Last BM Date: 08/21/13  Intake/Output from previous day: 12/11 0701 - 12/12 0700 In: 1000 [I.V.:1000] Out: 1300 [Emesis/NG output:1300] Intake/Output this shift:    PE: Gen:  Alert, NAD, pleasant Abd: Soft, mild tenderness, ND, +BS, no HSM, incisions C/D/I  Lab Results:   Recent Labs  08/21/13 0410 08/22/13 0545  WBC 7.1 6.5  HGB 12.2 10.8*  HCT 37.4 33.1*  PLT 419* 445*   BMET  Recent Labs  08/21/13 0410 08/22/13 0545  NA 139 140  K 3.0* 3.6  CL 103 106  CO2 26 24  GLUCOSE 121* 131*  BUN 5* 7  CREATININE 0.89 0.74  CALCIUM 7.4* 7.7*   PT/INR No results found for this basename: LABPROT, INR,  in the last 72 hours CMP     Component Value Date/Time   NA 140 08/22/2013 0545   K 3.6 08/22/2013 0545   CL 106 08/22/2013 0545   CO2 24 08/22/2013 0545   GLUCOSE 131* 08/22/2013 0545   BUN 7 08/22/2013 0545   CREATININE 0.74 08/22/2013 0545   CALCIUM 7.7* 08/22/2013 0545   PROT 7.9 08/18/2013 1831   ALBUMIN 3.4* 08/18/2013 1831   AST 15 08/18/2013 1831   ALT 29 08/18/2013 1831   ALKPHOS 131* 08/18/2013 1831   BILITOT 0.6 08/18/2013 1831   GFRNONAA 83* 08/22/2013 0545   GFRAA >90 08/22/2013 0545   Lipase     Component Value Date/Time   LIPASE 64* 08/18/2013 1831       Studies/Results: No results found.  Anti-infectives: Anti-infectives   Start     Dose/Rate  Route Frequency Ordered Stop   08/20/13 1030  ertapenem (INVANZ) 1 g in sodium chloride 0.9 % 50 mL IVPB     1 g 100 mL/hr over 30 Minutes Intravenous To Surgery 08/20/13 1024 08/20/13 1055   08/20/13 1019  ceFAZolin (ANCEF) 2-3 GM-% IVPB SOLR    Comments:  Key, Kristopher   : cabinet override      08/20/13 1019 08/20/13 2229       Assessment/Plan pSBO secondary to incisional hernia POD #2 s/p repair of incarcerated umbilical incisional hernia  Acute cholecystitis s/p ERCP and retrieval of CBD stones, Lap chole, attempted IOC on 08/13/13  Fevers - improved, likely due to atelectasis Tmax 100.9 Deconditioning  DM type II  Hypoglycemia  Hypokalemia - K+ 3.6 HTN  H/o stroke on Plavix   Plan:  1. NG tube was "coughed out", had multiple BM's, start clear liquids 2. IVFpain control, antiemetics  3. Switched IVF to D5NS with K 4. Ambulate and IS  5. SCD's and lovenox  6. SSI for DM control and cbg's 7. Wean Fentanyl, cont tylenol, and add ultram prn 8. PT/OT to eval today     LOS: 4 days    DORT, [facility] 08/22/2013, 8:07 AM Pager:  [phone]   

## 2013-08-22 NOTE — Evaluation (Signed)
Physical Therapy Evaluation Patient Details Name: Tracy [name] MRN: [id number] DOB: Aug 12, 1941 Today's Date: 08/22/2013 Time: 4098-1191 PT Time Calculation (min): 26 min  PT Assessment / Plan / Recommendation History of Present Illness  Pt. is s/p repair of incarcerated incisional hernia with SBO and post op ileus.  Hospitalization last week for cholecystitis.  History of CVA with R sided weakness and dragging of R LE per son  Clinical Impression  Pt. Presents to PT with a decline in her usual functional mobility and gait status and will benefit from acut PT to address these and below issues in preparation for DCing home with 24 hour assist from family    PT Assessment  Patient needs continued PT services    Follow Up Recommendations  Home health PT;Supervision/Assistance - 24 hour;Supervision for mobility/OOB    Does the patient have the potential to tolerate intense rehabilitation      Barriers to Discharge   2 sons and 1 daughter present during session and assure this therapist that they will be available 24/7 on a rotating basis to provide current level of assist    Equipment Recommendations  None recommended by PT (has equipment in the home already)    Recommendations for Other Services OT consult   Frequency Min 3X/week    Precautions / Restrictions Precautions Precautions: Fall Precaution Comments: family reports pt has fallen multiple times at home  Required Braces or Orthoses:  (son says there is no brace for R LE) Restrictions Weight Bearing Restrictions: No   Pertinent Vitals/Pain Denies pain      Mobility  Bed Mobility Bed Mobility: Supine to Sit;Sitting - Scoot to Edge of Bed Supine to Sit: 1: +2 Total assist;HOB elevated Supine to Sit: Patient Percentage: 40% Sitting - Scoot to Edge of Bed: 3: Mod assist Details for Bed Mobility Assistance: Pt. was not willing to attempt to initiate moving to EOB and insisted that her son assist her.  Unsure what her  actual abilities are but at present she required 2 assist for moving to EOB.   Transfers Transfers: Sit to Stand;Stand to Sit Sit to Stand: 1: +2 Total assist;From bed;With upper extremity assist Sit to Stand: Patient Percentage: 40% Stand to Sit: 2: Max assist;To chair/3-in-1;Without upper extremity assist Details for Transfer Assistance: Pt. needed assist to stand and did not have good control in sitting back down.  sons say this sitting pattern is her baseline.   Ambulation/Gait Ambulation/Gait Assistance: 3: Mod assist Ambulation Distance (Feet): 20 Feet Assistive device: Rolling walker Ambulation/Gait Assistance Details: Pt. with difficulty advancing R LE and at times drags it.  She is able to take a nice step when cued but is not able to sustain this pattern on her own.  Flexed trunk in ambulation.  Second prson to manage equipment Gait Pattern: Step-to pattern;Decreased stride length;Trunk flexed;Narrow base of support;Shuffle Gait velocity: very decreased Stairs: No    Exercises     PT Diagnosis: Difficulty walking;Abnormality of gait;Generalized weakness  PT Problem List: Decreased strength;Decreased balance;Decreased mobility;Decreased safety awareness;Obesity;Decreased knowledge of precautions PT Treatment Interventions: DME instruction;Gait training;Stair training;Functional mobility training;Therapeutic activities;Balance training;Patient/family education     PT Goals(Current goals can be found in the care plan section) Acute Rehab PT Goals Patient Stated Goal: to go home PT Goal Formulation: With patient Time For Goal Achievement: 08/29/13 Potential to Achieve Goals: Fair  Visit Information  Last PT Received On: 08/22/13 Assistance Needed: +2 History of Present Illness: Pt. is s/p repair of incarcerated incisional hernia  with SBO and post op ileus.  Hospitalization last week for cholecystitis.  History of CVA with R sided weakness and dragging of R LE per son        Prior Functioning  Home Living Family/patient expects to be discharged to:: Private residence Living Arrangements: Spouse/significant other;Children Available Help at Discharge: Family;Available 24 hours/day Type of Home: House Home Access: Stairs to enter [employer] of Steps: 4 Entrance Stairs-Rails: Right Home Layout: One level Home Equipment: Cane - single point;Walker - 2 wheels;Bedside commode Additional Comments: pt lives husband, who is blind, was independent prior torecent and this  admission Prior Function Level of Independence: Independent with assistive device(s) Comments: amb with SPC; another son reported pt had to have help walking at church and around house at times   Communication Communication: Expressive difficulties;HOH Dominant Hand: Right    Cognition  Cognition Arousal/Alertness: Awake/alert Behavior During Therapy: WFL for tasks assessed/performed Overall Cognitive Status: Impaired/Different from baseline Area of Impairment: Orientation;Following commands;Safety/judgement;Problem solving;Attention Orientation Level: Disoriented to;Situation;Place;Time Current Attention Level: Sustained Memory: Decreased short-term memory Following Commands: Follows one step commands with increased time;Follows one step commands inconsistently (and with prompting form children) Safety/Judgement: Decreased awareness of deficits;Decreased awareness of safety Problem Solving: Slow processing;Decreased initiation;Difficulty sequencing;Requires verbal cues;Requires tactile cues General Comments: Pt. insists she is going home today, though her children recognize that she is not leaving today.    Extremity/Trunk Assessment Upper Extremity Assessment Upper Extremity Assessment: Defer to OT evaluation Lower Extremity Assessment Lower Extremity Assessment: RLE deficits/detail RLE Deficits / Details: deficits from CVa, grossly in 3-/5 range Cervical / Trunk  Assessment Cervical / Trunk Assessment: Kyphotic   Balance    End of Session PT - End of Session Equipment Utilized During Treatment: Gait belt Activity Tolerance: Patient limited by fatigue Patient left: in chair;with call bell/phone within reach;with family/visitor present Nurse Communication: Mobility status  GP     Ferman Hamming 08/22/2013, 3:51 PM Weldon Picking PT Acute Rehab Services [phone] Beeper [phone]

## 2013-08-23 LAB — GLUCOSE, CAPILLARY
Glucose-Capillary: 110 mg/dL — ABNORMAL HIGH (ref 70–99)
Glucose-Capillary: 80 mg/dL (ref 70–99)
Glucose-Capillary: 88 mg/dL (ref 70–99)

## 2013-08-23 LAB — BASIC METABOLIC PANEL
BUN: 6 mg/dL (ref 6–23)
CO2: 23 mEq/L (ref 19–32)
Chloride: 108 mEq/L (ref 96–112)
Creatinine, Ser: 0.58 mg/dL (ref 0.50–1.10)
Glucose, Bld: 116 mg/dL — ABNORMAL HIGH (ref 70–99)
Potassium: 5 mEq/L (ref 3.5–5.1)

## 2013-08-23 MED ORDER — SPIRONOLACTONE 25 MG PO TABS
25.0000 mg | ORAL_TABLET | Freq: Two times a day (BID) | ORAL | Status: DC
  Administered 2013-08-23 – 2013-08-24 (×2): 25 mg via ORAL
  Filled 2013-08-23 (×3): qty 1

## 2013-08-23 MED ORDER — PAROXETINE HCL 20 MG PO TABS
40.0000 mg | ORAL_TABLET | ORAL | Status: DC
  Administered 2013-08-23 – 2013-08-24 (×2): 40 mg via ORAL
  Filled 2013-08-23 (×3): qty 2

## 2013-08-23 MED ORDER — LORATADINE 10 MG PO TABS
10.0000 mg | ORAL_TABLET | Freq: Every day | ORAL | Status: DC
  Administered 2013-08-23 – 2013-08-24 (×2): 10 mg via ORAL
  Filled 2013-08-23 (×2): qty 1

## 2013-08-23 MED ORDER — CLOPIDOGREL BISULFATE 75 MG PO TABS
75.0000 mg | ORAL_TABLET | Freq: Every evening | ORAL | Status: DC
  Administered 2013-08-23: 75 mg via ORAL
  Filled 2013-08-23 (×2): qty 1

## 2013-08-23 MED ORDER — BUPROPION HCL ER (XL) 150 MG PO TB24
150.0000 mg | ORAL_TABLET | Freq: Every day | ORAL | Status: DC
  Administered 2013-08-23 – 2013-08-24 (×2): 150 mg via ORAL
  Filled 2013-08-23 (×2): qty 1

## 2013-08-23 MED ORDER — INSULIN ASPART 100 UNIT/ML ~~LOC~~ SOLN: 0.0000 [IU] | Freq: Three times a day (TID) | SUBCUTANEOUS | Status: DC

## 2013-08-23 NOTE — Progress Notes (Signed)
PT Cancellation Note  Patient Details Name: Tracy [name] MRN: [id number] DOB: [birth year]   Cancelled Treatment:  Pt adamantly refusing PT at this time. Agreeable to PT follow up later today as time allows vs another day.   Sallyanne Kuster 08/23/2013, 12:52 PM  Sallyanne Kuster, PTA Office- [phone]

## 2013-08-23 NOTE — Progress Notes (Signed)
Agree with A&P of ER,NP. Patient progressing well, up in chair tolerating diet

## 2013-08-23 NOTE — Progress Notes (Signed)
3 Days Post-Op  Subjective: Pt feels good, no abd pain, n/v.  Had several BMs yesterday.  Ambulating in hallways.    Objective: Vital signs in last 24 hours: Temp:  [97.6 F (36.4 C)-98.8 F (37.1 C)] 97.6 F (36.4 C) (12/13 0529) Pulse Rate:  [95-103] 95 (12/13 0529) Resp:  [18] 18 (12/13 0529) BP: (95-110)/(51-64) 110/56 mmHg (12/13 0529) SpO2:  [93 %-98 %] 94 % (12/13 0529) Last BM Date: 08/22/13  Intake/Output from previous day: 12/12 0701 - 12/13 0700 In: 1610 [I.V.:5638] Out: -  Intake/Output this shift:    General appearance: alert, cooperative and no distress Resp: clear to auscultation bilaterally Cardio: regular rate and rhythm, S1, S2 normal, no murmur, click, rub or gallop GI: soft, non-tender; bowel sounds normal; no masses,  no organomegaly.  Midline incision is c/d/i, steri-strips Extremities: extremities normal, atraumatic, no cyanosis or edema  Lab Results:   Recent Labs  08/21/13 0410 08/22/13 0545  WBC 7.1 6.5  HGB 12.2 10.8*  HCT 37.4 33.1*  PLT 419* 445*   BMET  Recent Labs  08/22/13 0545 08/23/13 0950  NA 140 141  K 3.6 5.0  CL 106 108  CO2 24 23  GLUCOSE 131* 116*  BUN 7 6  CREATININE 0.74 0.58  CALCIUM 7.7* 7.9*   PT/INR No results found for this basename: LABPROT, INR,  in the last 72 hours ABG No results found for this basename: PHART, PCO2, PO2, HCO3,  in the last 72 hours  Studies/Results: No results found.  Anti-infectives: Anti-infectives   Start     Dose/Rate Route Frequency Ordered Stop   08/20/13 1030  ertapenem (INVANZ) 1 g in sodium chloride 0.9 % 50 mL IVPB     1 g 100 mL/hr over 30 Minutes Intravenous To Surgery 08/20/13 1024 08/20/13 1055   08/20/13 1019  ceFAZolin (ANCEF) 2-3 GM-% IVPB SOLR    Comments:  Key, Kristopher   : cabinet override      08/20/13 1019 08/20/13 2229      Assessment/Plan: pSBO secondary to incisional hernia POD #3 s/p repair of incarcerated umbilical incisional hernia  Acute  cholecystitis s/p ERCP and retrieval of CBD stones, Lap chole, attempted IOC on 08/13/13  Full liquid diet, Advance diet as tolerated Mobilize Ultram and tylenol for pain vte prophylaxis; lovenox, SCDs DC IVF Deconditioning -PT/OT eval and treat DM type II-resume metformin tomorrow Hypoglycemia-tolerating orals, reduce SSI to TID with meals.   Hypokalemia -resolved HTN -resume home meds H/o stroke-resume plavix  Dispo-anticipate discharge tomorrow   LOS: 5 days    Riyan Gavina ANP-BC 08/23/2013 11:06 AM

## 2013-08-24 LAB — GLUCOSE, CAPILLARY: Glucose-Capillary: 103 mg/dL — ABNORMAL HIGH (ref 70–99)

## 2013-08-24 MED ORDER — TRAMADOL HCL 50 MG PO TABS: 50.0000 mg | ORAL_TABLET | Freq: Four times a day (QID) | ORAL | Status: DC | PRN

## 2013-08-24 NOTE — Progress Notes (Signed)
Safety sitter unavailable ...remains confused, agitated,trying to get OOB,wanting to go home, up in chair at nurses station.Back to room asleep @0200  did not awaken for hrs. between 0200 and present

## 2013-08-24 NOTE — Progress Notes (Signed)
4 Days Post-Op   Assessment: s/p Procedure(s): HERNIA REPAIR INCISIONAL UMBILICAL Patient Active Problem List   Diagnosis Date Noted  . Incarcerated incisional hernia 08/20/2013  . Incisional hernia 08/18/2013  . Choledocholithiasis 08/11/2013  . Acute cholecystitis 08/10/2013  . DM (diabetes mellitus) 08/10/2013  . HTN (hypertension) 08/10/2013  . History of CVA (cerebrovascular accident) 08/10/2013  . Cholecystitis 08/10/2013    Doiing well surgically Continues to have some confusion, not sure of her baseline. She had confusion on original admission as well  Plan: Discharge after discussions with family about her confusion. Want to be sure this is stable for her  Subjective: No abd c/o and wants to go home  Objective: Vital signs in last 24 hours: Temp:  [96.8 F (36 C)-99.1 F (37.3 C)] 99.1 F (37.3 C) (12/14 0648) Pulse Rate:  [95-102] 95 (12/14 0648) Resp:  [18-20] 18 (12/14 0648) BP: (109-125)/(61-73) 121/61 mmHg (12/14 0648) SpO2:  [98 %-100 %] 100 % (12/14 0648)   Intake/Output from previous day: 12/13 0701 - 12/14 0700 In: 120 [P.O.:120] Out: -   General appearance: alert, cooperative, no distress and seems somewhat confused. This may be her baseline Resp: clear to auscultation bilaterally GI: soft, non-tender; bowel sounds normal; no masses,  no organomegaly  Incision: healing well  Lab Results:   Recent Labs  08/22/13 0545  WBC 6.5  HGB 10.8*  HCT 33.1*  PLT 445*   BMET  Recent Labs  08/22/13 0545 08/23/13 0950  NA 140 141  K 3.6 5.0  CL 106 108  CO2 24 23  GLUCOSE 131* 116*  BUN 7 6  CREATININE 0.74 0.58  CALCIUM 7.7* 7.9*    MEDS, Scheduled . acetaminophen  650 mg Rectal Q6H   Or  . acetaminophen  650 mg Oral Q6H  . buPROPion  150 mg Oral Daily  . clopidogrel  75 mg Oral QPM  . enoxaparin (LOVENOX) injection  40 mg Subcutaneous Q24H  . insulin aspart  0-15 Units Subcutaneous TID WC  . loratadine  10 mg Oral Daily  .  pantoprazole (PROTONIX) IV  40 mg Intravenous QHS  . PARoxetine  40 mg Oral [id number]  . spironolactone  25 mg Oral BID    Studies/Results: No results found.    LOS: 6 days     Currie Paris, MD, [facility], [place] [phone]   08/24/2013 8:09 AM

## 2013-08-25 NOTE — Discharge Summary (Signed)
Physician Discharge Summary  Patient ID: Tracy [name] MRN: [id number] DOB/AGE: 1941-05-31 72 y.o.  Admit date: 08/18/2013 Discharge date: 08/25/2013  Admitting Diagnosis: Incarcerated umbilical incisional hernia Nausea/vomiting  Discharge Diagnosis Patient Active Problem List   Diagnosis Date Noted  . Incarcerated incisional hernia 08/20/2013  . Incisional hernia 08/18/2013  . Choledocholithiasis 08/11/2013  . Acute cholecystitis 08/10/2013  . DM (diabetes mellitus) 08/10/2013  . HTN (hypertension) 08/10/2013  . History of CVA (cerebrovascular accident) 08/10/2013  . Cholecystitis 08/10/2013    Consultants None this admission  Imaging: No results found.  Procedures Dr. Jamey Ripa (08/13/13) - Laparoscopic Cholecystectomy with IOC Dr. Gerrit Friends (08/21/13) - Incarcerated incisional hernia repair  Hospital Course:  72 y/o female who was admitted on 11/30 for cholecystitis and choledocholithiasis. Takes plavix. Had elevated bilirubin and underwent mrcp with stones. She first underwent ercp that had a couple stones extracted. Following this on 12/3 she underwent lap chole by Dr Jamey Ripa. She eventually was discharged home on 5 d abx which she should complete today. Her family states that she had vomiting on Friday and Saturday. She hasnt been eating much. Then she had recurrence of n/v yesterday which continued until today. Her last emesis was about 3 o'clock today. She last took plavix yesterday (she thinks in am). She reports no abdominal pain. She underwent plain films which shows possible bowel obstruction and then ct which shows what appears to be early incisional umbilical hernia.   Patient was admitted with conservative treatment with NG tube, bowel rest, and pain control.  She had to be weaned back off her Plavix in order to undergo the procedure listed above.  Tolerated procedure well and was transferred to the floor.  She experienced a post-operative ileus with high NG output.  On  POD #2 the NG tube fell out and she was started on clear liquids due to improved bowel function.  Diet was advanced as tolerated.  On POD #4, the patient was voiding well, tolerating diet, ambulating well, pain well controlled, vital signs stable, incisions c/d/i and felt stable for discharge home.  Patient will follow up in our office in 1 week and knows to call with questions or concerns.      Medication List    STOP taking these medications       ciprofloxacin 250 MG tablet  Commonly known as:  CIPRO     metroNIDAZOLE 500 MG tablet  Commonly known as:  FLAGYL      TAKE these medications       acetaminophen 325 MG tablet  Commonly known as:  TYLENOL  Take 650 mg by mouth every 6 (six) hours as needed for pain.     buPROPion 150 MG 24 hr tablet  Commonly known as:  WELLBUTRIN XL  Take 150 mg by mouth daily.     clopidogrel 75 MG tablet  Commonly known as:  PLAVIX  Take 75 mg by mouth every evening.     loratadine 10 MG tablet  Commonly known as:  CLARITIN  Take 10 mg by mouth daily.     metFORMIN 500 MG tablet  Commonly known as:  GLUCOPHAGE  Take 1,000 mg by mouth 2 (two) times daily with a meal.     PARoxetine 40 MG tablet  Commonly known as:  PAXIL  Take 40 mg by mouth every morning.     spironolactone 25 MG tablet  Commonly known as:  ALDACTONE  Take 25 mg by mouth 2 (two) times daily.  traMADol 50 MG tablet  Commonly known as:  ULTRAM  Take 1 tablet (50 mg total) by mouth every 6 (six) hours as needed for moderate pain or severe pain.             Follow-up Information   Follow up with Currie Paris, MD On 08/29/2013. (Your appointment is at 8:40, please arrive at least 30 minutes before your appointment to complete your check in paperwork.  If you are unable to arrive 30 minutes prior to your appointment time we may have to cancel or reschedule you.)    Specialty:  General Surgery   Contact information:   [address] [place] [address] [phone]       Signed: Candiss Norse [facility] [phone]  08/25/2013, 10:29 AM

## 2013-08-29 ENCOUNTER — Ambulatory Visit (INDEPENDENT_AMBULATORY_CARE_PROVIDER_SITE_OTHER): Payer: Medicare Other | Admitting: Surgery

## 2013-08-29 ENCOUNTER — Encounter (INDEPENDENT_AMBULATORY_CARE_PROVIDER_SITE_OTHER): Payer: Self-pay | Admitting: Surgery

## 2013-08-29 VITALS — BP 138/84 | HR 88 | Resp 18 | Ht 66.0 in | Wt 197.0 lb

## 2013-08-29 DIAGNOSIS — Z09 Encounter for follow-up examination after completed treatment for conditions other than malignant neoplasm: Secondary | ICD-10-CM

## 2013-08-29 NOTE — Progress Notes (Signed)
Tracy [name]                                            DOB: 04-30-1941 DATE: 08/29/2013                                                   MRN: [id number]  CC:  Chief Complaint  Patient presents with  . Follow-up    po LAP CHOLE    HPI: This patient comes in for post op follow-up .Sheunderwent lap chole on 12/4, then repair of incarcerated incisional hernia on 12/11. She feels that she is doing well.Her family feels she is actually dong better than before her first surgery. She is gaining strength, minimal pain and eating normally  PE:  VITAL SIGNS: BP 138/84  Pulse 88  Resp 18  Ht 5\' 6"  (1.676 m)  Wt 89.359 kg (197 lb)  BMI 31.81 kg/m2  General: The patient appears to be healthy, NAD Lungs: Clear Abd: soft and benign, wounds healing nicely   IMPRESSION: The patient is doing well S/P Lap chole, and repair of Incisional hernia.    PLAN: Gradually increase activities Recheck three weeks

## 2013-08-29 NOTE — Patient Instructions (Signed)
Come back for a final visit in about three weeks

## 2013-09-09 ENCOUNTER — Telehealth (INDEPENDENT_AMBULATORY_CARE_PROVIDER_SITE_OTHER): Payer: Self-pay

## 2013-09-09 NOTE — Telephone Encounter (Signed)
Pt's son calling to report that the pt has been complaining of stomach upset the past couple of days. I asked to speak with Ms. Molter directly and asked her if her bowels were moving, if she was feverish, and was she in pain.  She denied all these symptoms.  Her son came back on the phone and said he has not given her any pain medication in several days since she has not been complaining of pain.  I advised them to contact the pt's PCP for f/u of her stomach discomfort.  Her son agreed with this POC.

## 2013-09-30 ENCOUNTER — Telehealth: Payer: Self-pay | Admitting: Internal Medicine

## 2013-09-30 NOTE — Telephone Encounter (Signed)
Mrs. Tracy [name] is Tracy [name]'s wife.  She would like to be a new patient with Dr. Posey ReaPlotnikov.  She has [employer].

## 2013-10-01 NOTE — Telephone Encounter (Signed)
Ok Thx 

## 2013-10-02 NOTE — Telephone Encounter (Signed)
LMOM for Tracy [name] to call back.

## 2013-10-02 NOTE — Telephone Encounter (Signed)
Appt made for Jan. 26.

## 2013-10-03 ENCOUNTER — Encounter (INDEPENDENT_AMBULATORY_CARE_PROVIDER_SITE_OTHER): Payer: Self-pay | Admitting: Surgery

## 2013-10-03 ENCOUNTER — Ambulatory Visit (INDEPENDENT_AMBULATORY_CARE_PROVIDER_SITE_OTHER): Payer: Medicare HMO | Admitting: Surgery

## 2013-10-03 VITALS — BP 128/68 | HR 68 | Temp 98.0°F | Resp 18 | Ht 64.0 in | Wt 177.0 lb

## 2013-10-03 DIAGNOSIS — K430 Incisional hernia with obstruction, without gangrene: Secondary | ICD-10-CM

## 2013-10-03 NOTE — Patient Instructions (Signed)
  COCOA BUTTER & VITAMIN E CREAM  ([employer] or other brand)  Apply cocoa butter/vitamin E cream to your incision 2 - 3 times daily.  Massage cream into incision for one minute with each application.  Use sunscreen (50 SPF or higher) for first 6 months after surgery if area is exposed to sun.  You may substitute Mederma or other scar reducing creams as desired.   

## 2013-10-03 NOTE — Progress Notes (Signed)
General Surgery [facility]  Chief Complaint  Patient presents with  . Routine Post Op    repair incarcerated incisional hernia 08/20/2013    HISTORY: Patient is a 73 year old female who underwent laparoscopic cholecystectomy in early December 2014. Postoperative course was complicated by acute incisional hernia with bowel obstruction. She was returned to the operating room and underwent primary repair. She returns today for a wound check.  [name] notes that she is eating poorly. She appears to have no energy. She has had significant weight loss.  EXAM: Abdomen is soft without distention. Surgical wounds are well healed. No sign of recurrent herniation. No tenderness. Bowel sounds are normal. Right upper quadrant is soft and nontender without mass.  IMPRESSION: #1 status post laparoscopic cholecystectomy #2 status post primary repair acute incisional hernia #3 lethargy and weight loss and poor appetite of unknown etiology  PLAN: The patient is scheduled to see her new primary care physician on Monday. I am sure he will address the issues of lethargy and weight loss with the patient and family at that time.  Patient appears to have healed well following her cholecystectomy and incisional hernia repair. I see no complications from these procedures.  Patient will return for surgical care as needed.  Velora Hecklerodd M. Nilza Eaker, MD, FACS General & Endocrine Surgery [facility]   Visit Diagnoses: 1. Incarcerated incisional hernia

## 2013-10-04 ENCOUNTER — Emergency Department (HOSPITAL_COMMUNITY)
Admission: EM | Admit: 2013-10-04 | Discharge: 2013-10-04 | Disposition: A | Discharge status: Home / Self Care | Payer: Medicare HMO | Attending: Emergency Medicine | Admitting: Emergency Medicine

## 2013-10-04 ENCOUNTER — Encounter (HOSPITAL_COMMUNITY): Payer: Self-pay | Admitting: Emergency Medicine

## 2013-10-04 DIAGNOSIS — Z7902 Long term (current) use of antithrombotics/antiplatelets: Secondary | ICD-10-CM | POA: Insufficient documentation

## 2013-10-04 DIAGNOSIS — R5383 Other fatigue: Secondary | ICD-10-CM

## 2013-10-04 DIAGNOSIS — R5381 Other malaise: Secondary | ICD-10-CM | POA: Insufficient documentation

## 2013-10-04 DIAGNOSIS — Z79899 Other long term (current) drug therapy: Secondary | ICD-10-CM | POA: Insufficient documentation

## 2013-10-04 DIAGNOSIS — I10 Essential (primary) hypertension: Secondary | ICD-10-CM | POA: Insufficient documentation

## 2013-10-04 DIAGNOSIS — E860 Dehydration: Secondary | ICD-10-CM

## 2013-10-04 DIAGNOSIS — R630 Anorexia: Secondary | ICD-10-CM

## 2013-10-04 DIAGNOSIS — R000 Tachycardia, unspecified: Secondary | ICD-10-CM | POA: Insufficient documentation

## 2013-10-04 DIAGNOSIS — Z8719 Personal history of other diseases of the digestive system: Secondary | ICD-10-CM | POA: Insufficient documentation

## 2013-10-04 DIAGNOSIS — Z8673 Personal history of transient ischemic attack (TIA), and cerebral infarction without residual deficits: Secondary | ICD-10-CM | POA: Insufficient documentation

## 2013-10-04 DIAGNOSIS — E119 Type 2 diabetes mellitus without complications: Secondary | ICD-10-CM | POA: Insufficient documentation

## 2013-10-04 DIAGNOSIS — Z9889 Other specified postprocedural states: Secondary | ICD-10-CM | POA: Insufficient documentation

## 2013-10-04 LAB — URINALYSIS, ROUTINE W REFLEX MICROSCOPIC
GLUCOSE, UA: NEGATIVE mg/dL
Hgb urine dipstick: NEGATIVE
Nitrite: NEGATIVE
PROTEIN: NEGATIVE mg/dL
Specific Gravity, Urine: 1.029 (ref 1.005–1.030)
UROBILINOGEN UA: 1 mg/dL (ref 0.0–1.0)
pH: 5.5 (ref 5.0–8.0)

## 2013-10-04 LAB — CBC WITH DIFFERENTIAL/PLATELET
Basophils Absolute: 0 10*3/uL (ref 0.0–0.1)
Basophils Relative: 1 % (ref 0–1)
Eosinophils Absolute: 0.3 10*3/uL (ref 0.0–0.7)
Eosinophils Relative: 5 % (ref 0–5)
HCT: 32.1 % — ABNORMAL LOW (ref 36.0–46.0)
HEMOGLOBIN: 11 g/dL — AB (ref 12.0–15.0)
LYMPHS ABS: 2 10*3/uL (ref 0.7–4.0)
LYMPHS PCT: 32 % (ref 12–46)
MCH: 28.7 pg (ref 26.0–34.0)
MCHC: 34.3 g/dL (ref 30.0–36.0)
MCV: 83.8 fL (ref 78.0–100.0)
MONOS PCT: 6 % (ref 3–12)
Monocytes Absolute: 0.4 10*3/uL (ref 0.1–1.0)
NEUTROS PCT: 57 % (ref 43–77)
Neutro Abs: 3.5 10*3/uL (ref 1.7–7.7)
Platelets: 485 10*3/uL — ABNORMAL HIGH (ref 150–400)
RBC: 3.83 MIL/uL — AB (ref 3.87–5.11)
RDW: 17.3 % — ABNORMAL HIGH (ref 11.5–15.5)
WBC: 6.1 10*3/uL (ref 4.0–10.5)

## 2013-10-04 LAB — LIPASE, BLOOD: LIPASE: 24 U/L (ref 11–59)

## 2013-10-04 LAB — COMPREHENSIVE METABOLIC PANEL
ALT: 10 U/L (ref 0–35)
AST: 14 U/L (ref 0–37)
Albumin: 3.5 g/dL (ref 3.5–5.2)
Alkaline Phosphatase: 89 U/L (ref 39–117)
BILIRUBIN TOTAL: 0.6 mg/dL (ref 0.3–1.2)
BUN: 17 mg/dL (ref 6–23)
CALCIUM: 8.9 mg/dL (ref 8.4–10.5)
CHLORIDE: 100 meq/L (ref 96–112)
CO2: 21 mEq/L (ref 19–32)
CREATININE: 1.09 mg/dL (ref 0.50–1.10)
GFR calc Af Amer: 57 mL/min — ABNORMAL LOW (ref >90)
GFR, EST NON AFRICAN AMERICAN: 49 mL/min — AB (ref >90)
Glucose, Bld: 78 mg/dL (ref 70–99)
Potassium: 3.9 mEq/L (ref 3.7–5.3)
Sodium: 141 mEq/L (ref 137–147)
Total Protein: 7.5 g/dL (ref 6.0–8.3)

## 2013-10-04 LAB — URINE MICROSCOPIC-ADD ON

## 2013-10-04 LAB — CG4 I-STAT (LACTIC ACID): Lactic Acid, Venous: 1.19 mmol/L (ref 0.5–2.2)

## 2013-10-04 LAB — POCT I-STAT TROPONIN I: Troponin i, poc: 0 ng/mL (ref 0.00–0.08)

## 2013-10-04 LAB — GLUCOSE, CAPILLARY: GLUCOSE-CAPILLARY: 73 mg/dL (ref 70–99)

## 2013-10-04 MED ORDER — SODIUM CHLORIDE 0.9 % IV BOLUS (SEPSIS)
1000.0000 mL | Freq: Once | INTRAVENOUS | Status: AC
  Administered 2013-10-04: 1000 mL via INTRAVENOUS

## 2013-10-04 MED ORDER — MEGESTROL ACETATE 40 MG PO TABS: 40.0000 mg | ORAL_TABLET | Freq: Two times a day (BID) | ORAL | Status: DC

## 2013-10-04 MED ORDER — SODIUM CHLORIDE 0.9 % IV BOLUS (SEPSIS): 500.0000 mL | Freq: Once | INTRAVENOUS | Status: DC

## 2013-10-04 NOTE — ED Provider Notes (Signed)
CSN: [id number]     Arrival date & time 10/04/13  1054 History   First MD Initiated Contact with Patient 10/04/13 1112     Chief Complaint  Patient presents with  . Abdominal Pain   (Consider location/radiation/quality/duration/timing/severity/associated sxs/prior Treatment) HPI Comments: Patient accompanied by children. They state progressive weakness and decreased PO intake. Patient has decreased appetite s/p her recent surgeries. Patient also states occasional abdominal pain, burning at epigastrum, no radiation. No alleviating/exacerbating factors. No nausea or vomiting, no diarrhea.   Patient is a 73 y.o. female presenting with abdominal pain. The history is provided by the patient.  Abdominal Pain Pain location:  Epigastric Pain quality: burning   Pain radiates to:  Does not radiate Pain severity:  Mild Onset quality:  Gradual Duration:  3 weeks Timing:  Sporadic (not daily) Progression:  Unchanged Chronicity:  New Context: previous surgery (recent cholecystectomy and hernia repair)   Context: not trauma   Relieved by:  Nothing Worsened by:  Nothing tried Associated symptoms: no chest pain, no chills, no cough, no fever, no nausea, no shortness of breath and no vomiting     Past Medical History  Diagnosis Date  . Diabetes mellitus without complication   . Hypertension   . Stroke   . Acute cholecystitis 08/10/2013    Lap chole on 08/13/13    Past Surgical History  Procedure Laterality Date  . Ercp N/A 08/12/2013    Procedure: ENDOSCOPIC RETROGRADE CHOLANGIOPANCREATOGRAPHY (ERCP);  Surgeon: Theda Belfast, MD;  Location: [facility] ENDOSCOPY;  Service: Endoscopy;  Laterality: N/A;  . Sphincterotomy  08/12/2013    Procedure: SPHINCTEROTOMY;  Surgeon: Theda Belfast, MD;  Location: [facility] ENDOSCOPY;  Service: Endoscopy;;  . Cholecystectomy N/A 08/13/2013    Procedure: LAPAROSCOPIC CHOLECYSTECTOMY ;  Surgeon: Currie Paris, MD;  Location: [facility] OR;  Service: General;  Laterality: N/A;   . Incisional hernia repair N/A 08/20/2013    Procedure: HERNIA REPAIR INCISIONAL UMBILICAL;  Surgeon: Velora Heckler, MD;  Location: [facility] OR;  Service: General;  Laterality: N/A;   No family history on file. History  Substance Use Topics  . Smoking status: Never Smoker   . Smokeless tobacco: Not on file  . Alcohol Use: No   OB History   Grav Para Term Preterm Abortions TAB SAB Ect Mult Living                 Review of Systems  Constitutional: Negative for fever and chills.  Respiratory: Negative for cough and shortness of breath.   Cardiovascular: Negative for chest pain and leg swelling.  Gastrointestinal: Positive for abdominal pain. Negative for nausea and vomiting.  Neurological: Positive for weakness (generalized).  All other systems reviewed and are negative.    Allergies  Review of patient's allergies indicates no known allergies.  Home Medications   Current Outpatient Rx  Name  Route  Sig  Dispense  Refill  . acetaminophen (TYLENOL) 325 MG tablet   Oral   Take 650 mg by mouth every 6 (six) hours as needed for pain.         Marland Kitchen AMITIZA 8 MCG capsule               . buPROPion (WELLBUTRIN XL) 150 MG 24 hr tablet   Oral   Take 150 mg by mouth daily.         . clopidogrel (PLAVIX) 75 MG tablet   Oral   Take 75 mg by mouth every evening.         Marland Kitchen  hydrOXYzine (VISTARIL) 25 MG capsule               . loratadine (CLARITIN) 10 MG tablet   Oral   Take 10 mg by mouth daily.         . metFORMIN (GLUCOPHAGE) 500 MG tablet   Oral   Take 1,000 mg by mouth 2 (two) times daily with a meal.          . PARoxetine (PAXIL) 40 MG tablet   Oral   Take 40 mg by mouth every morning.         Marland Kitchen. spironolactone (ALDACTONE) 25 MG tablet   Oral   Take 25 mg by mouth 2 (two) times daily.          BP 120/76  Pulse 113  Temp(Src) 97.5 F (36.4 C) (Oral)  Resp 18  Wt 177 lb (80.287 kg)  SpO2 98% Physical Exam  Nursing note and vitals  reviewed. Constitutional: She is oriented to person, place, and time. She appears well-developed and well-nourished. No distress.  HENT:  Head: Normocephalic and atraumatic.  Nose: Nose normal.  Mouth/Throat: Mucous membranes are dry (moderate).  Eyes: EOM are normal. Pupils are equal, round, and reactive to light.  Neck: Normal range of motion. Neck supple.  Cardiovascular: Regular rhythm.  Tachycardia present.  Exam reveals no friction rub.   No murmur heard. Pulmonary/Chest: Effort normal and breath sounds normal. No respiratory distress. She has no wheezes. She has no rales.  Abdominal: Soft. She exhibits no distension. There is no tenderness. There is no rebound.  Musculoskeletal: Normal range of motion. She exhibits no edema.  Neurological: She is alert and oriented to person, place, and time. No cranial nerve deficit. She exhibits normal muscle tone.  Skin: No rash noted. She is not diaphoretic.    ED Course  Procedures (including critical care time) Labs Review Labs Reviewed  GLUCOSE, CAPILLARY  URINALYSIS, ROUTINE W REFLEX MICROSCOPIC  LIPASE, BLOOD  COMPREHENSIVE METABOLIC PANEL  CBC WITH DIFFERENTIAL   Imaging Review No results found.  EKG Interpretation    Date/Time:  Saturday October 04 2013 11:01:48 EST Ventricular Rate:  114 PR Interval:  158 QRS Duration: 76 QT Interval:  332 QTC Calculation: 457 R Axis:   34 Text Interpretation:  Sinus tachycardia T wave flattening on aVF - new T wave inversion in III - new [facility] by [facility]  MD, Katia Hannen ([id number]) on 10/04/2013 11:25:22 AM            MDM   1. Dehydration   2. Decreased appetite    [age] year female here with weakness, decreased appetite, sporadic burning epigastric pain. This all began in the wake of her 2 recent surgeries. She has surgery followup a few days ago and was normal at that time. She is followup in 2 days with her new primary care physician. Here she is tachycardic in the 110s, normotensive.  Her mucous membranes appear very dry. I am concerned she is dehydrated from decreased by mouth intake. We'll check labs. She denies any abdominal pain Labs show mild dehydration. Given fluids here, she is tolerating PO well. She is stable for discharge and does not need acute hospitalization for rehydration at this time. She has f/u with her PCP in 2 days. Will trial megace as an appetite stimulant for a few days, can re-assess with PCP.  Urine with small leukocytes but many squamous cells, likely contaminated. No need for antibiotics at this point.   Tracy [name]  Gwendolyn Grant, MD 10/04/13 [phone]

## 2013-10-04 NOTE — ED Notes (Signed)
PT Family reports that since the recent surgeries Pt has felt bad. Pt has had post op constipation and poor appetite . [facility] which is better now but still has poor appetite .

## 2013-10-04 NOTE — ED Notes (Signed)
Pt had chole and hernia repair in December.  Pt here with burning sensation to upper abdomen for one week, weakness, and no appetite.  Denies sob or chest pain.  Reports blood sugars over 100.

## 2013-10-04 NOTE — ED Notes (Signed)
Lab results given to EDP. 

## 2013-10-04 NOTE — Discharge Instructions (Signed)
Dehydration, Elderly °Dehydration is when you lose more fluids from the body than you take in. Vital organs such as the kidneys, brain, and heart cannot function without a proper amount of fluids and salt. Any loss of fluids from the body can cause dehydration.  °Older adults are at a higher risk of dehydration than younger adults. As we age, our bodies are less able to conserve water and do not respond to temperature changes as well. Also, older adults do not become thirsty as easily or quickly. Because of this, older adults often do not realize they need to increase fluids to avoid dehydration.  °CAUSES  °· Vomiting. °· Diarrhea. °· Excessive sweating. °· Excessive urination. °· Fever. °· Certain medicines, such as blood pressure medicines called diuretics. °· Poorly controlled blood sugars. °SIGNS AND SYMPTOMS  °Mild dehydration: °· Thirst. °· Dry lips. °· Slightly dry mouth. °Moderate dehydration: °· Very dry mouth. °· Sunken eyes. °· Skin does not bounce back quickly when lightly pinched and released. °· Dark urine and decreased urine production. °· Decreased tear production. °· Headache. °Severe dehydration: °· Very dry mouth. °· Extreme thirst. °· Rapid, weak pulse (more than 100 beats per minute at rest). °· Cold hands and feet. °· Not able to sweat in spite of heat. °· Rapid breathing. °· Blue lips. °· Confusion and lethargy. °· Difficulty being awakened. °· Minimal urine production. °· No tears. °DIAGNOSIS  °Your health care provider will diagnose dehydration based on your symptoms and your exam. Blood and urine tests will help confirm the diagnosis. The diagnostic evaluation should also identify the cause of dehydration. °TREATMENT  °Treatment of mild or moderate dehydration can often be done at home by increasing the amount of fluids that you drink. It is best to drink small amounts of fluid more often. Drinking too much at one time can make vomiting worse. Severe dehydration needs to be treated at the  hospital. You may be given IV fluids that contain water and electrolytes. °HOME CARE INSTRUCTIONS  °· Ask your health care provider about specific rehydration instructions. °· Drink enough fluids to keep your urine clear or pale yellow. °· Drink small amounts frequently if you have nausea and vomiting. °· Eat as you normally do. °· Avoid: °· Foods or drinks high in sugar. °· Carbonated drinks. °· Juice. °· Extremely hot or cold fluids. °· Drinks with caffeine. °· Fatty, greasy foods. °· Alcohol. °· Tobacco. °· Overeating. °· Gelatin desserts. °· Wash your hands well to avoid spreading bacteria and viruses. °· Only take over-the-counter or prescription medicines for pain, discomfort, or fever as directed by your health care provider. °· Ask your health care provider if you should continue all prescribed and over-the-counter medicines. °· Keep all follow-up appointments with your health care provider. °SEEK MEDICAL CARE IF: °· You have abdominal pain, and it increases or stays in one area (localizes). °· You have a rash, stiff neck, or severe headache. °· You are irritable, sleepy, or difficult to awaken. °· You are weak, dizzy, or extremely thirsty. °SEEK IMMEDIATE MEDICAL CARE IF:  °· You are unable to keep fluids down, or you get worse despite treatment. °· You have frequent episodes of vomiting or diarrhea. °· You have blood or green matter (bile) in your vomit. °· You have blood in your stool, or your stool looks black and tarry. °· You have not urinated in 6 8 hours, or you have only urinated a small amount of very dark urine. °· You have a   fever. °· You faint. °MAKE SURE YOU:  °· Understand these instructions. °· Will watch your condition. °· Will get help right away if you are not doing well or get worse. °Document Released: 11/18/2003 Document Revised: 06/18/2013 Document Reviewed: 05/05/2013 °[employer]® Patient Information ©2014 [employer]. ° °

## 2013-10-06 ENCOUNTER — Ambulatory Visit (INDEPENDENT_AMBULATORY_CARE_PROVIDER_SITE_OTHER): Payer: Medicare HMO | Admitting: Internal Medicine

## 2013-10-06 ENCOUNTER — Encounter: Payer: Self-pay | Admitting: Internal Medicine

## 2013-10-06 VITALS — BP 130/80 | HR 88 | Temp 97.6°F | Resp 16 | Ht 65.0 in | Wt 180.0 lb

## 2013-10-06 DIAGNOSIS — I10 Essential (primary) hypertension: Secondary | ICD-10-CM

## 2013-10-06 DIAGNOSIS — R634 Abnormal weight loss: Secondary | ICD-10-CM

## 2013-10-06 DIAGNOSIS — G8190 Hemiplegia, unspecified affecting unspecified side: Secondary | ICD-10-CM

## 2013-10-06 DIAGNOSIS — G8191 Hemiplegia, unspecified affecting right dominant side: Secondary | ICD-10-CM

## 2013-10-06 DIAGNOSIS — K5909 Other constipation: Secondary | ICD-10-CM

## 2013-10-06 DIAGNOSIS — K5900 Constipation, unspecified: Secondary | ICD-10-CM

## 2013-10-06 DIAGNOSIS — Z8673 Personal history of transient ischemic attack (TIA), and cerebral infarction without residual deficits: Secondary | ICD-10-CM

## 2013-10-06 DIAGNOSIS — E119 Type 2 diabetes mellitus without complications: Secondary | ICD-10-CM

## 2013-10-06 MED ORDER — VITAMIN D 1000 UNITS PO TABS: 1000.0000 [IU] | ORAL_TABLET | Freq: Every day | ORAL | Status: AC

## 2013-10-06 MED ORDER — PAROXETINE HCL 40 MG PO TABS: 20.0000 mg | ORAL_TABLET | ORAL | Status: DC

## 2013-10-06 MED ORDER — CIPROFLOXACIN HCL 250 MG PO TABS: 250.0000 mg | ORAL_TABLET | Freq: Two times a day (BID) | ORAL | Status: DC

## 2013-10-06 NOTE — Assessment & Plan Note (Signed)
Continue with current prescription therapy as reflected on the Med list.  

## 2013-10-06 NOTE — Progress Notes (Signed)
Subjective:    Patient ID: Tracy [name], female    DOB: 07-May-1941, 73 y.o.   MRN: [id number]  HPI  New pt C/o wt loss, nausea x 2 months, no appetite; not better We need to address her CVA, weakness, HTN  Review of Systems  Constitutional: Positive for fatigue. Negative for fever, chills, diaphoresis, activity change, appetite change and unexpected weight change.  HENT: Negative for congestion, dental problem, ear pain, hearing loss, mouth sores, postnasal drip, sinus pressure, sneezing, sore throat and voice change.   Eyes: Negative for pain and visual disturbance.  Respiratory: Negative for cough, chest tightness, shortness of breath, wheezing and stridor.   Cardiovascular: Negative for chest pain, palpitations and leg swelling.  Gastrointestinal: Positive for nausea and constipation. Negative for vomiting, abdominal pain, diarrhea, blood in stool, abdominal distention and rectal pain.  Genitourinary: Negative for dysuria, hematuria, flank pain, decreased urine volume, vaginal bleeding, vaginal discharge, difficulty urinating, vaginal pain and menstrual problem.  Musculoskeletal: Negative for back pain, gait problem, joint swelling and neck pain.  Skin: Negative for color change, rash and wound.  Neurological: Negative for dizziness, tremors, syncope, speech difficulty and light-headedness.  Hematological: Negative for adenopathy.  Psychiatric/Behavioral: Negative for suicidal ideas, hallucinations, behavioral problems, confusion, sleep disturbance, dysphoric mood and decreased concentration. The patient is nervous/anxious. The patient is not hyperactive.     Wt Readings from Last 3 Encounters:  10/06/13 180 lb (81.647 kg)  10/04/13 177 lb (80.287 kg)  10/03/13 177 lb (80.287 kg)   BP Readings from Last 3 Encounters:  10/06/13 130/80  10/04/13 121/68  10/03/13 128/68       Objective:   Physical Exam  Constitutional: She appears well-developed. No distress.  HENT:    Head: Normocephalic.  Right Ear: External ear normal.  Left Ear: External ear normal.  Nose: Nose normal.  Mouth/Throat: Oropharynx is clear and moist.  Eyes: Conjunctivae are normal. Pupils are equal, round, and reactive to light. Right eye exhibits no discharge. Left eye exhibits no discharge.  Neck: Normal range of motion. Neck supple. No JVD present. No tracheal deviation present. No thyromegaly present.  Cardiovascular: Normal rate, regular rhythm and normal heart sounds.   Pulmonary/Chest: No stridor. No respiratory distress. She has no wheezes.  Abdominal: Soft. Bowel sounds are normal. She exhibits no distension and no mass. There is no tenderness. There is no rebound and no guarding.  Musculoskeletal: She exhibits no edema and no tenderness.  Lymphadenopathy:    She has no cervical adenopathy.  Neurological: She displays normal reflexes. A cranial nerve deficit is present. She exhibits normal muscle tone. Coordination normal.  R facial droop  weakness R side  Skin: No rash noted. No erythema.  Psychiatric: She has a normal mood and affect. Her behavior is normal. Judgment and thought content normal.    Lab Results  Component Value Date   WBC 6.1 10/04/2013   HGB 11.0* 10/04/2013   HCT 32.1* 10/04/2013   PLT 485* 10/04/2013   GLUCOSE 78 10/04/2013   CHOL  Value: 157        ATP III CLASSIFICATION:  <200     mg/dL   Desirable  409-811  mg/dL   Borderline High  >=914    mg/dL   High 7/82/9562   TRIG 61 01/24/2008   HDL 41 01/24/2008   LDLCALC  Value: 104        Total Cholesterol/HDL:CHD Risk Coronary Heart Disease Risk Table  Men   Women  1/2 Average Risk   3.4   3.3* 01/24/2008   ALT 10 10/04/2013   AST 14 10/04/2013   NA 141 10/04/2013   K 3.9 10/04/2013   CL 100 10/04/2013   CREATININE 1.09 10/04/2013   BUN 17 10/04/2013   CO2 21 10/04/2013   TSH  01/24/2008   INR 1.0 12/23/2008   HGBA1C 6.4* 08/11/2013         Assessment & Plan:

## 2013-10-06 NOTE — Assessment & Plan Note (Signed)
D/c Wellbutrin Reduce Paxil, Hydroxyzine

## 2013-10-06 NOTE — Assessment & Plan Note (Signed)
Remote - R hemiparesis Continue with current prescription therapy as reflected on the Med list.

## 2013-10-06 NOTE — Patient Instructions (Signed)
Stop Bupropion and Hydroxyzine Paroxetine 40 mg 1/2 daily

## 2013-10-06 NOTE — Progress Notes (Signed)
Pre visit review using our clinic review tool, if applicable. No additional management support is needed unless otherwise documented below in the visit note. 

## 2013-10-09 ENCOUNTER — Telehealth: Payer: Self-pay

## 2013-10-09 NOTE — Telephone Encounter (Signed)
Relevant patient education mailed to patient.  

## 2013-11-03 ENCOUNTER — Encounter: Payer: Self-pay | Admitting: Internal Medicine

## 2013-11-03 ENCOUNTER — Ambulatory Visit (INDEPENDENT_AMBULATORY_CARE_PROVIDER_SITE_OTHER): Payer: Medicare HMO | Admitting: Internal Medicine

## 2013-11-03 VITALS — BP 154/90 | HR 80 | Temp 97.6°F | Resp 16 | Wt 184.0 lb

## 2013-11-03 DIAGNOSIS — R634 Abnormal weight loss: Secondary | ICD-10-CM

## 2013-11-03 DIAGNOSIS — K5900 Constipation, unspecified: Secondary | ICD-10-CM

## 2013-11-03 DIAGNOSIS — G8190 Hemiplegia, unspecified affecting unspecified side: Secondary | ICD-10-CM

## 2013-11-03 DIAGNOSIS — K5909 Other constipation: Secondary | ICD-10-CM

## 2013-11-03 DIAGNOSIS — E119 Type 2 diabetes mellitus without complications: Secondary | ICD-10-CM

## 2013-11-03 DIAGNOSIS — G8191 Hemiplegia, unspecified affecting right dominant side: Secondary | ICD-10-CM

## 2013-11-03 DIAGNOSIS — I10 Essential (primary) hypertension: Secondary | ICD-10-CM

## 2013-11-03 MED ORDER — PAROXETINE HCL 10 MG PO TABS
10.0000 mg | ORAL_TABLET | Freq: Every day | ORAL | Status: DC
Start: 2013-11-03 — End: 2013-12-24

## 2013-11-03 NOTE — Assessment & Plan Note (Signed)
Better  

## 2013-11-03 NOTE — Assessment & Plan Note (Signed)
Labs

## 2013-11-03 NOTE — Assessment & Plan Note (Signed)
Better off Welbutrin

## 2013-11-03 NOTE — Progress Notes (Signed)
Pre visit review using our clinic review tool, if applicable. No additional management support is needed unless otherwise documented below in the visit note. 

## 2013-11-03 NOTE — Assessment & Plan Note (Signed)
No change but she is stronger in general

## 2013-11-03 NOTE — Progress Notes (Signed)
Patient ID: Tracy [name], female   DOB: 10/12/1940, 73 y.o.   MRN: [id number]   Subjective:    Patient ID: Tracy PaganiniYvonne D [name], female    DOB: 11/16/1940, 73 y.o.   MRN: [id number]  HPI   F/u wt loss, nausea x 2 months, no appetite -much better F/u on her CVA, weakness, HTN  Review of Systems  Constitutional: Positive for fatigue. Negative for fever, chills, diaphoresis, activity change, appetite change and unexpected weight change.  HENT: Negative for congestion, dental problem, ear pain, hearing loss, mouth sores, postnasal drip, sinus pressure, sneezing, sore throat and voice change.   Eyes: Negative for pain and visual disturbance.  Respiratory: Negative for cough, chest tightness, shortness of breath, wheezing and stridor.   Cardiovascular: Negative for chest pain, palpitations and leg swelling.  Gastrointestinal: Positive for nausea and constipation. Negative for vomiting, abdominal pain, diarrhea, blood in stool, abdominal distention and rectal pain.  Genitourinary: Negative for dysuria, hematuria, flank pain, decreased urine volume, vaginal bleeding, vaginal discharge, difficulty urinating, vaginal pain and menstrual problem.  Musculoskeletal: Negative for back pain, gait problem, joint swelling and neck pain.  Skin: Negative for color change, rash and wound.  Neurological: Negative for dizziness, tremors, syncope, speech difficulty and light-headedness.  Hematological: Negative for adenopathy.  Psychiatric/Behavioral: Negative for suicidal ideas, hallucinations, behavioral problems, confusion, sleep disturbance, dysphoric mood and decreased concentration. The patient is nervous/anxious. The patient is not hyperactive.     Wt Readings from Last 3 Encounters:  11/03/13 184 lb (83.462 kg)  10/06/13 180 lb (81.647 kg)  10/04/13 177 lb (80.287 kg)   BP Readings from Last 3 Encounters:  11/03/13 154/90  10/06/13 130/80  10/04/13 121/68       Objective:   Physical Exam    Constitutional: She appears well-developed. No distress.  HENT:  Head: Normocephalic.  Right Ear: External ear normal.  Left Ear: External ear normal.  Nose: Nose normal.  Mouth/Throat: Oropharynx is clear and moist.  Eyes: Conjunctivae are normal. Pupils are equal, round, and reactive to light. Right eye exhibits no discharge. Left eye exhibits no discharge.  Neck: Normal range of motion. Neck supple. No JVD present. No tracheal deviation present. No thyromegaly present.  Cardiovascular: Normal rate, regular rhythm and normal heart sounds.   Pulmonary/Chest: No stridor. No respiratory distress. She has no wheezes.  Abdominal: Soft. Bowel sounds are normal. She exhibits no distension and no mass. There is no tenderness. There is no rebound and no guarding.  Musculoskeletal: She exhibits no edema and no tenderness.  Lymphadenopathy:    She has no cervical adenopathy.  Neurological: She displays normal reflexes. A cranial nerve deficit is present. She exhibits normal muscle tone. Coordination normal.  R facial droop  weakness R side  Skin: No rash noted. No erythema.  Psychiatric: She has a normal mood and affect. Her behavior is normal. Judgment and thought content normal.    Lab Results  Component Value Date   WBC 6.1 10/04/2013   HGB 11.0* 10/04/2013   HCT 32.1* 10/04/2013   PLT 485* 10/04/2013   GLUCOSE 78 10/04/2013   CHOL  Value: 157        ATP III CLASSIFICATION:  <200     mg/dL   Desirable  629-528200-239  mg/dL   Borderline High  >=413>=240    mg/dL   High 2/44/01025/15/2009   TRIG 61 01/24/2008   HDL 41 01/24/2008   LDLCALC  Value: 104        Total Cholesterol/HDL:CHD  Risk Coronary Heart Disease Risk Table                     Men   Women  1/2 Average Risk   3.4   3.3* 01/24/2008   ALT 10 10/04/2013   AST 14 10/04/2013   NA 141 10/04/2013   K 3.9 10/04/2013   CL 100 10/04/2013   CREATININE 1.09 10/04/2013   BUN 17 10/04/2013   CO2 21 10/04/2013   TSH  01/24/2008   INR 1.0 12/23/2008   HGBA1C 6.4*  08/11/2013         Assessment & Plan:

## 2013-11-03 NOTE — Assessment & Plan Note (Signed)
Continue with current prescription therapy as reflected on the Med list.  

## 2013-12-17 ENCOUNTER — Telehealth: Payer: Self-pay | Admitting: Internal Medicine

## 2013-12-17 NOTE — Telephone Encounter (Signed)
Patient is calling back about this. Says that she has ringing in her ears..Tracy [name]

## 2013-12-17 NOTE — Telephone Encounter (Signed)
Patient is calling in regards to her PARoxetine (PAXIL) 10 MG tablet. States "I don't like the way it makes me feel." Please advise.

## 2013-12-18 NOTE — Telephone Encounter (Signed)
Please advise 

## 2013-12-18 NOTE — Telephone Encounter (Signed)
Take 1/2 tablet at hs. OK to stop in 1 week if problems Thx

## 2013-12-19 NOTE — Telephone Encounter (Signed)
Discussed with pt

## 2013-12-22 ENCOUNTER — Ambulatory Visit: Payer: Medicare HMO | Admitting: Internal Medicine

## 2013-12-24 ENCOUNTER — Ambulatory Visit (INDEPENDENT_AMBULATORY_CARE_PROVIDER_SITE_OTHER): Payer: Commercial Managed Care - HMO | Admitting: Internal Medicine

## 2013-12-24 ENCOUNTER — Encounter: Payer: Self-pay | Admitting: Internal Medicine

## 2013-12-24 VITALS — BP 138/80 | HR 88 | Temp 97.1°F | Wt 178.8 lb

## 2013-12-24 DIAGNOSIS — H6121 Impacted cerumen, right ear: Secondary | ICD-10-CM

## 2013-12-24 DIAGNOSIS — H6120 Impacted cerumen, unspecified ear: Secondary | ICD-10-CM

## 2013-12-24 MED ORDER — PAROXETINE HCL 10 MG PO TABS
10.0000 mg | ORAL_TABLET | Freq: Every day | ORAL | Status: DC
Start: 2013-12-24 — End: 2014-06-02

## 2013-12-24 NOTE — Patient Instructions (Signed)
Please do not use Q-tips as we discussed. Should wax build up occur, please put 2-3 drops of mineral oil in the affected  ear at night to soften the wax .Cover the canal with a  cotton ball to prevent the oil from staining bed linens. In the morning fill the ear canal with hydrogen peroxide & lie in the opposite lateral decubitus position(on the side opposite the affected ear)  for 10-15 minutes. After allowing this period of time for the peroxide to dissolve the wax ;shower and use the thinnest washrag available to wick out the wax. If both ears are involved ; alternate this treatment from ear to ear each night until no wax is found on the washrag. 

## 2013-12-24 NOTE — Progress Notes (Signed)
   Subjective:    Patient ID: Tracy [name], female    DOB: 04/25/1941, 73 y.o.   MRN: [id number]  HPI She presents today with complaints of ear discomfort which began 2 weeks ago as a "heaviness" in her ear.  On Friday, she cleaned her ear with a q-tip and was unsure if it was blood or dark wax on the swab.  No other drainage from ear since that time; ear discomfort increaesed after Q-tip use.  She reports occasional frontal headaches; denies any other acute issues at this time.   She also inquires about a refill for Paxil. Her son reports the paper prescription was lost.    Review of Systems Denies fever, chills, sweats, discolored nasal drainage, cough, sore throat, chest sputum.     Objective:   Physical Exam General appearance:good health ;well nourished; no acute distress or increased work of breathing is present.  No  lymphadenopathy about the head, neck, or axilla noted.  Eyes: No conjunctival inflammation or lid edema is present. There is no scleral icterus. Esotropia, no pain with movement.  Ears:  External ear exam shows no significant lesions or deformities. Dark cerumen noted in R ear; dull light reflux bilaterally; TM intact, dull LR bilaterally; no bulging, retraction, inflammation or discharge. Nose:  External nasal examination shows bilateral nasal polyps. Nasal mucosa are pink and moist without lesions or exudates. No septal dislocation or deviation. Mild obstruction to airflow.  Oral exam: missing dentition; lips and gums are healthy appearing.There is no oropharyngeal erythema or exudate noted.  Neck:  No deformities, thyromegaly, masses, or tenderness noted.   Supple with full range of motion without pain.  Lymph: no cervical, preauricular, postauricular, axilla lymphadenopathy.  Heart:  Normal rate and regular rhythm. S1 and S2 normal without gallop, murmur, click, rub or other extra sounds.  Lungs:Chest clear to auscultation; no wheezes, rhonchi,rales ,or rubs  present.No increased work of breathing.   Extremities:  No cyanosis, edema, or clubbing  noted  Skin: Warm & dry w/o jaundice or tenting.    Assessment & Plan:  #1 R ear pain - in context of recent Q-tip use; advised to use mineral oil/H202 for cleaning and avoid Q-tip.

## 2013-12-24 NOTE — Progress Notes (Signed)
Pre visit review using our clinic review tool, if applicable. No additional management support is needed unless otherwise documented below in the visit note. 

## 2013-12-24 NOTE — Progress Notes (Signed)
   Subjective:    Patient ID: Tracy [name], female    DOB: 08/31/1941, 73 y.o.   MRN: [id number]  HPI She presents today with complaints of ear discomfort which began 2 weeks ago as a "heaviness" in her ear.  On Friday, she cleaned her ear with a q-tip and was unsure if it was blood or dark wax on the swab.  No other drainage from ear since that time; ear discomfort increaesed after Q-tip use.  She reports occasional frontal headaches; denies any other acute issues at this time.  She also inquires about a refill for Paxil. Her son reports the paper prescription was lost. It has been effective      Review of Systems Denies fever, chills, sweats, discolored nasal drainage, cough, sore throat, chest sputum.       Objective:   Physical Exam General appearance:good health ;well nourished; no acute distress or increased work of breathing is present. No lymphadenopathy about the head, neck, or axilla noted.  Eyes: No conjunctival inflammation or lid edema is present. There is no scleral icterus. OS esotropia, no pain with movement.  Ears: External ear exam shows no significant lesions or deformities. Dark cerumen noted in R ear; dull light reflux bilaterally; TM intact, dull LR bilaterally; no bulging, retraction, inflammation or discharge.  Nose: External nasal examination shows bilateral nasal polyps. Nasal mucosa are pink and moist without lesions or exudates. No septal dislocation or deviation. Mild obstruction to airflow.  Oral exam: missing dentition; lips and gums are healthy appearing.There is no oropharyngeal erythema or exudate noted.  Neck: No deformities, thyromegaly, masses, or tenderness noted. Supple with full range of motion without pain.  Lymph: no cervical, preauricular, postauricular, axilla lymphadenopathy.  Heart: Normal rate and regular rhythm. S1 and S2 normal without gallop, murmur, click, rub or other extra sounds.  Lungs:Chest clear to auscultation; no wheezes,  rhonchi,rales ,or rubs present.No increased work of breathing.  Extremities: No cyanosis, edema, or clubbing noted  Skin: Warm & dry w/o jaundice or tenting.      Assessment & Plan:  #1 R ear pain - in context of recent Q-tip use; advised to use mineral oil/H202 for cleaning and avoid Q-tip.  #2 SSRI therapy @ low dose effective; Rx refilled

## 2014-01-19 ENCOUNTER — Other Ambulatory Visit: Payer: Self-pay | Admitting: Internal Medicine

## 2014-01-20 ENCOUNTER — Ambulatory Visit (INDEPENDENT_AMBULATORY_CARE_PROVIDER_SITE_OTHER): Payer: Commercial Managed Care - HMO | Admitting: Internal Medicine

## 2014-01-20 ENCOUNTER — Encounter: Payer: Self-pay | Admitting: Internal Medicine

## 2014-01-20 ENCOUNTER — Ambulatory Visit (INDEPENDENT_AMBULATORY_CARE_PROVIDER_SITE_OTHER): Payer: Commercial Managed Care - HMO

## 2014-01-20 VITALS — BP 128/78 | HR 110 | Temp 98.0°F | Wt 177.6 lb

## 2014-01-20 DIAGNOSIS — F0390 Unspecified dementia without behavioral disturbance: Secondary | ICD-10-CM

## 2014-01-20 DIAGNOSIS — R1032 Left lower quadrant pain: Secondary | ICD-10-CM

## 2014-01-20 DIAGNOSIS — R1900 Intra-abdominal and pelvic swelling, mass and lump, unspecified site: Secondary | ICD-10-CM

## 2014-01-20 LAB — CBC WITH DIFFERENTIAL/PLATELET
BASOS ABS: 0 10*3/uL (ref 0.0–0.1)
Basophils Relative: 0.7 % (ref 0.0–3.0)
EOS PCT: 3.9 % (ref 0.0–5.0)
Eosinophils Absolute: 0.3 10*3/uL (ref 0.0–0.7)
HCT: 35.6 % — ABNORMAL LOW (ref 36.0–46.0)
Hemoglobin: 11.8 g/dL — ABNORMAL LOW (ref 12.0–15.0)
Lymphocytes Relative: 46.2 % — ABNORMAL HIGH (ref 12.0–46.0)
Lymphs Abs: 3.2 10*3/uL (ref 0.7–4.0)
MCHC: 33.1 g/dL (ref 30.0–36.0)
MCV: 89.6 fl (ref 78.0–100.0)
Monocytes Absolute: 0.3 10*3/uL (ref 0.1–1.0)
Monocytes Relative: 4 % (ref 3.0–12.0)
NEUTROS PCT: 45.2 % (ref 43.0–77.0)
Neutro Abs: 3.1 10*3/uL (ref 1.4–7.7)
PLATELETS: 375 10*3/uL (ref 150.0–400.0)
RBC: 3.98 Mil/uL (ref 3.87–5.11)
RDW: 17.3 % — ABNORMAL HIGH (ref 11.5–15.5)
WBC: 6.9 10*3/uL (ref 4.0–10.5)

## 2014-01-20 LAB — BASIC METABOLIC PANEL
BUN: 16 mg/dL (ref 6–23)
CHLORIDE: 105 meq/L (ref 96–112)
CO2: 28 mEq/L (ref 19–32)
CREATININE: 0.9 mg/dL (ref 0.4–1.2)
Calcium: 9.6 mg/dL (ref 8.4–10.5)
GFR: 77.06 mL/min (ref >60.00)
Glucose, Bld: 87 mg/dL (ref 70–99)
Potassium: 4 mEq/L (ref 3.5–5.1)
Sodium: 142 mEq/L (ref 135–145)

## 2014-01-20 NOTE — Progress Notes (Signed)
   Subjective:    Patient ID: Tracy [name], female    DOB: 02/12/1941, 73 y.o.   MRN: [id number]  HPI  She presents with lower quadrant dull abdominal pain, greater on the left toward the anterior hip area. This is been present for 2 weeks intermittently  & lasting 60 minutes or less  She has chronic constipation; but her last 3 BMs have been "normal".   Past history includes cholecystectomy in December 2014 as well as sphincterectomy. She denies any history of gynecologic issues but she has had  7 children  She did experience weight loss following the cholecystectomy in the context of anorexia but has regained some weight    Review of Systems  She has no significant dyspepsia, dysphagia, melena, rectal bleeding, or persistently small caliber stools.  She denies dysuria, pyuria, hematuria, frequency, nocturia or polyuria.  She denies any vaginal discharge or bleeding.     Objective:   Physical Exam General appearance is one of good health and nourishment w/o distress. Appears younger than stated age Eyes: No conjunctival inflammation or scleral icterus is present.Resting esotropia the of the left eye  Oral exam: Dental hygiene is extremely poor. She has severe plaque and severe caries diffusely. Lips and gums are healthy appearing.There is no oropharyngeal erythema or exudate noted.   Heart:  Normal rate and regular rhythm. S1 and S2 normal without gallop, murmur, click, or rub.An S4 is noted with some slurring   Lungs:Chest clear to auscultation; no wheezes, rhonchi,rales ,or rubs present.No increased work of breathing.   Abdomen: bowel sounds normal, soft and non-tender without  organomegaly or hernia.    She has a palpable, non tender 7.5 x 6 cm periumbilical mass  No guarding or rebound . No tenderness over the flanks to percussion  Musculoskeletal: Able to lie flat and sit up without help. Negative straight leg raising bilaterally. Gait normal  Skin:Warm & dry.   Intact without suspicious lesions or rashes ; no jaundice or tenting  Lymphatic: No lymphadenopathy is noted about the head, neck, axilla   She is pleasantly demented; she stated the date was [id number]. She was unable to give me her son's birthday. She was unable to identify the present and initially stated he was "pink" when I asked as to his race               Assessment & Plan:  #1 left lower quadrant abdominal pain  #2 periumbilical mass  #3 dementia which places her history into question  See orders

## 2014-01-20 NOTE — Patient Instructions (Signed)
Your next office appointment will be determined based upon review of your pending labs & abdominal ultrasound. Those instructions will be transmitted to you through My Chart  OR  by mail;whichever process is your choice to receive results & recommendations .  Followup as needed for your acute issue. Please report any significant change in your symptoms.

## 2014-01-20 NOTE — Progress Notes (Signed)
Pre visit review using our clinic review tool, if applicable. No additional management support is needed unless otherwise documented below in the visit note. 

## 2014-01-21 ENCOUNTER — Telehealth: Payer: Self-pay

## 2014-01-21 ENCOUNTER — Ambulatory Visit: Payer: Commercial Managed Care - HMO

## 2014-01-21 DIAGNOSIS — R1032 Left lower quadrant pain: Secondary | ICD-10-CM

## 2014-01-21 LAB — URINALYSIS, ROUTINE W REFLEX MICROSCOPIC
Bilirubin Urine: NEGATIVE
HGB URINE DIPSTICK: NEGATIVE
Ketones, ur: NEGATIVE
NITRITE: NEGATIVE
RBC / HPF: NONE SEEN (ref 0–?)
Specific Gravity, Urine: 1.015 (ref 1.000–1.030)
Total Protein, Urine: NEGATIVE
UROBILINOGEN UA: 0.2 (ref 0.0–1.0)
Urine Glucose: NEGATIVE
pH: 5.5 (ref 5.0–8.0)

## 2014-01-21 NOTE — Telephone Encounter (Signed)
Message copied by Noreene LarssonANDREWS, Royce Sciara R on Wed Jan 21, 2014 11:42 AM ------      Message from: Pecola LawlessHOPPER, WILLIAM F      Created: Wed Jan 21, 2014 11:32 AM       Culture urine if possible ------

## 2014-01-21 NOTE — Telephone Encounter (Signed)
Requests has been faxed to lab

## 2014-01-23 ENCOUNTER — Ambulatory Visit
Admission: RE | Admit: 2014-01-23 | Discharge: 2014-01-23 | Disposition: A | Discharge status: Home / Self Care | Payer: Commercial Managed Care - HMO | Source: Ambulatory Visit | Attending: Internal Medicine | Admitting: Internal Medicine

## 2014-01-23 DIAGNOSIS — R1900 Intra-abdominal and pelvic swelling, mass and lump, unspecified site: Secondary | ICD-10-CM

## 2014-01-23 LAB — URINE CULTURE: Colony Count: 45000

## 2014-02-03 ENCOUNTER — Ambulatory Visit (INDEPENDENT_AMBULATORY_CARE_PROVIDER_SITE_OTHER): Payer: Commercial Managed Care - HMO | Admitting: Internal Medicine

## 2014-02-03 ENCOUNTER — Encounter: Payer: Self-pay | Admitting: Internal Medicine

## 2014-02-03 ENCOUNTER — Other Ambulatory Visit (INDEPENDENT_AMBULATORY_CARE_PROVIDER_SITE_OTHER): Payer: Commercial Managed Care - HMO

## 2014-02-03 VITALS — BP 126/70 | HR 68 | Temp 96.8°F | Resp 16 | Wt 177.0 lb

## 2014-02-03 DIAGNOSIS — I10 Essential (primary) hypertension: Secondary | ICD-10-CM

## 2014-02-03 DIAGNOSIS — R634 Abnormal weight loss: Secondary | ICD-10-CM

## 2014-02-03 DIAGNOSIS — F03918 Unspecified dementia, unspecified severity, with other behavioral disturbance: Secondary | ICD-10-CM | POA: Insufficient documentation

## 2014-02-03 DIAGNOSIS — E119 Type 2 diabetes mellitus without complications: Secondary | ICD-10-CM

## 2014-02-03 DIAGNOSIS — F0390 Unspecified dementia without behavioral disturbance: Secondary | ICD-10-CM

## 2014-02-03 DIAGNOSIS — K5909 Other constipation: Secondary | ICD-10-CM

## 2014-02-03 DIAGNOSIS — F0391 Unspecified dementia with behavioral disturbance: Secondary | ICD-10-CM | POA: Insufficient documentation

## 2014-02-03 DIAGNOSIS — Z8673 Personal history of transient ischemic attack (TIA), and cerebral infarction without residual deficits: Secondary | ICD-10-CM

## 2014-02-03 DIAGNOSIS — K5900 Constipation, unspecified: Secondary | ICD-10-CM

## 2014-02-03 DIAGNOSIS — K429 Umbilical hernia without obstruction or gangrene: Secondary | ICD-10-CM

## 2014-02-03 LAB — BASIC METABOLIC PANEL
BUN: 22 mg/dL (ref 6–23)
CHLORIDE: 107 meq/L (ref 96–112)
CO2: 29 meq/L (ref 19–32)
Calcium: 9.3 mg/dL (ref 8.4–10.5)
Creatinine, Ser: 1 mg/dL (ref 0.4–1.2)
GFR: 72.49 mL/min (ref >60.00)
Glucose, Bld: 110 mg/dL — ABNORMAL HIGH (ref 70–99)
Potassium: 4.6 mEq/L (ref 3.5–5.1)
SODIUM: 143 meq/L (ref 135–145)

## 2014-02-03 LAB — HEMOGLOBIN A1C: HEMOGLOBIN A1C: 5.9 % (ref 4.6–6.5)

## 2014-02-03 MED ORDER — POLYETHYLENE GLYCOL 3350 17 GM/SCOOP PO POWD: 17.0000 g | Freq: Two times a day (BID) | ORAL | Status: DC | PRN

## 2014-02-03 NOTE — Progress Notes (Signed)
Pre visit review using our clinic review tool, if applicable. No additional management support is needed unless otherwise documented below in the visit note. 

## 2014-02-03 NOTE — Assessment & Plan Note (Signed)
Continue with current prescription therapy as reflected on the Med list.  

## 2014-02-03 NOTE — Assessment & Plan Note (Signed)
Miralax po 

## 2014-02-03 NOTE — Assessment & Plan Note (Addendum)
5/15 US IMPRESSION:  Periumbilical hernia containing small bowel which measures  approximately 4 x 8 cm, as demonstrated on recent CT.  Prior cholecystectomy. No evidence of biliary ductal dilatation.  Stable small hepatic cysts.   No sx's. Surg ref if needed

## 2014-02-03 NOTE — Progress Notes (Signed)
Subjective:    HPI   F/u wt loss, nausea x 2 months, no appetite -much better F/u on her CVA, weakness, HTN C/o constiption  Review of Systems  Constitutional: Positive for fatigue. Negative for fever, chills, diaphoresis, activity change, appetite change and unexpected weight change.  HENT: Negative for congestion, dental problem, ear pain, hearing loss, mouth sores, postnasal drip, sinus pressure, sneezing, sore throat and voice change.   Eyes: Negative for pain and visual disturbance.  Respiratory: Negative for cough, chest tightness, shortness of breath, wheezing and stridor.   Cardiovascular: Negative for chest pain, palpitations and leg swelling.  Gastrointestinal: Positive for nausea and constipation. Negative for vomiting, abdominal pain, diarrhea, blood in stool, abdominal distention and rectal pain.  Genitourinary: Negative for dysuria, hematuria, flank pain, decreased urine volume, vaginal bleeding, vaginal discharge, difficulty urinating, vaginal pain and menstrual problem.  Musculoskeletal: Negative for back pain, gait problem, joint swelling and neck pain.  Skin: Negative for color change, rash and wound.  Neurological: Negative for dizziness, tremors, syncope, speech difficulty and light-headedness.  Hematological: Negative for adenopathy.  Psychiatric/Behavioral: Negative for suicidal ideas, hallucinations, behavioral problems, confusion, sleep disturbance, dysphoric mood and decreased concentration. The patient is nervous/anxious. The patient is not hyperactive.     Wt Readings from Last 3 Encounters:  02/03/14 177 lb (80.287 kg)  01/20/14 177 lb 9.6 oz (80.559 kg)  12/24/13 178 lb 12.8 oz (81.103 kg)   BP Readings from Last 3 Encounters:  02/03/14 126/70  01/20/14 128/78  12/24/13 138/80       Objective:   Physical Exam  Constitutional: She appears well-developed. No distress.  HENT:  Head: Normocephalic.  Right Ear: External ear normal.  Left Ear:  External ear normal.  Nose: Nose normal.  Mouth/Throat: Oropharynx is clear and moist.  Eyes: Conjunctivae are normal. Pupils are equal, round, and reactive to light. Right eye exhibits no discharge. Left eye exhibits no discharge.  Neck: Normal range of motion. Neck supple. No JVD present. No tracheal deviation present. No thyromegaly present.  Cardiovascular: Normal rate, regular rhythm and normal heart sounds.   Pulmonary/Chest: No stridor. No respiratory distress. She has no wheezes.  Abdominal: Soft. Bowel sounds are normal. She exhibits mass. She exhibits no distension. There is no tenderness. There is no rebound and no guarding.  Musculoskeletal: She exhibits no edema and no tenderness.  Lymphadenopathy:    She has no cervical adenopathy.  Neurological: She displays normal reflexes. A cranial nerve deficit is present. She exhibits normal muscle tone. Coordination normal.  R facial droop  weakness R side  Skin: No rash noted. No erythema.  Psychiatric: She has a normal mood and affect. Her behavior is normal.  Umbil hernia  Lab Results  Component Value Date   WBC 6.1 10/04/2013   HGB 11.0* 10/04/2013   HCT 32.1* 10/04/2013   PLT 485* 10/04/2013   GLUCOSE 78 10/04/2013   CHOL  Value: 157        ATP III CLASSIFICATION:  <200     mg/dL   Desirable  960-454200-239  mg/dL   Borderline High  >=098>=240    mg/dL   High 1/19/14785/15/2009   TRIG 61 01/24/2008   HDL 41 01/24/2008   LDLCALC  Value: 104        Total Cholesterol/HDL:CHD Risk Coronary Heart Disease Risk Table                     Men   Women  1/2 Average Risk  3.4   3.3* 01/24/2008   ALT 10 10/04/2013   AST 14 10/04/2013   NA 141 10/04/2013   K 3.9 10/04/2013   CL 100 10/04/2013   CREATININE 1.09 10/04/2013   BUN 17 10/04/2013   CO2 21 10/04/2013   TSH  01/24/2008   INR 1.0 12/23/2008   HGBA1C 6.4* 08/11/2013   US IMPRESSION:  Periumbilical hernia containing small bowel which measures  approximately 4 x 8 cm, as demonstrated on recent CT.  Prior  cholecystectomy. No evidence of biliary ductal dilatation.  Stable small hepatic cysts.        Assessment & Plan:

## 2014-02-03 NOTE — Assessment & Plan Note (Signed)
Wt Readings from Last 3 Encounters:  02/03/14 177 lb (80.287 kg)  01/20/14 177 lb 9.6 oz (80.559 kg)  12/24/13 178 lb 12.8 oz (81.103 kg)

## 2014-02-03 NOTE — Assessment & Plan Note (Signed)
BP Readings from Last 3 Encounters:  02/03/14 126/70  01/20/14 128/78  12/24/13 138/80

## 2014-02-03 NOTE — Assessment & Plan Note (Signed)
Not on rx 

## 2014-02-04 ENCOUNTER — Telehealth: Payer: Self-pay | Admitting: Internal Medicine

## 2014-02-04 NOTE — Telephone Encounter (Signed)
Relevant patient education mailed to patient.  

## 2014-02-23 ENCOUNTER — Telehealth: Payer: Self-pay

## 2014-02-23 NOTE — Telephone Encounter (Signed)
Pt states that you prescribed her miralax is causing her to have diarrhea. Pt states that she only takes this rx 1 time qod and she is concerned with how loose her stool is. Is there anything else that you could prescribe for her to help soften her stool? Please advise

## 2014-02-24 NOTE — Telephone Encounter (Signed)
Pt.notified

## 2014-02-24 NOTE — Telephone Encounter (Signed)
Use 1/4 scoop tmice a week prn Thx

## 2014-03-02 ENCOUNTER — Telehealth: Payer: Self-pay | Admitting: *Deleted

## 2014-03-02 NOTE — Telephone Encounter (Signed)
Left msg on triage been having trouble with her stomach need appt. Called pt back inform her Dr. Macario GoldsPlot first available is bnot until Friday 03/06/13 ot states that will be fine. Made appt @ 4:00pm 03/06/14...Raechel Chute/lmb

## 2014-03-06 ENCOUNTER — Encounter: Payer: Self-pay | Admitting: Internal Medicine

## 2014-03-06 ENCOUNTER — Ambulatory Visit (INDEPENDENT_AMBULATORY_CARE_PROVIDER_SITE_OTHER): Payer: Commercial Managed Care - HMO | Admitting: Internal Medicine

## 2014-03-06 VITALS — BP 120/90 | HR 80 | Temp 98.2°F | Resp 16 | Wt 171.0 lb

## 2014-03-06 DIAGNOSIS — E1349 Other specified diabetes mellitus with other diabetic neurological complication: Secondary | ICD-10-CM

## 2014-03-06 DIAGNOSIS — E1142 Type 2 diabetes mellitus with diabetic polyneuropathy: Secondary | ICD-10-CM

## 2014-03-06 DIAGNOSIS — R197 Diarrhea, unspecified: Secondary | ICD-10-CM

## 2014-03-06 DIAGNOSIS — R634 Abnormal weight loss: Secondary | ICD-10-CM

## 2014-03-06 DIAGNOSIS — E0940 Drug or chemical induced diabetes mellitus with neurological complications with diabetic neuropathy, unspecified: Secondary | ICD-10-CM

## 2014-03-06 DIAGNOSIS — I10 Essential (primary) hypertension: Secondary | ICD-10-CM

## 2014-03-06 MED ORDER — METRONIDAZOLE 500 MG PO TABS: 500.0000 mg | ORAL_TABLET | Freq: Three times a day (TID) | ORAL | Status: DC

## 2014-03-06 NOTE — Assessment & Plan Note (Signed)
Continue with current prescription therapy as reflected on the Med list.  

## 2014-03-06 NOTE — Progress Notes (Signed)
Pre visit review using our clinic review tool, if applicable. No additional management support is needed unless otherwise documented below in the visit note. 

## 2014-03-06 NOTE — Assessment & Plan Note (Signed)
Will cont to watch Wt Readings from Last 3 Encounters:  03/06/14 171 lb (77.565 kg)  02/03/14 177 lb (80.287 kg)  01/20/14 177 lb 9.6 oz (80.559 kg)

## 2014-03-06 NOTE — Assessment & Plan Note (Signed)
Pt declined a specimen collection Empiric Flagyl x 10 d RTC if not better Miralax prn only

## 2014-03-06 NOTE — Progress Notes (Signed)
Subjective:    HPI   F/u wt loss, nausea x 2 months, no appetite -much better F/u on her CVA, weakness, HTN  C/o diarrhea and sometimes constipation  Review of Systems  Constitutional: Positive for fatigue. Negative for fever, chills, diaphoresis, activity change, appetite change and unexpected weight change.  HENT: Negative for congestion, dental problem, ear pain, hearing loss, mouth sores, postnasal drip, sinus pressure, sneezing, sore throat and voice change.   Eyes: Negative for pain and visual disturbance.  Respiratory: Negative for cough, chest tightness, shortness of breath, wheezing and stridor.   Cardiovascular: Negative for chest pain, palpitations and leg swelling.  Gastrointestinal: Positive for nausea and constipation. Negative for vomiting, abdominal pain, diarrhea, blood in stool, abdominal distention and rectal pain.  Genitourinary: Negative for dysuria, hematuria, flank pain, decreased urine volume, vaginal bleeding, vaginal discharge, difficulty urinating, vaginal pain and menstrual problem.  Musculoskeletal: Negative for back pain, gait problem, joint swelling and neck pain.  Skin: Negative for color change, rash and wound.  Neurological: Negative for dizziness, tremors, syncope, speech difficulty and light-headedness.  Hematological: Negative for adenopathy.  Psychiatric/Behavioral: Negative for suicidal ideas, hallucinations, behavioral problems, confusion, sleep disturbance, dysphoric mood and decreased concentration. The patient is nervous/anxious. The patient is not hyperactive.     Wt Readings from Last 3 Encounters:  03/06/14 171 lb (77.565 kg)  02/03/14 177 lb (80.287 kg)  01/20/14 177 lb 9.6 oz (80.559 kg)   BP Readings from Last 3 Encounters:  03/06/14 120/90  02/03/14 126/70  01/20/14 128/78       Objective:   Physical Exam  Constitutional: She appears well-developed. No distress.  HENT:  Head: Normocephalic.  Right Ear: External ear  normal.  Left Ear: External ear normal.  Nose: Nose normal.  Mouth/Throat: Oropharynx is clear and moist.  Eyes: Conjunctivae are normal. Pupils are equal, round, and reactive to light. Right eye exhibits no discharge. Left eye exhibits no discharge.  Neck: Normal range of motion. Neck supple. No JVD present. No tracheal deviation present. No thyromegaly present.  Cardiovascular: Normal rate, regular rhythm and normal heart sounds.   Pulmonary/Chest: No stridor. No respiratory distress. She has no wheezes.  Abdominal: Soft. Bowel sounds are normal. She exhibits mass. She exhibits no distension. There is no tenderness. There is no rebound and no guarding.  Musculoskeletal: She exhibits no edema and no tenderness.  Lymphadenopathy:    She has no cervical adenopathy.  Neurological: She displays normal reflexes. A cranial nerve deficit is present. She exhibits normal muscle tone. Coordination normal.  R facial droop  weakness R side  Skin: No rash noted. No erythema.  Psychiatric: She has a normal mood and affect. Her behavior is normal.  Umbil hernia  Lab Results  Component Value Date   WBC 6.1 10/04/2013   HGB 11.0* 10/04/2013   HCT 32.1* 10/04/2013   PLT 485* 10/04/2013   GLUCOSE 78 10/04/2013   CHOL  Value: 157        ATP III CLASSIFICATION:  <200     mg/dL   Desirable  161-096200-239  mg/dL   Borderline High  >=045>=240    mg/dL   High 4/09/81195/15/2009   TRIG 61 01/24/2008   HDL 41 01/24/2008   LDLCALC  Value: 104        Total Cholesterol/HDL:CHD Risk Coronary Heart Disease Risk Table                     Men   Women  1/2  Average Risk   3.4   3.3* 01/24/2008   ALT 10 10/04/2013   AST 14 10/04/2013   NA 141 10/04/2013   K 3.9 10/04/2013   CL 100 10/04/2013   CREATININE 1.09 10/04/2013   BUN 17 10/04/2013   CO2 21 10/04/2013   TSH  01/24/2008   INR 1.0 12/23/2008   HGBA1C 6.4* 08/11/2013   US IMPRESSION:  Periumbilical hernia containing small bowel which measures  approximately 4 x 8 cm, as demonstrated on  recent CT.  Prior cholecystectomy. No evidence of biliary ductal dilatation.  Stable small hepatic cysts.        Assessment & Plan:

## 2014-03-10 ENCOUNTER — Telehealth: Payer: Self-pay | Admitting: Internal Medicine

## 2014-03-10 ENCOUNTER — Other Ambulatory Visit (INDEPENDENT_AMBULATORY_CARE_PROVIDER_SITE_OTHER): Payer: Commercial Managed Care - HMO

## 2014-03-10 ENCOUNTER — Ambulatory Visit: Payer: Commercial Managed Care - HMO | Admitting: Internal Medicine

## 2014-03-10 ENCOUNTER — Telehealth: Payer: Self-pay | Admitting: *Deleted

## 2014-03-10 ENCOUNTER — Other Ambulatory Visit: Payer: Self-pay | Admitting: Internal Medicine

## 2014-03-10 DIAGNOSIS — E119 Type 2 diabetes mellitus without complications: Secondary | ICD-10-CM

## 2014-03-10 DIAGNOSIS — I10 Essential (primary) hypertension: Secondary | ICD-10-CM

## 2014-03-10 LAB — HEMOGLOBIN A1C: HEMOGLOBIN A1C: 5.9 % (ref 4.6–6.5)

## 2014-03-10 LAB — BASIC METABOLIC PANEL
BUN: 25 mg/dL — ABNORMAL HIGH (ref 6–23)
CO2: 31 meq/L (ref 19–32)
Calcium: 9.2 mg/dL (ref 8.4–10.5)
Chloride: 104 mEq/L (ref 96–112)
Creatinine, Ser: 1.1 mg/dL (ref 0.4–1.2)
GFR: 66.13 mL/min (ref >60.00)
GLUCOSE: 103 mg/dL — AB (ref 70–99)
Potassium: 4.5 mEq/L (ref 3.5–5.1)
SODIUM: 140 meq/L (ref 135–145)

## 2014-03-10 LAB — LIPID PANEL
CHOL/HDL RATIO: 2
Cholesterol: 133 mg/dL (ref 0–200)
HDL: 59.8 mg/dL (ref >39.00)
LDL Cholesterol: 64 mg/dL (ref 0–99)
NONHDL: 73.2
TRIGLYCERIDES: 48 mg/dL (ref 0.0–149.0)
VLDL: 9.6 mg/dL (ref 0.0–40.0)

## 2014-03-10 NOTE — Telephone Encounter (Signed)
Pt request phone call from the assistant concern about hydroxyzine that Dr. Macario GoldsPlot gave her 2 week ago, pt stated that [employer] contacted her and said that this medication is not right. Please call pt

## 2014-03-10 NOTE — Telephone Encounter (Signed)
Called pt to verify msg. Hydroxyzine is not on pt med list. Pt states she received letter from insurance stating the med that md rx for her stomach was not written correct. Per ov notes md rx flagyl foe 10 days only. Advise pt to disregard letter & for her to continue taking flagyl that was rx on 03/06/14 as advise by md.../lmb

## 2014-03-10 NOTE — Telephone Encounter (Signed)
Patient will come in for Lipid panel. FYI.

## 2014-03-26 ENCOUNTER — Telehealth: Payer: Self-pay | Admitting: *Deleted

## 2014-03-26 MED ORDER — METRONIDAZOLE 500 MG PO TABS: 500.0000 mg | ORAL_TABLET | Freq: Three times a day (TID) | ORAL | Status: DC

## 2014-03-26 NOTE — Telephone Encounter (Signed)
Pt stated she has taking all of the flagyl md rx for her diarrhea sxs. Still having a little diarrhea but not like it was. Pt is wanting to know does she need to continue taking flagyl if so will need another prescription...Raechel Chute/lmb

## 2014-03-26 NOTE — Telephone Encounter (Signed)
OK to refill this prescription  Thank you!

## 2014-05-21 ENCOUNTER — Telehealth: Payer: Self-pay

## 2014-05-21 NOTE — Telephone Encounter (Signed)
Re: called to schedule Nurse Visit to recheck Blood Pressure 

## 2014-06-02 ENCOUNTER — Encounter: Payer: Self-pay | Admitting: Internal Medicine

## 2014-06-02 ENCOUNTER — Ambulatory Visit (INDEPENDENT_AMBULATORY_CARE_PROVIDER_SITE_OTHER): Payer: Commercial Managed Care - HMO | Admitting: Internal Medicine

## 2014-06-02 ENCOUNTER — Other Ambulatory Visit (INDEPENDENT_AMBULATORY_CARE_PROVIDER_SITE_OTHER): Payer: Commercial Managed Care - HMO

## 2014-06-02 VITALS — BP 124/80 | HR 92 | Temp 97.8°F | Wt 169.0 lb

## 2014-06-02 DIAGNOSIS — F0390 Unspecified dementia without behavioral disturbance: Secondary | ICD-10-CM

## 2014-06-02 DIAGNOSIS — E1329 Other specified diabetes mellitus with other diabetic kidney complication: Secondary | ICD-10-CM

## 2014-06-02 DIAGNOSIS — N189 Chronic kidney disease, unspecified: Secondary | ICD-10-CM

## 2014-06-02 DIAGNOSIS — E0822 Diabetes mellitus due to underlying condition with diabetic chronic kidney disease: Secondary | ICD-10-CM

## 2014-06-02 DIAGNOSIS — K5900 Constipation, unspecified: Secondary | ICD-10-CM

## 2014-06-02 DIAGNOSIS — Z23 Encounter for immunization: Secondary | ICD-10-CM

## 2014-06-02 DIAGNOSIS — K5909 Other constipation: Secondary | ICD-10-CM

## 2014-06-02 LAB — BASIC METABOLIC PANEL WITH GFR
BUN: 17 mg/dL (ref 6–23)
CO2: 29 meq/L (ref 19–32)
Calcium: 9.3 mg/dL (ref 8.4–10.5)
Chloride: 107 meq/L (ref 96–112)
Creatinine, Ser: 0.9 mg/dL (ref 0.4–1.2)
GFR: 79.98 mL/min
Glucose, Bld: 101 mg/dL — ABNORMAL HIGH (ref 70–99)
Potassium: 4.3 meq/L (ref 3.5–5.1)
Sodium: 142 meq/L (ref 135–145)

## 2014-06-02 LAB — HEMOGLOBIN A1C: Hgb A1c MFr Bld: 5.8 % (ref 4.6–6.5)

## 2014-06-02 MED ORDER — PAROXETINE HCL 10 MG PO TABS: 10.0000 mg | ORAL_TABLET | Freq: Every day | ORAL | Status: DC

## 2014-06-02 NOTE — Progress Notes (Signed)
Pre visit review using our clinic review tool, if applicable. No additional management support is needed unless otherwise documented below in the visit note. 

## 2014-06-02 NOTE — Assessment & Plan Note (Signed)
Doing well 

## 2014-06-02 NOTE — Assessment & Plan Note (Signed)
Continue with current prescription therapy as reflected on the Med list.  

## 2014-06-02 NOTE — Progress Notes (Signed)
Subjective:    HPI   F/u wt loss, nausea x 2 months, no appetite -much better F/u on her CVA, weakness, HTN  f/u diarrhea and sometimes constipation  Review of Systems  Constitutional: Positive for fatigue. Negative for fever, chills, diaphoresis, activity change, appetite change and unexpected weight change.  HENT: Negative for congestion, dental problem, ear pain, hearing loss, mouth sores, postnasal drip, sinus pressure, sneezing, sore throat and voice change.   Eyes: Negative for pain and visual disturbance.  Respiratory: Negative for cough, chest tightness, shortness of breath, wheezing and stridor.   Cardiovascular: Negative for chest pain, palpitations and leg swelling.  Gastrointestinal: Positive for nausea and constipation. Negative for vomiting, abdominal pain, diarrhea, blood in stool, abdominal distention and rectal pain.  Genitourinary: Negative for dysuria, hematuria, flank pain, decreased urine volume, vaginal bleeding, vaginal discharge, difficulty urinating, vaginal pain and menstrual problem.  Musculoskeletal: Negative for back pain, gait problem, joint swelling and neck pain.  Skin: Negative for color change, rash and wound.  Neurological: Negative for dizziness, tremors, syncope, speech difficulty and light-headedness.  Hematological: Negative for adenopathy.  Psychiatric/Behavioral: Negative for suicidal ideas, hallucinations, behavioral problems, confusion, sleep disturbance, dysphoric mood and decreased concentration. The patient is nervous/anxious. The patient is not hyperactive.     Wt Readings from Last 3 Encounters:  06/02/14 169 lb (76.658 kg)  03/06/14 171 lb (77.565 kg)  02/03/14 177 lb (80.287 kg)   BP Readings from Last 3 Encounters:  06/02/14 124/80  03/06/14 120/90  02/03/14 126/70       Objective:   Physical Exam  Constitutional: She appears well-developed. No distress.  HENT:  Head: Normocephalic.  Right Ear: External ear normal.    Left Ear: External ear normal.  Nose: Nose normal.  Mouth/Throat: Oropharynx is clear and moist.  Eyes: Conjunctivae are normal. Pupils are equal, round, and reactive to light. Right eye exhibits no discharge. Left eye exhibits no discharge.  Neck: Normal range of motion. Neck supple. No JVD present. No tracheal deviation present. No thyromegaly present.  Cardiovascular: Normal rate, regular rhythm and normal heart sounds.   Pulmonary/Chest: No stridor. No respiratory distress. She has no wheezes.  Abdominal: Soft. Bowel sounds are normal. She exhibits mass. She exhibits no distension. There is no tenderness. There is no rebound and no guarding.  Musculoskeletal: She exhibits no edema and no tenderness.  Lymphadenopathy:    She has no cervical adenopathy.  Neurological: She displays normal reflexes. A cranial nerve deficit is present. She exhibits normal muscle tone. Coordination normal.  R facial droop  weakness R side  Skin: No rash noted. No erythema.  Psychiatric: She has a normal mood and affect. Her behavior is normal.  Umbil hernia  Lab Results  Component Value Date   WBC 6.1 10/04/2013   HGB 11.0* 10/04/2013   HCT 32.1* 10/04/2013   PLT 485* 10/04/2013   GLUCOSE 78 10/04/2013   CHOL  Value: 157        ATP III CLASSIFICATION:  <200     mg/dL   Desirable  161-096  mg/dL   Borderline High  >=045    mg/dL   High 12/18/8117   TRIG 61 01/24/2008   HDL 41 01/24/2008   LDLCALC  Value: 104        Total Cholesterol/HDL:CHD Risk Coronary Heart Disease Risk Table                     Men   Women  1/2 Average  Risk   3.4   3.3* 01/24/2008   ALT 10 10/04/2013   AST 14 10/04/2013   NA 141 10/04/2013   K 3.9 10/04/2013   CL 100 10/04/2013   CREATININE 1.09 10/04/2013   BUN 17 10/04/2013   CO2 21 10/04/2013   TSH  01/24/2008   INR 1.0 12/23/2008   HGBA1C 6.4* 08/11/2013   US IMPRESSION:  Periumbilical hernia containing small bowel which measures  approximately 4 x 8 cm, as demonstrated on recent CT.   Prior cholecystectomy. No evidence of biliary ductal dilatation.  Stable small hepatic cysts.        Assessment & Plan:

## 2014-06-02 NOTE — Assessment & Plan Note (Signed)
Better  

## 2014-06-15 ENCOUNTER — Telehealth: Payer: Self-pay | Admitting: Internal Medicine

## 2014-06-15 MED ORDER — PAROXETINE HCL 10 MG PO TABS: 10.0000 mg | ORAL_TABLET | Freq: Every day | ORAL | Status: DC

## 2014-06-15 NOTE — Telephone Encounter (Signed)
Pt said Dr Posey ReaPlotnikov was suppose to call in PARoxetine (PAXIL) 10 MG tablet [[id number]][[id number]] but she went to pharmicy and they told her they didn't have it.

## 2014-06-15 NOTE — Telephone Encounter (Signed)
Called pt back no answer LMOM md [facility]. [facility].../lmb

## 2014-06-29 ENCOUNTER — Telehealth: Payer: Self-pay | Admitting: Internal Medicine

## 2014-06-29 NOTE — Telephone Encounter (Signed)
Patient would like for you to call her, she didn't give me anymore info.

## 2014-06-30 NOTE — Telephone Encounter (Signed)
Left mess for patient to call back.  

## 2014-07-27 ENCOUNTER — Telehealth: Payer: Self-pay | Admitting: Internal Medicine

## 2014-07-27 NOTE — Telephone Encounter (Signed)
OK to fill this prescription with additional refills x11 Thank you!  

## 2014-07-27 NOTE — Telephone Encounter (Signed)
Pt wants a phone call regarding medication, pt hard to understand, she mentioned something about her nerve pills.

## 2014-07-27 NOTE — Telephone Encounter (Signed)
Patient is needing Paxil 10 mg. Patient should have refills available. Patient had rx refilled on 07/14/14, pt states that she only has 2 pills left. Patient wants to know if new scripts can be sent in to pharmacy? Please advise?

## 2014-07-28 MED ORDER — PAROXETINE HCL 10 MG PO TABS: 10.0000 mg | ORAL_TABLET | Freq: Every day | ORAL | Status: DC

## 2014-07-28 NOTE — Telephone Encounter (Signed)
Rx sent to pharmacy   

## 2014-09-29 ENCOUNTER — Other Ambulatory Visit: Payer: Self-pay | Admitting: Internal Medicine

## 2014-10-03 ENCOUNTER — Other Ambulatory Visit: Payer: Self-pay | Admitting: Internal Medicine

## 2014-10-05 ENCOUNTER — Other Ambulatory Visit (INDEPENDENT_AMBULATORY_CARE_PROVIDER_SITE_OTHER): Payer: Commercial Managed Care - HMO

## 2014-10-05 ENCOUNTER — Encounter: Payer: Self-pay | Admitting: Internal Medicine

## 2014-10-05 ENCOUNTER — Ambulatory Visit (INDEPENDENT_AMBULATORY_CARE_PROVIDER_SITE_OTHER): Payer: Commercial Managed Care - HMO | Admitting: Internal Medicine

## 2014-10-05 VITALS — BP 120/70 | HR 81 | Temp 98.5°F | Wt 171.0 lb

## 2014-10-05 DIAGNOSIS — I10 Essential (primary) hypertension: Secondary | ICD-10-CM

## 2014-10-05 DIAGNOSIS — G8190 Hemiplegia, unspecified affecting unspecified side: Secondary | ICD-10-CM

## 2014-10-05 DIAGNOSIS — R634 Abnormal weight loss: Secondary | ICD-10-CM

## 2014-10-05 DIAGNOSIS — E0840 Diabetes mellitus due to underlying condition with diabetic neuropathy, unspecified: Secondary | ICD-10-CM

## 2014-10-05 DIAGNOSIS — G8191 Hemiplegia, unspecified affecting right dominant side: Secondary | ICD-10-CM

## 2014-10-05 DIAGNOSIS — Z8673 Personal history of transient ischemic attack (TIA), and cerebral infarction without residual deficits: Secondary | ICD-10-CM

## 2014-10-05 LAB — BASIC METABOLIC PANEL
BUN: 24 mg/dL — ABNORMAL HIGH (ref 6–23)
CHLORIDE: 107 meq/L (ref 96–112)
CO2: 28 mEq/L (ref 19–32)
Calcium: 9.2 mg/dL (ref 8.4–10.5)
Creatinine, Ser: 0.88 mg/dL (ref 0.40–1.20)
GFR: 80.95 mL/min (ref >60.00)
GLUCOSE: 95 mg/dL (ref 70–99)
POTASSIUM: 4.4 meq/L (ref 3.5–5.1)
SODIUM: 143 meq/L (ref 135–145)

## 2014-10-05 LAB — HEMOGLOBIN A1C: Hgb A1c MFr Bld: 6.1 % (ref 4.6–6.5)

## 2014-10-05 MED ORDER — METFORMIN HCL 500 MG PO TABS: 1000.0000 mg | ORAL_TABLET | Freq: Two times a day (BID) | ORAL | Status: DC

## 2014-10-05 MED ORDER — CLOPIDOGREL BISULFATE 75 MG PO TABS: 75.0000 mg | ORAL_TABLET | Freq: Every day | ORAL | Status: DC

## 2014-10-05 MED ORDER — PAROXETINE HCL 10 MG PO TABS: 10.0000 mg | ORAL_TABLET | Freq: Every day | ORAL | Status: DC

## 2014-10-05 MED ORDER — SPIRONOLACTONE 25 MG PO TABS: 25.0000 mg | ORAL_TABLET | Freq: Every day | ORAL | Status: DC

## 2014-10-05 NOTE — Progress Notes (Signed)
Subjective:    HPI   F/u wt loss, nausea x 2 months, no appetite -much better F/u on her CVA, weakness, HTN  f/u diarrhea and sometimes constipation - chronic  Review of Systems  Constitutional: Positive for fatigue. Negative for fever, chills, diaphoresis, activity change, appetite change and unexpected weight change.  HENT: Negative for congestion, dental problem, ear pain, hearing loss, mouth sores, postnasal drip, sinus pressure, sneezing, sore throat and voice change.   Eyes: Negative for pain and visual disturbance.  Respiratory: Negative for cough, chest tightness, shortness of breath, wheezing and stridor.   Cardiovascular: Negative for chest pain, palpitations and leg swelling.  Gastrointestinal: Positive for nausea and constipation. Negative for vomiting, abdominal pain, diarrhea, blood in stool, abdominal distention and rectal pain.  Genitourinary: Negative for dysuria, hematuria, flank pain, decreased urine volume, vaginal bleeding, vaginal discharge, difficulty urinating, vaginal pain and menstrual problem.  Musculoskeletal: Negative for back pain, joint swelling, gait problem and neck pain.  Skin: Negative for color change, rash and wound.  Neurological: Negative for dizziness, tremors, syncope, speech difficulty and light-headedness.  Hematological: Negative for adenopathy.  Psychiatric/Behavioral: Negative for suicidal ideas, hallucinations, behavioral problems, confusion, sleep disturbance, dysphoric mood and decreased concentration. The patient is nervous/anxious. The patient is not hyperactive.     Wt Readings from Last 3 Encounters:  10/05/14 171 lb (77.565 kg)  06/02/14 169 lb (76.658 kg)  03/06/14 171 lb (77.565 kg)   BP Readings from Last 3 Encounters:  10/05/14 120/70  06/02/14 124/80  03/06/14 120/90       Objective:   Physical Exam  Constitutional: She appears well-developed. No distress.  HENT:  Head: Normocephalic.  Right Ear: External ear  normal.  Left Ear: External ear normal.  Nose: Nose normal.  Mouth/Throat: Oropharynx is clear and moist.  Eyes: Conjunctivae are normal. Pupils are equal, round, and reactive to light. Right eye exhibits no discharge. Left eye exhibits no discharge.  Neck: Normal range of motion. Neck supple. No JVD present. No tracheal deviation present. No thyromegaly present.  Cardiovascular: Normal rate, regular rhythm and normal heart sounds.   Pulmonary/Chest: No stridor. No respiratory distress. She has no wheezes.  Abdominal: Soft. Bowel sounds are normal. She exhibits mass. She exhibits no distension. There is no tenderness. There is no rebound and no guarding.  Musculoskeletal: She exhibits no edema or tenderness.  Lymphadenopathy:    She has no cervical adenopathy.  Neurological: She displays normal reflexes. A cranial nerve deficit is present. She exhibits normal muscle tone. Coordination normal.  R facial droop  weakness R side  Skin: No rash noted. No erythema.  Psychiatric: She has a normal mood and affect. Her behavior is normal.  Umbil hernia  Lab Results  Component Value Date   WBC 6.1 10/04/2013   HGB 11.0* 10/04/2013   HCT 32.1* 10/04/2013   PLT 485* 10/04/2013   GLUCOSE 78 10/04/2013   CHOL  Value: 157        ATP III CLASSIFICATION:  <200     mg/dL   Desirable  161-096  mg/dL   Borderline High  >=045    mg/dL   High 12/18/8117   TRIG 61 01/24/2008   HDL 41 01/24/2008   LDLCALC  Value: 104        Total Cholesterol/HDL:CHD Risk Coronary Heart Disease Risk Table                     Men   Women  1/2 Average  Risk   3.4   3.3* 01/24/2008   ALT 10 10/04/2013   AST 14 10/04/2013   NA 141 10/04/2013   K 3.9 10/04/2013   CL 100 10/04/2013   CREATININE 1.09 10/04/2013   BUN 17 10/04/2013   CO2 21 10/04/2013   TSH  01/24/2008   INR 1.0 12/23/2008   HGBA1C 6.4* 08/11/2013   US IMPRESSION:  Periumbilical hernia containing small bowel which measures  approximately 4 x 8 cm, as demonstrated on recent  CT.  Prior cholecystectomy. No evidence of biliary ductal dilatation.  Stable small hepatic cysts.        Assessment & Plan:

## 2014-10-05 NOTE — Assessment & Plan Note (Signed)
Continue with current prescription therapy as reflected on the Med list.  

## 2014-10-05 NOTE — Assessment & Plan Note (Signed)
Wt Readings from Last 3 Encounters:  10/05/14 171 lb (77.565 kg)  06/02/14 169 lb (76.658 kg)  03/06/14 171 lb (77.565 kg)

## 2014-10-05 NOTE — Assessment & Plan Note (Signed)
Stable  Continue with current prescription therapy as reflected on the Med list for CVA

## 2014-10-05 NOTE — Assessment & Plan Note (Signed)
Continue with current prescription therapy as reflected on the Med list. Labs  

## 2014-10-05 NOTE — Progress Notes (Signed)
Pre visit review using our clinic review tool, if applicable. No additional management support is needed unless otherwise documented below in the visit note. 

## 2014-10-26 ENCOUNTER — Emergency Department (HOSPITAL_COMMUNITY): Payer: Medicare HMO

## 2014-10-26 ENCOUNTER — Encounter (HOSPITAL_COMMUNITY): Payer: Self-pay

## 2014-10-26 ENCOUNTER — Inpatient Hospital Stay (HOSPITAL_COMMUNITY): Payer: Medicare HMO

## 2014-10-26 ENCOUNTER — Inpatient Hospital Stay (HOSPITAL_COMMUNITY)
Admission: EM | Admit: 2014-10-26 | Discharge: 2014-10-30 | DRG: 884 | Disposition: A | Discharge status: Home Health | Payer: Medicare HMO | Attending: Internal Medicine | Admitting: Internal Medicine

## 2014-10-26 DIAGNOSIS — L02425 Furuncle of right lower limb: Secondary | ICD-10-CM | POA: Diagnosis not present

## 2014-10-26 DIAGNOSIS — F05 Delirium due to known physiological condition: Secondary | ICD-10-CM | POA: Diagnosis present

## 2014-10-26 DIAGNOSIS — M25551 Pain in right hip: Secondary | ICD-10-CM | POA: Diagnosis not present

## 2014-10-26 DIAGNOSIS — E119 Type 2 diabetes mellitus without complications: Secondary | ICD-10-CM | POA: Insufficient documentation

## 2014-10-26 DIAGNOSIS — R0602 Shortness of breath: Secondary | ICD-10-CM | POA: Diagnosis not present

## 2014-10-26 DIAGNOSIS — K5900 Constipation, unspecified: Secondary | ICD-10-CM | POA: Diagnosis present

## 2014-10-26 DIAGNOSIS — A419 Sepsis, unspecified organism: Secondary | ICD-10-CM | POA: Diagnosis not present

## 2014-10-26 DIAGNOSIS — E1159 Type 2 diabetes mellitus with other circulatory complications: Secondary | ICD-10-CM | POA: Diagnosis not present

## 2014-10-26 DIAGNOSIS — Z9049 Acquired absence of other specified parts of digestive tract: Secondary | ICD-10-CM | POA: Diagnosis present

## 2014-10-26 DIAGNOSIS — E876 Hypokalemia: Secondary | ICD-10-CM | POA: Diagnosis present

## 2014-10-26 DIAGNOSIS — Z79899 Other long term (current) drug therapy: Secondary | ICD-10-CM

## 2014-10-26 DIAGNOSIS — E1169 Type 2 diabetes mellitus with other specified complication: Secondary | ICD-10-CM | POA: Insufficient documentation

## 2014-10-26 DIAGNOSIS — R410 Disorientation, unspecified: Secondary | ICD-10-CM | POA: Diagnosis not present

## 2014-10-26 DIAGNOSIS — F0390 Unspecified dementia without behavioral disturbance: Secondary | ICD-10-CM | POA: Diagnosis not present

## 2014-10-26 DIAGNOSIS — I69951 Hemiplegia and hemiparesis following unspecified cerebrovascular disease affecting right dominant side: Secondary | ICD-10-CM

## 2014-10-26 DIAGNOSIS — I639 Cerebral infarction, unspecified: Secondary | ICD-10-CM | POA: Diagnosis not present

## 2014-10-26 DIAGNOSIS — R509 Fever, unspecified: Secondary | ICD-10-CM | POA: Diagnosis not present

## 2014-10-26 DIAGNOSIS — D638 Anemia in other chronic diseases classified elsewhere: Secondary | ICD-10-CM | POA: Diagnosis present

## 2014-10-26 DIAGNOSIS — L03115 Cellulitis of right lower limb: Secondary | ICD-10-CM | POA: Diagnosis not present

## 2014-10-26 DIAGNOSIS — R51 Headache: Secondary | ICD-10-CM | POA: Diagnosis not present

## 2014-10-26 DIAGNOSIS — F03918 Unspecified dementia, unspecified severity, with other behavioral disturbance: Secondary | ICD-10-CM | POA: Diagnosis present

## 2014-10-26 DIAGNOSIS — Z7902 Long term (current) use of antithrombotics/antiplatelets: Secondary | ICD-10-CM | POA: Diagnosis not present

## 2014-10-26 DIAGNOSIS — R000 Tachycardia, unspecified: Secondary | ICD-10-CM | POA: Diagnosis not present

## 2014-10-26 DIAGNOSIS — Z8673 Personal history of transient ischemic attack (TIA), and cerebral infarction without residual deficits: Secondary | ICD-10-CM

## 2014-10-26 DIAGNOSIS — R6510 Systemic inflammatory response syndrome (SIRS) of non-infectious origin without acute organ dysfunction: Secondary | ICD-10-CM | POA: Diagnosis not present

## 2014-10-26 DIAGNOSIS — E669 Obesity, unspecified: Secondary | ICD-10-CM

## 2014-10-26 DIAGNOSIS — Z9114 Patient's other noncompliance with medication regimen: Secondary | ICD-10-CM | POA: Diagnosis present

## 2014-10-26 DIAGNOSIS — S199XXA Unspecified injury of neck, initial encounter: Secondary | ICD-10-CM | POA: Diagnosis not present

## 2014-10-26 DIAGNOSIS — R4182 Altered mental status, unspecified: Secondary | ICD-10-CM | POA: Diagnosis not present

## 2014-10-26 DIAGNOSIS — W19XXXA Unspecified fall, initial encounter: Secondary | ICD-10-CM | POA: Diagnosis present

## 2014-10-26 DIAGNOSIS — S79911A Unspecified injury of right hip, initial encounter: Secondary | ICD-10-CM | POA: Diagnosis not present

## 2014-10-26 DIAGNOSIS — S0990XA Unspecified injury of head, initial encounter: Secondary | ICD-10-CM | POA: Diagnosis not present

## 2014-10-26 DIAGNOSIS — R079 Chest pain, unspecified: Secondary | ICD-10-CM | POA: Diagnosis not present

## 2014-10-26 DIAGNOSIS — I69928 Other speech and language deficits following unspecified cerebrovascular disease: Secondary | ICD-10-CM | POA: Diagnosis not present

## 2014-10-26 DIAGNOSIS — Z888 Allergy status to other drugs, medicaments and biological substances status: Secondary | ICD-10-CM | POA: Diagnosis not present

## 2014-10-26 DIAGNOSIS — I10 Essential (primary) hypertension: Secondary | ICD-10-CM | POA: Diagnosis not present

## 2014-10-26 DIAGNOSIS — R531 Weakness: Secondary | ICD-10-CM | POA: Diagnosis not present

## 2014-10-26 DIAGNOSIS — E785 Hyperlipidemia, unspecified: Secondary | ICD-10-CM | POA: Diagnosis present

## 2014-10-26 DIAGNOSIS — F0391 Unspecified dementia with behavioral disturbance: Secondary | ICD-10-CM | POA: Diagnosis present

## 2014-10-26 LAB — COMPREHENSIVE METABOLIC PANEL
ALT: 9 U/L (ref 0–35)
AST: 16 U/L (ref 0–37)
Albumin: 3.7 g/dL (ref 3.5–5.2)
Alkaline Phosphatase: 58 U/L (ref 39–117)
Anion gap: 5 (ref 5–15)
BUN: 14 mg/dL (ref 6–23)
CALCIUM: 8.5 mg/dL (ref 8.4–10.5)
CO2: 31 mmol/L (ref 19–32)
Chloride: 102 mmol/L (ref 96–112)
Creatinine, Ser: 0.98 mg/dL (ref 0.50–1.10)
GFR calc Af Amer: 65 mL/min — ABNORMAL LOW (ref >90)
GFR calc non Af Amer: 56 mL/min — ABNORMAL LOW (ref >90)
Glucose, Bld: 108 mg/dL — ABNORMAL HIGH (ref 70–99)
POTASSIUM: 3 mmol/L — AB (ref 3.5–5.1)
SODIUM: 138 mmol/L (ref 135–145)
Total Bilirubin: 1.3 mg/dL — ABNORMAL HIGH (ref 0.3–1.2)
Total Protein: 6.3 g/dL (ref 6.0–8.3)

## 2014-10-26 LAB — CBG MONITORING, ED: Glucose-Capillary: 96 mg/dL (ref 70–99)

## 2014-10-26 LAB — CBC
HCT: 30.2 % — ABNORMAL LOW (ref 36.0–46.0)
HEMOGLOBIN: 10.3 g/dL — AB (ref 12.0–15.0)
MCH: 30.5 pg (ref 26.0–34.0)
MCHC: 34.1 g/dL (ref 30.0–36.0)
MCV: 89.3 fL (ref 78.0–100.0)
Platelets: 302 10*3/uL (ref 150–400)
RBC: 3.38 MIL/uL — ABNORMAL LOW (ref 3.87–5.11)
RDW: 14.7 % (ref 11.5–15.5)
WBC: 14 10*3/uL — ABNORMAL HIGH (ref 4.0–10.5)

## 2014-10-26 LAB — URINALYSIS, ROUTINE W REFLEX MICROSCOPIC
GLUCOSE, UA: NEGATIVE mg/dL
KETONES UR: 15 mg/dL — AB
LEUKOCYTES UA: NEGATIVE
NITRITE: NEGATIVE
PH: 5.5 (ref 5.0–8.0)
PROTEIN: NEGATIVE mg/dL
SPECIFIC GRAVITY, URINE: 1.022 (ref 1.005–1.030)
Urobilinogen, UA: 1 mg/dL (ref 0.0–1.0)

## 2014-10-26 LAB — URINE MICROSCOPIC-ADD ON

## 2014-10-26 LAB — PROTIME-INR
INR: 1.16 (ref 0.00–1.49)
Prothrombin Time: 15 seconds (ref 11.6–15.2)

## 2014-10-26 MED ORDER — SODIUM CHLORIDE 0.9 % IV SOLN
INTRAVENOUS | Status: DC
  Administered 2014-10-26 – 2014-10-30 (×6): via INTRAVENOUS

## 2014-10-26 MED ORDER — POLYETHYLENE GLYCOL 3350 17 GM/SCOOP PO POWD
17.0000 g | Freq: Two times a day (BID) | ORAL | Status: DC | PRN
  Filled 2014-10-26: qty 255

## 2014-10-26 MED ORDER — HEPARIN SODIUM (PORCINE) 5000 UNIT/ML IJ SOLN
5000.0000 [IU] | Freq: Three times a day (TID) | INTRAMUSCULAR | Status: DC
  Administered 2014-10-26 – 2014-10-30 (×12): 5000 [IU] via SUBCUTANEOUS
  Filled 2014-10-26 (×12): qty 1

## 2014-10-26 MED ORDER — POTASSIUM CHLORIDE 20 MEQ PO PACK: 40.0000 meq | PACK | Freq: Two times a day (BID) | ORAL | Status: DC

## 2014-10-26 MED ORDER — SODIUM CHLORIDE 0.9 % IJ SOLN
3.0000 mL | Freq: Two times a day (BID) | INTRAMUSCULAR | Status: DC
  Administered 2014-10-26 – 2014-10-29 (×3): 3 mL via INTRAVENOUS

## 2014-10-26 MED ORDER — CLOPIDOGREL BISULFATE 75 MG PO TABS
75.0000 mg | ORAL_TABLET | Freq: Every day | ORAL | Status: DC
  Administered 2014-10-27 – 2014-10-30 (×4): 75 mg via ORAL
  Filled 2014-10-26 (×4): qty 1

## 2014-10-26 MED ORDER — POTASSIUM CHLORIDE CRYS ER 20 MEQ PO TBCR: 40.0000 meq | EXTENDED_RELEASE_TABLET | Freq: Once | ORAL | Status: DC

## 2014-10-26 MED ORDER — LORATADINE 10 MG PO TABS: 10.0000 mg | ORAL_TABLET | Freq: Every day | ORAL | Status: DC | PRN

## 2014-10-26 MED ORDER — PAROXETINE HCL 10 MG PO TABS
10.0000 mg | ORAL_TABLET | Freq: Every day | ORAL | Status: DC
  Administered 2014-10-27 – 2014-10-30 (×4): 10 mg via ORAL
  Filled 2014-10-26 (×4): qty 1

## 2014-10-26 MED ORDER — POLYETHYLENE GLYCOL 3350 17 G PO PACK: 17.0000 g | PACK | Freq: Two times a day (BID) | ORAL | Status: DC | PRN

## 2014-10-26 MED ORDER — METFORMIN HCL 500 MG PO TABS
1000.0000 mg | ORAL_TABLET | Freq: Two times a day (BID) | ORAL | Status: DC
  Administered 2014-10-26: 1000 mg via ORAL

## 2014-10-26 MED ORDER — METFORMIN HCL 500 MG PO TABS
1000.0000 mg | ORAL_TABLET | Freq: Two times a day (BID) | ORAL | Status: DC
  Administered 2014-10-27: 1000 mg via ORAL
  Filled 2014-10-26 (×2): qty 2

## 2014-10-26 MED ORDER — POTASSIUM CHLORIDE CRYS ER 20 MEQ PO TBCR
40.0000 meq | EXTENDED_RELEASE_TABLET | Freq: Two times a day (BID) | ORAL | Status: DC
  Administered 2014-10-26 – 2014-10-29 (×6): 40 meq via ORAL
  Filled 2014-10-26 (×6): qty 2

## 2014-10-26 MED ORDER — ACETAMINOPHEN 325 MG PO TABS
650.0000 mg | ORAL_TABLET | Freq: Four times a day (QID) | ORAL | Status: DC | PRN
Start: 2014-10-26 — End: 2014-10-30
  Administered 2014-10-26 – 2014-10-29 (×5): 650 mg via ORAL
  Filled 2014-10-26 (×5): qty 2

## 2014-10-26 NOTE — ED Notes (Signed)
Phlebotomy at the bedside  

## 2014-10-26 NOTE — ED Notes (Signed)
Attempted to draw pts labs was unsuccessful. called phlebotomy and spoke with Rosey Batheresa. Phlebotomy to draw pts labs

## 2014-10-26 NOTE — Progress Notes (Signed)
Patient arrived to the unit alert and oriented. No complaints son at bedside.

## 2014-10-26 NOTE — ED Provider Notes (Signed)
CSN: [id number]     Arrival date & time 10/26/14  1510 History   First MD Initiated Contact with Patient 10/26/14 1614     Chief Complaint  Patient presents with  . Altered Mental Status  . Leg Pain     (Consider location/radiation/quality/duration/timing/severity/associated sxs/prior Treatment) HPI Comments: 74 yo F hx of HTN, CVA (3 years prior, with R sided deficits, slurred speech), DMII, presents with CC fall, altered mental status.  Pt was walking without cane, and fell onto left side onto linoleum floor.  Exact history surrounding this uncertain as pt poor historian. Daughter states that her brother (who lives with pt) said that pt had unsteady gate at home, and had to be carried 2/2 being off balance; but that other deficits appears baseline.  In addition pt had mild frontal headache, which has now resolved.  Denies fever, chills, vision changes, palpitations, chest pain, SOB, cough, abdominal pain, nausea, vomiting, diarrhea, rash, myalgias, or any other symptoms.  No other concerns.    The history is provided by the patient and a relative. No language interpreter was used.    Past Medical History  Diagnosis Date  . Diabetes mellitus without complication   . Hypertension   . Stroke   . Acute cholecystitis 08/10/2013    Lap chole on 08/13/13    Past Surgical History  Procedure Laterality Date  . Ercp N/A 08/12/2013    Procedure: ENDOSCOPIC RETROGRADE CHOLANGIOPANCREATOGRAPHY (ERCP);  Surgeon: Theda Belfast, MD;  Location: [facility] ENDOSCOPY;  Service: Endoscopy;  Laterality: N/A;  . Sphincterotomy  08/12/2013    Procedure: SPHINCTEROTOMY;  Surgeon: Theda Belfast, MD;  Location: [facility] ENDOSCOPY;  Service: Endoscopy;;  . Cholecystectomy N/A 08/13/2013    Procedure: LAPAROSCOPIC CHOLECYSTECTOMY ;  Surgeon: Currie Paris, MD;  Location: [facility] OR;  Service: General;  Laterality: N/A;  . Incisional hernia repair N/A 08/20/2013    Procedure: HERNIA REPAIR INCISIONAL UMBILICAL;  Surgeon: Velora Heckler, MD;  Location: [facility] OR;  Service: General;  Laterality: N/A;   History reviewed. No pertinent family history. History  Substance Use Topics  . Smoking status: Never Smoker   . Smokeless tobacco: Not on file  . Alcohol Use: No   OB History    No data available     Review of Systems  Constitutional: Negative for fever and chills.  Respiratory: Negative for cough and shortness of breath.   Cardiovascular: Negative for chest pain and palpitations.  Gastrointestinal: Negative for nausea, vomiting, abdominal pain and diarrhea.  Musculoskeletal: Negative for myalgias.  Skin: Negative for rash.  Neurological: Positive for speech difficulty, weakness and headaches. Negative for dizziness, light-headedness and numbness.  Hematological: Negative for adenopathy. Does not bruise/bleed easily.  All other systems reviewed and are negative.     Allergies  Wellbutrin  Home Medications   Prior to Admission medications   Medication Sig Start Date End Date Taking? Authorizing Provider  acetaminophen (TYLENOL) 325 MG tablet Take 650 mg by mouth every 6 (six) hours as needed for pain.    Historical Provider, MD  clopidogrel (PLAVIX) 75 MG tablet Take 1 tablet (75 mg total) by mouth daily. 10/05/14   Aleksei Plotnikov V, MD  loratadine (CLARITIN) 10 MG tablet Take 10 mg by mouth daily.    Historical Provider, MD  metFORMIN (GLUCOPHAGE) 500 MG tablet Take 2 tablets (1,000 mg total) by mouth 2 (two) times daily. 10/05/14   Aleksei Plotnikov V, MD  PARoxetine (PAXIL) 10 MG tablet Take 1 tablet (10  mg total) by mouth daily. 10/05/14   Aleksei Plotnikov V, MD  polyethylene glycol powder (GLYCOLAX/MIRALAX) powder Take 17 g by mouth 2 (two) times daily as needed. 02/03/14   Aleksei Plotnikov V, MD  spironolactone (ALDACTONE) 25 MG tablet Take 1 tablet (25 mg total) by mouth daily. 10/05/14   Aleksei Plotnikov V, MD   BP 112/50 mmHg  Pulse 84  Temp(Src) 98.4 F (36.9 C) (Oral)  Resp 26  Ht 5\' 5"   (1.651 m)  Wt 161 lb (73.029 kg)  BMI 26.79 kg/m2  SpO2 98% Physical Exam  Constitutional: She appears well-developed and well-nourished.  HENT:  Head: Normocephalic and atraumatic.  Right Ear: External ear normal.  Left Ear: External ear normal.  Mouth/Throat: Oropharynx is clear and moist.  Eyes: Conjunctivae and EOM are normal. Pupils are equal, round, and reactive to light.  Neck: Normal range of motion. Neck supple.  Cardiovascular: Regular rhythm, normal heart sounds and intact distal pulses.   Slight tachycardia  Pulmonary/Chest: Effort normal and breath sounds normal. No respiratory distress. She has no wheezes. She has no rales. She exhibits no tenderness.  Abdominal: Soft. Bowel sounds are normal. She exhibits no distension and no mass. There is no tenderness. There is no rebound and no guarding.  Musculoskeletal: Normal range of motion. She exhibits no edema or tenderness.  Neurological: She is alert.  Alert, awake, oriented to self, place, but not to time.  Pt is at times confused.  Mild dysarthria, and mild RUE weakness.  No gross motor or speech deficits on my exam otherwise.  Unable to stand or ambulate with assistance.   Skin: Skin is warm and dry.  Nursing note and vitals reviewed.   ED Course  Procedures (including critical care time) Labs Review Labs Reviewed  CBC  COMPREHENSIVE METABOLIC PANEL  PROTIME-INR  URINALYSIS, ROUTINE W REFLEX MICROSCOPIC  CBG MONITORING, ED    Imaging Review Dg Chest 2 View  10/26/2014   CLINICAL DATA:  Systemic inflammatory response syndrome. Shortness of breath.  EXAM: CHEST  2 VIEW  COMPARISON:  PA and lateral chest 07/29/2008.  CT chest 07/20/2008.  FINDINGS: The lungs are clear. Heart size is upper normal. No pneumothorax or pleural effusion.  IMPRESSION: No acute disease.   Electronically Signed   By: Drusilla Kannerhomas  Dalessio M.D.   On: 10/26/2014 22:45   Ct Head Wo Contrast  10/26/2014   CLINICAL DATA:  74 year old female with  altered level of consciousness. Fall. Pain in the forehead.  EXAM: CT HEAD WITHOUT CONTRAST  TECHNIQUE: Contiguous axial images were obtained from the base of the skull through the vertex without intravenous contrast.  COMPARISON:  PET-CT 07/29/2008.  FINDINGS: Multiple well-defined foci of low attenuation in the basal ganglia bilaterally, similar to the prior examination from 07/29/2008, compatible with old lacunar infarcts. Faint physiologic calcifications in the right basal ganglia again noted. Patchy areas of mild decreased attenuation are noted throughout the deep and periventricular white matter of the cerebral hemispheres bilaterally, compatible with mild chronic microvascular ischemic disease. No acute intracranial abnormalities. Specifically, no evidence of acute intracranial hemorrhage, no definite findings of acute/subacute cerebral ischemia, no mass, mass effect, hydrocephalus or abnormal intra or extra-axial fluid collections. Visualized paranasal sinuses and mastoids are well pneumatized. No acute displaced skull fractures are identified.  IMPRESSION: 1. No acute intracranial abnormalities. 2. Mild chronic microvascular ischemic changes in the cerebral white matter and multiple old bilateral basal ganglia lacunar infarctions redemonstrated, as above.   Electronically Signed   By:  Trudie Reed M.D.   On: 10/26/2014 17:46   Mr Brain Wo Contrast  10/26/2014   CLINICAL DATA:  Fall.  Altered mental status.  Rule out stroke.  EXAM: MRI HEAD WITHOUT CONTRAST  TECHNIQUE: Multiplanar, multiecho pulse sequences of the brain and surrounding structures were obtained without intravenous contrast.  COMPARISON:  CT head 10/26/2014  FINDINGS: Possible small area of acute infarct in the central midbrain. This cannot be confirmed on coronal diffusion-weighted imaging but does appear to have restricted diffusion measuring approximately 3 mm. No other acute infarct  Age-appropriate atrophy. Chronic infarcts in the  basal ganglia bilaterally and left thalamus. Minimal chronic microvascular ischemia in the white matter.  Negative for hemorrhage or fluid collection  Negative for mass or edema.  IMPRESSION: Possible 3 mm acute infarct in the central midbrain, not confirmed on coronal diffusion.  Atrophy and chronic microvascular ischemia.   Electronically Signed   By: Marlan Palau M.D.   On: 10/26/2014 20:41   Dg Hip Unilat With Pelvis 2-3 Views Right  10/26/2014   CLINICAL DATA:  Status post fall was 3 days ago, difficulty walking now due to right hip pain  EXAM: RIGHT HIP (WITH PELVIS) 2-3 VIEWS  COMPARISON:  Coronal and sagittal reconstructed images from an abdominal and pelvic CT scan dated August 18, 2013.  FINDINGS: The bony pelvis is adequately mineralized. There are mild degenerative changes of the lower lumbar spine. The hip joint spaces are reasonably well maintained bilaterally. AP and lateral views of the right hip reveal the articular surfaces of the acetabulum and femoral head to be smoothly rounded. The femoral neck and intertrochanteric regions are normal. The soft tissues overlying the pelvis and right hip are unremarkable.  IMPRESSION: There is no acute or significant chronic bony abnormality of the pelvis or right hip. There is mild degenerative disc disease of the lower lumbar spine.   Electronically Signed   By: David  [place]   On: 10/26/2014 16:16     EKG Interpretation None      MDM   Final diagnoses:  Fall   73 yo F hx of HTN, CVA (3 years prior, with R sided deficits, slurred speech), DMII, presents with CC fall, altered mental status.    Physical exam as above.  VS WNL, afebrile.  Labs demonstrate mild leukocytosis, mild hypokalemia 3.0, INR WNL, UA appears uninfected, XR R hip unremarkable for acute trauma.  CT head with no acute changes, however MRI brain demonstrates possible 3 mm midbrain infact.    Neurology and medicine consulted.  Question stroke vs delerium possibly 2/2  infection (given leukocytosis).  Pt to be admitted to medicine service for further evaluation and treatment.  Pt understands and agrees with plan.   Jon Gills  I've discussed pt with my attending Dr Jeraldine Loots.   Jon Gills, MD 10/27/14 [id number]  Gerhard Munch, MD 10/28/14 [phone]

## 2014-10-26 NOTE — ED Notes (Signed)
Per family, pt has not been acting herself since about 11 o'clock this morning.  Family sts pt fell at home and since then has not been acting her norm.  Tenderness to right hip palpated.  Family sts pt has not been able to walk since falling.  Pt CAO x 4 in triage.  Pt with some delayed responses.  No weakness noted. No neuro deficits noted.

## 2014-10-26 NOTE — ED Notes (Signed)
Patient returned from X-ray 

## 2014-10-26 NOTE — Consult Note (Signed)
Neurology Consultation Reason for Consult: Falls Referring Physician: Judeth Cornfield  CC: Falls, altered mental status  History is obtained from: Son  HPI: KALISHA KEADLE is a 74 y.o. female with a history of previous stroke causing right hemiparesis who initially had a lot of trouble walking after the stroke, but had subsequently improved a great deal presents with falls as well as altered mental status over the course of the day.   She was last in her normal state of health yesterday evening.   Over the course of the day he has been noticed that she was "not acting right" and that she fell several times of the course of the day. She was therefore brought to the ER for further evaluation where an MRI was obtained. This does show an area in the mid brain that could be concerning for an acute infarct, though this is not confirmed on coronal imaging.   LKW: 2/14 tpa given?: no, outside of window   ROS: A 14 point ROS was performed and is negative except as noted in the HPI.   Past Medical History  Diagnosis Date  . Diabetes mellitus without complication   . Hypertension   . Stroke   . Acute cholecystitis 08/10/2013    Lap chole on 08/13/13     Family History: No history of similar  Social History: Tob: Denies  Exam: Current vital signs: BP 102/57 mmHg  Pulse 117  Temp(Src) 98.4 F (36.9 C) (Oral)  Resp 15  Ht '5\' 5"'  (1.651 m)  Wt 73.029 kg (161 lb)  BMI 26.79 kg/m2  SpO2 96% Vital signs in last 24 hours: Temp:  [98.4 F (36.9 C)] 98.4 F (36.9 C) (02/15 2030) Pulse Rate:  [84-123] 117 (02/15 2130) Resp:  [14-26] 15 (02/15 2130) BP: (100-113)/(46-84) 102/57 mmHg (02/15 2130) SpO2:  [95 %-100 %] 96 % (02/15 2130) Weight:  [73.029 kg (161 lb)] 73.029 kg (161 lb) (02/15 1519)   Physical Exam  Constitutional: Appears well-developed and well-nourished.  Psych: Confrontational and disagreeable at times Eyes: No scleral injection HENT: No OP obstrucion Head:  Normocephalic.  Cardiovascular: Normal rate and regular rhythm.  Respiratory: Effort normal GI: Soft.  No distension. There is no tenderness.  Skin: WDI  Neuro: Mental Status: Patient is awake, alert, oriented to person, place, but gives the month only after first name December than January. She is unable to give the year.  She has no signs of aphasia or neglect  Cranial Nerves: II: Visual Fields are full. Pupils are equal, round, and reactive to light.   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  VIII: hearing is intact to voice X: Uvula elevates symmetrically XI: Shoulder shrug is symmetric. XII: tongue is midline without atrophy or fasciculations.  Motor: Tone is normal. Bulk is normal. 5/5 strength was present on the left side, she has 4+/5 strength in the right arm and 4/5 strength in the right leg  Sensory: Sensation is symmetric to light touch in the arms and legs. Plantars: Toes are downgoing bilaterally.  Cerebellar: FNF and HKS are intact on the left, on the right she has some mild difficulty proximally consistent with weakness in the right arm and leg.    I have reviewed labs in epic and the results pertinent to this consultation are: Leukocytosis   I have reviewed the images obtained: MRI brain-possible area of restricted diffusion that  could also be artifactual and is not confirmed on coronal imaging   Impression: 74 year old female presenting with increased falls and confusion in the setting of leukocytosis. No clear source of infection has been identified, nevertheless, I suspect that her worsening confusion and worsening of her old stroke deficits is likely due to some physiological stressor which is also responsible for leukocytosis.  She does not have any deficits referrable to the possible stroke seen on MRI, though it is reasonable to  confirm that her secondary stroke measures are being met, my suspicion is that this is artifactual. If it were a true stroke, then it would be small vessel in nature.  Recommendations: 1) A1c, Lipid panel 2) continue plavix 3) PT, OT 4) continue to search for source of leukocytosis.    Roland Rack, MD [facility] [phone]  If 7pm- 7am, please page neurology on call as listed in [place].

## 2014-10-26 NOTE — ED Notes (Signed)
CBG 96. 

## 2014-10-26 NOTE — H&P (Signed)
[facility] History and Physical  Tracy [name] WUJ:[id number] DOB: 1940-10-03 DOA: 10/26/2014  Referring physician: EDP PCP: Sonda Primes, MD   Chief Complaint: AMS   HPI: Tracy [name] is a 74 y.o. female with h/o HTN, CVA 3 years ago with chronic R sided deficits and slurred speech.  She is brought in today by her son due to loss of ballance today and confusion.  Patient had unsteady gait at hoe and had to be carried to truck by son secondary to being off balance.  Patient also had a mild frontal headache which has resolved.  Other deficits other than confusion and being off balance appear to be baseline per son.  Patient denies cough, fever, chills, skin rash, ulcer, abdominal pain, N/V, had diarrhea for past couple of weeks but this resolved recently she states.  Chest pain, pain anywhere.  Unfortunately it becomes clear during course of interview that patient is delirious, she wants to leave [facility] tomorrow because she "needs to go home and take care of her children".  She incorrectly states their ages as 84, 42, and 65, as her 50 year old son (who assures [place] that he and all other children are grown and able to take care of themselves) is sitting next to her.  Review of Systems: Systems reviewed.  As above, otherwise negative  Past Medical History  Diagnosis Date  . Diabetes mellitus without complication   . Hypertension   . Stroke   . Acute cholecystitis 08/10/2013    Lap chole on 08/13/13    Past Surgical History  Procedure Laterality Date  . Ercp N/A 08/12/2013    Procedure: ENDOSCOPIC RETROGRADE CHOLANGIOPANCREATOGRAPHY (ERCP);  Surgeon: Theda Belfast, MD;  Location: [facility] ENDOSCOPY;  Service: Endoscopy;  Laterality: N/A;  . Sphincterotomy  08/12/2013    Procedure: SPHINCTEROTOMY;  Surgeon: Theda Belfast, MD;  Location: [facility] ENDOSCOPY;  Service: Endoscopy;;  . Cholecystectomy N/A 08/13/2013    Procedure: LAPAROSCOPIC CHOLECYSTECTOMY ;  Surgeon: Currie Paris, MD;   Location[facility] OR;  Service: General;  Laterality: N/A;  . Incisional hernia repair N/A 08/20/2013    Procedure: HERNIA REPAIR INCISIONAL UMBILICAL;  Surgeon: Velora Heckler, MD;  Location: [facility] OR;  Service: General;  Laterality: N/A;   Social History:  reports that she has never smoked. She does not have any smokeless tobacco history on file. She reports that she does not drink alcohol or use illicit drugs.  Allergies  Allergen Reactions  . Wellbutrin [Bupropion] Other (See Comments)    Wt loss    History reviewed. No pertinent family history.   Prior to Admission medications   Medication Sig Start Date End Date Taking? Authorizing Provider  acetaminophen (TYLENOL) 325 MG tablet Take 650 mg by mouth every 6 (six) hours as needed for pain.   Yes Historical Provider, MD  clopidogrel (PLAVIX) 75 MG tablet Take 1 tablet (75 mg total) by mouth daily. 10/05/14  Yes Aleksei Plotnikov V, MD  loratadine (CLARITIN) 10 MG tablet Take 10 mg by mouth daily as needed for allergies.    Yes Historical Provider, MD  metFORMIN (GLUCOPHAGE) 500 MG tablet Take 2 tablets (1,000 mg total) by mouth 2 (two) times daily. 10/05/14  Yes Aleksei Plotnikov V, MD  PARoxetine (PAXIL) 10 MG tablet Take 1 tablet (10 mg total) by mouth daily. 10/05/14  Yes Aleksei Plotnikov V, MD  polyethylene glycol powder (GLYCOLAX/MIRALAX) powder Take 17 g by mouth 2 (two) times daily as needed. 02/03/14  Yes Aleksei  Plotnikov V, MD  spironolactone (ALDACTONE) 25 MG tablet Take 1 tablet (25 mg total) by mouth daily. 10/05/14  Yes Tresa Garter, MD   Physical Exam: Filed Vitals:   10/26/14 2130  BP: 102/57  Pulse: 117  Temp:   Resp: 15    BP 102/57 mmHg  Pulse 117  Temp(Src) 98.4 F (36.9 C) (Oral)  Resp 15  Ht  (1.651 m)  Wt 73.029 kg (161 lb)  BMI 26.79 kg/m2  SpO2 96%  General Appearance:    Alert, Not oriented to time or situation, incorrectly states the number of children she has, their names, and their ages (ages  58, 34 and 70 she states despite the fact that her 43 year old son is sitting right next to her), no distress, appears stated age  Head:    Normocephalic, atraumatic  Eyes:    PERRL, Dysconjugate gaze which has been baseline since birth per patient and son agrees is chronic (not new despite concern of midbrain stroke), sclera non-icteric        Nose:   Nares without drainage or epistaxis. Mucosa, turbinates normal  Throat:   Moist mucous membranes. Oropharynx without erythema or exudate.  Neck:   Supple. No carotid bruits.  No thyromegaly.  No lymphadenopathy.   Back:     No CVA tenderness, no spinal tenderness  Lungs:     Clear to auscultation bilaterally, without wheezes, rhonchi or rales  Chest wall:    No tenderness to palpitation  Heart:    Regular rate and rhythm without murmurs, gallops, rubs  Abdomen:     Soft, non-tender, nondistended, normal bowel sounds, no organomegaly  Genitalia:    deferred  Rectal:    deferred  Extremities:   No clubbing, cyanosis or edema.  Pulses:   2+ and symmetric all extremities  Skin:   Skin color, texture, turgor normal, no rashes or lesions  Lymph nodes:   Cervical, supraclavicular, and axillary nodes normal  Neurologic:   RUE and RLE weakness and dyscordination.  Slurred speech.  All of this is baseline per son.    Labs on Admission:  Basic Metabolic Panel:  Recent Labs Lab 10/26/14 1529  NA 138  K 3.0*  CL 102  CO2 31  GLUCOSE 108*  BUN 14  CREATININE 0.98  CALCIUM 8.5   Liver Function Tests:  Recent Labs Lab 10/26/14 1529  AST 16  ALT 9  ALKPHOS 58  BILITOT 1.3*  PROT 6.3  ALBUMIN 3.7   No results for input(s): LIPASE, AMYLASE in the last 168 hours. No results for input(s): AMMONIA in the last 168 hours. CBC:  Recent Labs Lab 10/26/14 1529  WBC 14.0*  HGB 10.3*  HCT 30.2*  MCV 89.3  PLT 302   Cardiac Enzymes: No results for input(s): CKTOTAL, CKMB, CKMBINDEX, TROPONINI in the last 168 hours.  BNP (last 3  results) No results for input(s): PROBNP in the last 8760 hours. CBG:  Recent Labs Lab 10/26/14 1829  GLUCAP 96    Radiological Exams on Admission: Ct Head Wo Contrast  10/26/2014   CLINICAL DATA:  74 year old female with altered level of consciousness. Fall. Pain in the forehead.  EXAM: CT HEAD WITHOUT CONTRAST  TECHNIQUE: Contiguous axial images were obtained from the base of the skull through the vertex without intravenous contrast.  COMPARISON:  PET-CT 07/29/2008.  FINDINGS: Multiple well-defined foci of low attenuation in the basal ganglia bilaterally, similar to the prior examination from 07/29/2008, compatible with old  lacunar infarcts. Faint physiologic calcifications in the right basal ganglia again noted. Patchy areas of mild decreased attenuation are noted throughout the deep and periventricular white matter of the cerebral hemispheres bilaterally, compatible with mild chronic microvascular ischemic disease. No acute intracranial abnormalities. Specifically, no evidence of acute intracranial hemorrhage, no definite findings of acute/subacute cerebral ischemia, no mass, mass effect, hydrocephalus or abnormal intra or extra-axial fluid collections. Visualized paranasal sinuses and mastoids are well pneumatized. No acute displaced skull fractures are identified.  IMPRESSION: 1. No acute intracranial abnormalities. 2. Mild chronic microvascular ischemic changes in the cerebral white matter and multiple old bilateral basal ganglia lacunar infarctions redemonstrated, as above.   Electronically Signed   By: Trudie Reedaniel  Entrikin M.D.   On: 10/26/2014 17:46   Mr Brain Wo Contrast  10/26/2014   CLINICAL DATA:  Fall.  Altered mental status.  Rule out stroke.  EXAM: MRI HEAD WITHOUT CONTRAST  TECHNIQUE: Multiplanar, multiecho pulse sequences of the brain and surrounding structures were obtained without intravenous contrast.  COMPARISON:  CT head 10/26/2014  FINDINGS: Possible small area of acute infarct in  the central midbrain. This cannot be confirmed on coronal diffusion-weighted imaging but does appear to have restricted diffusion measuring approximately 3 mm. No other acute infarct  Age-appropriate atrophy. Chronic infarcts in the basal ganglia bilaterally and left thalamus. Minimal chronic microvascular ischemia in the white matter.  Negative for hemorrhage or fluid collection  Negative for mass or edema.  IMPRESSION: Possible 3 mm acute infarct in the central midbrain, not confirmed on coronal diffusion.  Atrophy and chronic microvascular ischemia.   Electronically Signed   By: Marlan Palauharles  Clark M.D.   On: 10/26/2014 20:41   Dg Hip Unilat With Pelvis 2-3 Views Right  10/26/2014   CLINICAL DATA:  Status post fall was 3 days ago, difficulty walking now due to right hip pain  EXAM: RIGHT HIP (WITH PELVIS) 2-3 VIEWS  COMPARISON:  Coronal and sagittal reconstructed images from an abdominal and pelvic CT scan dated August 18, 2013.  FINDINGS: The bony pelvis is adequately mineralized. There are mild degenerative changes of the lower lumbar spine. The hip joint spaces are reasonably well maintained bilaterally. AP and lateral views of the right hip reveal the articular surfaces of the acetabulum and femoral head to be smoothly rounded. The femoral neck and intertrochanteric regions are normal. The soft tissues overlying the pelvis and right hip are unremarkable.  IMPRESSION: There is no acute or significant chronic bony abnormality of the pelvis or right hip. There is mild degenerative disc disease of the lower lumbar spine.   Electronically Signed   By: David  SwazilandJordan   On: 10/26/2014 16:16    EKG: Independently reviewed.  Assessment/Plan Principal Problem:   Delirium Active Problems:   HTN (hypertension)   History of CVA (cerebrovascular accident)   Dementia   Tachycardia   SIRS (systemic inflammatory response syndrome)   1. Delirium with SIRS - suspicious for occult infection somewhere, but unclear  where the SIRS is coming from at this point.  Has 2/4 SIRS criteria with WBC of 14k and mild tachycardia into the 110s in ED. 1. Delirium may be in part to sundowning as well, patients son says she chronically can get like this at times "if she misses her meds", but need to rule out a co-existing infection given the presence of SIRS. 2. Trend WBC 3. Tele monitor for tachycardia 4. MRI brain shows area in midbrain that may or may not be an acute  stroke.  See Dr. Alene Mires note.  The dysconjugate gaze we are told is chronic and not new. 5. IVF at 100 cc/hr 6. Holding spironolactone 7. ABx are considered, however, with only delirium, 2/4 SIRS criteria, and no symptoms to go on right now, will hold off on empiric ABx therapy. 1. Blood CX ordered 2. CXR ordered (though no symptoms or signs of PNA) 3. UA unremarkable 8. If patient becomes clinically worse, then would empirically start broad spectrum ABx.    Code Status: Full Code  Family Communication: Son at bedside, discussed the delirium with him and that patient cant really sign out to go home AMA alone (even though she says she is going to tomorrow) while she is still delirious like this. Disposition Plan: Admit to inpatient   Time spent: 70 min  Gladys Gutman M. [facility] Pager [phone]  If 7AM-7PM, please contact the day team taking care of the patient [email or web address] Password [facility] 10/26/2014, 10:15 PM

## 2014-10-26 NOTE — ED Notes (Signed)
Patient returned from CT. Phlebotomy at the bedside.  

## 2014-10-26 NOTE — ED Notes (Signed)
Admitting MD at the bedside.  

## 2014-10-27 ENCOUNTER — Encounter (HOSPITAL_COMMUNITY): Payer: Self-pay | Admitting: Radiology

## 2014-10-27 ENCOUNTER — Inpatient Hospital Stay (HOSPITAL_COMMUNITY): Payer: Medicare HMO

## 2014-10-27 DIAGNOSIS — I639 Cerebral infarction, unspecified: Secondary | ICD-10-CM | POA: Insufficient documentation

## 2014-10-27 DIAGNOSIS — E669 Obesity, unspecified: Secondary | ICD-10-CM

## 2014-10-27 DIAGNOSIS — E1159 Type 2 diabetes mellitus with other circulatory complications: Secondary | ICD-10-CM

## 2014-10-27 DIAGNOSIS — I10 Essential (primary) hypertension: Secondary | ICD-10-CM

## 2014-10-27 DIAGNOSIS — R079 Chest pain, unspecified: Secondary | ICD-10-CM

## 2014-10-27 DIAGNOSIS — E1169 Type 2 diabetes mellitus with other specified complication: Secondary | ICD-10-CM | POA: Insufficient documentation

## 2014-10-27 DIAGNOSIS — E119 Type 2 diabetes mellitus without complications: Secondary | ICD-10-CM | POA: Insufficient documentation

## 2014-10-27 LAB — CBC
HCT: 32 % — ABNORMAL LOW (ref 36.0–46.0)
Hemoglobin: 10.6 g/dL — ABNORMAL LOW (ref 12.0–15.0)
MCH: 29.2 pg (ref 26.0–34.0)
MCHC: 33.1 g/dL (ref 30.0–36.0)
MCV: 88.2 fL (ref 78.0–100.0)
Platelets: 255 10*3/uL (ref 150–400)
RBC: 3.63 MIL/uL — AB (ref 3.87–5.11)
RDW: 15 % (ref 11.5–15.5)
WBC: 13.5 10*3/uL — AB (ref 4.0–10.5)

## 2014-10-27 LAB — GLUCOSE, CAPILLARY
GLUCOSE-CAPILLARY: 79 mg/dL (ref 70–99)
GLUCOSE-CAPILLARY: 92 mg/dL (ref 70–99)
GLUCOSE-CAPILLARY: 85 mg/dL (ref 70–99)

## 2014-10-27 LAB — LIPID PANEL
CHOL/HDL RATIO: 2.9 ratio
CHOLESTEROL: 129 mg/dL (ref 0–200)
HDL: 45 mg/dL (ref >39)
LDL Cholesterol: 68 mg/dL (ref 0–99)
Triglycerides: 82 mg/dL (ref <150)
VLDL: 16 mg/dL (ref 0–40)

## 2014-10-27 LAB — BASIC METABOLIC PANEL
Anion gap: 10 (ref 5–15)
BUN: 15 mg/dL (ref 6–23)
CALCIUM: 8.2 mg/dL — AB (ref 8.4–10.5)
CHLORIDE: 109 mmol/L (ref 96–112)
CO2: 23 mmol/L (ref 19–32)
Creatinine, Ser: 0.97 mg/dL (ref 0.50–1.10)
GFR calc non Af Amer: 57 mL/min — ABNORMAL LOW (ref >90)
GFR, EST AFRICAN AMERICAN: 66 mL/min — AB (ref >90)
Glucose, Bld: 96 mg/dL (ref 70–99)
POTASSIUM: 3.6 mmol/L (ref 3.5–5.1)
SODIUM: 142 mmol/L (ref 135–145)

## 2014-10-27 MED ORDER — WHITE PETROLATUM GEL
Status: AC
  Administered 2014-10-27: 23:00:00
  Filled 2014-10-27: qty 1

## 2014-10-27 MED ORDER — INSULIN ASPART 100 UNIT/ML ~~LOC~~ SOLN: 0.0000 [IU] | Freq: Three times a day (TID) | SUBCUTANEOUS | Status: DC

## 2014-10-27 MED ORDER — PRAVASTATIN SODIUM 20 MG PO TABS
10.0000 mg | ORAL_TABLET | Freq: Every day | ORAL | Status: DC
  Administered 2014-10-27 – 2014-10-29 (×3): 10 mg via ORAL
  Filled 2014-10-27 (×3): qty 1

## 2014-10-27 MED ORDER — SODIUM CHLORIDE 0.9 % IV SOLN
Freq: Once | INTRAVENOUS | Status: AC
  Administered 2014-10-27: 17:00:00 via INTRAVENOUS

## 2014-10-27 MED ORDER — IOHEXOL 350 MG/ML SOLN
50.0000 mL | Freq: Once | INTRAVENOUS | Status: AC | PRN
  Administered 2014-10-27: 50 mL via INTRAVENOUS

## 2014-10-27 NOTE — Progress Notes (Signed)
PT Cancellation Note  Patient Details Name: Lavon PaganiniYvonne D Trautmann MRN: [id number] DOB: 10/22/1940   Cancelled Treatment:    Reason Eval/Treat Not Completed: Patient at procedure or test/unavailable Pt getting ECHO. Will follow up next available time.   Alvie HeidelbergFolan, Abyan Cadman A 10/27/2014, 4:06 PM Alvie HeidelbergShauna Folan, PT, DPT [phone]

## 2014-10-27 NOTE — Progress Notes (Signed)
Pt states she was stuck 3 times for IV without success. Refusing at this time for IV team to make any more attempts. Floor RN at bedside aware, to notify MD.

## 2014-10-27 NOTE — Progress Notes (Signed)
OT Cancellation Note  Patient Details Name: Tracy PaganiniYvonne D [name] MRN: [id number] DOB: 12/28/1940   Cancelled Treatment:    Reason Eval/Treat Not Completed: Patient at procedure or test/ unavailable  Angelene GiovanniConarpe, Jaysin Gayler M  Tran Randle Osceolaonarpe, OTR/L [phone]  10/27/2014, 4:30 PM

## 2014-10-27 NOTE — Progress Notes (Signed)
Nurse notified MD, Dr. Malachi BondsShort, that pt's heart rate in  130s-140s sitting at bedside. no acute distress noted, asymptomatic. Will continue to monitor pt closely.   Andrew AuVafiadis, Elza Sortor I 10/27/2014 5:02 PM

## 2014-10-27 NOTE — Progress Notes (Signed)
STROKE TEAM PROGRESS NOTE   HISTORY Tracy [name] is a 74 y.o. female with a history of previous stroke causing right hemiparesis who initially had a lot of trouble walking after the stroke, but had subsequently improved a great deal presents with falls as well as altered mental status over the course of the day.   She was last in her normal state of health yesterday evening.  Over the course of the day he has been noticed that she was "not acting right" and that she fell several times of the course of the day. She was therefore brought to the ER for further evaluation where an MRI was obtained. This does show an area in the mid brain that could be concerning for an acute infarct, though this is not confirmed on coronal imaging.   LKW: 2/14  Patient was not administered TPA secondary to being outside of TPA window. She was admitted to 4 N for further evaluation and treatment.  SUBJECTIVE (INTERVAL HISTORY) Her family is at the bedside. No issues overnight as per nursing staff. Overall she feels her condition has improved and she is ready to go home. Her family feels that she is still confused. She is not compliant with medication home.  OBJECTIVE Temp:  [98.2 F (36.8 C)-98.8 F (37.1 C)] 98.2 F (36.8 C) (02/16 0939) Pulse Rate:  [56-123] 60 (02/16 0939) Cardiac Rhythm:  [-] Sinus tachycardia (02/15 2200) Resp:  [14-26] 16 (02/16 0939) BP: (95-146)/(46-84) 146/76 mmHg (02/16 0939) SpO2:  [95 %-100 %] 100 % (02/16 0939) Weight:  [73.029 kg (161 lb)] 73.029 kg (161 lb) (02/15 1519)   Recent Labs Lab 10/26/14 1829 10/27/14 1120  GLUCAP 96 79    Recent Labs Lab 10/26/14 1529 10/27/14 0546  NA 138 142  K 3.0* 3.6  CL 102 109  CO2 31 23  GLUCOSE 108* 96  BUN 14 15  CREATININE 0.98 0.97  CALCIUM 8.5 8.2*    Recent Labs Lab 10/26/14 1529  AST 16  ALT 9  ALKPHOS 58  BILITOT 1.3*  PROT 6.3  ALBUMIN 3.7    Recent Labs Lab 10/26/14 1529 10/27/14 0546  WBC  14.0* 13.5*  HGB 10.3* 10.6*  HCT 30.2* 32.0*  MCV 89.3 88.2  PLT 302 255   No results for input(s): CKTOTAL, CKMB, CKMBINDEX, TROPONINI in the last 168 hours.  Recent Labs  10/26/14 1529  LABPROT 15.0  INR 1.16    Recent Labs  10/26/14 1829  COLORURINE AMBER*  LABSPEC 1.022  PHURINE 5.5  GLUCOSEU NEGATIVE  HGBUR TRACE*  BILIRUBINUR SMALL*  KETONESUR 15*  PROTEINUR NEGATIVE  UROBILINOGEN 1.0  NITRITE NEGATIVE  LEUKOCYTESUR NEGATIVE       Component Value Date/Time   CHOL 129 10/27/2014 0546   TRIG 82 10/27/2014 0546   HDL 45 10/27/2014 0546   CHOLHDL 2.9 10/27/2014 0546   VLDL 16 10/27/2014 0546   LDLCALC 68 10/27/2014 0546   Lab Results  Component Value Date   HGBA1C 6.1 10/05/2014   No results found for: LABOPIA, COCAINSCRNUR, LABBENZ, AMPHETMU, THCU, LABBARB  No results for input(s): ETH in the last 168 hours.  I have personally reviewed the radiological images below and agree with the radiology interpretations.  Dg Chest 2 View  10/26/2014   IMPRESSION: No acute disease.   Ct Head Wo Contrast  10/26/2014    IMPRESSION: 1. No acute intracranial abnormalities. 2. Mild chronic microvascular ischemic changes in the cerebral white matter and multiple old bilateral basal  ganglia lacunar infarctions redemonstrated, as above.      Mr Brain Wo Contrast  10/26/2014   IMPRESSION: Possible 3 mm acute infarct in the central midbrain, not confirmed on coronal diffusion.  Atrophy and chronic microvascular ischemia.   On my review of the MRI images, I think it is real that she had small midbrain stroke.    PHYSICAL EXAM Constitutional: Appears well-developed and well-nourished.  Psych: Confrontational and disagreeable at times Eyes: No scleral injection HENT: No OP obstrucion Head: Normocephalic.  Cardiovascular: Normal rate and regular rhythm.  Respiratory: Effort normal GI: Soft. No distension. There is no tenderness.  Skin: WDI  Neuro: Mental  Status: Patient is awake, alert, oriented to person, place, and year but not to month.She has no signs of aphasia or neglect, able to repeat and name. Very irritable. Continues to request to go home. Cranial Nerves: II: Visual Fields are full. Pupils are equal, round, and reactive to light.  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 V: Facial sensation is symmetric to temperature VII: Facial movement is symmetric.  VIII: hearing is intact to voice X: Uvula elevates symmetrically XI: Shoulder shrug is symmetric. XII: tongue is midline without atrophy or fasciculations.  Motor: Tone is normal. Bulk is normal. 5/5 strength was present on the left side, she has 5-/5 strength in the right arm and 4+/5 strength in the right leg  Sensory: Sensation is symmetric to light touch in the arms and legs. Plantars: Toes are downgoing bilaterally.  Cerebellar: FNF and HKS are intact on the left, on the right she has some slight difficulty proximally consistent with mild weakness in the right arm and leg. Gait Not tested as patient refused to cooperate  ASSESSMENT/PLAN [name] is a 74 y.o. female with history of left BG stroke in 2009 causing mild right hemiparesis, HTN, DMII presenting with recent falls and AMS. She did not receive IV t-PA due to being outside of TPA window.   Stroke: 3 mm acute infarct in the central midbrain, suspect small vessel disease as cause. Patient had left basal tenuous stroke in 2009, also consistent with small vessel disease. She is on Plavix at home, however she is not compliant with medication.  Resultant  no new deficit  MRI  Punctate midbrain stroke  CTA head and neck  pending  2D Echo Pending  LDL 68, at the goal  HgbA1c 6.1 on 10/05/14.  Heparin for VTE prophylaxis  Diet Carb Modified thin liquids  clopidogrel 75 mg orally every day prior to admission, continued on clopidogrel 75 mg  orally every day. Patient needs to comply with medication.  Patient counseled to be compliant with her antithrombotic medications  Ongoing aggressive stroke risk factor management  Therapy recommendations:  Pending  Disposition:  Pending  Hypertension  Home meds: Aldactone  Stable  Patient counseled to be compliant with her blood pressure medications  Hyperlipidemia  Home meds:  None  LDL 68, goal < 70  Add low-dose pravastatin for stroke prevention.  Diabetes  HgbA1c 6.1 10/05/14, goal < 7.0  Controlled  Continue metformin after CTA head and neck  Sliding insulin scale  Leukocytosis - source unclear  CXR neg  UA neg  Blood culture sent  Repeat CBC in am  Other Stroke Risk Factors  Advanced age  Hx stroke/TIA  Other Active Problems  Hypokalemia resolved  Chronic constipation continue MiraLAX  Normocytic anemia  Hospital day # 1  Patient  seen and discussed with Dr. Roda ShuttersXu.  Rudean HittJoseph A. Giordano, PA-C Department of Nuurology [facility]  I, the attending vascular neurologist, have personally obtained a history, examined the patient, evaluated laboratory data, individually viewed imaging studies and agree with radiology interpretations. I also obtained additional history from pt's daughter and sons at bedside. I also discussed with Dr. Malachi BondsShort regarding his care plan. Together with the NP/PA, we formulated the assessment and plan of care which reflects our mutual decision.  I have made any additions or clarifications directly to the above note and agree with the findings and plan as currently documented.   74 year old female with history of the hypertension, diabetes, left basal ganglia stroke in 2009 with mild right-sided deficit admitted for recent fall and confusion. MRI showed small midbrain infarct, consistent with small vessel disease. Patient not compliant with medication. LDL 68 and A1c 6.1. Recommend stroke workup with CTA head and neck, 2-D  echo. Patient will continue Plavix, at pravastatin for stroke prevention.  Marvel PlanJindong Dylen Mcelhannon, MD PhD Stroke Neurology 10/27/2014 2:42 PM       To contact Stroke Continuity provider, please refer to [email or web address]. After hours, contact General Neurology

## 2014-10-27 NOTE — Progress Notes (Signed)
Echocardiogram 2D Echocardiogram has been performed.  Tracy [name], [name] M 10/27/2014, 4:01 PM

## 2014-10-27 NOTE — Evaluation (Signed)
Physical Therapy Evaluation Patient Details Name: Tracy [name] MRN: [id number] DOB: 03/11/1941 Today's Date: 10/27/2014   History of Present Illness  Patient is a 74 y/o female with PMH of HTN, DM and CVA 3 years ago with chronic R sided deficits and slurred speech.Pt is brought in today by her son due to falls and confusion. MRI brain- Possible 3 mm acute infarct in the central midbrain, not confirmed. Pt admitted w/ delirium with SIRS.    Clinical Impression  Patient presents with functional limitations due to deficits listed in PT problem list (see below). Limited evaluation as pt lethargic and with elevated HR with minimal activity. Able to stand for a few seconds with Min A. Difficulty with bed mobility requiring Mod A and cues. Pt would benefit from further assessment of functional mobility to make appropriate discharge recommendations and maximize independence.     Follow Up Recommendations Other (comment) (TBD.)    Equipment Recommendations  None recommended by PT    Recommendations for Other Services       Precautions / Restrictions Precautions Precautions: Fall Restrictions Weight Bearing Restrictions: No      Mobility  Bed Mobility Overal bed mobility: Needs Assistance Bed Mobility: Supine to Sit;Sit to Supine     Supine to sit: Mod assist;HOB elevated;Max assist     General bed mobility comments: Mod A to elevate trunk with pt leaning left. Difficult to maintain upright. Mod A to bring BLEs into bed to return to supine.   Transfers Overall transfer level: Needs assistance Equipment used: 1 person hand held assist Transfers: Sit to/from Stand Sit to Stand: Min assist         General transfer comment: Min A to stand from EOB with forward trunk lean. Not able to stand fully upright. Pt lethargic. Elevated HR to 145 bpm so returned to supine.  Ambulation/Gait Ambulation/Gait assistance:  (Deferred due to elevated HR.)              Stairs             Wheelchair Mobility    Modified Rankin (Stroke Patients Only)       Balance Overall balance assessment: Needs assistance Sitting-balance support: Feet supported;Single extremity supported Sitting balance-Leahy Scale: Poor Sitting balance - Comments: Requires Min-Mod A for static sitting balance iniitally progressing to min guard assist. Leaning to the left Postural control: Left lateral lean Standing balance support: During functional activity Standing balance-Leahy Scale: Poor Standing balance comment: requires hand held assist for static standing for short period.                             Pertinent Vitals/Pain Pain Assessment: No/denies pain    Home Living Family/patient expects to be discharged to:: Private residence Living Arrangements: Spouse/significant other;Children Available Help at Discharge: Family;Available 24 hours/day Type of Home: House Home Access: Stairs to enter Entrance Stairs-Rails: Right Entrance Stairs-Number of Steps: 4 Home Layout: One level Home Equipment: Cane - single point;Walker - 2 wheels;Bedside commode Additional Comments: per prior notes, pt lives with husband who is blind.  History and PLOF obtained from son as pt not able to provide correct information.    Prior Function Level of Independence: Independent with assistive device(s)         Comments: Pt uses RW vs SPC for ambulation. Reports a few falls on Sunday. Family assists with IADls.     Hand Dominance   Dominant Hand: Right  Extremity/Trunk Assessment   Upper Extremity Assessment: Defer to OT evaluation           Lower Extremity Assessment: Generalized weakness;Difficult to assess due to impaired cognition         Communication   Communication: HOH;Expressive difficulties  Cognition Arousal/Alertness: Lethargic Behavior During Therapy: WFL for tasks assessed/performed Overall Cognitive Status: Impaired/Different from baseline Area of  Impairment: Orientation;Safety/judgement;Problem solving Orientation Level: Disoriented to;Place;Time;Situation (even with cues - not able to problem solve date,  or place.)       Safety/Judgement: Decreased awareness of deficits   Problem Solving: Slow processing;Decreased initiation;Difficulty sequencing;Requires verbal cues;Requires tactile cues      General Comments      Exercises        Assessment/Plan    PT Assessment Patient needs continued PT services  PT Diagnosis Generalized weakness   PT Problem List Decreased strength;Cardiopulmonary status limiting activity;Decreased cognition;Decreased activity tolerance;Decreased mobility;Decreased balance;Decreased safety awareness  PT Treatment Interventions Balance training;Gait training;Patient/family education;Functional mobility training;Therapeutic activities;Therapeutic exercise;Stair training;Neuromuscular re-education   PT Goals (Current goals can be found in the Care Plan section) Acute Rehab PT Goals Patient Stated Goal: none stated PT Goal Formulation: Patient unable to participate in goal setting Time For Goal Achievement: 11/10/14 Potential to Achieve Goals: Fair    Frequency Min 3X/week   Barriers to discharge        Co-evaluation               End of Session Equipment Utilized During Treatment: Gait belt Activity Tolerance: Treatment limited secondary to medical complications (Comment) (elevated HR) Patient left: in bed;with call bell/phone within reach;with bed alarm set;with nursing/sitter in room;with family/visitor present Nurse Communication: Mobility status;Other (comment) (HR.)         Time: [id number] PT Time Calculation (min) (ACUTE ONLY): 14 min   Charges:   PT Evaluation $Initial PT Evaluation Tier I: 1 Procedure     PT G CodesAlvie Heidelberg:        Folan, Harper Smoker A 10/27/2014, 5:19 PM  Alvie HeidelbergShauna Folan, PT, DPT [phone]

## 2014-10-27 NOTE — Progress Notes (Addendum)
[facility] PROGRESS NOTE  Tracy [name] ZOX:[id number] DOB: Jun 07, 1941 DOA: 10/26/2014 PCP: Sonda Primes, MD  Brief summary  The patient is a 74 year old female with history of hypertension, CVA 3 years ago with chronic right-sided deficits, slurred speech. She was brought in by her son due to loss of balance and increased confusion. She also complained of a mild frontal headache. In the emergency department, she denied cough, fever, chills, skin rash, ulcer, abdominal pain, nausea, vomiting. She had diarrhea for the past couple of weeks prior to admission but states that it was resolved before coming to the hospital. She was delirious in the emergency department and kept wanting to leave AGAINST MEDICAL ADVICE to take care of her children who are grown up but who she claims were 9, 24, and 74 years old.  No obvious source of infection was identified. There was some question of a possible stroke initially and upon further review on 2/16, the neurologist feels that she probably did have a small stroke. Therefore, she is undergoing the complete stroke workup. Instead of MRA brain and carotid duplex, we have ordered a CT angiogram of the head and neck as we do not feel it patient will be able to lie still for a full MRA and she is anxious to leave.  Assessment/Plan  Delirium superimposed on dementia secondary to possible occult infection, unclear source.  Afebrile, WBC trending down somewhat, but still elevated.  No clear signs of infection on exam, although she has poor dentition. I suspect that she may have been somewhat dehydrated.  Per son she is at her baseline mentation currently. -  Continued IV fluids -  Repeat CBC -  Follow blood cultures -  Chest x-ray negative -  Urinalysis unremarkable -  Continue observation until tomorrow. If she does have an underlying infection that she get worse and she should show signs, otherwise it may resolve spontaneously.  Possible small acute  stroke, although likely artifiact -  Follow-up A1c -  Follow-up Lipid panel   -  Continue Plavix  -  PT/OT  -  Appreciate neurology assistance -  CT angiogram head and neck -  Echocardiogram  Diabetes mellitus  -  Discontinue metformin and start sliding scale insulin -  Follow-up hemoglobin A1c  Hypertension, blood pressures initially low but rising -  Continue to hold spironolactone until overall trend in blood pressure as determined  Hypokalemia, resolved  Chronic constipation, continue MiraLAX  Normocytic anemia, likely anemia of chronic disease and hemoglobin approximately stable from baseline.  Diet:  Diabetic access:  PIV IVF:  Yesph:  Heparin   Code Status: Full Family Communication: Spoke with patient and her son Disposition Plan: Discharge tomorrow if she does not develop fevers and appropriate blood cell count is trending down, after completion of physical and occupational therapy assessments  Consultants:  Neurology   Procedures:  X-ray hips    CT head  Chest x-ray  MRI brain  Antibiotics:  None   HPI/Subjective:  Denies headache, tooth pain, sinus congestion, chest pain, Tracy [name] of breath, cough, nausea, abdominal pain, dysuria, diarrhea, skin infection    Objective: Filed Vitals:   10/27/14 0400 10/27/14 0600 10/27/14 0854 10/27/14 0939  BP: 146/76  Pulse: 95 100 56 60  Temp: 98.6 F (37 C) 98.6 F (37 C) 98.6 F (37 C) 98.2 F (36.8 C)  TempSrc: Oral Oral Oral Oral  Resp: Height:      Weight:  SpO2: 97% 97%  100%    Intake/Output Summary (Last 24 hours) at 10/27/14 1150 Last data filed at 10/26/14 1834  Gross per 24 hour  Intake      0 ml  Output    200 ml  Net   -200 ml   Filed Weights   10/26/14 1519  Weight: 73.029 kg (161 lb)    Exam:   General:  Overweight female, No acute distress  HEENT:  NCAT, MMM, poor dentition, esotropia  Cardiovascular:  RRR, nl S1, S2 no mrg, 2+ pulses,  warm extremities  Respiratory:  CTAB, no increased WOB  Abdomen:   NABS, soft, NT/ND  MSK:   Normal tone and bulk, no LEE  Neuro:  Grossly intact, strength and sensation intact, no facial droop  Psych: Alert and oriented to person and place but not time. She states that her children are 64 and 7 even though they are adults.  Data Reviewed: Basic Metabolic Panel:  Recent Labs Lab 10/26/14 1529 10/27/14 0546  NA 138 142  K 3.0* 3.6  CL 102 109  CO2 31 23  GLUCOSE 108* 96  BUN 14 15  CREATININE 0.98 0.97  CALCIUM 8.5 8.2*   Liver Function Tests:  Recent Labs Lab 10/26/14 1529  AST 16  ALT 9  ALKPHOS 58  BILITOT 1.3*  PROT 6.3  ALBUMIN 3.7   No results for input(s): LIPASE, AMYLASE in the last 168 hours. No results for input(s): AMMONIA in the last 168 hours. CBC:  Recent Labs Lab 10/26/14 1529 10/27/14 0546  WBC 14.0* 13.5*  HGB 10.3* 10.6*  HCT 30.2* 32.0*  MCV 89.3 88.2  PLT 302 255   Cardiac Enzymes: No results for input(s): CKTOTAL, CKMB, CKMBINDEX, TROPONINI in the last 168 hours. BNP (last 3 results) No results for input(s): BNP in the last 8760 hours.  ProBNP (last 3 results) No results for input(s): PROBNP in the last 8760 hours.  CBG:  Recent Labs Lab 10/26/14 1829  GLUCAP 96    No results found for this or any previous visit (from the past 240 hour(s)).   Studies: Dg Chest 2 View  10/26/2014   CLINICAL DATA:  Systemic inflammatory response syndrome. Shortness of breath.  EXAM: CHEST  2 VIEW  COMPARISON:  PA and lateral chest 07/29/2008.  CT chest 07/20/2008.  FINDINGS: The lungs are clear. Heart size is upper normal. No pneumothorax or pleural effusion.  IMPRESSION: No acute disease.   Electronically Signed   By: Drusilla Kanner M.D.   On: 10/26/2014 22:45   Ct Head Wo Contrast  10/26/2014   CLINICAL DATA:  74 year old female with altered level of consciousness. Fall. Pain in the forehead.  EXAM: CT HEAD WITHOUT CONTRAST  TECHNIQUE:  Contiguous axial images were obtained from the base of the skull through the vertex without intravenous contrast.  COMPARISON:  PET-CT 07/29/2008.  FINDINGS: Multiple well-defined foci of low attenuation in the basal ganglia bilaterally, similar to the prior examination from 07/29/2008, compatible with old lacunar infarcts. Faint physiologic calcifications in the right basal ganglia again noted. Patchy areas of mild decreased attenuation are noted throughout the deep and periventricular white matter of the cerebral hemispheres bilaterally, compatible with mild chronic microvascular ischemic disease. No acute intracranial abnormalities. Specifically, no evidence of acute intracranial hemorrhage, no definite findings of acute/subacute cerebral ischemia, no mass, mass effect, hydrocephalus or abnormal intra or extra-axial fluid collections. Visualized paranasal sinuses and mastoids are well pneumatized. No acute displaced skull fractures are identified.  IMPRESSION: 1. No acute intracranial abnormalities. 2. Mild chronic microvascular ischemic changes in the cerebral white matter and multiple old bilateral basal ganglia lacunar infarctions redemonstrated, as above.   Electronically Signed   By: Trudie Reedaniel  Entrikin M.D.   On: 10/26/2014 17:46   Mr Brain Wo Contrast  10/26/2014   CLINICAL DATA:  Fall.  Altered mental status.  Rule out stroke.  EXAM: MRI HEAD WITHOUT CONTRAST  TECHNIQUE: Multiplanar, multiecho pulse sequences of the brain and surrounding structures were obtained without intravenous contrast.  COMPARISON:  CT head 10/26/2014  FINDINGS: Possible small area of acute infarct in the central midbrain. This cannot be confirmed on coronal diffusion-weighted imaging but does appear to have restricted diffusion measuring approximately 3 mm. No other acute infarct  Age-appropriate atrophy. Chronic infarcts in the basal ganglia bilaterally and left thalamus. Minimal chronic microvascular ischemia in the white matter.   Negative for hemorrhage or fluid collection  Negative for mass or edema.  IMPRESSION: Possible 3 mm acute infarct in the central midbrain, not confirmed on coronal diffusion.  Atrophy and chronic microvascular ischemia.   Electronically Signed   By: Marlan Palauharles  Clark M.D.   On: 10/26/2014 20:41   Dg Hip Unilat With Pelvis 2-3 Views Right  10/26/2014   CLINICAL DATA:  Status post fall was 3 days ago, difficulty walking now due to right hip pain  EXAM: RIGHT HIP (WITH PELVIS) 2-3 VIEWS  COMPARISON:  Coronal and sagittal reconstructed images from an abdominal and pelvic CT scan dated August 18, 2013.  FINDINGS: The bony pelvis is adequately mineralized. There are mild degenerative changes of the lower lumbar spine. The hip joint spaces are reasonably well maintained bilaterally. AP and lateral views of the right hip reveal the articular surfaces of the acetabulum and femoral head to be smoothly rounded. The femoral neck and intertrochanteric regions are normal. The soft tissues overlying the pelvis and right hip are unremarkable.  IMPRESSION: There is no acute or significant chronic bony abnormality of the pelvis or right hip. There is mild degenerative disc disease of the lower lumbar spine.   Electronically Signed   By: David  SwazilandJordan   On: 10/26/2014 16:16    Scheduled Meds: . clopidogrel  75 mg Oral Daily  . heparin  5,000 Units Subcutaneous 3 times per day  . metFORMIN  1,000 mg Oral BID WC  . PARoxetine  10 mg Oral Daily  . potassium chloride  40 mEq Oral BID  . pravastatin  10 mg Oral q1800  . sodium chloride  3 mL Intravenous Q12H   Continuous Infusions: . sodium chloride 100 mL/hr at 10/26/14 2257    Principal Problem:   Delirium Active Problems:   HTN (hypertension)   History of CVA (cerebrovascular accident)   Dementia   Tachycardia   SIRS (systemic inflammatory response syndrome)    Time spent: 30 min    Tracy [name], [facility] ParkMACKENZIE  [facility] Pager [phone]. If 7PM-7AM,  please contact night-coverage at [email or web address], password [email or web address] 10/27/2014, 11:50 AM  LOS: 1 day

## 2014-10-27 NOTE — Progress Notes (Signed)
Nurse notified MD, Dr. Malachi BondsShort that IV infiltrated. pt difficult stick per IV team and pt refused further attempts for IV insertion. Will continue to monitor pt.   Andrew AuVafiadis, Sheccid Lahmann I 10/27/2014 12:39 PM

## 2014-10-28 ENCOUNTER — Inpatient Hospital Stay (HOSPITAL_COMMUNITY): Payer: Medicare HMO

## 2014-10-28 DIAGNOSIS — R509 Fever, unspecified: Secondary | ICD-10-CM

## 2014-10-28 LAB — HEMOGLOBIN A1C
Hgb A1c MFr Bld: 5.9 % — ABNORMAL HIGH (ref 4.8–5.6)
Mean Plasma Glucose: 123 mg/dL

## 2014-10-28 LAB — URINALYSIS, ROUTINE W REFLEX MICROSCOPIC
GLUCOSE, UA: NEGATIVE mg/dL
Ketones, ur: 40 mg/dL — AB
Nitrite: NEGATIVE
PH: 5.5 (ref 5.0–8.0)
PROTEIN: 30 mg/dL — AB
SPECIFIC GRAVITY, URINE: 1.028 (ref 1.005–1.030)
Urobilinogen, UA: 1 mg/dL (ref 0.0–1.0)

## 2014-10-28 LAB — CBC
HEMATOCRIT: 27.8 % — AB (ref 36.0–46.0)
Hemoglobin: 9.4 g/dL — ABNORMAL LOW (ref 12.0–15.0)
MCH: 29.8 pg (ref 26.0–34.0)
MCHC: 33.8 g/dL (ref 30.0–36.0)
MCV: 88.3 fL (ref 78.0–100.0)
PLATELETS: 272 10*3/uL (ref 150–400)
RBC: 3.15 MIL/uL — ABNORMAL LOW (ref 3.87–5.11)
RDW: 15 % (ref 11.5–15.5)
WBC: 16.4 10*3/uL — ABNORMAL HIGH (ref 4.0–10.5)

## 2014-10-28 LAB — URINE MICROSCOPIC-ADD ON

## 2014-10-28 LAB — RETICULOCYTES
RBC.: 3.36 MIL/uL — AB (ref 3.87–5.11)
RETIC COUNT ABSOLUTE: 50.4 10*3/uL (ref 19.0–186.0)
Retic Ct Pct: 1.5 % (ref 0.4–3.1)

## 2014-10-28 LAB — BASIC METABOLIC PANEL
Anion gap: 9 (ref 5–15)
BUN: 12 mg/dL (ref 6–23)
CHLORIDE: 108 mmol/L (ref 96–112)
CO2: 20 mmol/L (ref 19–32)
Calcium: 8 mg/dL — ABNORMAL LOW (ref 8.4–10.5)
Creatinine, Ser: 1.09 mg/dL (ref 0.50–1.10)
GFR calc Af Amer: 57 mL/min — ABNORMAL LOW (ref >90)
GFR calc non Af Amer: 49 mL/min — ABNORMAL LOW (ref >90)
GLUCOSE: 83 mg/dL (ref 70–99)
POTASSIUM: 4 mmol/L (ref 3.5–5.1)
SODIUM: 137 mmol/L (ref 135–145)

## 2014-10-28 LAB — PROCALCITONIN: Procalcitonin: 0.86 ng/mL

## 2014-10-28 LAB — GLUCOSE, CAPILLARY
GLUCOSE-CAPILLARY: 72 mg/dL (ref 70–99)
GLUCOSE-CAPILLARY: 79 mg/dL (ref 70–99)
Glucose-Capillary: 74 mg/dL (ref 70–99)

## 2014-10-28 LAB — TSH: TSH: 4.305 u[IU]/mL (ref 0.350–4.500)

## 2014-10-28 MED ORDER — SODIUM CHLORIDE 0.9 % IV BOLUS (SEPSIS)
500.0000 mL | Freq: Once | INTRAVENOUS | Status: AC
  Administered 2014-10-28: 500 mL via INTRAVENOUS

## 2014-10-28 NOTE — Progress Notes (Signed)
Physical Therapy Treatment Patient Details Name: Tracy [name] MRN: [id number] DOB: 08-09-1941 Today's Date: 10/28/2014    History of Present Illness Patient is a 74 y/o female with PMH of HTN, DM and CVA 3 years ago with chronic R sided deficits and slurred speech.Pt is brought in today by her son due to falls and confusion. MRI brain- Possible 3 mm acute infarct in the central midbrain, not confirmed. Pt admitted w/ delirium with SIRS.    PT Comments    Patient progressing slowly with mobility. Continues to be confused. Requires Min A for transfers and ambulation due to RLE weakness, balance deficits and being easily distracted in environment. Pt requires use of RW for ambulation for safety. Not using one PTA. Pt will need hands on assist and 24/7 supervision if pt decides to discharge home. Pt is not functioning at baseline at this time and would benefit from [facility] to improve transfers, gait, balance and mobility so pt can maximize independence and ease burden of care prior to return home.   Follow Up Recommendations  SNF;Supervision/Assistance - 24 hour     Equipment Recommendations  None recommended by PT    Recommendations for Other Services OT consult     Precautions / Restrictions Precautions Precautions: Fall Restrictions Weight Bearing Restrictions: No    Mobility  Bed Mobility Overal bed mobility: Needs Assistance Bed Mobility: Supine to Sit;Sit to Supine     Supine to sit: Mod assist;HOB elevated Sit to supine: Mod assist;HOB elevated   General bed mobility comments: Mod A to elevate trunk with pt leaning posteriorly and left. Difficult to sit upright without constant verbal cues. Poor trunk control/weakness resulting in LOB posteriorly upon sitting EOB.  Transfers Overall transfer level: Needs assistance Equipment used: None;Rolling walker (2 wheeled) Transfers: Sit to/from Stand Sit to Stand: Min assist         General transfer comment: Min A to  stand from EOB with cues for anterior translation and hip extension. Stood from toilet x1. Min A to rise.  Ambulation/Gait Ambulation/Gait assistance: Min assist Ambulation Distance (Feet): 125 Feet Assistive device: Rolling walker (2 wheeled) Gait Pattern/deviations: Step-through pattern;Decreased dorsiflexion - right;Shuffle;Decreased step length - left   Gait velocity interpretation: Below normal speed for age/gender General Gait Details: Veers right on multiple occasions  bumping into walls/doorways. Easily distracted. Dragging RLE during swing phase requiring manual cues to facilitate hip flexion. Furniture walker within room without RW.   Stairs            Wheelchair Mobility    Modified Rankin (Stroke Patients Only)       Balance Overall balance assessment: Needs assistance Sitting-balance support: Feet supported;No upper extremity supported Sitting balance-Leahy Scale: Poor Sitting balance - Comments: Complete LOB posteriorly when sitting EOB on multiple occasions. Able to sit EOB with constant verbal cues to stay upright. Postural control: Posterior lean Standing balance support: During functional activity Standing balance-Leahy Scale: Poor                      Cognition Arousal/Alertness: Awake/alert Behavior During Therapy: WFL for tasks assessed/performed Overall Cognitive Status: Impaired/Different from baseline Area of Impairment: Orientation;Safety/judgement Orientation Level: Disoriented to;Place;Time;Situation       Safety/Judgement: Decreased awareness of deficits;Decreased awareness of safety     General Comments: Pt very confused, "How long do I have to stay here? I want to go up stairs to the card room."    Exercises Other Exercises Other Exercises: Sit to/from  stand x4 from EOB emphasizing eccentric quad control during descent    General Comments        Pertinent Vitals/Pain Pain Assessment: Faces Faces Pain Scale: Hurts whole  lot Pain Location: bottom when sitting EOB Pain Descriptors / Indicators: Sore Pain Intervention(s): Repositioned;Monitored during session    Home Living                      Prior Function            PT Goals (current goals can now be found in the care plan section) Progress towards PT goals: Progressing toward goals    Frequency  Min 3X/week    PT Plan Discharge plan needs to be updated    Co-evaluation             End of Session Equipment Utilized During Treatment: Gait belt Activity Tolerance: Patient tolerated treatment well Patient left: in bed;with call bell/phone within reach;with bed alarm set     Time: 1521-1545 PT Time Calculation (min) (ACUTE ONLY): 24 min  Charges:  $Gait Training: 8-22 mins $Therapeutic Activity: 8-22 mins                    G CodesAlvie Heidelberg:      Folan, Keysean Savino A 10/28/2014, 3:57 PM  Alvie HeidelbergShauna Folan, PT, DPT [phone]

## 2014-10-28 NOTE — Progress Notes (Addendum)
STROKE TEAM PROGRESS NOTE   HISTORY Tracy [name] is a 74 y.o. female with a history of previous stroke causing right hemiparesis who initially had a lot of trouble walking after the stroke, but had subsequently improved a great deal presents with falls as well as altered mental status over the course of the day.   She was last in her normal state of health yesterday evening.  Over the course of the day he has been noticed that she was "not acting right" and that she fell several times of the course of the day. She was therefore brought to the ER for further evaluation where an MRI was obtained. This does show an area in the mid brain that could be concerning for an acute infarct, though this is not confirmed on coronal imaging.   LKW: 2/14  Patient was not administered TPA secondary to being outside of TPA window. She was admitted to 4 [facility] for further evaluation and treatment.    SUBJECTIVE (INTERVAL HISTORY) Her family is at the bedside. Overall she feels her condition has improved and she is ready to go home. Son is at the bedside and feels that she is getting better. Overnight: No issues as per nursing staff and patient.  OBJECTIVE Temp:  [97.8 F (36.6 C)-102 F (38.9 C)] 97.8 F (36.6 C) (02/17 0955) Pulse Rate:  [70-126] 89 (02/17 0955) Cardiac Rhythm:  [-] Sinus tachycardia (02/17 0815) Resp:  [16-18] 18 (02/17 0500) BP: (79-110)/(43-79) 93/49 mmHg (02/17 0955) SpO2:  [96 %-100 %] 100 % (02/17 0955)   Recent Labs Lab 10/26/14 1829 10/27/14 1120 10/27/14 1817 10/27/14 2304 10/28/14 0652  GLUCAP 96 79 92 85 72    Recent Labs Lab 10/26/14 1529 10/27/14 0546 10/28/14 0628  NA 138 142 137  K 3.0* 3.6 4.0  CL 102 109 108  CO2 GLUCOSE 108* 96 83  BUN CREATININE 0.98 0.97 1.09  CALCIUM 8.5 8.2* 8.0*    Recent Labs Lab 10/26/14 1529  AST 16  ALT 9  ALKPHOS 58  BILITOT 1.3*  PROT 6.3  ALBUMIN 3.7    Recent Labs Lab  10/26/14 1529 10/27/14 0546 10/28/14 0628  WBC 14.0* 13.5* 16.4*  HGB 10.3* 10.6* 9.4*  HCT 30.2* 32.0* 27.8*  MCV 89.3 88.2 88.3  PLT 302 255 272   No results for input(s): CKTOTAL, CKMB, CKMBINDEX, TROPONINI in the last 168 hours.  Recent Labs  10/26/14 1529  LABPROT 15.0  INR 1.16    Recent Labs  10/26/14 1829  COLORURINE AMBER*  LABSPEC 1.022  PHURINE 5.5  GLUCOSEU NEGATIVE  HGBUR TRACE*  BILIRUBINUR SMALL*  KETONESUR 15*  PROTEINUR NEGATIVE  UROBILINOGEN 1.0  NITRITE NEGATIVE  LEUKOCYTESUR NEGATIVE       Component Value Date/Time   CHOL 129 10/27/2014 0546   TRIG 82 10/27/2014 0546   HDL 45 10/27/2014 0546   CHOLHDL 2.9 10/27/2014 0546   VLDL 16 10/27/2014 0546   LDLCALC 68 10/27/2014 0546   Lab Results  Component Value Date   HGBA1C 5.9* 10/26/2014   No results found for: LABOPIA, COCAINSCRNUR, LABBENZ, AMPHETMU, THCU, LABBARB  No results for input(s): ETH in the last 168 hours.  I have personally reviewed the radiological images below and agree with the radiology interpretations.  Dg Chest 2 View  10/26/2014   IMPRESSION: No acute disease.   Ct Head Wo Contrast  10/26/2014    IMPRESSION: 1. No acute intracranial abnormalities. 2.  Mild chronic microvascular ischemic changes in the cerebral white matter and multiple old bilateral basal ganglia lacunar infarctions redemonstrated, as above.      Mr Brain Wo Contrast  10/26/2014   IMPRESSION: Possible 3 mm acute infarct in the central midbrain, not confirmed on coronal diffusion.  Atrophy and chronic microvascular ischemia.    CTA head and neck: No extracranial or intracranial flow reducing lesion. No evidence for dissection acute intracranial abnormality.  2D echo - - Left ventricle: The cavity size was normal. Wall thickness was increased in a pattern of moderate LVH. Systolic function was normal. The estimated ejection fraction was in the range of 60% to 65%. Wall motion was normal; there  were no regional wall motion abnormalities. Doppler parameters are consistent with abnormal left ventricular relaxation (grade 1 diastolic dysfunction).  PHYSICAL EXAM Constitutional: Appears well-developed and well-nourished.  Psych: Confrontational and disagreeable at times Eyes: No scleral injection HENT: No OP obstrucion Head: Normocephalic.  Cardiovascular: Normal rate and regular rhythm.  Respiratory: Effort normal GI: Soft. No distension. There is no tenderness.  Skin: WDI  Neuro: Mental Status: Patient is awake, alert, oriented to person, place, and year but not to month.She has no signs of aphasia or neglect, able to repeat and name. Very irritable. Continues to request to go home. Cranial Nerves: II: Visual Fields are full. Pupils are equal, round, and reactive to light.  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 V: Facial sensation is symmetric to temperature VII: Facial movement is symmetric.  VIII: hearing is intact to voice X: Uvula elevates symmetrically XI: Shoulder shrug is symmetric. XII: tongue is midline without atrophy or fasciculations.  Motor: Tone is normal. Bulk is normal. 5/5 strength was present on the left side, she has 5-/5 strength in the right arm and 4+/5 strength in the right leg  Sensory: Sensation is symmetric to light touch in the arms and legs. Plantars: Toes are downgoing bilaterally.  Cerebellar: FNF and HKS are intact on the left, on the right she has some slight difficulty proximally consistent with mild weakness in the right arm and leg. Gait Not tested as patient refused to cooperate  ASSESSMENT/PLAN [name] is a 74 y.o. female with history of left BG stroke in 2009 causing mild right hemiparesis, HTN, DMII presenting with recent falls and AMS. She did not receive IV t-PA due to being outside of TPA window.   Stroke: 3 mm acute infarct in the central  midbrain, suspect small vessel disease as cause. Patient had left basal tenuous stroke in 2009, also consistent with small vessel disease. She is on Plavix at home, however she is not compliant with medication.  Resultant  no new deficit  MRI  Punctate midbrain stroke  CTA head and neck negative  2D Echo: EF 55-60 % no clot, no shunt  LDL 68, at the goal  HgbA1c 5.9.  Heparin for VTE prophylaxis  Diet Carb Modified thin liquids  clopidogrel 75 mg orally every day prior to admission, continued on clopidogrel 75 mg orally every day. Patient needs to comply with medication.  Patient counseled to be compliant with her antithrombotic medications  Ongoing aggressive stroke risk factor management  Therapy recommendations:  No therapy needed  Disposition: Home  Hypertension  Home meds: Aldactone  Stable  Patient counseled to be compliant with her blood pressure medications  Hyperlipidemia  Home meds:  None  LDL 68, goal < 70  Add low-dose  pravastatin for stroke prevention.  Diabetes  HgbA1c 5.9, goal < 7.0  Controlled  Continue metformin after CTA head and neck  Sliding insulin scale  Leukocytosis - source unclear  CXR neg  UA neg  Blood culture sent  Continued leukocytosis 13.5-> 16.4  Other Stroke Risk Factors  Advanced age  Hx stroke/TIA  Other Active Problems  Hypokalemia resolved  Chronic constipation continue MiraLAX  Normocytic anemia  Hospital day # 2  Patient seen and discussed with Dr. Roda Shutters.  F/U with stroke clinic in 2 months.  Rudean Hitt, PA-C Department of Nuurology [facility] 10/28/2014 11:27 AM  I, the attending vascular neurologist, have personally obtained a history, examined the patient, evaluated laboratory data, individually viewed imaging studies and agree with radiology interpretations.  Together with the NP/PA, we formulated the assessment and plan of care which reflects our mutual decision.  I have  made any additions or clarifications directly to the above note and agree with the findings and plan as currently documented.   74 year old female with history of the hypertension, diabetes, left basal ganglia stroke in 2009 with mild right-sided deficit admitted for recent fall and confusion. MRI showed small midbrain infarct, consistent with small vessel disease. Patient not compliant with medication. LDL 68 and A1c 5.9. CTA head and neck and 2-D echo unremarkable. Patient will continue Plavix, add pravastatin for stroke prevention. Still has leukocytosis, unclear etiology.  Neurology will sign off. Please call with questions. Pt will follow up with Dr. Roda Shutters at [facility] in about 2 months. Thanks for the consult.   Marvel Plan, MD PhD Stroke Neurology 10/28/2014 3:58 PM    To contact Stroke Continuity provider, please refer to [email or web address]. After hours, contact General Neurology

## 2014-10-28 NOTE — Progress Notes (Signed)
Nurse notified MD, Dr. Blake DivineAkula that  pt's BP 90/59, HR 102. asymptomatic. EKG completed. Will continue to monitor pt closely   Andrew AuVafiadis, Elba Schaber I 10/28/2014 6:45 PM

## 2014-10-28 NOTE — Progress Notes (Signed)
OT Cancellation Note  Patient Details Name: Tracy PaganiniYvonne D [name] MRN: [id number] DOB: 02/14/1941   Cancelled Treatment:    Reason Eval/Treat Not Completed: Patient at procedure or test/ unavailable. Pt needed for MRI at start of OT evaluation. Acute OT to reattempt evaluation.   Pilar GrammesMathews, Strummer Canipe H 10/28/2014, 2:35 PM

## 2014-10-28 NOTE — Progress Notes (Signed)
Nurse notified MD, Dr. Blake DivineAkula that pt's BP 93/49, HR 89, 100 % O2 sat on RA. Will monitor for orders/return call.   Will continue to monitor pt closely   Andrew AuVafiadis, Jaqwon Manfred I 10/28/2014 9:58 AM

## 2014-10-28 NOTE — Progress Notes (Signed)
[facility] PROGRESS NOTE  TRAMAINE SNELL ZOX:[id number] DOB: [birth year] DOA: 10/26/2014 PCP: Sonda Primes, MD  Brief summary  The patient is a 74 year old female with history of hypertension, CVA 3 years ago with chronic right-sided deficits, slurred speech. She was brought in by her son due to loss of balance and increased confusion. She also complained of a mild frontal headache. In the emergency department, she denied cough, fever, chills, skin rash, ulcer, abdominal pain, nausea, vomiting. She had diarrhea for the past couple of weeks prior to admission but states that it was resolved before coming to the hospital. She was delirious in the emergency department and kept wanting to leave AGAINST MEDICAL ADVICE to take care of her children who are grown up but who she claims were 9, 83, and 74 years old.  No obvious source of infection was identified. There was some question of a possible stroke initially and upon further review on 2/16, the neurologist feels that she probably did have a small stroke. Therefore, she is undergoing the complete stroke workup. Instead of MRA brain and carotid duplex, we have ordered a CT angiogram of the head and neck as we do not feel it patient will be able to lie still for a full MRA and she is anxious to leave. Her CT angiogram of her head and neck does not show any intra or extra cranial flow reduing lesion. But she was febrile of 102 earlier this am. She is undergoing septic work up and a venous   Assessment/Plan  Delirium superimposed on dementia secondary to possible occult infection, unclear source.  Afebrile, WBC trending down somewhat, but still elevated.  No clear signs of infection on exam, although she has poor dentition. I suspect that she may have been somewhat dehydrated.  Per son she is at her baseline mentation currently. Blood cultures have been negative so far, her repeat CXR is negative for infection. She denies any cough or sob. She denies  any dysuria symptoms.  She denies having a new rash. Venous duplex ordered to evaluate for DVT. She continues to have persistent leukocytosis. procalcitonin levels will be ordered.  TSH, b12 levels will be ordered.   Possible small acute stroke, vs small vessel disease Neurology consulted and aspirin changed to plavix 75 mg daily.  Her hgba1c is 5.9 and LDL is 68. CT angio of the head and neck did not reveal any dissection or intra or extra cranial lesions.    Diabetes mellitus  -  Discontinue metformin and start sliding scale insulin hgba1c is 5.9%  Hypertension, blood pressures soft, and she required a bolus of NS earlier this am for an episode of hypotension.  Her BP has improved after the bolus and we continue to hold the BP meds.    Tachycardia; Get 12 lead EKG   Hypokalemia, replaced.   Chronic constipation, continue MiraLAX  Normocytic anemia, likely anemia of chronic disease and hemoglobin approximately stable from baseline. Plan to obtain anemia panel to check b12 and TSH.   Diet:  Diabetic access:  PIV IVF:  Yesph:  Heparin   Code Status: Full Family Communication: Spoke with patient and her son Disposition Plan: IF afebrile, will d/c in am.   Consultants:  Neurology   Procedures:  X-ray hips    CT head  Chest x-ray  MRI brain  Antibiotics:  None   HPI/Subjective: She is upset that she is not going home today. Reports has a lot of work to do at home. Current  denies any new complaints.  Denies headache, tooth pain, sinus congestion, chest pain, short of breath, cough, nausea, abdominal pain, dysuria, diarrhea, skin infection    Objective: Filed Vitals:   10/28/14 0500 10/28/14 0743 10/28/14 0955 10/28/14 1315  BP: 79/43 110/60 93/49 112/71  Pulse: 100  89 101  Temp: 98.1 F (36.7 C)  97.8 F (36.6 C) 98.2 F (36.8 C)  TempSrc: Oral  Oral Oral  Resp: 18     Height:      Weight:      SpO2: 99%  100% 100%    Intake/Output Summary (Last 24  hours) at 10/28/14 1754 Last data filed at 10/28/14 1655  Gross per 24 hour  Intake    240 ml  Output    400 ml  Net   -160 ml   Filed Weights   10/26/14 1519  Weight: 73.029 kg (161 lb)    Exam:   General:   ANXIOUS  To go home,  No acute distress  HEENT:  NCAT, MMM, poor dentition,  Cardiovascular:  RRR, nl S1, S2 no mrg, 2+ pulses, warm extremities  Respiratory:  CTAB, no increased WOB, no wheezing or rhonchi.  Gastrointestinal:soft non tender non distended bowel sounds heard.   MSK:   Normal tone and bulk, no LEE, no tenderness.  Neuro:  Grossly intact, strength and sensation intact, no facial droop  Psych: Alert and oriented to person and place but not time. Anxious to go home.   Data Reviewed: Basic Metabolic Panel:  Recent Labs Lab 10/26/14 1529 10/27/14 0546 10/28/14 0628  NA 138 142 137  K 3.0* 3.6 4.0  CL 102 109 108  CO2 31 23 20   GLUCOSE 108* 96 83  BUN 14 15 12   CREATININE 0.98 0.97 1.09  CALCIUM 8.5 8.2* 8.0*   Liver Function Tests:  Recent Labs Lab 10/26/14 1529  AST 16  ALT 9  ALKPHOS 58  BILITOT 1.3*  PROT 6.3  ALBUMIN 3.7   No results for input(s): LIPASE, AMYLASE in the last 168 hours. No results for input(s): AMMONIA in the last 168 hours. CBC:  Recent Labs Lab 10/26/14 1529 10/27/14 0546 10/28/14 0628  WBC 14.0* 13.5* 16.4*  HGB 10.3* 10.6* 9.4*  HCT 30.2* 32.0* 27.8*  MCV 89.3 88.2 88.3  PLT 302 255 272   Cardiac Enzymes: No results for input(s): CKTOTAL, CKMB, CKMBINDEX, TROPONINI in the last 168 hours. BNP (last 3 results) No results for input(s): BNP in the last 8760 hours.  ProBNP (last 3 results) No results for input(s): PROBNP in the last 8760 hours.  CBG:  Recent Labs Lab 10/27/14 1817 10/27/14 2304 10/28/14 0652 10/28/14 1158 10/28/14 1638  GLUCAP 92 85 72 79 74    Recent Results (from the past 240 hour(s))  Culture, blood (routine x 2)     Status: None (Preliminary result)   Collection  Time: 10/26/14 11:06 PM  Result Value Ref Range Status   Specimen Description BLOOD RIGHT ARM  Final   Special Requests BOTTLES DRAWN AEROBIC ONLY 3CC  Final   Culture   Final           BLOOD CULTURE RECEIVED NO GROWTH TO DATE CULTURE WILL BE HELD FOR 5 DAYS BEFORE ISSUING A FINAL NEGATIVE REPORT [facility]    Report Status PENDING  Incomplete  Culture, blood (routine x 2)     Status: None (Preliminary result)   Collection Time: 10/26/14 11:17 PM  Result Value Ref Range  Status   Specimen Description BLOOD RIGHT ARM  Final   Special Requests BOTTLES DRAWN AEROBIC ONLY 5CC  Final   Culture   Final           BLOOD CULTURE RECEIVED NO GROWTH TO DATE CULTURE WILL BE HELD FOR 5 DAYS BEFORE ISSUING A FINAL NEGATIVE REPORT Performed at [facility]    Report Status PENDING  Incomplete     Studies: Ct Angio Head W/cm &/or Wo Cm  10/27/2014   CLINICAL DATA:  Possible stroke. Fall with altered mental status. Equivocally abnormal MRI showing possible midbrain stroke. Stroke risk factors include hypertension and diabetes, as well as previous infarcts.  EXAM: CT ANGIOGRAPHY HEAD AND NECK  TECHNIQUE: Multidetector CT imaging of the head and neck was performed using the standard protocol during bolus administration of intravenous contrast. Multiplanar CT image reconstructions and MIPs were obtained to evaluate the vascular anatomy. Carotid stenosis measurements (when applicable) are obtained utilizing NASCET criteria, using the distal internal carotid diameter as the denominator.  CONTRAST:  50mL OMNIPAQUE IOHEXOL 350 MG/ML SOLN  COMPARISON:  MRI brain 10/26/2014.  FINDINGS: CT HEAD  Calvarium and skull base: No fracture or destructive lesion. Mastoids and middle ears are grossly clear.  Paranasal sinuses: Imaged portions are clear.  Orbits: Negative.  Brain: No evidence of acute abnormality, including acute infarct, hemorrhage, hydrocephalus, or mass lesion. Atrophy with  chronic microvascular ischemia. Remote areas of chronic infarction.  CTA NECK  Aortic arch: Bovine arch. Imaged portion shows no evidence of aneurysm or dissection. No significant stenosis of the major arch vessel origins.  Right carotid system: No evidence of dissection, stenosis (50% or greater) or occlusion.  Left carotid system: No evidence of dissection, stenosis (50% or greater) or occlusion.  Vertebral arteries: Codominant. No evidence of dissection, stenosis (50% or greater) or occlusion.  Non vascular structures: Spondylosis. No lung apex lesion. Subcentimeter RIGHT thyroid lesion, likely cyst.  CTA HEAD  Anterior circulation: No significant stenosis, proximal occlusion, aneurysm, or vascular malformation.  Posterior circulation: No significant stenosis, proximal occlusion, aneurysm, or vascular malformation.  Venous sinuses: As permitted by contrast timing, patent.  Anatomic variants: None of significance.  Delayed phase:   No abnormal intracranial enhancement.  IMPRESSION: No extracranial or intracranial flow reducing lesion. No evidence for dissection acute intracranial abnormality.   Electronically Signed   By: Davonna Belling M.D.   On: 10/27/2014 19:12   Dg Chest 2 View  10/28/2014   CLINICAL DATA:  Fever onset today with mild weakness.  EXAM: CHEST  2 VIEW  COMPARISON:  10/26/2014 and 07/29/2008  FINDINGS: Lungs are adequately inflated without focal consolidation or effusion. There is mild stable cardiomegaly. Mild prominence of the main pulmonary artery segment. There are mild degenerative changes of the spine.  IMPRESSION: No active cardiopulmonary disease.   Electronically Signed   By: Elberta Fortis M.D.   On: 10/28/2014 15:42   Dg Chest 2 View  10/26/2014   CLINICAL DATA:  Systemic inflammatory response syndrome. Shortness of breath.  EXAM: CHEST  2 VIEW  COMPARISON:  PA and lateral chest 07/29/2008.  CT chest 07/20/2008.  FINDINGS: The lungs are clear. Heart size is upper normal. No  pneumothorax or pleural effusion.  IMPRESSION: No acute disease.   Electronically Signed   By: Drusilla Kanner M.D.   On: 10/26/2014 22:45   Ct Angio Neck W/cm &/or Wo/cm  10/27/2014   CLINICAL DATA:  Possible stroke. Fall with altered mental status. Equivocally abnormal MRI  showing possible midbrain stroke. Stroke risk factors include hypertension and diabetes, as well as previous infarcts.  EXAM: CT ANGIOGRAPHY HEAD AND NECK  TECHNIQUE: Multidetector CT imaging of the head and neck was performed using the standard protocol during bolus administration of intravenous contrast. Multiplanar CT image reconstructions and MIPs were obtained to evaluate the vascular anatomy. Carotid stenosis measurements (when applicable) are obtained utilizing NASCET criteria, using the distal internal carotid diameter as the denominator.  CONTRAST:  50mL OMNIPAQUE IOHEXOL 350 MG/ML SOLN  COMPARISON:  MRI brain 10/26/2014.  FINDINGS: CT HEAD  Calvarium and skull base: No fracture or destructive lesion. Mastoids and middle ears are grossly clear.  Paranasal sinuses: Imaged portions are clear.  Orbits: Negative.  Brain: No evidence of acute abnormality, including acute infarct, hemorrhage, hydrocephalus, or mass lesion. Atrophy with chronic microvascular ischemia. Remote areas of chronic infarction.  CTA NECK  Aortic arch: Bovine arch. Imaged portion shows no evidence of aneurysm or dissection. No significant stenosis of the major arch vessel origins.  Right carotid system: No evidence of dissection, stenosis (50% or greater) or occlusion.  Left carotid system: No evidence of dissection, stenosis (50% or greater) or occlusion.  Vertebral arteries: Codominant. No evidence of dissection, stenosis (50% or greater) or occlusion.  Non vascular structures: Spondylosis. No lung apex lesion. Subcentimeter RIGHT thyroid lesion, likely cyst.  CTA HEAD  Anterior circulation: No significant stenosis, proximal occlusion, aneurysm, or vascular  malformation.  Posterior circulation: No significant stenosis, proximal occlusion, aneurysm, or vascular malformation.  Venous sinuses: As permitted by contrast timing, patent.  Anatomic variants: None of significance.  Delayed phase:   No abnormal intracranial enhancement.  IMPRESSION: No extracranial or intracranial flow reducing lesion. No evidence for dissection acute intracranial abnormality.   Electronically Signed   By: Davonna Belling M.D.   On: 10/27/2014 19:12   Mr Brain Wo Contrast  10/26/2014   CLINICAL DATA:  Fall.  Altered mental status.  Rule out stroke.  EXAM: MRI HEAD WITHOUT CONTRAST  TECHNIQUE: Multiplanar, multiecho pulse sequences of the brain and surrounding structures were obtained without intravenous contrast.  COMPARISON:  CT head 10/26/2014  FINDINGS: Possible small area of acute infarct in the central midbrain. This cannot be confirmed on coronal diffusion-weighted imaging but does appear to have restricted diffusion measuring approximately 3 mm. No other acute infarct  Age-appropriate atrophy. Chronic infarcts in the basal ganglia bilaterally and left thalamus. Minimal chronic microvascular ischemia in the white matter.  Negative for hemorrhage or fluid collection  Negative for mass or edema.  IMPRESSION: Possible 3 mm acute infarct in the central midbrain, not confirmed on coronal diffusion.  Atrophy and chronic microvascular ischemia.   Electronically Signed   By: Marlan Palau M.D.   On: 10/26/2014 20:41    Scheduled Meds: . clopidogrel  75 mg Oral Daily  . heparin  5,000 Units Subcutaneous 3 times per day  . insulin aspart  0-9 Units Subcutaneous TID WC  . PARoxetine  10 mg Oral Daily  . potassium chloride  40 mEq Oral BID  . pravastatin  10 mg Oral q1800  . sodium chloride  3 mL Intravenous Q12H   Continuous Infusions: . sodium chloride 100 mL/hr at 10/28/14 1151    Principal Problem:   Delirium Active Problems:   HTN (hypertension)   History of CVA  (cerebrovascular accident)   Dementia   Tachycardia   SIRS (systemic inflammatory response syndrome)   Stroke   Type 2 diabetes mellitus with other circulatory complications  Time spent: 30 min    Ala Capri  [facility] Pager [phone] If 7PM-7AM, please contact night-coverage at [email or web address], password [facility] 10/28/2014, 5:54 PM  LOS: 2 days

## 2014-10-29 DIAGNOSIS — R509 Fever, unspecified: Secondary | ICD-10-CM

## 2014-10-29 LAB — FOLATE: Folate: 11.2 ng/mL

## 2014-10-29 LAB — GLUCOSE, CAPILLARY
GLUCOSE-CAPILLARY: 82 mg/dL (ref 70–99)
GLUCOSE-CAPILLARY: 96 mg/dL (ref 70–99)
Glucose-Capillary: 89 mg/dL (ref 70–99)
Glucose-Capillary: 72 mg/dL (ref 70–99)
Glucose-Capillary: 81 mg/dL (ref 70–99)

## 2014-10-29 LAB — BASIC METABOLIC PANEL
ANION GAP: 11 (ref 5–15)
BUN: 11 mg/dL (ref 6–23)
CHLORIDE: 109 mmol/L (ref 96–112)
CO2: 18 mmol/L — ABNORMAL LOW (ref 19–32)
Calcium: 8.3 mg/dL — ABNORMAL LOW (ref 8.4–10.5)
Creatinine, Ser: 0.87 mg/dL (ref 0.50–1.10)
GFR calc non Af Amer: 65 mL/min — ABNORMAL LOW (ref >90)
GFR, EST AFRICAN AMERICAN: 75 mL/min — AB (ref >90)
Glucose, Bld: 72 mg/dL (ref 70–99)
Potassium: 4.9 mmol/L (ref 3.5–5.1)
Sodium: 138 mmol/L (ref 135–145)

## 2014-10-29 LAB — IRON AND TIBC
Iron: <10 ug/dL — ABNORMAL LOW (ref 42–145)
UIBC: 192 ug/dL (ref 125–400)

## 2014-10-29 LAB — CBC
HCT: 31.1 % — ABNORMAL LOW (ref 36.0–46.0)
Hemoglobin: 10.4 g/dL — ABNORMAL LOW (ref 12.0–15.0)
MCH: 29.5 pg (ref 26.0–34.0)
MCHC: 33.4 g/dL (ref 30.0–36.0)
MCV: 88.4 fL (ref 78.0–100.0)
Platelets: 359 10*3/uL (ref 150–400)
RBC: 3.52 MIL/uL — AB (ref 3.87–5.11)
RDW: 14.8 % (ref 11.5–15.5)
WBC: 16.1 10*3/uL — ABNORMAL HIGH (ref 4.0–10.5)

## 2014-10-29 LAB — VITAMIN B12: Vitamin B-12: 355 pg/mL (ref 211–911)

## 2014-10-29 LAB — FERRITIN: Ferritin: 296 ng/mL — ABNORMAL HIGH (ref 10–291)

## 2014-10-29 MED ORDER — LORAZEPAM 2 MG/ML IJ SOLN
0.5000 mg | Freq: Once | INTRAMUSCULAR | Status: AC
  Administered 2014-10-29: 0.5 mg via INTRAVENOUS
  Filled 2014-10-29: qty 1

## 2014-10-29 MED ORDER — SODIUM CHLORIDE 0.9 % IV BOLUS (SEPSIS)
1000.0000 mL | Freq: Once | INTRAVENOUS | Status: AC
  Administered 2014-10-29: 1000 mL via INTRAVENOUS

## 2014-10-29 MED ORDER — LEVOFLOXACIN 500 MG PO TABS
500.0000 mg | ORAL_TABLET | Freq: Every day | ORAL | Status: DC
  Administered 2014-10-29 – 2014-10-30 (×2): 500 mg via ORAL
  Filled 2014-10-29 (×2): qty 1

## 2014-10-29 MED ORDER — VITAMIN B-12 1000 MCG PO TABS
1000.0000 ug | ORAL_TABLET | Freq: Every day | ORAL | Status: DC
  Administered 2014-10-29 – 2014-10-30 (×2): 1000 ug via ORAL
  Filled 2014-10-29 (×2): qty 1

## 2014-10-29 NOTE — Clinical Social Work Note (Signed)
CSW Consult Acknowledged:   CSW received a consult for SNF placement. CSW met with the pt's son and daughter at the bedside. Both of the pt's children agreed it was best that the pt come home with home health services. CSW informed case manager. CSW will sign off.     , MSW, LCSWA [phone]  

## 2014-10-29 NOTE — Progress Notes (Signed)
[facility] PROGRESS NOTE  SHERE EISENHART ZOX:[id number] DOB: 1941/03/08 DOA: 10/26/2014 PCP: Sonda Primes, MD  Brief summary  The patient is a 73 year old female with history of hypertension, CVA 3 years ago with chronic right-sided deficits, slurred speech. She was brought in by her son due to loss of balance and increased confusion. She also complained of a mild frontal headache. In the emergency department, she denied cough, fever, chills, skin rash, ulcer, abdominal pain, nausea, vomiting. She had diarrhea for the past couple of weeks prior to admission but states that it was resolved before coming to the hospital. She was delirious in the emergency department and kept wanting to leave AGAINST MEDICAL ADVICE to take care of her children who are grown up but who she claims were 9, 70, and 74 years old.  No obvious source of infection was identified. There was some question of a possible stroke initially and upon further review on 2/16, the neurologist feels that she probably did have a small stroke. Therefore, she is undergoing the complete stroke workup. Instead of MRA brain and carotid duplex, we have ordered a CT angiogram of the head and neck as we do not feel it patient will be able to lie still for a full MRA and she is anxious to leave. Her CT angiogram of her head and neck does not show any intra or extra cranial flow reduing lesion. But she was febrile of 102 earlier this am. She is undergoing septic work up and a venous dopplers ordered and is negative for acute DVT. She was found to have aboil on the back. The boil burst and she has mild cellulitis changes surrounding it.   Assessment/Plan  Delirium superimposed on dementia secondary to possible occult infection, unclear source.  Afebrile, WBC trending down somewhat, but still elevated.  No clear signs of infection on exam, although she has poor dentition. I suspect that she may have been somewhat dehydrated.  Per son she is at her  baseline mentation currently. Blood cultures have been negative so far, her repeat CXR is negative for infection. She denies any cough or sob. She denies any dysuria symptoms.  She denies having a new rash. Venous duplex ordered to evaluate for DVT and it was negative . She continues to have persistent leukocytosis. procalcitonin levels ordered, and is 0.86.  TSH, b12 levels will be ordered and are pending.   Possible small acute stroke, vs small vessel disease Neurology consulted and aspirin changed to plavix 75 mg daily.  Her hgba1c is 5.9 and LDL is 68. CT angio of the head and neck did not reveal any dissection or intra or extra cranial lesions.    Diabetes mellitus  -  Discontinue metformin and start sliding scale insulin hgba1c is 5.9%. CBG (last 3)   Recent Labs  10/29/14 0700 10/29/14 1207 10/29/14 1642  GLUCAP 89 96 72      Hypertension, blood pressures soft, and she required a bolus of NS earlier this am for an episode of hypotension.  Her BP has improved after the bolus and we continue to hold the BP meds.  Her bp after the bolus are normal.   Mild cellulitis around the boil: Started on IV antibiotics. Continue to monitor.    Tachycardia; 12 lead EKG shows sinus tachycardia.   Hypokalemia, replaced.   Chronic constipation, continue MiraLAX  Normocytic anemia, likely anemia of chronic disease and hemoglobin approximately stable from baseline. anemia panel to check b12 and TSH ARE ALL WITHIN  NORMAL LIMITS. Marland Kitchen.   Diet:  Diabetic access:  PIV IVF:  Yesph:  Heparin   Code Status: Full Family Communication: Spoke with patient and her son, her daughter on the phone.  Disposition Plan: home in a day or two.    Consultants:  Neurology   Procedures:  X-ray hips    CT head  Chest x-ray  MRI brain  Antibiotics:  None   HPI/Subjective: She is slightly confused today, very upset about the fact that she has to stay in the hospital.  She denies any sob,  chest pain, nausea or vomiting.   Objective: Filed Vitals:   10/29/14 1635 10/29/14 1636 10/29/14 1637 10/29/14 1804  BP: 104/59 97/58 96/62  102/62  Pulse:    85  Temp:    97.9 F (36.6 C)  TempSrc:    Oral  Resp:    20  Height:      Weight:      SpO2:    92%    Intake/Output Summary (Last 24 hours) at 10/29/14 2032 Last data filed at 10/29/14 1100  Gross per 24 hour  Intake    100 ml  Output      0 ml  Net    100 ml   Filed Weights   10/26/14 1519  Weight: 73.029 kg (161 lb)    Exam:   General:   ANXIOUS  To go home,  No acute distress  HEENT:  NCAT, MMM, poor dentition,  Cardiovascular:  RRR, nl S1, S2 no mrg, 2+ pulses, warm extremities  Respiratory:  CTAB, no increased WOB, no wheezing or rhonchi.  Gastrointestinal:soft non tender non distended bowel sounds heard.   MSK:   Normal tone and bulk, no LEE, no tenderness.  Neuro:  Grossly intact, strength and sensation intact, no facial droop  Psych: Alert and oriented to person and place but not time. Anxious to go home.   Skin: boil over the back and surrounding cellulits changes.   Data Reviewed: Basic Metabolic Panel:  Recent Labs Lab 10/26/14 1529 10/27/14 0546 10/28/14 0628 10/29/14 1648  NA 138 142 137 138  K 3.0* 3.6 4.0 4.9  CL 102 109 108 109  CO2 31 23 20  18*  GLUCOSE 108* 96 83 72  BUN 14 15 12 11   CREATININE 0.98 0.97 1.09 0.87  CALCIUM 8.5 8.2* 8.0* 8.3*   Liver Function Tests:  Recent Labs Lab 10/26/14 1529  AST 16  ALT 9  ALKPHOS 58  BILITOT 1.3*  PROT 6.3  ALBUMIN 3.7   No results for input(s): LIPASE, AMYLASE in the last 168 hours. No results for input(s): AMMONIA in the last 168 hours. CBC:  Recent Labs Lab 10/26/14 1529 10/27/14 0546 10/28/14 0628 10/29/14 0803  WBC 14.0* 13.5* 16.4* 16.1*  HGB 10.3* 10.6* 9.4* 10.4*  HCT 30.2* 32.0* 27.8* 31.1*  MCV 89.3 88.2 88.3 88.4  PLT 302 255 272 359   Cardiac Enzymes: No results for input(s): CKTOTAL, CKMB,  CKMBINDEX, TROPONINI in the last 168 hours. BNP (last 3 results) No results for input(s): BNP in the last 8760 hours.  ProBNP (last 3 results) No results for input(s): PROBNP in the last 8760 hours.  CBG:  Recent Labs Lab 10/28/14 1638 10/28/14 2226 10/29/14 0700 10/29/14 1207 10/29/14 1642  GLUCAP 74 82 89 96 72    Recent Results (from the past 240 hour(s))  Culture, blood (routine x 2)     Status: None (Preliminary result)   Collection Time: 10/26/14 11:06 PM  Result Value Ref Range Status   Specimen Description BLOOD RIGHT ARM  Final   Special Requests BOTTLES DRAWN AEROBIC ONLY 3CC  Final   Culture   Final           BLOOD CULTURE RECEIVED NO GROWTH TO DATE CULTURE WILL BE HELD FOR 5 DAYS BEFORE ISSUING A FINAL NEGATIVE REPORT Performed at [facility]    Report Status PENDING  Incomplete  Culture, blood (routine x 2)     Status: None (Preliminary result)   Collection Time: 10/26/14 11:17 PM  Result Value Ref Range Status   Specimen Description BLOOD RIGHT ARM  Final   Special Requests BOTTLES DRAWN AEROBIC ONLY 5CC  Final   Culture   Final           BLOOD CULTURE RECEIVED NO GROWTH TO DATE CULTURE WILL BE HELD FOR 5 DAYS BEFORE ISSUING A FINAL NEGATIVE REPORT Performed at [facility]    Report Status PENDING  Incomplete  Culture, routine-abscess     Status: None (Preliminary result)   Collection Time: 10/29/14  6:20 AM  Result Value Ref Range Status   Specimen Description ABSCESS BUTTOCKS RIGHT  Final   Special Requests RIGHT LOWER BUTTOCK NEAR INNER THIGH  Final   Gram Stain   Final    FEW WBC PRESENT, PREDOMINANTLY PMN NO SQUAMOUS EPITHELIAL CELLS SEEN MODERATE GRAM POSITIVE COCCI IN PAIRS FEW GRAM POSITIVE RODS FEW GRAM NEGATIVE RODS    Culture PENDING  Incomplete   Report Status PENDING  Incomplete     Studies: Dg Chest 2 View  10/28/2014   CLINICAL DATA:  Fever onset today with mild weakness.  EXAM: CHEST  2 VIEW  COMPARISON:   10/26/2014 and 07/29/2008  FINDINGS: Lungs are adequately inflated without focal consolidation or effusion. There is mild stable cardiomegaly. Mild prominence of the main pulmonary artery segment. There are mild degenerative changes of the spine.  IMPRESSION: No active cardiopulmonary disease.   Electronically Signed   By: Elberta Fortis M.D.   On: 10/28/2014 15:42    Scheduled Meds: . clopidogrel  75 mg Oral Daily  . heparin  5,000 Units Subcutaneous 3 times per day  . insulin aspart  0-9 Units Subcutaneous TID WC  . levofloxacin  500 mg Oral Daily  . PARoxetine  10 mg Oral Daily  . pravastatin  10 mg Oral q1800  . sodium chloride  3 mL Intravenous Q12H  . vitamin B-12  1,000 mcg Oral Daily   Continuous Infusions: . sodium chloride 100 mL/hr at 10/29/14 1046    Principal Problem:   Delirium Active Problems:   HTN (hypertension)   History of CVA (cerebrovascular accident)   Dementia   Tachycardia   SIRS (systemic inflammatory response syndrome)   Stroke   Type 2 diabetes mellitus with other circulatory complications   Fever    Time spent: 30 min    Faye Strohman  [facility] Pager [phone] If 7PM-7AM, please contact night-coverage at [email or web address], password [facility] 10/29/2014, 8:32 PM  LOS: 3 days

## 2014-10-29 NOTE — Progress Notes (Signed)
Md notified about boil. Noted increased yellow drainage, odor, and pt complained of pain to touch. Applied warm compresses. Pt currently on antibiotics per Md. Will continue to monitor.

## 2014-10-29 NOTE — Progress Notes (Signed)
VASCULAR LAB PRELIMINARY  PRELIMINARY  PRELIMINARY  PRELIMINARY  Bilateral lower extremity venous Dopplers completed.    Preliminary report:  There is no DVT or SVT noted in the bilateral lower extremities.   Takiera Mayo, RVT 10/29/2014, 10:20 AM

## 2014-10-29 NOTE — Progress Notes (Deleted)
Md notified about boil to peri area. Noted yellow drainage and odor. Painful to touch. Applied warm compresses. Pt currently on ABT's. Will continue to monitor.

## 2014-10-29 NOTE — Progress Notes (Signed)
Talked to patient's son and daughter about DCP; patient is confused and is refusing SNF placement and is very upset because she thinks that the hospital is putting her there/ sitter at bedside; patient stated " don't come back in here again!" Family member apologized for her behavior stating that she is confused; DCP is home at discharge. Patient has 7 children and they take turns caring for her. Patient's son stated her son's do the men stuff and the daughters do the women's stuff; HHC choices offered, [facility]; No DME needed per family; Miranda with [facility] called for arrangement; Alexis GoodellB Kodah Maret RN,BSN,MHA [phone]

## 2014-10-29 NOTE — Evaluation (Signed)
Occupational Therapy Evaluation Patient Details Name: Tracy PaganiniYvonne D [name] MRN: [id number] DOB: 04/21/1941 Today's Date: 10/29/2014    History of Present Illness Patient is a 74 y/o female with PMH of HTN, DM and CVA 3 years ago with chronic R sided deficits and slurred speech.Pt is brought in today by her son due to falls and confusion. MRI brain- Possible 3 mm acute infarct in the central midbrain, not confirmed. Pt admitted w/ delirium with SIRS.   Clinical Impression   Pt admitted with the above diagnoses and presents with below problem list. Pt will benefit from continued acute OT to address the below listed deficits and maximize independence with basic ADLs prior to d/c home with family. Pt presents with cognitive deficits worse than baseline per family's report. Per family report pt with decreased cognition during hospital stay and performs better once home. Discussed at length with familty SNF vs. Home with home health. Family states that they can provide level of care and want her to d/c home.      Follow Up Recommendations  Supervision/assistance 24 hour; Home Health OT   Equipment Recommendations  None   Recommendations for Other Services       Precautions / Restrictions Precautions Precautions: Fall Restrictions Weight Bearing Restrictions: No      Mobility Bed Mobility Overal bed mobility: Needs Assistance Bed Mobility: Supine to Sit;Sit to Supine     Supine to sit: Mod assist;HOB elevated Sit to supine: Mod assist;HOB elevated   General bed mobility comments: pt completed log rolling technique with mod A  Transfers Overall transfer level: Needs assistance Equipment used: Rolling walker (2 wheeled) Transfers: Sit to/from Stand Sit to Stand: Min assist         General transfer comment: to stand from EOB    Balance Overall balance assessment: Needs assistance       Postural control: Posterior lean Standing balance support: Bilateral upper extremity  supported Standing balance-Leahy Scale: Poor Standing balance comment: used rw                            ADL Overall ADL's : Needs assistance/impaired Eating/Feeding: Set up;Sitting   Grooming: Set up;Sitting   Upper Body Bathing: Sitting;Minimal assitance   Lower Body Bathing: Sit to/from stand;Moderate assistance   Upper Body Dressing : Minimal assistance;Sitting   Lower Body Dressing: Sit to/from stand;Moderate assistance   Toilet Transfer: Moderate assistance;Ambulation;BSC;RW   Toileting- Clothing Manipulation and Hygiene: Moderate assistance;Sit to/from stand   Tub/ Engineer, structuralhower Transfer: Moderate assistance;Ambulation;3 in 1     General ADL Comments: ADL assistance      Vision Additional Comments: unable to assess second to cognition   Perception     Praxis      Pertinent Vitals/Pain Pain Assessment: No/denies pain     Hand Dominance Right   Extremity/Trunk Assessment Upper Extremity Assessment Upper Extremity Assessment: Difficult to assess due to impaired cognition;Generalized weakness;RUE deficits/detail;LUE deficits/detail RUE Deficits / Details: RUE presents weaker than LUE; WFL AROM; extra time needed LUE Deficits / Details: LUE appear a little stronger than RUE; WFL AROM extra time needed   Lower Extremity Assessment Lower Extremity Assessment: Defer to PT evaluation       Communication Communication Communication: HOH;Expressive difficulties   Cognition Arousal/Alertness: Awake/alert Behavior During Therapy: Agitated Overall Cognitive Status: Impaired/Different from baseline Area of Impairment: Orientation;Safety/judgement;Attention;Memory Orientation Level: Place;Time;Situation   Memory: Decreased short-term memory;Decreased recall of precautions   Safety/Judgement: Decreased awareness of deficits;Decreased  awareness of safety   Problem Solving: Slow processing;Decreased initiation;Difficulty sequencing;Requires verbal  cues;Requires tactile cues General Comments: Pt's family reports pt has a h/o hospital delirium and once she is home she performs better and remembers nothing of her hospital stay.   General Comments       Exercises       Shoulder Instructions      Home Living Family/patient expects to be discharged to:: Private residence Living Arrangements: Spouse/significant other;Children Available Help at Discharge: Family;Available 24 hours/day Type of Home: House Home Access: Stairs to enter [employer] of Steps: 4 Entrance Stairs-Rails: Right Home Layout: One level     Bathroom Shower/Tub: Producer, television/film/video: Standard     Home Equipment: Cane - single point;Walker - 2 wheels;Bedside commode   Additional Comments: per son and daughter pt lives with husband and son who are with her 24/7      Prior Functioning/Environment Level of Independence: Independent with assistive device(s)        Comments: per son's report pt used a cane    OT Diagnosis: Cognitive deficits;Generalized weakness;Altered mental status   OT Problem List: Decreased strength;Decreased activity tolerance;Impaired balance (sitting and/or standing);Decreased cognition;Decreased safety awareness;Decreased knowledge of use of DME or AE;Decreased knowledge of precautions   OT Treatment/Interventions: Self-care/ADL training;Energy conservation;DME and/or AE instruction;Therapeutic activities;Cognitive remediation/compensation;Patient/family education;Balance training    OT Goals(Current goals can be found in the care plan section) Acute Rehab OT Goals Patient Stated Goal: none stated OT Goal Formulation: Patient unable to participate in goal setting Time For Goal Achievement: 11/05/14 Potential to Achieve Goals: Fair ADL Goals Pt Will Perform Grooming: with supervision;standing;sitting Pt Will Transfer to Toilet: with supervision;ambulating (3n1 over toilet) Pt Will Perform Toileting -  Clothing Manipulation and hygiene: with supervision;sit to/from stand Additional ADL Goal #1: Pt will complete bed mobility at supervision level to prepare for OOB ADls.  OT Frequency: Min 2X/week   Barriers to D/C:            Co-evaluation              End of Session Equipment Utilized During Treatment: Rolling walker  Activity Tolerance: Treatment limited secondary to agitation Patient left: in bed;with call bell/phone within reach;with nursing/sitter in room;with family/visitor present   Time: 1610-9604 OT Time Calculation (min): 36 min Charges:  OT General Charges $OT Visit: 1 Procedure OT Evaluation $Initial OT Evaluation Tier I: 1 Procedure OT Treatments $Self Care/Home Management : 8-22 mins G-Codes:    Pilar Grammes Nov 01, 2014, 10:53 AM

## 2014-10-30 LAB — PROCALCITONIN: Procalcitonin: 0.42 ng/mL

## 2014-10-30 LAB — GLUCOSE, CAPILLARY
Glucose-Capillary: 82 mg/dL (ref 70–99)
Glucose-Capillary: 73 mg/dL (ref 70–99)

## 2014-10-30 LAB — URINE CULTURE
Colony Count: NO GROWTH
Culture: NO GROWTH

## 2014-10-30 LAB — CBC
HCT: 28.6 % — ABNORMAL LOW (ref 36.0–46.0)
HEMOGLOBIN: 9.7 g/dL — AB (ref 12.0–15.0)
MCH: 30.1 pg (ref 26.0–34.0)
MCHC: 33.9 g/dL (ref 30.0–36.0)
MCV: 88.8 fL (ref 78.0–100.0)
Platelets: 375 10*3/uL (ref 150–400)
RBC: 3.22 MIL/uL — AB (ref 3.87–5.11)
RDW: 14.8 % (ref 11.5–15.5)
WBC: 11.6 10*3/uL — ABNORMAL HIGH (ref 4.0–10.5)

## 2014-10-30 MED ORDER — CLINDAMYCIN HCL 300 MG PO CAPS: 300.0000 mg | ORAL_CAPSULE | Freq: Three times a day (TID) | ORAL | Status: DC

## 2014-10-30 MED ORDER — LEVOFLOXACIN 500 MG PO TABS: 500.0000 mg | ORAL_TABLET | Freq: Every day | ORAL | Status: DC

## 2014-10-30 MED ORDER — CYANOCOBALAMIN 1000 MCG PO TABS: 1000.0000 ug | ORAL_TABLET | Freq: Every day | ORAL | Status: DC

## 2014-10-30 MED ORDER — PRAVASTATIN SODIUM 10 MG PO TABS: 10.0000 mg | ORAL_TABLET | Freq: Every day | ORAL | Status: DC

## 2014-10-30 NOTE — Progress Notes (Signed)
Occupational Therapy Treatment Patient Details Name: Tracy PaganiniYvonne D Scheeler MRN: [id number] DOB: 04/25/1941 Today's Date: 10/30/2014    History of present illness Patient is a 74 y/o female with PMH of HTN, DM and CVA 3 years ago with chronic R sided deficits and slurred speech.Pt is brought in today by her son due to falls and confusion. MRI brain- Possible 3 mm acute infarct in the central midbrain, not confirmed. Pt admitted w/ delirium with SIRS.   OT comments  Pt making progress with functional goals and should continue with acute OT services to increase level of function and safety to return home with family assist/sup. Pt confused at times, agitated about being in the hospital. Required increased time to initiate and completed tasks. Pt balled fist up at OT and stated "get you hat and coat and go!" then started laughing when her son asked her to stop speaking to the OT in that way. Pt required redirection to stay on task and follow instructions  Follow Up Recommendations  Home health OT;Supervision/Assistance - 24 hour    Equipment Recommendations  None recommended by OT    Recommendations for Other Services      Precautions / Restrictions Precautions Precautions: Fall Restrictions Weight Bearing Restrictions: No       Mobility Bed Mobility               General bed mobility comments: pt up in recliner  Transfers Overall transfer level: Needs assistance Equipment used: Rolling walker (2 wheeled) Transfers: Sit to/from Stand Sit to Stand: Min assist         General transfer comment: to stand from recliner, BSC and EOB    Balance   Sitting-balance support: No upper extremity supported;Feet supported Sitting balance-Leahy Scale: Good     Standing balance support: Bilateral upper extremity supported;During functional activity;Single extremity supported Standing balance-Leahy Scale: Poor                     ADL       Grooming: Min guard;Standing                    Toilet Transfer: Ambulation;BSC;RW;Minimal assistance   Toileting- Clothing Manipulation and Hygiene: Moderate assistance;Sit to/from stand;Minimal assistance         General ADL Comments: pt required redirection and cues to attend to and complete tasks. required increased time to intiiate tasks due to cognition      Vision  no change form baseline                              Cognition   Behavior During Therapy: Agitated Overall Cognitive Status: Impaired/Different from baseline       Memory: Decreased short-term memory;Decreased recall of precautions    Safety/Judgement: Decreased awareness of deficits;Decreased awareness of safety          Extremity/Trunk Assessment   WFL                        General Comments  Pt confused and agitated, but mostly cooperative    Pertinent Vitals/ Pain       Pain Assessment: No/denies pain  Home Living  home with family assist and sup  Prior Functioning/Environment  supervision           Frequency Min 2X/week     Progress Toward Goals  OT Goals(current goals can now be found in the care plan section)  Progress towards OT goals: Progressing toward goals     Plan Discharge plan remains appropriate    End of Session Equipment Utilized During Treatment: Rolling walker;Other (comment) (BSC)   Activity Tolerance Patient tolerated treatment well   Patient Left with call bell/phone within reach;with family/visitor present;in chair             Time: 4540-9811 OT Time Calculation (min): 24 min  Charges: OT General Charges $OT Visit: 1 Procedure OT Treatments $Self Care/Home Management : 8-22 mins $Therapeutic Activity: 8-22 mins  Galen Manila 10/30/2014, 1:14 PM

## 2014-10-30 NOTE — Progress Notes (Signed)
Pt discharged at this time with son with all personal belongings. Alert, verbal with no noted distress. She denied pain or discomfort. IV's discontinued, dry dressing applied. Discharge instructions provided along with prescriptions given to son with verbal understanding. Pt and son aware of follow up appts.

## 2014-10-30 NOTE — Progress Notes (Signed)
Physical Therapy Treatment Patient Details Name: Tracy [name] MRN: [id number] DOB: 12/12/1940 Today's Date: 10/30/2014    History of Present Illness Patient is a 74 y/o female with PMH of HTN, DM and CVA 3 years ago with chronic R sided deficits and slurred speech.Pt is brought in today by her son due to falls and confusion. MRI brain- Possible 3 mm acute infarct in the central midbrain, not confirmed. Pt admitted w/ delirium with SIRS.    PT Comments    Patient progressing slowly with mobility. Continues to requires Min-Mod A to stand and constant cues for technique. Pt easily distracted and requires constant redirection and attention to task. Very agitated and irritated this session due to not being home yet. Tolerated stair negotiation with min A for safety. Pt with impaired safety awareness - pt went to sit without chair behind her resulting in therapist needing to guide bottom into chair to prevent fall. Family witnessed event. Family refusing ST SNF. If pt returns home, will need 24/7 hands on assist and HHPT.   Follow Up Recommendations  SNF;Supervision/Assistance - 24 hour (if pt goes home, HHPT).     Equipment Recommendations  None recommended by PT    Recommendations for Other Services       Precautions / Restrictions Precautions Precautions: Fall Restrictions Weight Bearing Restrictions: No    Mobility  Bed Mobility               General bed mobility comments: pt up in recliner  Transfers Overall transfer level: Needs assistance Equipment used: Rolling walker (2 wheeled) Transfers: Sit to/from Stand Sit to Stand: Mod assist         General transfer comment: Mod A to stand from recliner with manual cues for technique. Not able to problem solve how to stand independently or with verbal cues. Min A to stand from toilet. Upon return to recliner, pt with complete LOB going to sit without chair behind her requiring therapist to push bottom onto chair. No  awareness. "See that is what she does." says son  Ambulation/Gait Ambulation/Gait assistance: Min assist Ambulation Distance (Feet): 125 Feet Assistive device: Rolling walker (2 wheeled) Gait Pattern/deviations: Step-through pattern;Decreased dorsiflexion - right;Decreased step length - left;Drifts right/left;Staggering left;Staggering right   Gait velocity interpretation: Below normal speed for age/gender General Gait Details: Veers right on multiple occasions  bumping into walls/doorways. Easily distracted. Dragging RLE during swing phase requiring manual cues to facilitate hip flexion. Furniture walker within room without RW. Min A for balance/safety.   Stairs Stairs: Yes Stairs assistance: Min assist Stair Management: Two rails;Alternating pattern Number of Stairs: 3 (+ 2 steps x2 bouts) General stair comments: Constant cues for technique however despite cues, pt demonstratring alternating stepping pattern. Easily distracted and requires constant cues to attend to task.  Wheelchair Mobility    Modified Rankin (Stroke Patients Only)       Balance Overall balance assessment: Needs assistance Sitting-balance support: Feet supported;No upper extremity supported Sitting balance-Leahy Scale: Fair   Postural control: Posterior lean Standing balance support: During functional activity Standing balance-Leahy Scale: Poor                      Cognition Arousal/Alertness: Awake/alert Behavior During Therapy: Agitated Overall Cognitive Status: Impaired/Different from baseline Area of Impairment: Orientation;Safety/judgement;Memory;Problem solving Orientation Level: Disoriented to;Place;Time;Situation   Memory: Decreased short-term memory   Safety/Judgement: Decreased awareness of safety;Decreased awareness of deficits   Problem Solving: Slow processing;Decreased initiation;Difficulty sequencing;Requires verbal cues;Requires tactile cues  Exercises      General  Comments        Pertinent Vitals/Pain Pain Assessment: Faces Faces Pain Scale: Hurts whole lot Pain Location: bottom when sitting Pain Descriptors / Indicators: Sore Pain Intervention(s): Monitored during session;Repositioned    Home Living                      Prior Function            PT Goals (current goals can now be found in the care plan section) Progress towards PT goals: Progressing toward goals    Frequency  Min 3X/week    PT Plan Current plan remains appropriate    Co-evaluation             End of Session Equipment Utilized During Treatment: Gait belt Activity Tolerance: Patient tolerated treatment well Patient left: in chair;with call bell/phone within reach;with nursing/sitter in room;with family/visitor present     Time: [id number] PT Time Calculation (min) (ACUTE ONLY): 25 min  Charges:  $Gait Training: 23-37 mins                    G CodesAlvie Heidelberg:      Folan, Irma Roulhac A 10/30/2014, 3:34 PM Alvie HeidelbergShauna Folan, PT, DPT [phone]

## 2014-10-31 DIAGNOSIS — I69351 Hemiplegia and hemiparesis following cerebral infarction affecting right dominant side: Secondary | ICD-10-CM | POA: Diagnosis not present

## 2014-10-31 DIAGNOSIS — D638 Anemia in other chronic diseases classified elsewhere: Secondary | ICD-10-CM | POA: Diagnosis not present

## 2014-10-31 DIAGNOSIS — L0231 Cutaneous abscess of buttock: Secondary | ICD-10-CM | POA: Diagnosis not present

## 2014-10-31 DIAGNOSIS — E1159 Type 2 diabetes mellitus with other circulatory complications: Secondary | ICD-10-CM | POA: Diagnosis not present

## 2014-10-31 DIAGNOSIS — R6510 Systemic inflammatory response syndrome (SIRS) of non-infectious origin without acute organ dysfunction: Secondary | ICD-10-CM | POA: Diagnosis not present

## 2014-10-31 DIAGNOSIS — F0390 Unspecified dementia without behavioral disturbance: Secondary | ICD-10-CM | POA: Diagnosis not present

## 2014-11-01 LAB — CULTURE, ROUTINE-ABSCESS

## 2014-11-02 ENCOUNTER — Telehealth: Payer: Self-pay | Admitting: *Deleted

## 2014-11-02 LAB — CULTURE, BLOOD (ROUTINE X 2)
Culture: NO GROWTH
Culture: NO GROWTH

## 2014-11-02 NOTE — Telephone Encounter (Signed)
Called Lisa no answer LMOM response on vm...Raechel Chute/lmb

## 2014-11-02 NOTE — Telephone Encounter (Signed)
Left smg on triage stating pt has a wound on her buttock area. Measures 05-01, 7cm deep. Has a hard place artound it, but it is draining. Currently on two antibiotic for it. Requesting verbal order for wound care...Raechel Chute/lmb

## 2014-11-02 NOTE — Telephone Encounter (Signed)
Ok Thx 

## 2014-11-02 NOTE — Discharge Summary (Signed)
Physician Discharge Summary  Tracy [name] ZOX:[id number] DOB: 01/14/1941 DOA: 10/26/2014  PCP: Sonda PrimesAlex Plotnikov, MD  Admit date: 10/26/2014 Discharge date: 10/30/2014  Time spent: 30  minutes  Recommendations for Outpatient Follow-up:  1. Follow up with PCP in 1 to2 weeks before the antibiotics are finished 2. Patient refused SNF, and wanted to go home with home health PT 3. Follow up with neurology as recommended in 4 weeks.   Discharge Diagnoses:  Principal Problem:   Delirium Active Problems:   HTN (hypertension)   History of CVA (cerebrovascular accident)   Dementia   Tachycardia   SIRS (systemic inflammatory response syndrome)   Stroke   Type 2 diabetes mellitus with other circulatory complications   Fever   Discharge Condition: improved  Diet recommendation: low sodium diet.   Filed Weights   10/26/14 1519  Weight: 73.029 kg (161 lb)    History of present illness:  The patient is a 74 year old female with history of hypertension, CVA 3 years ago with chronic right-sided deficits, slurred speech. She was brought in by her son due to loss of balance and increased confusion. She also complained of a mild frontal headache. In the emergency department, she denied cough, fever, chills, skin rash, ulcer, abdominal pain, nausea, vomiting. She had diarrhea for the past couple of weeks prior to admission but states that it was resolved before coming to the hospital. She was delirious in the emergency department and kept wanting to leave AGAINST MEDICAL ADVICE to take care of her children who are grown up but who she claims were 9, 2010, and 74 years old. No obvious source of infection was identified. There was some question of a possible stroke initially and upon further review on 2/16, the neurologist feels that she probably did have a small stroke. Therefore, she is undergoing the complete stroke workup. Instead of MRA brain and carotid duplex, we have ordered a CT angiogram of the  head and neck as we do not feel it patient will be able to lie still for a full MRA and she is anxious to leave. Her CT angiogram of her head and neck does not show any intra or extra cranial flow reduing lesion. But she was febrile of 102 on 2/17, . She is undergoing septic work up and a venous dopplers ordered and is negative for acute DVT. She was found to have aboil on the back of the thigh. The boil burst and she has mild cellulitis changes surrounding it. She was started on antibiotic and her cultures came back negative.   Hospital Course:  Delirium superimposed on dementia secondary to possible occult infection, unclear source. Afebrile, WBC trending down somewhat, but still elevated. No clear signs of infection on exam, although she has poor dentition. I suspect that she may have been somewhat dehydrated. Per son she is at her baseline mentation currently. Blood cultures have been negative so far, her repeat CXR is negative for infection. She denies any cough or sob. She denies any dysuria symptoms.  She denies having a new rash. Venous duplex ordered to evaluate for DVT and it was negative . She continues to have persistent leukocytosis. procalcitonin levels ordered, and is 0.86.  TSH, b12 levels will be ordered and are pending.   Possible small acute stroke, vs small vessel disease Neurology consulted and aspirin changed to plavix 75 mg daily.  Her hgba1c is 5.9 and LDL is 68. CT angio of the head and neck did not reveal any dissection or  intra or extra cranial lesions.    Diabetes mellitus  - Discontinued metformin in the hospital  . Liberalize diet.  hgba1c is 5.9%. CBG (last 3)   Recent Labs (last 2 labs)      Recent Labs  10/29/14 0700 10/29/14 1207 10/29/14 1642  GLUCAP 89 96 72        Hypertension, blood pressures soft, and she required a bolus of NS earlier this am for an episode of hypotension.  Her BP has improved after the bolus and we continue to  hold the BP meds.  Her bp after the bolus are normal.   Mild cellulitis around the boil: Started on IV antibiotics. And later discharged on oral to complete the course.    Tachycardia; 12 lead EKG shows sinus tachycardia. Resolved on discharge.   Hypokalemia, replaced.   Chronic constipation, continue MiraLAX  Normocytic anemia, likely anemia of chronic disease and hemoglobin approximately stable from baseline. anemia panel to check b12 and TSH ARE ALL WITHIN NORMAL LIMITS. Marland Kitchen       Procedures:  CTA of the head and neck  Consultations:  Neurology.   Discharge Exam: Filed Vitals:   10/30/14 1302  BP: 107/67  Pulse:   Temp:   Resp:     General: alert afebrile comfortable Cardiovascular: s1s2 Respiratory: ctab  Discharge Instructions   Discharge Instructions    Ambulatory referral to Neurology    Complete by:  As directed   Pt will follow up with Dr. Roda Shutters at [facility] in about 2 months. Thanks.     Discharge instructions    Complete by:  As directed   Follow u p with PCP in one week. Follow up with Neurology as recommended.          Discharge Medication List as of 10/30/2014  4:05 PM    START taking these medications   Details  clindamycin (CLEOCIN) 300 MG capsule Take 1 capsule (300 mg total) by mouth 3 (three) times daily., Starting 10/30/2014, Until Discontinued, Print    levofloxacin (LEVAQUIN) 500 MG tablet Take 1 tablet (500 mg total) by mouth daily., Starting 10/30/2014, Until Discontinued, Print    pravastatin (PRAVACHOL) 10 MG tablet Take 1 tablet (10 mg total) by mouth daily at 6 PM., Starting 10/30/2014, Until Discontinued, Print    vitamin B-12 1000 MCG tablet Take 1 tablet (1,000 mcg total) by mouth daily., Starting 10/30/2014, Until Discontinued, Print      CONTINUE these medications which have NOT CHANGED   Details  acetaminophen (TYLENOL) 325 MG tablet Take 650 mg by mouth every 6 (six) hours as needed for pain., Until Discontinued, Historical Med     clopidogrel (PLAVIX) 75 MG tablet Take 1 tablet (75 mg total) by mouth daily., Starting 10/05/2014, Until Discontinued, Normal    loratadine (CLARITIN) 10 MG tablet Take 10 mg by mouth daily as needed for allergies. , Until Discontinued, Historical Med    metFORMIN (GLUCOPHAGE) 500 MG tablet Take 2 tablets (1,000 mg total) by mouth 2 (two) times daily., Starting 10/05/2014, Until Discontinued, Normal    PARoxetine (PAXIL) 10 MG tablet Take 1 tablet (10 mg total) by mouth daily., Starting 10/05/2014, Until Discontinued, Normal    polyethylene glycol powder (GLYCOLAX/MIRALAX) powder Take 17 g by mouth 2 (two) times daily as needed., Starting 02/03/2014, Until Discontinued, Print    spironolactone (ALDACTONE) 25 MG tablet Take 1 tablet (25 mg total) by mouth daily., Starting 10/05/2014, Until Discontinued, Normal       Allergies  Allergen  Reactions  . Wellbutrin [Bupropion] Other (See Comments)    Wt loss   Follow-up Information    Follow up with Xu,Jindong, MD. Schedule an appointment as soon as possible for a visit in 2 months.   Specialty:  Neurology   Why:  stroke clinic   Contact information:   [address] [place] [address] [phone]       Follow up with [facility].   Why:  they will do your home health care services at discharge   Contact information:   [address] [place] [address] [phone]        The results of significant diagnostics from this hospitalization (including imaging, microbiology, ancillary and laboratory) are listed below for reference.    Significant Diagnostic Studies: Ct Angio Head W/cm &/or Wo Cm  10/27/2014   CLINICAL DATA:  Possible stroke. Fall with altered mental status. Equivocally abnormal MRI showing possible midbrain stroke. Stroke risk factors include hypertension and diabetes, as well as previous infarcts.  EXAM: CT ANGIOGRAPHY HEAD AND NECK  TECHNIQUE: Multidetector CT imaging of the  head and neck was performed using the standard protocol during bolus administration of intravenous contrast. Multiplanar CT image reconstructions and MIPs were obtained to evaluate the vascular anatomy. Carotid stenosis measurements (when applicable) are obtained utilizing NASCET criteria, using the distal internal carotid diameter as the denominator.  CONTRAST:  50mL OMNIPAQUE IOHEXOL 350 MG/ML SOLN  COMPARISON:  MRI brain 10/26/2014.  FINDINGS: CT HEAD  Calvarium and skull base: No fracture or destructive lesion. Mastoids and middle ears are grossly clear.  Paranasal sinuses: Imaged portions are clear.  Orbits: Negative.  Brain: No evidence of acute abnormality, including acute infarct, hemorrhage, hydrocephalus, or mass lesion. Atrophy with chronic microvascular ischemia. Remote areas of chronic infarction.  CTA NECK  Aortic arch: Bovine arch. Imaged portion shows no evidence of aneurysm or dissection. No significant stenosis of the major arch vessel origins.  Right carotid system: No evidence of dissection, stenosis (50% or greater) or occlusion.  Left carotid system: No evidence of dissection, stenosis (50% or greater) or occlusion.  Vertebral arteries: Codominant. No evidence of dissection, stenosis (50% or greater) or occlusion.  Non vascular structures: Spondylosis. No lung apex lesion. Subcentimeter RIGHT thyroid lesion, likely cyst.  CTA HEAD  Anterior circulation: No significant stenosis, proximal occlusion, aneurysm, or vascular malformation.  Posterior circulation: No significant stenosis, proximal occlusion, aneurysm, or vascular malformation.  Venous sinuses: As permitted by contrast timing, patent.  Anatomic variants: None of significance.  Delayed phase:   No abnormal intracranial enhancement.  IMPRESSION: No extracranial or intracranial flow reducing lesion. No evidence for dissection acute intracranial abnormality.   Electronically Signed   By: Davonna Belling M.D.   On: 10/27/2014 19:12   Dg Chest  2 View  10/28/2014   CLINICAL DATA:  Fever onset today with mild weakness.  EXAM: CHEST  2 VIEW  COMPARISON:  10/26/2014 and 07/29/2008  FINDINGS: Lungs are adequately inflated without focal consolidation or effusion. There is mild stable cardiomegaly. Mild prominence of the main pulmonary artery segment. There are mild degenerative changes of the spine.  IMPRESSION: No active cardiopulmonary disease.   Electronically Signed   By: Elberta Fortis M.D.   On: 10/28/2014 15:42   Dg Chest 2 View  10/26/2014   CLINICAL DATA:  Systemic inflammatory response syndrome. Shortness of breath.  EXAM: CHEST  2 VIEW  COMPARISON:  PA and lateral chest 07/29/2008.  CT  chest 07/20/2008.  FINDINGS: The lungs are clear. Heart size is upper normal. No pneumothorax or pleural effusion.  IMPRESSION: No acute disease.   Electronically Signed   By: Drusilla Kanner M.D.   On: 10/26/2014 22:45   Ct Head Wo Contrast  10/26/2014   CLINICAL DATA:  74 year old female with altered level of consciousness. Fall. Pain in the forehead.  EXAM: CT HEAD WITHOUT CONTRAST  TECHNIQUE: Contiguous axial images were obtained from the base of the skull through the vertex without intravenous contrast.  COMPARISON:  PET-CT 07/29/2008.  FINDINGS: Multiple well-defined foci of low attenuation in the basal ganglia bilaterally, similar to the prior examination from 07/29/2008, compatible with old lacunar infarcts. Faint physiologic calcifications in the right basal ganglia again noted. Patchy areas of mild decreased attenuation are noted throughout the deep and periventricular white matter of the cerebral hemispheres bilaterally, compatible with mild chronic microvascular ischemic disease. No acute intracranial abnormalities. Specifically, no evidence of acute intracranial hemorrhage, no definite findings of acute/subacute cerebral ischemia, no mass, mass effect, hydrocephalus or abnormal intra or extra-axial fluid collections. Visualized paranasal sinuses and  mastoids are well pneumatized. No acute displaced skull fractures are identified.  IMPRESSION: 1. No acute intracranial abnormalities. 2. Mild chronic microvascular ischemic changes in the cerebral white matter and multiple old bilateral basal ganglia lacunar infarctions redemonstrated, as above.   Electronically Signed   By: Trudie Reed M.D.   On: 10/26/2014 17:46   Ct Angio Neck W/cm &/or Wo/cm  10/27/2014   CLINICAL DATA:  Possible stroke. Fall with altered mental status. Equivocally abnormal MRI showing possible midbrain stroke. Stroke risk factors include hypertension and diabetes, as well as previous infarcts.  EXAM: CT ANGIOGRAPHY HEAD AND NECK  TECHNIQUE: Multidetector CT imaging of the head and neck was performed using the standard protocol during bolus administration of intravenous contrast. Multiplanar CT image reconstructions and MIPs were obtained to evaluate the vascular anatomy. Carotid stenosis measurements (when applicable) are obtained utilizing NASCET criteria, using the distal internal carotid diameter as the denominator.  CONTRAST:  50mL OMNIPAQUE IOHEXOL 350 MG/ML SOLN  COMPARISON:  MRI brain 10/26/2014.  FINDINGS: CT HEAD  Calvarium and skull base: No fracture or destructive lesion. Mastoids and middle ears are grossly clear.  Paranasal sinuses: Imaged portions are clear.  Orbits: Negative.  Brain: No evidence of acute abnormality, including acute infarct, hemorrhage, hydrocephalus, or mass lesion. Atrophy with chronic microvascular ischemia. Remote areas of chronic infarction.  CTA NECK  Aortic arch: Bovine arch. Imaged portion shows no evidence of aneurysm or dissection. No significant stenosis of the major arch vessel origins.  Right carotid system: No evidence of dissection, stenosis (50% or greater) or occlusion.  Left carotid system: No evidence of dissection, stenosis (50% or greater) or occlusion.  Vertebral arteries: Codominant. No evidence of dissection, stenosis (50% or  greater) or occlusion.  Non vascular structures: Spondylosis. No lung apex lesion. Subcentimeter RIGHT thyroid lesion, likely cyst.  CTA HEAD  Anterior circulation: No significant stenosis, proximal occlusion, aneurysm, or vascular malformation.  Posterior circulation: No significant stenosis, proximal occlusion, aneurysm, or vascular malformation.  Venous sinuses: As permitted by contrast timing, patent.  Anatomic variants: None of significance.  Delayed phase:   No abnormal intracranial enhancement.  IMPRESSION: No extracranial or intracranial flow reducing lesion. No evidence for dissection acute intracranial abnormality.   Electronically Signed   By: Davonna Belling M.D.   On: 10/27/2014 19:12   Mr Brain Wo Contrast  10/26/2014   CLINICAL DATA:  Fall.  Altered mental status.  Rule out stroke.  EXAM: MRI HEAD WITHOUT CONTRAST  TECHNIQUE: Multiplanar, multiecho pulse sequences of the brain and surrounding structures were obtained without intravenous contrast.  COMPARISON:  CT head 10/26/2014  FINDINGS: Possible small area of acute infarct in the central midbrain. This cannot be confirmed on coronal diffusion-weighted imaging but does appear to have restricted diffusion measuring approximately 3 mm. No other acute infarct  Age-appropriate atrophy. Chronic infarcts in the basal ganglia bilaterally and left thalamus. Minimal chronic microvascular ischemia in the white matter.  Negative for hemorrhage or fluid collection  Negative for mass or edema.  IMPRESSION: Possible 3 mm acute infarct in the central midbrain, not confirmed on coronal diffusion.  Atrophy and chronic microvascular ischemia.   Electronically Signed   By: Marlan Palau M.D.   On: 10/26/2014 20:41   Dg Hip Unilat With Pelvis 2-3 Views Right  10/26/2014   CLINICAL DATA:  Status post fall was 3 days ago, difficulty walking now due to right hip pain  EXAM: RIGHT HIP (WITH PELVIS) 2-3 VIEWS  COMPARISON:  Coronal and sagittal reconstructed images from  an abdominal and pelvic CT scan dated August 18, 2013.  FINDINGS: The bony pelvis is adequately mineralized. There are mild degenerative changes of the lower lumbar spine. The hip joint spaces are reasonably well maintained bilaterally. AP and lateral views of the right hip reveal the articular surfaces of the acetabulum and femoral head to be smoothly rounded. The femoral neck and intertrochanteric regions are normal. The soft tissues overlying the pelvis and right hip are unremarkable.  IMPRESSION: There is no acute or significant chronic bony abnormality of the pelvis or right hip. There is mild degenerative disc disease of the lower lumbar spine.   Electronically Signed   By: David  [place]   On: 10/26/2014 16:16    Microbiology: Recent Results (from the past 240 hour(s))  Culture, blood (routine x 2)     Status: None (Preliminary result)   Collection Time: 10/26/14 11:06 PM  Result Value Ref Range Status   Specimen Description BLOOD RIGHT ARM  Final   Special Requests BOTTLES DRAWN AEROBIC ONLY 3CC  Final   Culture   Final           BLOOD CULTURE RECEIVED NO GROWTH TO DATE CULTURE WILL BE HELD FOR 5 DAYS BEFORE ISSUING A FINAL NEGATIVE REPORT Performed at [facility]    Report Status PENDING  Incomplete  Culture, blood (routine x 2)     Status: None (Preliminary result)   Collection Time: 10/26/14 11:17 PM  Result Value Ref Range Status   Specimen Description BLOOD RIGHT ARM  Final   Special Requests BOTTLES DRAWN AEROBIC ONLY 5CC  Final   Culture   Final           BLOOD CULTURE RECEIVED NO GROWTH TO DATE CULTURE WILL BE HELD FOR 5 DAYS BEFORE ISSUING A FINAL NEGATIVE REPORT Performed at [facility]    Report Status PENDING  Incomplete  Culture, routine-abscess     Status: None   Collection Time: 10/29/14  6:20 AM  Result Value Ref Range Status   Specimen Description ABSCESS BUTTOCKS RIGHT  Final   Special Requests RIGHT LOWER BUTTOCK NEAR INNER THIGH  Final    Gram Stain   Final    FEW WBC PRESENT, PREDOMINANTLY PMN NO SQUAMOUS EPITHELIAL CELLS SEEN MODERATE GRAM POSITIVE COCCI IN PAIRS FEW GRAM POSITIVE RODS FEW GRAM NEGATIVE RODS  Culture   Final    MULTIPLE ORGANISMS PRESENT, NONE PREDOMINANT Note: NO STAPHYLOCOCCUS AUREUS ISOLATED NO GROUP A STREP (S.PYOGENES) ISOLATED Performed at [facility]    Report Status 11/01/2014 FINAL  Final  Culture, Urine     Status: None   Collection Time: 10/29/14  2:41 PM  Result Value Ref Range Status   Specimen Description URINE, RANDOM  Final   Special Requests NONE  Final   Colony Count NO GROWTH Performed at [facility]   Final   Culture NO GROWTH Performed at [facility]   Final   Report Status 10/30/2014 FINAL  Final     Labs: Basic Metabolic Panel:  Recent Labs Lab 10/26/14 1529 10/27/14 0546 10/28/14 0628 10/29/14 1648  NA 138 142 137 138  K 3.0* 3.6 4.0 4.9  CL 102 109 108 109  CO2 31 23 20  18*  GLUCOSE 108* 96 83 72  BUN 14 15 12 11   CREATININE 0.98 0.97 1.09 0.87  CALCIUM 8.5 8.2* 8.0* 8.3*   Liver Function Tests:  Recent Labs Lab 10/26/14 1529  AST 16  ALT 9  ALKPHOS 58  BILITOT 1.3*  PROT 6.3  ALBUMIN 3.7   No results for input(s): LIPASE, AMYLASE in the last 168 hours. No results for input(s): AMMONIA in the last 168 hours. CBC:  Recent Labs Lab 10/26/14 1529 10/27/14 0546 10/28/14 0628 10/29/14 0803 10/30/14 0848  WBC 14.0* 13.5* 16.4* 16.1* 11.6*  HGB 10.3* 10.6* 9.4* 10.4* 9.7*  HCT 30.2* 32.0* 27.8* 31.1* 28.6*  MCV 89.3 88.2 88.3 88.4 88.8  PLT 302 255 272 359 375   Cardiac Enzymes: No results for input(s): CKTOTAL, CKMB, CKMBINDEX, TROPONINI in the last 168 hours. BNP: BNP (last 3 results) No results for input(s): BNP in the last 8760 hours.  ProBNP (last 3 results) No results for input(s): PROBNP in the last 8760 hours.  CBG:  Recent Labs Lab 10/29/14 1207 10/29/14 1642 10/29/14 2145 10/30/14 0606  10/30/14 1121  GLUCAP 96 72 81 82 73       Signed:  Mildred Bollard  [facility] 11/02/2014, 1:08 AM

## 2014-11-04 ENCOUNTER — Ambulatory Visit (INDEPENDENT_AMBULATORY_CARE_PROVIDER_SITE_OTHER): Payer: Commercial Managed Care - HMO | Admitting: Internal Medicine

## 2014-11-04 ENCOUNTER — Encounter: Payer: Self-pay | Admitting: Internal Medicine

## 2014-11-04 VITALS — BP 120/70 | HR 90 | Temp 97.6°F | Wt 166.2 lb

## 2014-11-04 DIAGNOSIS — E11628 Type 2 diabetes mellitus with other skin complications: Secondary | ICD-10-CM

## 2014-11-04 DIAGNOSIS — F0390 Unspecified dementia without behavioral disturbance: Secondary | ICD-10-CM | POA: Diagnosis not present

## 2014-11-04 DIAGNOSIS — L0231 Cutaneous abscess of buttock: Secondary | ICD-10-CM | POA: Diagnosis not present

## 2014-11-04 DIAGNOSIS — E1159 Type 2 diabetes mellitus with other circulatory complications: Secondary | ICD-10-CM | POA: Diagnosis not present

## 2014-11-04 DIAGNOSIS — I639 Cerebral infarction, unspecified: Secondary | ICD-10-CM

## 2014-11-04 MED ORDER — DOXYCYCLINE HYCLATE 100 MG PO TABS: 100.0000 mg | ORAL_TABLET | Freq: Two times a day (BID) | ORAL | Status: DC

## 2014-11-04 MED ORDER — METFORMIN HCL 1000 MG PO TABS: 1000.0000 mg | ORAL_TABLET | Freq: Two times a day (BID) | ORAL | Status: DC

## 2014-11-04 NOTE — Assessment & Plan Note (Signed)
Cont w/Metformin 

## 2014-11-04 NOTE — Progress Notes (Signed)
Subjective:    HPI  Post-Hosp f/u visit:   Admit date: 10/26/2014 Discharge date: 10/30/2014  Time spent: 30 minutes  Recommendations for Outpatient Follow-up:  1. Follow up with PCP in 1 to2 weeks before the antibiotics are finished 2. Patient refused SNF, and wanted to go home with home health PT 3. Follow up with neurology as recommended in 4 weeks.  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   Filed Weights   10/26/14 1519  Weight: 73.029 kg (161 lb)    History of present illness:  The patient is a 74 year old female with history of hypertension, CVA 3 years ago with chronic right-sided deficits, slurred speech. She was brought in by her son due to loss of balance and increased confusion. She also complained of a mild frontal headache. In the emergency department, she denied cough, fever, chills, skin rash, ulcer, abdominal pain, nausea, vomiting. She had diarrhea for the past couple of weeks prior to admission but states that it was resolved before coming to the hospital. She was delirious in the emergency department and kept wanting to leave AGAINST MEDICAL ADVICE to take care of her children who are grown up but who she claims were 9, 5510, and 74 years old. No obvious source of infection was identified. There was some question of a possible stroke initially and upon further review on 2/16, the neurologist feels that she probably did have a small stroke. Therefore, she is undergoing the complete stroke workup. Instead of MRA brain and carotid duplex, we have ordered a CT angiogram of the head and neck as we do not feel it patient will be able to  lie still for a full MRA and she is anxious to leave. Her CT angiogram of her head and neck does not show any intra or extra cranial flow reduing lesion. But she was febrile of 102 on 2/17, . She is undergoing septic work up and a venous dopplers ordered and is negative for acute DVT. She was found to have aboil on the back of the thigh. The boil burst and she has mild cellulitis changes surrounding it. She was started on antibiotic and her cultures came back negative.   Hospital Course:  Delirium superimposed on dementia secondary to possible occult infection, unclear source. Afebrile, WBC trending down somewhat, but still elevated. No clear signs of infection on exam, although she has poor dentition. I suspect that she may have been somewhat dehydrated. Per son she is at her baseline mentation currently. Blood cultures have been negative so far, her repeat CXR is negative for infection. She denies any cough or sob. She denies any dysuria symptoms.  She denies having a new rash. Venous duplex ordered to evaluate for DVT and it was negative . She continues to have persistent leukocytosis. procalcitonin levels ordered, and is 0.86.  TSH, b12 levels will be ordered and are pending.   Possible small acute stroke, vs small vessel disease Neurology consulted and aspirin changed to plavix 75 mg daily.  Her hgba1c is 5.9 and LDL is 68. CT angio of the head and neck did not reveal any dissection or intra or extra cranial lesions.    Diabetes mellitus  - Discontinued metformin in the hospital .  Liberalize diet.  hgba1c is 5.9%. CBG (last 3)   Recent Labs (last 2 labs)     Recent Labs  10/29/14 0700 10/29/14 1207 10/29/14 1642  GLUCAP 89 96 72        Hypertension, blood pressures soft, and she required a bolus of NS earlier this am for an episode of hypotension.  Her BP has improved after the bolus and we continue to hold the BP meds.  Her bp after the bolus are  normal.   Mild cellulitis around the boil: Started on IV antibiotics. And later discharged on oral to complete the course.    Tachycardia; 12 lead EKG shows sinus tachycardia. Resolved on discharge.   Hypokalemia, replaced.   Chronic constipation, continue MiraLAX  Normocytic anemia, likely anemia of chronic disease and hemoglobin approximately stable from baseline. anemia panel to check b12 and TSH ARE ALL WITHIN NORMAL LIMITS. Marland Kitchen       Procedures:  CTA of the head and neck  Consultations:  Neurology.       F/u wt loss, nausea x 2 months, no appetite -much better F/u on her CVA, weakness, HTN  f/u diarrhea and sometimes constipation - chronic  Review of Systems  Constitutional: Positive for fatigue. Negative for fever, chills, diaphoresis, activity change, appetite change and unexpected weight change.  HENT: Negative for congestion, dental problem, ear pain, hearing loss, mouth sores, postnasal drip, sinus pressure, sneezing, sore throat and voice change.   Eyes: Negative for pain and visual disturbance.  Respiratory: Negative for cough, chest tightness, shortness of breath, wheezing and stridor.   Cardiovascular: Negative for chest pain, palpitations and leg swelling.  Gastrointestinal: Positive for nausea and constipation. Negative for vomiting, abdominal pain, diarrhea, blood in stool, abdominal distention and rectal pain.  Genitourinary: Negative for dysuria, hematuria, flank pain, decreased urine volume, vaginal bleeding, vaginal discharge, difficulty urinating, vaginal pain and menstrual problem.  Musculoskeletal: Negative for back pain, joint swelling, gait problem and neck pain.  Skin: Negative for color change, rash and wound.  Neurological: Negative for dizziness, tremors, syncope, speech difficulty and light-headedness.  Hematological: Negative for adenopathy.  Psychiatric/Behavioral: Negative for suicidal ideas, hallucinations, behavioral problems,  confusion, sleep disturbance, dysphoric mood and decreased concentration. The patient is nervous/anxious. The patient is not hyperactive.     Wt Readings from Last 3 Encounters:  11/04/14 166 lb 4 oz (75.411 kg)  10/26/14 161 lb (73.029 kg)  10/05/14 171 lb (77.565 kg)   BP Readings from Last 3 Encounters:  11/04/14 120/70  10/30/14 107/67  10/05/14 120/70       Objective:   Physical Exam  Constitutional: She appears well-developed. No distress.  HENT:  Head: Normocephalic.  Right Ear: External ear normal.  Left Ear: External ear normal.  Nose: Nose normal.  Mouth/Throat: Oropharynx is clear and moist.  Eyes: Conjunctivae are normal. Pupils are equal, round, and reactive to light. Right eye exhibits no discharge. Left eye exhibits no discharge.  Neck: Normal range of motion. Neck supple. No JVD present. No tracheal deviation present. No thyromegaly present.  Cardiovascular: Normal rate, regular rhythm and normal heart sounds.   Pulmonary/Chest: No stridor. No respiratory distress. She has no wheezes.  Abdominal: Soft. Bowel sounds are normal. She exhibits mass. She exhibits no distension. There is no tenderness. There is no rebound and no guarding.  Musculoskeletal: She exhibits no edema or tenderness.  Lymphadenopathy:    She has no cervical adenopathy.  Neurological: She displays normal reflexes. A cranial nerve deficit is  present. She exhibits normal muscle tone. Coordination normal.  R facial droop  weakness R side  Skin: No rash noted. No erythema.  Psychiatric: She has a normal mood and affect. Her behavior is normal.  Umbil hernia R buttock 5x2 cm sq abscess  Lab Results  Component Value Date                                                                                                       Procedure note:  Incision and Drainage of an Abscess - R buttock   Indication : a localized collection of pus that is tender and not spontaneously  resolving.    Risks including unsuccessful procedure , possible need for a repeat procedure due to pus accumulation, scar formation, and others as well as benefits were explained to the patient in detail. Written consent was obtained/signed.    The patient was placed in a decubitus position. The area of an abscess was prepped with povidone-iodine and draped in a sterile fashion. Local anesthesia with   2    cc of 2% lidocaine and epinephrine  was administered.  1 cm incision with #11strait blade was made. About 3 cc of purulent material was expressed. The abscess cavity was explored with a sterile hemostat and the walled- off pockets and septae were broken down bluntly. The cavity was irrigated with the rest of the anesthetic in the syringe and packed with 3 inches of  the iodoform gauze.   The wound was dressed with antibiotic ointment and Telfa pad.  Tolerated well. Complications: None.   Wound instructions provided.   Wound instructions : change dressing once a day or twice a day is needed. Change dressing after  shower in the morning.  Pat dry the wound with gauze. Pull out one inch of packing everyday and cut it off. Re-dress wound with antibiotic ointment and Telfa pad or a Band-Aid of appropriate size.   Please contact us if you notice a recollection of pus in the abscess fever and chills increased pain redness red streaks near the abscess increased swelling in the area.       Assessment & Plan:

## 2014-11-04 NOTE — Progress Notes (Signed)
Pre visit review using our clinic review tool, if applicable. No additional management support is needed unless otherwise documented below in the visit note. 

## 2014-11-04 NOTE — Patient Instructions (Signed)
    Wound instructions provided.   Wound instructions : change dressing once a day or twice a day is needed. Change dressing after  shower in the morning.  Pat dry the wound with gauze. Pull out one inch of packing everyday and cut it off. Re-dress wound with antibiotic ointment and Telfa pad or a Band-Aid of appropriate size.   Please contact us if you notice a recollection of pus in the abscess fever and chills increased pain redness red streaks near the abscess increased swelling in the area.  

## 2014-11-05 DIAGNOSIS — F0390 Unspecified dementia without behavioral disturbance: Secondary | ICD-10-CM | POA: Diagnosis not present

## 2014-11-05 DIAGNOSIS — R6510 Systemic inflammatory response syndrome (SIRS) of non-infectious origin without acute organ dysfunction: Secondary | ICD-10-CM | POA: Diagnosis not present

## 2014-11-05 DIAGNOSIS — D638 Anemia in other chronic diseases classified elsewhere: Secondary | ICD-10-CM | POA: Diagnosis not present

## 2014-11-05 DIAGNOSIS — E1159 Type 2 diabetes mellitus with other circulatory complications: Secondary | ICD-10-CM | POA: Diagnosis not present

## 2014-11-05 DIAGNOSIS — L0231 Cutaneous abscess of buttock: Secondary | ICD-10-CM | POA: Diagnosis not present

## 2014-11-05 DIAGNOSIS — I69351 Hemiplegia and hemiparesis following cerebral infarction affecting right dominant side: Secondary | ICD-10-CM | POA: Diagnosis not present

## 2014-11-06 DIAGNOSIS — D638 Anemia in other chronic diseases classified elsewhere: Secondary | ICD-10-CM | POA: Diagnosis not present

## 2014-11-06 DIAGNOSIS — L0231 Cutaneous abscess of buttock: Secondary | ICD-10-CM | POA: Diagnosis not present

## 2014-11-06 DIAGNOSIS — I69351 Hemiplegia and hemiparesis following cerebral infarction affecting right dominant side: Secondary | ICD-10-CM | POA: Diagnosis not present

## 2014-11-06 DIAGNOSIS — R6510 Systemic inflammatory response syndrome (SIRS) of non-infectious origin without acute organ dysfunction: Secondary | ICD-10-CM | POA: Diagnosis not present

## 2014-11-06 DIAGNOSIS — F0390 Unspecified dementia without behavioral disturbance: Secondary | ICD-10-CM | POA: Diagnosis not present

## 2014-11-06 DIAGNOSIS — E1159 Type 2 diabetes mellitus with other circulatory complications: Secondary | ICD-10-CM | POA: Diagnosis not present

## 2014-11-07 DIAGNOSIS — E1159 Type 2 diabetes mellitus with other circulatory complications: Secondary | ICD-10-CM | POA: Diagnosis not present

## 2014-11-07 DIAGNOSIS — F0390 Unspecified dementia without behavioral disturbance: Secondary | ICD-10-CM | POA: Diagnosis not present

## 2014-11-07 DIAGNOSIS — L0231 Cutaneous abscess of buttock: Secondary | ICD-10-CM | POA: Diagnosis not present

## 2014-11-07 DIAGNOSIS — R6510 Systemic inflammatory response syndrome (SIRS) of non-infectious origin without acute organ dysfunction: Secondary | ICD-10-CM | POA: Diagnosis not present

## 2014-11-07 DIAGNOSIS — I69351 Hemiplegia and hemiparesis following cerebral infarction affecting right dominant side: Secondary | ICD-10-CM | POA: Diagnosis not present

## 2014-11-07 DIAGNOSIS — D638 Anemia in other chronic diseases classified elsewhere: Secondary | ICD-10-CM | POA: Diagnosis not present

## 2014-11-09 DIAGNOSIS — E1159 Type 2 diabetes mellitus with other circulatory complications: Secondary | ICD-10-CM | POA: Diagnosis not present

## 2014-11-09 DIAGNOSIS — D638 Anemia in other chronic diseases classified elsewhere: Secondary | ICD-10-CM | POA: Diagnosis not present

## 2014-11-09 DIAGNOSIS — L0231 Cutaneous abscess of buttock: Secondary | ICD-10-CM | POA: Diagnosis not present

## 2014-11-09 DIAGNOSIS — F0390 Unspecified dementia without behavioral disturbance: Secondary | ICD-10-CM | POA: Diagnosis not present

## 2014-11-09 DIAGNOSIS — R6510 Systemic inflammatory response syndrome (SIRS) of non-infectious origin without acute organ dysfunction: Secondary | ICD-10-CM | POA: Diagnosis not present

## 2014-11-09 DIAGNOSIS — I69351 Hemiplegia and hemiparesis following cerebral infarction affecting right dominant side: Secondary | ICD-10-CM | POA: Diagnosis not present

## 2014-11-09 NOTE — Assessment & Plan Note (Signed)
Discussed - no change 

## 2014-11-09 NOTE — Assessment & Plan Note (Signed)
Cont w/Metformin 

## 2014-11-09 NOTE — Assessment & Plan Note (Signed)
Sx's resolved Rx: Plavix and BP meds

## 2014-11-10 DIAGNOSIS — E1159 Type 2 diabetes mellitus with other circulatory complications: Secondary | ICD-10-CM | POA: Diagnosis not present

## 2014-11-10 DIAGNOSIS — F0390 Unspecified dementia without behavioral disturbance: Secondary | ICD-10-CM | POA: Diagnosis not present

## 2014-11-10 DIAGNOSIS — D638 Anemia in other chronic diseases classified elsewhere: Secondary | ICD-10-CM | POA: Diagnosis not present

## 2014-11-10 DIAGNOSIS — L0231 Cutaneous abscess of buttock: Secondary | ICD-10-CM | POA: Diagnosis not present

## 2014-11-10 DIAGNOSIS — R6510 Systemic inflammatory response syndrome (SIRS) of non-infectious origin without acute organ dysfunction: Secondary | ICD-10-CM | POA: Diagnosis not present

## 2014-11-10 DIAGNOSIS — I69351 Hemiplegia and hemiparesis following cerebral infarction affecting right dominant side: Secondary | ICD-10-CM | POA: Diagnosis not present

## 2014-11-12 DIAGNOSIS — L0231 Cutaneous abscess of buttock: Secondary | ICD-10-CM | POA: Diagnosis not present

## 2014-11-12 DIAGNOSIS — E1159 Type 2 diabetes mellitus with other circulatory complications: Secondary | ICD-10-CM | POA: Diagnosis not present

## 2014-11-12 DIAGNOSIS — F0390 Unspecified dementia without behavioral disturbance: Secondary | ICD-10-CM | POA: Diagnosis not present

## 2014-11-12 DIAGNOSIS — D638 Anemia in other chronic diseases classified elsewhere: Secondary | ICD-10-CM | POA: Diagnosis not present

## 2014-11-12 DIAGNOSIS — R6510 Systemic inflammatory response syndrome (SIRS) of non-infectious origin without acute organ dysfunction: Secondary | ICD-10-CM | POA: Diagnosis not present

## 2014-11-12 DIAGNOSIS — I69351 Hemiplegia and hemiparesis following cerebral infarction affecting right dominant side: Secondary | ICD-10-CM | POA: Diagnosis not present

## 2014-11-17 DIAGNOSIS — D638 Anemia in other chronic diseases classified elsewhere: Secondary | ICD-10-CM | POA: Diagnosis not present

## 2014-11-17 DIAGNOSIS — E1159 Type 2 diabetes mellitus with other circulatory complications: Secondary | ICD-10-CM | POA: Diagnosis not present

## 2014-11-17 DIAGNOSIS — I69351 Hemiplegia and hemiparesis following cerebral infarction affecting right dominant side: Secondary | ICD-10-CM | POA: Diagnosis not present

## 2014-11-17 DIAGNOSIS — L0231 Cutaneous abscess of buttock: Secondary | ICD-10-CM | POA: Diagnosis not present

## 2014-11-17 DIAGNOSIS — R6510 Systemic inflammatory response syndrome (SIRS) of non-infectious origin without acute organ dysfunction: Secondary | ICD-10-CM | POA: Diagnosis not present

## 2014-11-17 DIAGNOSIS — F0390 Unspecified dementia without behavioral disturbance: Secondary | ICD-10-CM | POA: Diagnosis not present

## 2014-11-18 ENCOUNTER — Telehealth: Payer: Self-pay | Admitting: Internal Medicine

## 2014-11-18 DIAGNOSIS — R6510 Systemic inflammatory response syndrome (SIRS) of non-infectious origin without acute organ dysfunction: Secondary | ICD-10-CM | POA: Diagnosis not present

## 2014-11-18 DIAGNOSIS — D638 Anemia in other chronic diseases classified elsewhere: Secondary | ICD-10-CM | POA: Diagnosis not present

## 2014-11-18 DIAGNOSIS — L0231 Cutaneous abscess of buttock: Secondary | ICD-10-CM | POA: Diagnosis not present

## 2014-11-18 DIAGNOSIS — I69351 Hemiplegia and hemiparesis following cerebral infarction affecting right dominant side: Secondary | ICD-10-CM | POA: Diagnosis not present

## 2014-11-18 DIAGNOSIS — F0390 Unspecified dementia without behavioral disturbance: Secondary | ICD-10-CM | POA: Diagnosis not present

## 2014-11-18 DIAGNOSIS — E1159 Type 2 diabetes mellitus with other circulatory complications: Secondary | ICD-10-CM | POA: Diagnosis not present

## 2014-11-18 NOTE — Telephone Encounter (Signed)
Patient has poor appetite.  Would like to know if there is a supplement patient could take that would not interfere with being lactose and tolerant and with her diabetes.

## 2014-11-19 DIAGNOSIS — I69351 Hemiplegia and hemiparesis following cerebral infarction affecting right dominant side: Secondary | ICD-10-CM | POA: Diagnosis not present

## 2014-11-19 DIAGNOSIS — E1159 Type 2 diabetes mellitus with other circulatory complications: Secondary | ICD-10-CM | POA: Diagnosis not present

## 2014-11-19 DIAGNOSIS — L0231 Cutaneous abscess of buttock: Secondary | ICD-10-CM | POA: Diagnosis not present

## 2014-11-19 DIAGNOSIS — R6510 Systemic inflammatory response syndrome (SIRS) of non-infectious origin without acute organ dysfunction: Secondary | ICD-10-CM | POA: Diagnosis not present

## 2014-11-19 DIAGNOSIS — D638 Anemia in other chronic diseases classified elsewhere: Secondary | ICD-10-CM | POA: Diagnosis not present

## 2014-11-19 DIAGNOSIS — F0390 Unspecified dementia without behavioral disturbance: Secondary | ICD-10-CM | POA: Diagnosis not present

## 2014-11-19 NOTE — Telephone Encounter (Signed)
OK Boost glucose control to try. Thx

## 2014-11-20 DIAGNOSIS — I69351 Hemiplegia and hemiparesis following cerebral infarction affecting right dominant side: Secondary | ICD-10-CM | POA: Diagnosis not present

## 2014-11-20 DIAGNOSIS — D638 Anemia in other chronic diseases classified elsewhere: Secondary | ICD-10-CM | POA: Diagnosis not present

## 2014-11-20 DIAGNOSIS — L0231 Cutaneous abscess of buttock: Secondary | ICD-10-CM | POA: Diagnosis not present

## 2014-11-20 DIAGNOSIS — R6510 Systemic inflammatory response syndrome (SIRS) of non-infectious origin without acute organ dysfunction: Secondary | ICD-10-CM | POA: Diagnosis not present

## 2014-11-20 DIAGNOSIS — E1159 Type 2 diabetes mellitus with other circulatory complications: Secondary | ICD-10-CM | POA: Diagnosis not present

## 2014-11-20 DIAGNOSIS — F0390 Unspecified dementia without behavioral disturbance: Secondary | ICD-10-CM | POA: Diagnosis not present

## 2014-11-20 NOTE — Telephone Encounter (Signed)
Tracy [name] informed of below.

## 2014-11-24 DIAGNOSIS — L0231 Cutaneous abscess of buttock: Secondary | ICD-10-CM | POA: Diagnosis not present

## 2014-11-24 DIAGNOSIS — F0390 Unspecified dementia without behavioral disturbance: Secondary | ICD-10-CM | POA: Diagnosis not present

## 2014-11-24 DIAGNOSIS — E1159 Type 2 diabetes mellitus with other circulatory complications: Secondary | ICD-10-CM | POA: Diagnosis not present

## 2014-11-24 DIAGNOSIS — R6510 Systemic inflammatory response syndrome (SIRS) of non-infectious origin without acute organ dysfunction: Secondary | ICD-10-CM | POA: Diagnosis not present

## 2014-11-24 DIAGNOSIS — D638 Anemia in other chronic diseases classified elsewhere: Secondary | ICD-10-CM | POA: Diagnosis not present

## 2014-11-24 DIAGNOSIS — I69351 Hemiplegia and hemiparesis following cerebral infarction affecting right dominant side: Secondary | ICD-10-CM | POA: Diagnosis not present

## 2014-11-26 DIAGNOSIS — F0390 Unspecified dementia without behavioral disturbance: Secondary | ICD-10-CM | POA: Diagnosis not present

## 2014-11-26 DIAGNOSIS — D638 Anemia in other chronic diseases classified elsewhere: Secondary | ICD-10-CM | POA: Diagnosis not present

## 2014-11-26 DIAGNOSIS — R6510 Systemic inflammatory response syndrome (SIRS) of non-infectious origin without acute organ dysfunction: Secondary | ICD-10-CM | POA: Diagnosis not present

## 2014-11-26 DIAGNOSIS — E1159 Type 2 diabetes mellitus with other circulatory complications: Secondary | ICD-10-CM | POA: Diagnosis not present

## 2014-11-26 DIAGNOSIS — I69351 Hemiplegia and hemiparesis following cerebral infarction affecting right dominant side: Secondary | ICD-10-CM | POA: Diagnosis not present

## 2014-11-26 DIAGNOSIS — L0231 Cutaneous abscess of buttock: Secondary | ICD-10-CM | POA: Diagnosis not present

## 2014-11-27 ENCOUNTER — Ambulatory Visit (INDEPENDENT_AMBULATORY_CARE_PROVIDER_SITE_OTHER): Payer: Commercial Managed Care - HMO | Admitting: Internal Medicine

## 2014-11-27 ENCOUNTER — Other Ambulatory Visit (INDEPENDENT_AMBULATORY_CARE_PROVIDER_SITE_OTHER): Payer: Commercial Managed Care - HMO

## 2014-11-27 VITALS — BP 128/80 | HR 81 | Temp 97.7°F | Resp 16 | Ht 65.0 in | Wt 155.0 lb

## 2014-11-27 DIAGNOSIS — F411 Generalized anxiety disorder: Secondary | ICD-10-CM | POA: Insufficient documentation

## 2014-11-27 DIAGNOSIS — I10 Essential (primary) hypertension: Secondary | ICD-10-CM

## 2014-11-27 DIAGNOSIS — E1159 Type 2 diabetes mellitus with other circulatory complications: Secondary | ICD-10-CM

## 2014-11-27 DIAGNOSIS — L0231 Cutaneous abscess of buttock: Secondary | ICD-10-CM | POA: Diagnosis not present

## 2014-11-27 DIAGNOSIS — F0390 Unspecified dementia without behavioral disturbance: Secondary | ICD-10-CM | POA: Diagnosis not present

## 2014-11-27 DIAGNOSIS — I639 Cerebral infarction, unspecified: Secondary | ICD-10-CM

## 2014-11-27 LAB — BASIC METABOLIC PANEL
BUN: 18 mg/dL (ref 6–23)
CALCIUM: 9.2 mg/dL (ref 8.4–10.5)
CO2: 30 mEq/L (ref 19–32)
CREATININE: 0.88 mg/dL (ref 0.40–1.20)
Chloride: 103 mEq/L (ref 96–112)
GFR: 80.92 mL/min (ref >60.00)
Glucose, Bld: 100 mg/dL — ABNORMAL HIGH (ref 70–99)
Potassium: 3.6 mEq/L (ref 3.5–5.1)
Sodium: 141 mEq/L (ref 135–145)

## 2014-11-27 LAB — HEMOGLOBIN A1C: Hgb A1c MFr Bld: 5.6 % (ref 4.6–6.5)

## 2014-11-27 MED ORDER — CETAPHIL DAILY ADVANCE EX LOTN: TOPICAL_LOTION | CUTANEOUS | Status: DC

## 2014-11-27 MED ORDER — ESCITALOPRAM OXALATE 5 MG PO TABS: 5.0000 mg | ORAL_TABLET | Freq: Every day | ORAL | Status: DC

## 2014-11-27 NOTE — Progress Notes (Signed)
   Subjective:    HPI   C/o palpitations w/Paxil F/u wt loss, nausea x 2 months, no appetite -much better F/u on her CVA, weakness, HTN  f/u diarrhea and sometimes constipation - chronic  Review of Systems  Constitutional: Positive for fatigue. Negative for fever, chills, diaphoresis, activity change, appetite change and unexpected weight change.  HENT: Negative for congestion, dental problem, ear pain, hearing loss, mouth sores, postnasal drip, sinus pressure, sneezing, sore throat and voice change.   Eyes: Negative for pain and visual disturbance.  Respiratory: Negative for cough, chest tightness, shortness of breath, wheezing and stridor.   Cardiovascular: Negative for chest pain, palpitations and leg swelling.  Gastrointestinal: Positive for nausea and constipation. Negative for vomiting, abdominal pain, diarrhea, blood in stool, abdominal distention and rectal pain.  Genitourinary: Negative for dysuria, hematuria, flank pain, decreased urine volume, vaginal bleeding, vaginal discharge, difficulty urinating, vaginal pain and menstrual problem.  Musculoskeletal: Negative for back pain, joint swelling, gait problem and neck pain.  Skin: Negative for color change, rash and wound.  Neurological: Negative for dizziness, tremors, syncope, speech difficulty and light-headedness.  Hematological: Negative for adenopathy.  Psychiatric/Behavioral: Negative for suicidal ideas, hallucinations, behavioral problems, confusion, sleep disturbance, dysphoric mood and decreased concentration. The patient is nervous/anxious. The patient is not hyperactive.     Wt Readings from Last 3 Encounters:  11/27/14 155 lb (70.308 kg)  11/04/14 166 lb 4 oz (75.411 kg)  10/26/14 161 lb (73.029 kg)   BP Readings from Last 3 Encounters:  11/27/14 128/80  11/04/14 120/70  10/30/14 107/67       Objective:   Physical Exam  Constitutional: She appears well-developed. No distress.  HENT:  Head:  Normocephalic.  Right Ear: External ear normal.  Left Ear: External ear normal.  Nose: Nose normal.  Mouth/Throat: Oropharynx is clear and moist.  Eyes: Conjunctivae are normal. Pupils are equal, round, and reactive to light. Right eye exhibits no discharge. Left eye exhibits no discharge.  Neck: Normal range of motion. Neck supple. No JVD present. No tracheal deviation present. No thyromegaly present.  Cardiovascular: Normal rate, regular rhythm and normal heart sounds.   Pulmonary/Chest: No stridor. No respiratory distress. She has no wheezes.  Abdominal: Soft. Bowel sounds are normal. She exhibits mass. She exhibits no distension. There is no tenderness. There is no rebound and no guarding.  Musculoskeletal: She exhibits no edema or tenderness.  Lymphadenopathy:    She has no cervical adenopathy.  Neurological: She displays normal reflexes. A cranial nerve deficit is present. She exhibits normal muscle tone. Coordination normal.  R facial droop  weakness R side  Skin: No rash noted. No erythema.  Psychiatric: She has a normal mood and affect. Her behavior is normal.  Umbil hernia - NT Using a cane Dry skin   Lab Results  Component Value Date                                                                                                             Assessment & Plan:

## 2014-11-27 NOTE — Assessment & Plan Note (Signed)
Chronic On Metformin 

## 2014-11-27 NOTE — Assessment & Plan Note (Signed)
2016 R hemiparesis On Plavix, Pravachol

## 2014-11-27 NOTE — Progress Notes (Signed)
Pre visit review using our clinic review tool, if applicable. No additional management support is needed unless otherwise documented below in the visit note. 

## 2014-11-27 NOTE — Assessment & Plan Note (Addendum)
Stable

## 2014-11-27 NOTE — Assessment & Plan Note (Signed)
Resolved

## 2014-11-27 NOTE — Assessment & Plan Note (Signed)
Chronic On Spironolactone 

## 2014-11-27 NOTE — Assessment & Plan Note (Signed)
Chronic D/c Paxil - side effects Switch to Lexapro - 3/16

## 2014-12-01 ENCOUNTER — Telehealth: Payer: Self-pay | Admitting: Internal Medicine

## 2014-12-01 NOTE — Telephone Encounter (Signed)
Lillia AbedLindsay follow up on order for PT faxed 11/27/2014. Please fax to [phone] attn lindsay r

## 2014-12-24 ENCOUNTER — Encounter: Payer: Self-pay | Admitting: Internal Medicine

## 2014-12-24 ENCOUNTER — Ambulatory Visit (INDEPENDENT_AMBULATORY_CARE_PROVIDER_SITE_OTHER): Payer: Commercial Managed Care - HMO | Admitting: Internal Medicine

## 2014-12-24 ENCOUNTER — Other Ambulatory Visit (INDEPENDENT_AMBULATORY_CARE_PROVIDER_SITE_OTHER): Payer: Commercial Managed Care - HMO

## 2014-12-24 VITALS — BP 116/74 | HR 84 | Temp 97.7°F | Ht 65.0 in | Wt 150.2 lb

## 2014-12-24 DIAGNOSIS — D509 Iron deficiency anemia, unspecified: Secondary | ICD-10-CM | POA: Diagnosis not present

## 2014-12-24 DIAGNOSIS — R739 Hyperglycemia, unspecified: Secondary | ICD-10-CM | POA: Diagnosis not present

## 2014-12-24 DIAGNOSIS — R002 Palpitations: Secondary | ICD-10-CM

## 2014-12-24 DIAGNOSIS — Z8673 Personal history of transient ischemic attack (TIA), and cerebral infarction without residual deficits: Secondary | ICD-10-CM

## 2014-12-24 LAB — BASIC METABOLIC PANEL
BUN: 21 mg/dL (ref 6–23)
CO2: 29 mEq/L (ref 19–32)
CREATININE: 0.84 mg/dL (ref 0.40–1.20)
Calcium: 9.8 mg/dL (ref 8.4–10.5)
Chloride: 105 mEq/L (ref 96–112)
GFR: 85.37 mL/min (ref >60.00)
GLUCOSE: 87 mg/dL (ref 70–99)
Potassium: 4.1 mEq/L (ref 3.5–5.1)
Sodium: 141 mEq/L (ref 135–145)

## 2014-12-24 LAB — CBC WITH DIFFERENTIAL/PLATELET
BASOS PCT: 2.2 % (ref 0.0–3.0)
Basophils Absolute: 0.1 10*3/uL (ref 0.0–0.1)
EOS PCT: 3.2 % (ref 0.0–5.0)
Eosinophils Absolute: 0.2 10*3/uL (ref 0.0–0.7)
HCT: 37 % (ref 36.0–46.0)
Hemoglobin: 12.4 g/dL (ref 12.0–15.0)
Lymphocytes Relative: 45.3 % (ref 12.0–46.0)
Lymphs Abs: 2.2 10*3/uL (ref 0.7–4.0)
MCHC: 33.7 g/dL (ref 30.0–36.0)
MCV: 90.7 fl (ref 78.0–100.0)
MONO ABS: 0.2 10*3/uL (ref 0.1–1.0)
Monocytes Relative: 3.8 % (ref 3.0–12.0)
Neutro Abs: 2.2 10*3/uL (ref 1.4–7.7)
Neutrophils Relative %: 45.5 % (ref 43.0–77.0)
PLATELETS: 348 10*3/uL (ref 150.0–400.0)
RBC: 4.08 Mil/uL (ref 3.87–5.11)
RDW: 17.6 % — ABNORMAL HIGH (ref 11.5–15.5)
WBC: 4.8 10*3/uL (ref 4.0–10.5)

## 2014-12-24 NOTE — Progress Notes (Signed)
Pre visit review using our clinic review tool, if applicable. No additional management support is needed unless otherwise documented below in the visit note. 

## 2014-12-24 NOTE — Patient Instructions (Signed)
  Your next office appointment will be determined based upon review of your pending labs. Those instructions will be transmitted to you by mail for your records.  Critical results will be called.   Followup as needed for any active or acute issue. Please report any significant change in your symptoms.  To prevent palpitations or premature beats, avoid stimulants such as decongestants, diet pills, nicotine, or caffeine (coffee, tea, cola, or chocolate) to excess.   Aspirin or anti-inflammatory agents such as ibuprofen, naproxen,diclofenac ,etc cannot be taken if you are on Plavix or clopidogrel, a blood thinner. This could lead to serious gastric or  intestinal bleeding. Tylenol at a dose which does not exceed the packaging recommendations would be safe.
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
## 2014-12-28 ENCOUNTER — Telehealth: Payer: Self-pay | Admitting: Internal Medicine

## 2014-12-28 MED ORDER — CYANOCOBALAMIN 1000 MCG PO TABS: 1000.0000 ug | ORAL_TABLET | Freq: Every day | ORAL | Status: DC

## 2014-12-28 NOTE — Telephone Encounter (Signed)
Rf sent. Pt's daughter informed.

## 2014-12-28 NOTE — Telephone Encounter (Signed)
reqeusting vitamin B-12 1000 MCG tablet [[id number]][[id number]]  Refill be sent to [facility] on [address]

## 2015-01-12 ENCOUNTER — Encounter: Payer: Self-pay | Admitting: Neurology

## 2015-01-12 ENCOUNTER — Ambulatory Visit (INDEPENDENT_AMBULATORY_CARE_PROVIDER_SITE_OTHER): Payer: Commercial Managed Care - HMO | Admitting: Neurology

## 2015-01-12 VITALS — BP 134/80 | HR 76 | Ht 63.5 in | Wt 152.2 lb

## 2015-01-12 DIAGNOSIS — G3184 Mild cognitive impairment, so stated: Secondary | ICD-10-CM | POA: Diagnosis not present

## 2015-01-12 DIAGNOSIS — E785 Hyperlipidemia, unspecified: Secondary | ICD-10-CM | POA: Diagnosis not present

## 2015-01-12 DIAGNOSIS — I639 Cerebral infarction, unspecified: Secondary | ICD-10-CM | POA: Diagnosis not present

## 2015-01-12 DIAGNOSIS — I10 Essential (primary) hypertension: Secondary | ICD-10-CM

## 2015-01-12 DIAGNOSIS — E1159 Type 2 diabetes mellitus with other circulatory complications: Secondary | ICD-10-CM

## 2015-01-12 NOTE — Progress Notes (Signed)
STROKE NEUROLOGY FOLLOW UP NOTE  NAME: Tracy [name] DOB: 07/19/1941  REASON FOR VISIT: stroke follow up HISTORY FROM: son and chart  Today we had the pleasure of seeing Tracy [name] in follow-up at our Neurology Clinic. Pt was accompanied by son.   History Summary Tracy [name] is a 74 year old female with history of the HTN, DM, left basal ganglia stroke in 2009 with mild right-sided deficit admitted on 10/26/14 for fall and confusion. MRI showed small midbrain infarct, consistent with small vessel disease. Patient not compliant with plavix at home. LDL 68 and A1c 5.9. CTA head and neck and 2-D echo unremarkable. Patient will continue on Plavix, added pravastatin for stroke prevention. Symptoms resolved on discharge.  Interval History During the interval time, the patient has been doing well. No recurrent stroke like symptoms. Family complains of memory loss and want to continue observation instead of medication at this time. Pt and son stated that her BP and glucose controlled at home well. Today BP 134/80 in clinic.  REVIEW OF SYSTEMS: Full 14 system review of systems performed and notable only for those listed below and in HPI above, all others are negative:  Constitutional:   Cardiovascular:  Ear/Nose/Throat:  Ringing in ears Skin:  Eyes:  Eye itching Respiratory:   Gastroitestinal:   Genitourinary:  Hematology/Lymphatic:   Endocrine:  Musculoskeletal:   Allergy/Immunology:   Neurological:  Walk difficulty, memory loss Psychiatric: confusion, depression, nervous/anxious Sleep: restless leg  The following represents the patient's updated allergies and side effects list: Allergies  Allergen Reactions  . Wellbutrin [Bupropion] Other (See Comments)    Wt loss    The neurologically relevant items on the patient's problem list were reviewed on today's visit.  Neurologic Examination  A problem focused neurological exam (12 or more points of the single system  neurologic examination, vital signs counts as 1 point, cranial nerves count for 8 points) was performed.  Blood pressure 134/80, pulse 76, height 5' 3.5" (1.613 m), weight 152 lb 3.2 oz (69.037 kg).  General - Well nourished, well developed, in no apparent distress.  Ophthalmologic - fundi not visualized due to eye movement.  Cardiovascular - Regular rate and rhythm with no murmur.  Mental Status -  Level of arousal and orientation to place, current president and person were intact, but not to time. Language including expression, naming, repetition, comprehension was assessed and found intact. Recent and remote memory were 3/3 registration and 0/3 delayed recall. Fund of Knowledge was assessed and was impaired with previous presidents.  Cranial Nerves II - XII - II - Visual field intact OU. 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 V - Facial sensation intact bilaterally. VII - Facial movement intact bilaterally, but mild right nasolabial fold flattening. VIII - Hearing & vestibular intact bilaterally. X - Palate elevates symmetrically. XI - Chin turning & shoulder shrug intact bilaterally. XII - Tongue protrusion intact.  Motor Strength - The patient's strength was normal in all extremities and pronator drift was absent.  Bulk was normal and fasciculations were absent.   Motor Tone - Muscle tone was assessed at the neck and appendages and was normal.  Reflexes - The patient's reflexes were 1+ in all extremities and she had no pathological reflexes.  Sensory - Light touch, temperature/pinprick were assessed and were normal.    Coordination - The patient had normal movements in the hands with no ataxia or dysmetria.  Tremor was absent.  Gait and Station - cautious gait, slow, with small stride.  Data reviewed: I personally reviewed the images and agree with the radiology interpretations.  Dg Chest 2 View  10/26/2014 IMPRESSION: No  acute disease.   Ct Head Wo Contrast  10/26/2014 IMPRESSION: 1. No acute intracranial abnormalities. 2. Mild chronic microvascular ischemic changes in the cerebral white matter and multiple old bilateral basal ganglia lacunar infarctions redemonstrated, as above.   Mr Brain Wo Contrast  10/26/2014 IMPRESSION: Possible 3 mm acute infarct in the central midbrain, not confirmed on coronal diffusion. Atrophy and chronic microvascular ischemia.   CTA head and neck: No extracranial or intracranial flow reducing lesion. No evidence for dissection acute intracranial abnormality.  2D echo - - Left ventricle: The cavity size was normal. Wall thickness was increased in a pattern of moderate LVH. Systolic function was normal. The estimated ejection fraction was in the range of 60% to 65%. Wall motion was normal; there were no regional wall motion abnormalities. Doppler parameters are consistent with abnormal left ventricular relaxation (grade 1 diastolic dysfunction).  Component     Latest Ref Rng 10/27/2014 10/28/2014 11/27/2014  Cholesterol     0 - 200 mg/dL 045    Triglycerides     <150 mg/dL 82    HDL Cholesterol     >39 mg/dL 45    Total CHOL/HDL Ratio      2.9    VLDL     0 - 40 mg/dL 16    LDL (calc)     0 - 99 mg/dL 68    Vitamin W-09     211 - 911 pg/mL  355   TSH     0.350 - 4.500 uIU/mL  4.305   Hemoglobin A1C     4.6 - 6.5 %   5.6    Assessment: As you may recall, she is a 74 y.o. African American female with PMH of HTN, DM, left BG stroke in 2009 with mild right sided deficit was admitted on 10/26/14 for small midbrain infarct. Patient not compliant with plavix at home. LDL 68 and A1c 5.9. CTA head and neck and 2-D echo unremarkable. Patient will continue on Plavix, added pravastatin for stroke prevention. Symptoms resolved on discharge. Pt doing well currently at home, compliant with medication. She continues to have memory difficulties, but would like to  continue observation instead of medication treatment.  Plan:  - continue plavix and pravastatin for stroke prevention - check BP and glucose at home - Follow up with your primary care physician for stroke risk factor modification. Recommend maintain blood pressure goal <130/80, diabetes with hemoglobin A1c goal below 6.5% and lipids with LDL cholesterol goal below 70 mg/dL.  - RTC PRN.  No orders of the defined types were placed in this encounter.    Meds ordered this encounter  Medications  . PARoxetine (PAXIL) 10 MG tablet    Sig:     Patient Instructions  - continue plavix and pravastatin for stroke prevention - check BP and glucose at home - Follow up with your primary care physician for stroke risk factor modification. Recommend maintain blood pressure goal <130/80, diabetes with hemoglobin A1c goal below 6.5% and lipids with LDL cholesterol goal below 70 mg/dL.  - follow up as needed.    Marvel Plan, MD PhD [facility] [address] [place], [place] [address] [phone]

## 2015-01-12 NOTE — Patient Instructions (Signed)
-   continue plavix and pravastatin for stroke prevention - check BP and glucose at home - Follow up with your primary care physician for stroke risk factor modification. Recommend maintain blood pressure goal <130/80, diabetes with hemoglobin A1c goal below 6.5% and lipids with LDL cholesterol goal below 70 mg/dL.  - follow up as needed.

## 2015-01-15 ENCOUNTER — Encounter (HOSPITAL_COMMUNITY): Payer: Self-pay | Admitting: Emergency Medicine

## 2015-01-15 ENCOUNTER — Emergency Department (HOSPITAL_COMMUNITY): Payer: Commercial Managed Care - HMO

## 2015-01-15 ENCOUNTER — Emergency Department (HOSPITAL_COMMUNITY)
Admission: EM | Admit: 2015-01-15 | Discharge: 2015-01-15 | Disposition: A | Discharge status: Home / Self Care | Payer: Commercial Managed Care - HMO | Attending: Emergency Medicine | Admitting: Emergency Medicine

## 2015-01-15 DIAGNOSIS — Z8673 Personal history of transient ischemic attack (TIA), and cerebral infarction without residual deficits: Secondary | ICD-10-CM | POA: Insufficient documentation

## 2015-01-15 DIAGNOSIS — Z8719 Personal history of other diseases of the digestive system: Secondary | ICD-10-CM | POA: Insufficient documentation

## 2015-01-15 DIAGNOSIS — F329 Major depressive disorder, single episode, unspecified: Secondary | ICD-10-CM | POA: Insufficient documentation

## 2015-01-15 DIAGNOSIS — F419 Anxiety disorder, unspecified: Secondary | ICD-10-CM | POA: Insufficient documentation

## 2015-01-15 DIAGNOSIS — R002 Palpitations: Secondary | ICD-10-CM | POA: Diagnosis not present

## 2015-01-15 DIAGNOSIS — Z7902 Long term (current) use of antithrombotics/antiplatelets: Secondary | ICD-10-CM | POA: Diagnosis not present

## 2015-01-15 DIAGNOSIS — E119 Type 2 diabetes mellitus without complications: Secondary | ICD-10-CM | POA: Insufficient documentation

## 2015-01-15 DIAGNOSIS — I10 Essential (primary) hypertension: Secondary | ICD-10-CM | POA: Diagnosis not present

## 2015-01-15 DIAGNOSIS — R0789 Other chest pain: Secondary | ICD-10-CM | POA: Diagnosis not present

## 2015-01-15 DIAGNOSIS — Z79899 Other long term (current) drug therapy: Secondary | ICD-10-CM | POA: Insufficient documentation

## 2015-01-15 DIAGNOSIS — Z8669 Personal history of other diseases of the nervous system and sense organs: Secondary | ICD-10-CM | POA: Diagnosis not present

## 2015-01-15 LAB — BASIC METABOLIC PANEL
Anion gap: 13 (ref 5–15)
BUN: 14 mg/dL (ref 6–20)
CO2: 23 mmol/L (ref 22–32)
CREATININE: 0.82 mg/dL (ref 0.44–1.00)
Calcium: 9.1 mg/dL (ref 8.9–10.3)
Chloride: 107 mmol/L (ref 101–111)
GFR calc Af Amer: >60 mL/min (ref >60)
GLUCOSE: 80 mg/dL (ref 70–99)
Potassium: 4 mmol/L (ref 3.5–5.1)
Sodium: 143 mmol/L (ref 135–145)

## 2015-01-15 LAB — CBC
HCT: 34.7 % — ABNORMAL LOW (ref 36.0–46.0)
Hemoglobin: 11.7 g/dL — ABNORMAL LOW (ref 12.0–15.0)
MCH: 31 pg (ref 26.0–34.0)
MCHC: 33.7 g/dL (ref 30.0–36.0)
MCV: 92 fL (ref 78.0–100.0)
PLATELETS: 368 10*3/uL (ref 150–400)
RBC: 3.77 MIL/uL — AB (ref 3.87–5.11)
RDW: 16.1 % — ABNORMAL HIGH (ref 11.5–15.5)
WBC: 5.5 10*3/uL (ref 4.0–10.5)

## 2015-01-15 LAB — I-STAT TROPONIN, ED: TROPONIN I, POC: 0 ng/mL (ref 0.00–0.08)

## 2015-01-15 NOTE — Discharge Instructions (Signed)
Holter Monitoring A Holter monitor is a small device with electrodes (small sticky patches) that attach to your chest. It records the electrical activity of your heart and is worn continuously for 24-48 hours.  A HOLTER MONITOR IS USED TO  Detect heart problems such as:  Heart arrhythmia. Is an abnormal or irregular heartbeat. With some heart arrhythmias, you may not feel or know that you have an irregular heart rhythm.  Palpitations, such as feeling your heart racing or fluttering. It is possible to have heart palpitations and not have a heart arrhythmia.  A heart rhythm that is too slow or too fast.  If you have problems fainting, near fainting or feeling light-headed, a Holter monitor may be worn to see if your heart is the cause. HOLTER MONITOR PREPARATION   Electrodes will be attached to the skin on your chest.  If you have hair on your chest, small areas may have to be shaved. This is done to help the patches stick better and make the recording more accurate.  The electrodes are attached by wires to the Holter monitor. The Holter monitor clips to your clothing. You will wear the monitor at all times, even while exercising and sleeping. HOME CARE INSTRUCTIONS   Wear your monitor at all times.  The wires and the monitor must stay dry. Do not get the monitor wet.  Do not bathe, swim or use a hot tub with it on.  You may do a "sponge" bath while you have the monitor on.  Keep your skin clean, do not put body lotion or moisturizer on your chest.  It's possible that your skin under the electrodes could become irritated. To keep this from happening, you may put the electrodes in slightly different places on your chest.  Your caregiver will also ask you to keep a diary of your activities, such as walking or doing chores. Be sure to note what you are doing if you experience heart symptoms such as palpitations. This will help your caregiver determine what might be contributing to your  symptoms. The information stored in your monitor will be reviewed by your caregiver alongside your diary entries.  Make sure the monitor is safely clipped to your clothing or in a location close to your body that your caregiver recommends.  The monitor and electrodes are removed when the test is over. Return the monitor as directed.  Be sure to follow up with your caregiver and discuss your Holter monitor results. SEEK IMMEDIATE MEDICAL CARE IF:  You faint or feel lightheaded.  You have trouble breathing.  You get pain in your chest, upper arm or jaw.  You feel sick to your stomach and your skin is pale, cool, or damp.  You think something is wrong with the way your heart is beating. MAKE SURE YOU:   Understand these instructions.  Will watch your condition.  Will get help right away if you are not doing well or get worse. Document Released: 05/26/2004 Document Revised: 11/20/2011 Document Reviewed: 10/08/2008 [facility] Patient Information 2015 [place], [place]. This information is not intended to replace advice given to you by your health care provider. Make sure you discuss any questions you have with your health care provider.  Palpitations A palpitation is the feeling that your heartbeat is irregular. It may feel like your heart is fluttering or skipping a beat. It may also feel like your heart is beating faster than normal. This is usually not a serious problem. In some cases, you may  need more medical tests. HOME CARE  Avoid:  Caffeine in coffee, tea, soft drinks, diet pills, and energy drinks.  Chocolate.  Alcohol.  Stop smoking if you smoke.  Reduce your stress and anxiety. Try:  A method that measures bodily functions so you can learn to control them (biofeedback).  Yoga.  Meditation.  Physical activity such as swimming, jogging, or walking.  Get plenty of rest and sleep. GET HELP IF:  Your fast or irregular heartbeat continues after 24 hours.  Your  palpitations occur more often. GET HELP RIGHT AWAY IF:   You have chest pain.  You feel short of breath.  You have a very bad headache.  You feel dizzy or pass out (faint). MAKE SURE YOU:   Understand these instructions.  Will watch your condition.  Will get help right away if you are not doing well or get worse. Document Released: 06/06/2008 Document Revised: 01/12/2014 Document Reviewed: 10/27/2011 [facility] Patient Information 2015 [place]. This information is not intended to replace advice given to you by your health care provider. Make sure you discuss any questions you have with your health care provider.   You were evaluated in the ED today and there does not appear to be any emergent cause for your symptoms at this time. Her chest x-ray and lab work and exam were all normal. It is important for you to follow-up with primary care as well as cardiology for further evaluation and management of your symptoms. Return to ED for new or worsening symptoms.

## 2015-01-15 NOTE — ED Notes (Signed)
2 attempts at IV unsuccessful; another RN at bedside to try.

## 2015-01-15 NOTE — ED Notes (Signed)
Pt c/o palpitations x 2 weeks but denies at present; pt denies pain but sts not feeling well

## 2015-01-15 NOTE — ED Notes (Signed)
Portable xray at bedside; family reports no abnormal behavior or speech issues. Pt has remained at baseline.

## 2015-01-15 NOTE — ED Provider Notes (Signed)
CSN: [id number]     Arrival date & time 01/15/15  0908 History   First MD Initiated Contact with Patient 01/15/15 0920     Chief Complaint  Patient presents with  . Palpitations     (Consider location/radiation/quality/duration/timing/severity/associated sxs/prior Treatment) HPI Tracy [name] is a 74 y.o. female with history of CVA and chronic small vessel disease on Plavix, diabetes, comes in for evaluation of palpitations. Patient reports this morning she woke up at 5:30 AM and began to experience a "funny feeling around my heart". She reports the sensation lasted approximately 2 or 3 minutes and then resolved on its own without any intervention. She has difficulty clarifying what she means. She denies chest pain, rapid heart rate, dizziness, syncope, nausea or vomiting, diaphoresis, shortness of breath, radiation of pain. She denies any discomfort now. No other aggravating or modifying factors. Patient is a nonsmoker  2-D echo 2/16 showed estimated EF of 60-65%  Past Medical History  Diagnosis Date  . Diabetes mellitus without complication   . Hypertension   . Stroke   . Acute cholecystitis 08/10/2013    Lap chole on 08/13/13   . Depression   . Anxiety   . Restless leg syndrome    Past Surgical History  Procedure Laterality Date  . Ercp N/A 08/12/2013    Procedure: ENDOSCOPIC RETROGRADE CHOLANGIOPANCREATOGRAPHY (ERCP);  Surgeon: Theda Belfast, MD;  Location: [facility] ENDOSCOPY;  Service: Endoscopy;  Laterality: N/A;  . Sphincterotomy  08/12/2013    Procedure: SPHINCTEROTOMY;  Surgeon: Theda Belfast, MD;  Location: [facility] ENDOSCOPY;  Service: Endoscopy;;  . Cholecystectomy N/A 08/13/2013    Procedure: LAPAROSCOPIC CHOLECYSTECTOMY ;  Surgeon: Currie Paris, MD;  Location: [facility] OR;  Service: General;  Laterality: N/A;  . Incisional hernia repair N/A 08/20/2013    Procedure: HERNIA REPAIR INCISIONAL UMBILICAL;  Surgeon: Velora Heckler, MD;  Location: [facility] OR;  Service: General;  Laterality:  N/A;   Family History  Problem Relation Age of Onset  . Stroke Mother    History  Substance Use Topics  . Smoking status: Never Smoker   . Smokeless tobacco: Not on file  . Alcohol Use: No   OB History    No data available     Review of Systems A 10 point review of systems was completed and was negative except for pertinent positives and negatives as mentioned in the history of present illness     Allergies  Wellbutrin  Home Medications   Prior to Admission medications   Medication Sig Start Date End Date Taking? Authorizing Provider  acetaminophen (TYLENOL) 325 MG tablet Take 650 mg by mouth every 6 (six) hours as needed for pain.   Yes Historical Provider, MD  clopidogrel (PLAVIX) 75 MG tablet Take 1 tablet (75 mg total) by mouth daily. 10/05/14  Yes Aleksei Plotnikov V, MD  cyanocobalamin 1000 MCG tablet Take 1 tablet (1,000 mcg total) by mouth daily. 12/28/14  Yes Aleksei Plotnikov V, MD  Emollient (CETAPHIL DAILY ADVANCE) LOTN Use bid 11/27/14  Yes Aleksei Plotnikov V, MD  escitalopram (LEXAPRO) 5 MG tablet Take 1 tablet (5 mg total) by mouth daily. 11/27/14  Yes Aleksei Plotnikov V, MD  metFORMIN (GLUCOPHAGE) 1000 MG tablet Take 500 mg by mouth 2 (two) times daily with a meal. Decrease to 1/2 twice a day with meals 12/24/14  Yes Pecola Lawless, MD  polyethylene glycol powder (GLYCOLAX/MIRALAX) powder Take 17 g by mouth 2 (two) times daily as needed. 02/03/14  Yes Aleksei  Plotnikov V, MD  pravastatin (PRAVACHOL) 10 MG tablet Take 1 tablet (10 mg total) by mouth daily at 6 PM. 10/30/14  Yes Kathlen ModyVijaya Akula, MD  spironolactone (ALDACTONE) 25 MG tablet Take 1 tablet (25 mg total) by mouth daily. 10/05/14  Yes Aleksei Plotnikov V, MD   BP 130/61 mmHg  Pulse 73  Temp(Src) 97.8 F (36.6 C) (Oral)  Resp 17  SpO2 100% Physical Exam  Constitutional: She is oriented to person, place, and time. She appears well-developed and well-nourished.  HENT:  Head: Normocephalic and atraumatic.   Mouth/Throat: Oropharynx is clear and moist.  Eyes: Conjunctivae are normal. Pupils are equal, round, and reactive to light. Right eye exhibits no discharge. Left eye exhibits no discharge. No scleral icterus.  Neck: Neck supple.  Cardiovascular: Normal rate, regular rhythm and normal heart sounds.   Pulmonary/Chest: Effort normal and breath sounds normal. No respiratory distress. She has no wheezes. She has no rales.  Abdominal: Soft. There is no tenderness.  Musculoskeletal: She exhibits no edema or tenderness.  Neurological: She is alert and oriented to person, place, and time.  Cranial Nerves II-XII grossly intact  Skin: Skin is warm and dry. No rash noted.  Psychiatric: She has a normal mood and affect.  Nursing note and vitals reviewed.   ED Course  Procedures (including critical care time) Labs Review Labs Reviewed  CBC - Abnormal; Notable for the following:    RBC 3.77 (*)    Hemoglobin 11.7 (*)    HCT 34.7 (*)    RDW 16.1 (*)    All other components within normal limits  BASIC METABOLIC PANEL  Rosezena SensorI-STAT TROPOININ, ED    Imaging Review Dg Chest Port 1 View  01/15/2015   CLINICAL DATA:  74 year old female with a history of palpitations and left chest discomfort.  EXAM: PORTABLE CHEST - 1 VIEW  COMPARISON:  10/28/2014  FINDINGS: Cardiomediastinal silhouette projects within normal limits in size and contour. No confluent airspace disease, pneumothorax, or pleural effusion.  No displaced fracture.  Unremarkable appearance of the upper abdomen.  IMPRESSION: No radiographic evidence of acute cardiopulmonary disease.  Signed,  Yvone NeuJaime S. Loreta AveWagner, DO  [facility]  [facility]   Electronically Signed   By: Gilmer MorJaime  Wagner D.O.   On: 01/15/2015 10:19     EKG Interpretation None      ED ECG REPORT   Date: 01/15/2015  Rate: 77  Rhythm: normal sinus rhythm  QRS Axis: normal  Intervals: normal  ST/T Wave abnormalities: normal  Conduction  Disutrbances:none  Narrative Interpretation:   Old EKG Reviewed: unchanged  I have personally reviewed the EKG tracing and agree with the computerized printout as noted.  Filed Vitals:   01/15/15 1030 01/15/15 1100 01/15/15 1114 01/15/15 1130  BP: 127/101 121/69 121/69 130/61  Pulse: 74 68 64 73  Temp:      TempSrc:      Resp:   17   SpO2: 98% 99% 100% 100%    MDM  Vitals stable - WNL -afebrile Pt resting comfortably in ED. PE--benign physical exam Labwork-labs are noncontributory. EKG reassuring. Troponin negative Imaging-chest x-ray shows no acute cardio pulmonary pathology.  DDX--patient presents with nonspecific "funny feeling" around her heart that resolved spontaneously after 2-3 minutes, was nonexertional and had no other associated symptoms. Normal 2-D echo on February 16. Denies any chest discomfort in the ED. Patient is stable to be discharged to follow-up with her primary care as well as outpatient cardiology, referral provided.  I discussed all relevant lab findings and imaging results with pt and they verbalized understanding. Discussed f/u with PCP within 48 hrs and return precautions, pt very amenable to plan. Prior to patient discharge, I discussed and reviewed this case with Dr. Rubin PayorPickering, also saw and evaluated the patient    Final diagnoses:  Palpitations        Joycie PeekBenjamin Damiyah Ditmars, PA-C 01/15/15 1221  Benjiman CoreNathan Pickering, MD 01/16/15 [phone]

## 2015-01-18 ENCOUNTER — Ambulatory Visit (INDEPENDENT_AMBULATORY_CARE_PROVIDER_SITE_OTHER): Payer: Commercial Managed Care - HMO | Admitting: Family

## 2015-01-18 ENCOUNTER — Encounter: Payer: Self-pay | Admitting: Family

## 2015-01-18 ENCOUNTER — Other Ambulatory Visit (INDEPENDENT_AMBULATORY_CARE_PROVIDER_SITE_OTHER): Payer: Commercial Managed Care - HMO

## 2015-01-18 VITALS — BP 112/70 | HR 89 | Temp 98.0°F | Resp 18 | Ht 63.5 in | Wt 149.8 lb

## 2015-01-18 DIAGNOSIS — R002 Palpitations: Secondary | ICD-10-CM | POA: Insufficient documentation

## 2015-01-18 LAB — CBC
HEMATOCRIT: 34.3 % — AB (ref 36.0–46.0)
Hemoglobin: 11.4 g/dL — ABNORMAL LOW (ref 12.0–15.0)
MCHC: 33.4 g/dL (ref 30.0–36.0)
MCV: 90.8 fl (ref 78.0–100.0)
Platelets: 417 10*3/uL — ABNORMAL HIGH (ref 150.0–400.0)
RBC: 3.78 Mil/uL — AB (ref 3.87–5.11)
RDW: 16.9 % — AB (ref 11.5–15.5)
WBC: 6.8 10*3/uL (ref 4.0–10.5)

## 2015-01-18 LAB — BASIC METABOLIC PANEL
BUN: 18 mg/dL (ref 6–23)
CHLORIDE: 108 meq/L (ref 96–112)
CO2: 29 mEq/L (ref 19–32)
Calcium: 9.6 mg/dL (ref 8.4–10.5)
Creatinine, Ser: 0.76 mg/dL (ref 0.40–1.20)
GFR: 95.8 mL/min (ref >60.00)
Glucose, Bld: 97 mg/dL (ref 70–99)
Potassium: 3.9 mEq/L (ref 3.5–5.1)
Sodium: 144 mEq/L (ref 135–145)

## 2015-01-18 NOTE — Patient Instructions (Signed)
Thank you for choosing [facility].  Summary/Instructions:  Please continue to take your medications as prescribed.  Please stop by the lab on the basement level of the building for your blood work. Your results will be released to MyChart (or called to you) after review, usually within 72 hours after test completion. If any changes need to be made, you will be notified at that same time.  If your symptoms worsen or fail to improve, please contact our office for further instruction, or in case of emergency go directly to the emergency room at the closest medical facility.   Palpitations A palpitation is the feeling that your heartbeat is irregular or is faster than normal. It may feel like your heart is fluttering or skipping a beat. Palpitations are usually not a serious problem. However, in some cases, you may need further medical evaluation. CAUSES  Palpitations can be caused by:  Smoking.  Caffeine or other stimulants, such as diet pills or energy drinks.  Alcohol.  Stress and anxiety.  Strenuous physical activity.  Fatigue.  Certain medicines.  Heart disease, especially if you have a history of irregular heart rhythms (arrhythmias), such as atrial fibrillation, atrial flutter, or supraventricular tachycardia.  An improperly working pacemaker or defibrillator. DIAGNOSIS  To find the cause of your palpitations, your health care provider will take your medical history and perform a physical exam. Your health care provider may also have you take a test called an ambulatory electrocardiogram (ECG). An ECG records your heartbeat patterns over a 24-hour period. You may also have other tests, such as:  Transthoracic echocardiogram (TTE). During echocardiography, sound waves are used to evaluate how blood flows through your heart.  Transesophageal echocardiogram (TEE).  Cardiac monitoring. This allows your health care provider to monitor your heart rate and rhythm in real  time.  Holter monitor. This is a portable device that records your heartbeat and can help diagnose heart arrhythmias. It allows your health care provider to track your heart activity for several days, if needed.  Stress tests by exercise or by giving medicine that makes the heart beat faster. TREATMENT  Treatment of palpitations depends on the cause of your symptoms and can vary greatly. Most cases of palpitations do not require any treatment other than time, relaxation, and monitoring your symptoms. Other causes, such as atrial fibrillation, atrial flutter, or supraventricular tachycardia, usually require further treatment. HOME CARE INSTRUCTIONS   Avoid:  Caffeinated coffee, tea, soft drinks, diet pills, and energy drinks.  Chocolate.  Alcohol.  Stop smoking if you smoke.  Reduce your stress and anxiety. Things that can help you relax include:  A method of controlling things in your body, such as your heartbeats, with your mind (biofeedback).  Yoga.  Meditation.  Physical activity such as swimming, jogging, or walking.  Get plenty of rest and sleep. SEEK MEDICAL CARE IF:   You continue to have a fast or irregular heartbeat beyond 24 hours.  Your palpitations occur more often. SEEK IMMEDIATE MEDICAL CARE IF:  You have chest pain or shortness of breath.  You have a severe headache.  You feel dizzy or you faint. MAKE SURE YOU:  Understand these instructions.  Will watch your condition.  Will get help right away if you are not doing well or get worse. Document Released: 08/25/2000 Document Revised: 09/02/2013 Document Reviewed: 10/27/2011 [facility] Patient Information 2015 [place], [place]. This information is not intended to replace advice given to you by your health care provider. Make sure you discuss  any questions you have with your health care provider.   

## 2015-01-18 NOTE — Progress Notes (Signed)
Pre visit review using our clinic review tool, if applicable. No additional management support is needed unless otherwise documented below in the visit note. 

## 2015-01-18 NOTE — Assessment & Plan Note (Signed)
Palpitations of undetermined cause, cannot rule out anxiety. Cardiac workup appears negative. Obtain basic metabolic panel and CBC. Continue taking medications as previously prescribed. Follow up if symptoms recur.

## 2015-01-18 NOTE — Progress Notes (Signed)
   Subjective:    Patient ID: Tracy PaganiniYvonne D Lampron, female    DOB: 01/17/1941, 74 y.o.   MRN: [id number]  Chief Complaint  Patient presents with  . Hospitalization Follow-up    she states that she is doing better since being out of the hospital    HPI:  Tracy [name] is a 74 y.o. female with a PMH of hypertension, stroke, diabetes, dementia, anxiety, and hyperlipidemia who presents today for an ER follow up.  Recently seen in emergency room for evaluation of palpitations. Indicates she had a sensation of her heart feeling funny for approximately 2-3 minutes with resolution without any intervention. EKG and cardiac workup were negative. All emergency room notes were reviewed in detail.  Denies any associated symptoms of heart palpitations or recurrent feelings since leaving the hospital. She also denies chest pain and shortness of breath. States that she thinks it may have been her nerves that caused the feelings because they are not always the best. She is not currently experiencing any pain.    Allergies  Allergen Reactions  . Wellbutrin [Bupropion] Other (See Comments)    Wt loss    Current Outpatient Prescriptions on File Prior to Visit  Medication Sig Dispense Refill  . acetaminophen (TYLENOL) 325 MG tablet Take 650 mg by mouth every 6 (six) hours as needed for pain.    Marland Kitchen. clopidogrel (PLAVIX) 75 MG tablet Take 1 tablet (75 mg total) by mouth daily. 30 tablet 11  . cyanocobalamin 1000 MCG tablet Take 1 tablet (1,000 mcg total) by mouth daily. 30 tablet 5  . Emollient (CETAPHIL DAILY ADVANCE) LOTN Use bid 473 g 2  . escitalopram (LEXAPRO) 5 MG tablet Take 1 tablet (5 mg total) by mouth daily. 30 tablet 5  . metFORMIN (GLUCOPHAGE) 1000 MG tablet Take 500 mg by mouth 2 (two) times daily with a meal. Decrease to 1/2 twice a day with meals 60 tablet 11  . polyethylene glycol powder (GLYCOLAX/MIRALAX) powder Take 17 g by mouth 2 (two) times daily as needed. 527 g 11  . pravastatin  (PRAVACHOL) 10 MG tablet Take 1 tablet (10 mg total) by mouth daily at 6 PM. 30 tablet 1  . spironolactone (ALDACTONE) 25 MG tablet Take 1 tablet (25 mg total) by mouth daily. 30 tablet 11   No current facility-administered medications on file prior to visit.    Review of Systems  Eyes:       Negative for changes for vision.   Respiratory: Negative for chest tightness and shortness of breath.   Cardiovascular: Negative for chest pain, palpitations and leg swelling.      Objective:    BP 112/70 mmHg  Pulse 89  Temp(Src) 98 F (36.7 C) (Oral)  Resp 18  Ht 5' 3.5" (1.613 m)  Wt 149 lb 12.8 oz (67.949 kg)  BMI 26.12 kg/m2  SpO2 97% Nursing note and vital signs reviewed.  Physical Exam  Constitutional: She is oriented to person, place, and time. She appears well-developed and well-nourished. No distress.  Cardiovascular: Normal rate, regular rhythm, normal heart sounds and intact distal pulses.   Pulmonary/Chest: Effort normal and breath sounds normal.  Neurological: She is alert and oriented to person, place, and time.  Skin: Skin is warm and dry.  Psychiatric: She has a normal mood and affect. Her behavior is normal. Judgment and thought content normal.       Assessment & Plan:

## 2015-01-19 ENCOUNTER — Ambulatory Visit (INDEPENDENT_AMBULATORY_CARE_PROVIDER_SITE_OTHER): Payer: Commercial Managed Care - HMO | Admitting: Cardiology

## 2015-01-19 ENCOUNTER — Encounter: Payer: Self-pay | Admitting: Cardiology

## 2015-01-19 VITALS — BP 120/60 | HR 77 | Ht 63.5 in | Wt 150.0 lb

## 2015-01-19 DIAGNOSIS — R002 Palpitations: Secondary | ICD-10-CM

## 2015-01-19 DIAGNOSIS — Z8673 Personal history of transient ischemic attack (TIA), and cerebral infarction without residual deficits: Secondary | ICD-10-CM

## 2015-01-19 DIAGNOSIS — I10 Essential (primary) hypertension: Secondary | ICD-10-CM

## 2015-01-19 NOTE — Progress Notes (Signed)
Patient ID: Tracy [name], female   DOB: 03/30/1941, 74 y.o.   MRN: [id number]      Cardiology Office Note  Date:  01/19/2015   ID:  Tracy [name], DOB 12/09/1940, MRN [id number]  PCP:  Sonda PrimesAlex Plotnikov, MD  Cardiologist:   Lars MassonNELSON, Jaely Silman H, MD   Chief complain: Post hospitalization visit. Palpitaions.   History of Present Illness: Tracy [name] is a 74 y.o. female who presents for evaluation of palpitations. HPI Tracy [name] is a 74 y.o. female with history of CVA x 2, 7 years ago, and chronic small vessel disease on Plavix, diabetes, comes in for evaluation of palpitations. She went to the ER on 01/15/2015 with palpitations that woke her up at 5:30 AM and began to experience a "funny feeling around my heart like fluttering". She reports the sensation lasted approximately 2 or 3 minutes and then resolved on its own without any intervention.  The patient states that she "dosn't get this palpitations too often, maybe twice a month". They never last more than few minutes and are not associated with other symptoms.  She has difficulty clarifying what she means. She denies chest pain, rapid heart rate, dizziness, syncope, nausea or vomiting, diaphoresis, shortness of breath, radiation of pain. She denies any discomfort now. No other aggravating or modifying factors. Patient is a nonsmoker, walks with a cane.  Past Medical History  Diagnosis Date  . Diabetes mellitus without complication   . Hypertension   . Stroke   . Acute cholecystitis 08/10/2013    Lap chole on 08/13/13   . Depression   . Anxiety   . Restless leg syndrome     Past Surgical History  Procedure Laterality Date  . Ercp N/A 08/12/2013    Procedure: ENDOSCOPIC RETROGRADE CHOLANGIOPANCREATOGRAPHY (ERCP);  Surgeon: Theda BelfastPatrick D Hung, MD;  [facility];  Service: Endoscopy;  Laterality: N/A;  . Sphincterotomy  08/12/2013    Procedure: SPHINCTEROTOMY;  Surgeon: Theda BelfastPatrick D Hung, MD;  [facility];   Service: Endoscopy;;  . Cholecystectomy N/A 08/13/2013    Procedure: LAPAROSCOPIC CHOLECYSTECTOMY ;  Surgeon: Currie Parishristian J Streck, MD;  [facility];  Service: General;  Laterality: N/A;  . Incisional hernia repair N/A 08/20/2013    Procedure: HERNIA REPAIR INCISIONAL UMBILICAL;  Surgeon: Velora Hecklerodd M Gerkin, MD;  [facility];  Service: General;  Laterality: N/A;     Current Outpatient Prescriptions  Medication Sig Dispense Refill  . acetaminophen (TYLENOL) 325 MG tablet Take 650 mg by mouth every 6 (six) hours as needed for pain.    Marland Kitchen. clopidogrel (PLAVIX) 75 MG tablet Take 1 tablet (75 mg total) by mouth daily. 30 tablet 11  . cyanocobalamin 1000 MCG tablet Take 1 tablet (1,000 mcg total) by mouth daily. 30 tablet 5  . Emollient (CETAPHIL DAILY ADVANCE) LOTN Use bid 473 g 2  . escitalopram (LEXAPRO) 5 MG tablet Take 1 tablet (5 mg total) by mouth daily. 30 tablet 5  . metFORMIN (GLUCOPHAGE) 1000 MG tablet Take 500 mg by mouth 2 (two) times daily with a meal. Decrease to 1/2 twice a day with meals 60 tablet 11  . polyethylene glycol powder (GLYCOLAX/MIRALAX) powder Take 17 g by mouth 2 (two) times daily as needed. 527 g 11  . pravastatin (PRAVACHOL) 10 MG tablet Take 1 tablet (10 mg total) by mouth daily at 6 PM. 30 tablet 1  . spironolactone (ALDACTONE) 25 MG tablet Take 1 tablet (25 mg total) by mouth daily. 30 tablet  11   No current facility-administered medications for this visit.    Allergies:   Wellbutrin    Social History:  The patient  reports that she has never smoked. She does not have any smokeless tobacco history on file. She reports that she does not drink alcohol or use illicit drugs.   Family History:  The patient's family history includes Stroke in her mother.    ROS:  Please see the history of present illness.   Otherwise, review of systems are positive for none.   All other systems are reviewed and negative.    PHYSICAL EXAM: VS:  BP 120/60 mmHg  Pulse 77  Ht 5'  3.5" (1.613 m)  Wt 150 lb (68.04 kg)  BMI 26.15 kg/m2  SpO2 99% , BMI Body mass index is 26.15 kg/(m^2). GEN: Well nourished, well developed, in no acute distress HEENT: normal Neck: no JVD, carotid bruits, or masses Cardiac: RRR; no murmurs, rubs, or gallops,no edema  Respiratory:  clear to auscultation bilaterally, normal work of breathing GI: soft, nontender, nondistended, + BS MS: no deformity or atrophy Skin: warm and dry, no rash Neuro:  Strength and sensation are intact Psych: euthymic mood, full affect   EKG:  EKG is not ordered today. The ekg ordered today demonstrates SR, normal ECG (from ER on 01/15/15).   Recent Labs: 10/26/2014: ALT 9 10/28/2014: TSH 4.305 01/18/2015: BUN 18; Creatinine 0.76; Hemoglobin 11.4*; Platelets 417.0*; Potassium 3.9; Sodium 144    Lipid Panel    Component Value Date/Time   CHOL 129 10/27/2014 0546   TRIG 82 10/27/2014 0546   HDL 45 10/27/2014 0546   CHOLHDL 2.9 10/27/2014 0546   VLDL 16 10/27/2014 0546   LDLCALC 68 10/27/2014 0546   Wt Readings from Last 3 Encounters:  01/19/15 150 lb (68.04 kg)  01/18/15 149 lb 12.8 oz (67.949 kg)  01/12/15 152 lb 3.2 oz (69.037 kg)    Other studies Reviewed: Additional studies/ records that were reviewed today include: ECG. Review of the above records demonstrates: SR, normal ECG.  TTE: 10/27/2014 Left ventricle: The cavity size was normal. Wall thickness was increased in a pattern of moderate LVH. Systolic function was normal. The estimated ejection fraction was in the range of 60% to 65%. Wall motion was normal; there were no regional wall motion abnormalities. Doppler parameters are consistent with abnormal left ventricular relaxation (grade 1 diastolic dysfunction). LA - normal size  ECG: SR, normal ECG     ASSESSMENT AND PLAN:  1.  Palpitations - the concern is possible atrial fibrillation considering h/o two CVAs in the past. We will start 2 week e-cardio monitor to try  to identify any possible arrhythmias. For now continue ASA and Plavix. If we don't find any arrhythmias, we will refer to EP for loop recorder.   2. HTN - controlled  3. HLP - on pravastatin    Current medicines are reviewed at length with the patient today.  The patient does not have concerns regarding medicines.  The following changes have been made:  no change  Labs/ tests ordered today include:   Orders Placed This Encounter  Procedures  . Cardiac event monitor     Disposition:   FU with Tobias AlexanderNELSON, Leily Capek H in 2 month  Signed, Lars MassonNELSON, Matteus Mcnelly H, MD  01/19/2015 2:56 PM    [facility] [address], [place], [place]  [address] Phone: [phone]; Fax: [phone]

## 2015-01-19 NOTE — Patient Instructions (Addendum)
Medication Instructions:   Your physician recommends that you continue on your current medications as directed. Please refer to the Current Medication list given to you today.    Testing/Procedures:  Your physician has recommended that you wear an event monitor. Event monitors are medical devices that record the heart's electrical activity. Doctors most often us these monitors to diagnose arrhythmias. Arrhythmias are problems with the speed or rhythm of the heartbeat. The monitor is a small, portable device. You can wear one while you do your normal daily activities. This is usually used to diagnose what is causing palpitations/syncope (passing out).  PER DR NELSON YOU ONLY HAVE TO WEAR THIS FOR 2 WEEKS ONLY   Follow-Up:  Your physician recommends that you schedule a follow-up appointment in: 3 MONTHS WITH DR Delton SeeNELSON

## 2015-01-21 ENCOUNTER — Telehealth: Payer: Self-pay | Admitting: Family

## 2015-01-21 NOTE — Telephone Encounter (Signed)
Please inform patient that her kidney function and electrolytes are within the normal limits. Her other labs are consistent with previous readings and no changes are needed at this time.

## 2015-01-21 NOTE — Telephone Encounter (Signed)
Pt aware of results 

## 2015-01-25 ENCOUNTER — Ambulatory Visit (INDEPENDENT_AMBULATORY_CARE_PROVIDER_SITE_OTHER): Payer: Commercial Managed Care - HMO

## 2015-01-25 ENCOUNTER — Encounter: Payer: Self-pay | Admitting: *Deleted

## 2015-01-25 DIAGNOSIS — R002 Palpitations: Secondary | ICD-10-CM | POA: Diagnosis not present

## 2015-01-25 DIAGNOSIS — Z8673 Personal history of transient ischemic attack (TIA), and cerebral infarction without residual deficits: Secondary | ICD-10-CM

## 2015-01-25 NOTE — Progress Notes (Signed)
Patient ID: Tracy [name], female   DOB: 01/23/1941, 74 y.o.   MRN: [id number] [employer] verite 14 day cardiac event monitor applied to patient.

## 2015-02-05 ENCOUNTER — Emergency Department (HOSPITAL_COMMUNITY): Payer: Commercial Managed Care - HMO

## 2015-02-05 ENCOUNTER — Encounter (HOSPITAL_COMMUNITY): Payer: Self-pay | Admitting: *Deleted

## 2015-02-05 ENCOUNTER — Inpatient Hospital Stay (HOSPITAL_COMMUNITY)
Admission: EM | Admit: 2015-02-05 | Discharge: 2015-02-07 | DRG: 087 | Disposition: A | Discharge status: Home / Self Care | Payer: Commercial Managed Care - HMO | Attending: Neurosurgery | Admitting: Neurosurgery

## 2015-02-05 DIAGNOSIS — G2581 Restless legs syndrome: Secondary | ICD-10-CM | POA: Diagnosis present

## 2015-02-05 DIAGNOSIS — Z8673 Personal history of transient ischemic attack (TIA), and cerebral infarction without residual deficits: Secondary | ICD-10-CM | POA: Diagnosis not present

## 2015-02-05 DIAGNOSIS — S065X0A Traumatic subdural hemorrhage without loss of consciousness, initial encounter: Principal | ICD-10-CM | POA: Diagnosis present

## 2015-02-05 DIAGNOSIS — F419 Anxiety disorder, unspecified: Secondary | ICD-10-CM | POA: Diagnosis not present

## 2015-02-05 DIAGNOSIS — S065XAA Traumatic subdural hemorrhage with loss of consciousness status unknown, initial encounter: Secondary | ICD-10-CM | POA: Diagnosis present

## 2015-02-05 DIAGNOSIS — E119 Type 2 diabetes mellitus without complications: Secondary | ICD-10-CM | POA: Diagnosis not present

## 2015-02-05 DIAGNOSIS — F329 Major depressive disorder, single episode, unspecified: Secondary | ICD-10-CM | POA: Diagnosis present

## 2015-02-05 DIAGNOSIS — Z7902 Long term (current) use of antithrombotics/antiplatelets: Secondary | ICD-10-CM | POA: Diagnosis not present

## 2015-02-05 DIAGNOSIS — W06XXXA Fall from bed, initial encounter: Secondary | ICD-10-CM | POA: Diagnosis present

## 2015-02-05 DIAGNOSIS — S065X9A Traumatic subdural hemorrhage with loss of consciousness of unspecified duration, initial encounter: Secondary | ICD-10-CM

## 2015-02-05 DIAGNOSIS — R410 Disorientation, unspecified: Secondary | ICD-10-CM | POA: Diagnosis not present

## 2015-02-05 DIAGNOSIS — I10 Essential (primary) hypertension: Secondary | ICD-10-CM | POA: Diagnosis not present

## 2015-02-05 DIAGNOSIS — I639 Cerebral infarction, unspecified: Secondary | ICD-10-CM | POA: Diagnosis not present

## 2015-02-05 DIAGNOSIS — S0090XA Unspecified superficial injury of unspecified part of head, initial encounter: Secondary | ICD-10-CM | POA: Diagnosis not present

## 2015-02-05 DIAGNOSIS — S299XXA Unspecified injury of thorax, initial encounter: Secondary | ICD-10-CM | POA: Diagnosis not present

## 2015-02-05 DIAGNOSIS — S298XXA Other specified injuries of thorax, initial encounter: Secondary | ICD-10-CM | POA: Diagnosis not present

## 2015-02-05 LAB — COMPREHENSIVE METABOLIC PANEL
ALK PHOS: 47 U/L (ref 38–126)
ALT: 10 U/L — ABNORMAL LOW (ref 14–54)
AST: 15 U/L (ref 15–41)
Albumin: 4 g/dL (ref 3.5–5.0)
Anion gap: 9 (ref 5–15)
BILIRUBIN TOTAL: 0.9 mg/dL (ref 0.3–1.2)
BUN: 14 mg/dL (ref 6–20)
CHLORIDE: 106 mmol/L (ref 101–111)
CO2: 27 mmol/L (ref 22–32)
Calcium: 9.5 mg/dL (ref 8.9–10.3)
Creatinine, Ser: 0.75 mg/dL (ref 0.44–1.00)
GFR calc Af Amer: >60 mL/min (ref >60)
GLUCOSE: 87 mg/dL (ref 65–99)
Potassium: 4.1 mmol/L (ref 3.5–5.1)
Sodium: 142 mmol/L (ref 135–145)
TOTAL PROTEIN: 7.1 g/dL (ref 6.5–8.1)

## 2015-02-05 LAB — CBC WITH DIFFERENTIAL/PLATELET
BASOS PCT: 0 % (ref 0–1)
Basophils Absolute: 0 10*3/uL (ref 0.0–0.1)
Basophils Absolute: 0 10*3/uL (ref 0.0–0.1)
Basophils Relative: 0 % (ref 0–1)
EOS PCT: 1 % (ref 0–5)
Eosinophils Absolute: 0.1 10*3/uL (ref 0.0–0.7)
Eosinophils Absolute: 0.1 10*3/uL (ref 0.0–0.7)
Eosinophils Relative: 1 % (ref 0–5)
HCT: 33.8 % — ABNORMAL LOW (ref 36.0–46.0)
HEMATOCRIT: 34.1 % — AB (ref 36.0–46.0)
Hemoglobin: 11.2 g/dL — ABNORMAL LOW (ref 12.0–15.0)
Hemoglobin: 11.4 g/dL — ABNORMAL LOW (ref 12.0–15.0)
Lymphocytes Relative: 32 % (ref 12–46)
Lymphocytes Relative: 33 % (ref 12–46)
Lymphs Abs: 2.4 10*3/uL (ref 0.7–4.0)
Lymphs Abs: 2.3 10*3/uL (ref 0.7–4.0)
MCH: 29.7 pg (ref 26.0–34.0)
MCH: 30.4 pg (ref 26.0–34.0)
MCHC: 33.1 g/dL (ref 30.0–36.0)
MCHC: 33.4 g/dL (ref 30.0–36.0)
MCV: 89.7 fL (ref 78.0–100.0)
MCV: 90.9 fL (ref 78.0–100.0)
Monocytes Absolute: 0.4 10*3/uL (ref 0.1–1.0)
Monocytes Absolute: 0.4 10*3/uL (ref 0.1–1.0)
Monocytes Relative: 6 % (ref 3–12)
Monocytes Relative: 5 % (ref 3–12)
Neutro Abs: 4.5 10*3/uL (ref 1.7–7.7)
Neutro Abs: 4.3 10*3/uL (ref 1.7–7.7)
Neutrophils Relative %: 61 % (ref 43–77)
Neutrophils Relative %: 61 % (ref 43–77)
Platelets: 342 10*3/uL (ref 150–400)
Platelets: 344 10*3/uL (ref 150–400)
RBC: 3.77 MIL/uL — ABNORMAL LOW (ref 3.87–5.11)
RBC: 3.75 MIL/uL — AB (ref 3.87–5.11)
RDW: 15.1 % (ref 11.5–15.5)
RDW: 15.3 % (ref 11.5–15.5)
WBC: 7.3 10*3/uL (ref 4.0–10.5)
WBC: 7 10*3/uL (ref 4.0–10.5)

## 2015-02-05 LAB — URINALYSIS, ROUTINE W REFLEX MICROSCOPIC
Bilirubin Urine: NEGATIVE
GLUCOSE, UA: NEGATIVE mg/dL
HGB URINE DIPSTICK: NEGATIVE
Ketones, ur: 15 mg/dL — AB
Nitrite: NEGATIVE
PROTEIN: NEGATIVE mg/dL
Specific Gravity, Urine: 1.013 (ref 1.005–1.030)
Urobilinogen, UA: 1 mg/dL (ref 0.0–1.0)
pH: 7.5 (ref 5.0–8.0)

## 2015-02-05 LAB — URINE MICROSCOPIC-ADD ON

## 2015-02-05 LAB — BASIC METABOLIC PANEL
Anion gap: 11 (ref 5–15)
BUN: 14 mg/dL (ref 6–20)
CO2: 24 mmol/L (ref 22–32)
Calcium: 9.4 mg/dL (ref 8.9–10.3)
Chloride: 107 mmol/L (ref 101–111)
Creatinine, Ser: 0.74 mg/dL (ref 0.44–1.00)
GFR calc Af Amer: >60 mL/min (ref >60)
GFR calc non Af Amer: >60 mL/min (ref >60)
Glucose, Bld: 95 mg/dL (ref 65–99)
Potassium: 4.3 mmol/L (ref 3.5–5.1)
Sodium: 142 mmol/L (ref 135–145)

## 2015-02-05 LAB — I-STAT TROPONIN, ED: Troponin i, poc: 0 ng/mL (ref 0.00–0.08)

## 2015-02-05 LAB — CBG MONITORING, ED
GLUCOSE-CAPILLARY: 117 mg/dL — AB (ref 65–99)
Glucose-Capillary: 68 mg/dL (ref 65–99)

## 2015-02-05 LAB — PROTIME-INR
INR: 1.12 (ref 0.00–1.49)
Prothrombin Time: 14.6 seconds (ref 11.6–15.2)

## 2015-02-05 LAB — GLUCOSE, CAPILLARY: Glucose-Capillary: 83 mg/dL (ref 65–99)

## 2015-02-05 LAB — APTT: aPTT: 31 seconds (ref 24–37)

## 2015-02-05 MED ORDER — SODIUM CHLORIDE 0.9 % IJ SOLN: 3.0000 mL | INTRAMUSCULAR | Status: DC | PRN

## 2015-02-05 MED ORDER — POLYETHYLENE GLYCOL 3350 17 GM/SCOOP PO POWD
17.0000 g | Freq: Two times a day (BID) | ORAL | Status: DC | PRN
  Filled 2015-02-05: qty 255

## 2015-02-05 MED ORDER — ONDANSETRON HCL 4 MG/2ML IJ SOLN: 4.0000 mg | Freq: Four times a day (QID) | INTRAMUSCULAR | Status: DC | PRN

## 2015-02-05 MED ORDER — VITAMIN B-12 1000 MCG PO TABS
1000.0000 ug | ORAL_TABLET | Freq: Every day | ORAL | Status: DC
  Administered 2015-02-06 – 2015-02-07 (×2): 1000 ug via ORAL
  Filled 2015-02-05 (×2): qty 1

## 2015-02-05 MED ORDER — ESCITALOPRAM OXALATE 5 MG PO TABS
5.0000 mg | ORAL_TABLET | Freq: Every day | ORAL | Status: DC
  Administered 2015-02-06 – 2015-02-07 (×2): 5 mg via ORAL
  Filled 2015-02-05 (×2): qty 1

## 2015-02-05 MED ORDER — ACETAMINOPHEN 650 MG RE SUPP
650.0000 mg | Freq: Four times a day (QID) | RECTAL | Status: DC | PRN
Start: 2015-02-05 — End: 2015-02-07

## 2015-02-05 MED ORDER — ONDANSETRON HCL 4 MG PO TABS: 4.0000 mg | ORAL_TABLET | Freq: Four times a day (QID) | ORAL | Status: DC | PRN

## 2015-02-05 MED ORDER — PRAVASTATIN SODIUM 10 MG PO TABS
10.0000 mg | ORAL_TABLET | Freq: Every day | ORAL | Status: DC
  Administered 2015-02-05 – 2015-02-06 (×2): 10 mg via ORAL
  Filled 2015-02-05 (×3): qty 1

## 2015-02-05 MED ORDER — METFORMIN HCL 500 MG PO TABS
500.0000 mg | ORAL_TABLET | Freq: Two times a day (BID) | ORAL | Status: DC
  Administered 2015-02-05 – 2015-02-07 (×4): 500 mg via ORAL
  Filled 2015-02-05 (×6): qty 1

## 2015-02-05 MED ORDER — SODIUM CHLORIDE 0.9 % IV SOLN: 250.0000 mL | INTRAVENOUS | Status: DC | PRN

## 2015-02-05 MED ORDER — SODIUM CHLORIDE 0.9 % IJ SOLN
3.0000 mL | Freq: Two times a day (BID) | INTRAMUSCULAR | Status: DC
  Administered 2015-02-05: 3 mL via INTRAVENOUS

## 2015-02-05 MED ORDER — POTASSIUM CHLORIDE IN NACL 20-0.9 MEQ/L-% IV SOLN
INTRAVENOUS | Status: DC
  Administered 2015-02-05: 22:00:00 via INTRAVENOUS
  Administered 2015-02-06: 999 mL via INTRAVENOUS
  Administered 2015-02-07: 02:00:00 via INTRAVENOUS
  Filled 2015-02-05 (×5): qty 1000

## 2015-02-05 MED ORDER — OXYCODONE HCL 5 MG PO TABS: 5.0000 mg | ORAL_TABLET | ORAL | Status: DC | PRN

## 2015-02-05 MED ORDER — SPIRONOLACTONE 25 MG PO TABS
25.0000 mg | ORAL_TABLET | Freq: Every day | ORAL | Status: DC
  Administered 2015-02-06 – 2015-02-07 (×2): 25 mg via ORAL
  Filled 2015-02-05 (×2): qty 1

## 2015-02-05 MED ORDER — ACETAMINOPHEN 325 MG PO TABS: 650.0000 mg | ORAL_TABLET | Freq: Four times a day (QID) | ORAL | Status: DC | PRN

## 2015-02-05 NOTE — ED Provider Notes (Signed)
CSN: [id number]     Arrival date & time 02/05/15  1206 History   First MD Initiated Contact with Patient 02/05/15 1505     Chief Complaint  Patient presents with  . Fall     (Consider location/radiation/quality/duration/timing/severity/associated sxs/prior Treatment) HPI  Pt is a 74yo female with hx NIDDM, HTN, CVA 3 year ago mild Right sided weakness, anxiety, depression, restless leg syndrome and mild cognitive impairment, presenting to ED with reports she fell out of bed this morning around 5:30AM per pt.  Daughter states pt lives at home with her husband who found that she fell out of bed. Pt does not know how she fell, states she was trying to get out of bed and hit her forehead on the ground, unknown LOC.  Pt is on Plavix.  Pt c/o mild aching left sided forehead pain. Pt also c/o dull aching centralized chest pain that is mild in severity, does not radiate.  Denies fever, chills, n/v/d. Denies cough or congestion. Denies SOB.    Past Medical History  Diagnosis Date  . Diabetes mellitus without complication   . Hypertension   . Stroke   . Acute cholecystitis 08/10/2013    Lap chole on 08/13/13   . Depression   . Anxiety   . Restless leg syndrome    Past Surgical History  Procedure Laterality Date  . Ercp N/A 08/12/2013    Procedure: ENDOSCOPIC RETROGRADE CHOLANGIOPANCREATOGRAPHY (ERCP);  Surgeon: Theda Belfast, MD;  Location: [facility] ENDOSCOPY;  Service: Endoscopy;  Laterality: N/A;  . Sphincterotomy  08/12/2013    Procedure: SPHINCTEROTOMY;  Surgeon: Theda Belfast, MD;  Location: [facility] ENDOSCOPY;  Service: Endoscopy;;  . Cholecystectomy N/A 08/13/2013    Procedure: LAPAROSCOPIC CHOLECYSTECTOMY ;  Surgeon: Currie Paris, MD;  Location: [facility] OR;  Service: General;  Laterality: N/A;  . Incisional hernia repair N/A 08/20/2013    Procedure: HERNIA REPAIR INCISIONAL UMBILICAL;  Surgeon: Velora Heckler, MD;  [facility];  Service: General;  Laterality: N/A;   Family History  Problem  Relation Age of Onset  . Stroke Mother    History  Substance Use Topics  . Smoking status: Never Smoker   . Smokeless tobacco: Not on file  . Alcohol Use: No   OB History    No data available     Review of Systems  Constitutional: Negative for fever, chills, diaphoresis, appetite change and fatigue.  Eyes: Negative for photophobia, pain and visual disturbance.  Respiratory: Negative for cough and shortness of breath.   Cardiovascular: Positive for chest pain and palpitations. Negative for leg swelling.  Gastrointestinal: Negative for nausea, vomiting, abdominal pain and diarrhea.  Genitourinary: Negative for dysuria, frequency and flank pain.  Musculoskeletal: Negative for myalgias and back pain.  Neurological: Positive for syncope, weakness ( generalized) and headaches. Negative for dizziness, seizures, facial asymmetry, speech difficulty and light-headedness.  All other systems reviewed and are negative.     Allergies  Wellbutrin  Home Medications   Prior to Admission medications   Medication Sig Start Date End Date Taking? Authorizing Provider  acetaminophen (TYLENOL) 325 MG tablet Take 650 mg by mouth every 6 (six) hours as needed for pain.    Historical Provider, MD  clopidogrel (PLAVIX) 75 MG tablet Take 1 tablet (75 mg total) by mouth daily. 10/05/14   Aleksei Plotnikov V, MD  cyanocobalamin 1000 MCG tablet Take 1 tablet (1,000 mcg total) by mouth daily. 12/28/14   Aleksei Plotnikov V, MD  Emollient (CETAPHIL DAILY ADVANCE)  LOTN Use bid 11/27/14   Aleksei Plotnikov V, MD  escitalopram (LEXAPRO) 5 MG tablet Take 1 tablet (5 mg total) by mouth daily. 11/27/14   Aleksei Plotnikov V, MD  metFORMIN (GLUCOPHAGE) 1000 MG tablet Take 500 mg by mouth 2 (two) times daily with a meal. Decrease to 1/2 twice a day with meals 12/24/14   Pecola Lawless, MD  polyethylene glycol powder (GLYCOLAX/MIRALAX) powder Take 17 g by mouth 2 (two) times daily as needed. 02/03/14   Aleksei Plotnikov  V, MD  pravastatin (PRAVACHOL) 10 MG tablet Take 1 tablet (10 mg total) by mouth daily at 6 PM. 10/30/14   Kathlen Mody, MD  spironolactone (ALDACTONE) 25 MG tablet Take 1 tablet (25 mg total) by mouth daily. 10/05/14   Aleksei Plotnikov V, MD   BP 128/76 mmHg  Pulse 87  Temp(Src) 97.7 F (36.5 C) (Oral)  Resp 14  SpO2 100% Physical Exam  Constitutional: She is oriented to person, place, and time. She appears well-developed and well-nourished. No distress.  HENT:  Head: Normocephalic and atraumatic.  No obvious head trauma. No ecchymosis, edema, or erythema. Mild tenderness to Left side of forehead w/o crepitus or deformity  Eyes: Conjunctivae are normal. Pupils are equal, round, and reactive to light. No scleral icterus.  Disconjugate gaze (chronic)   Neck: Normal range of motion. Neck supple.  No midline bone tenderness, no crepitus or step-offs.   Cardiovascular: Normal rate, regular rhythm and normal heart sounds.   Pulmonary/Chest: Effort normal and breath sounds normal. No respiratory distress. She has no wheezes. She has no rales. She exhibits no tenderness.  Abdominal: Soft. Bowel sounds are normal. She exhibits no distension and no mass. There is no tenderness. There is no rebound and no guarding.  Musculoskeletal: Normal range of motion.  Neurological: She is alert and oriented to person, place, and time. She has normal strength. No sensory deficit. Coordination normal. GCS eye subscore is 4. GCS verbal subscore is 5. GCS motor subscore is 6.  Pt alert to person, place and time. Moves upper and lower extremities symmetric. Slight decrease strength right compared to left. Pt did not ambulate due to reports of falling, unsafe to ambulate at this time.  Skin: Skin is warm and dry. She is not diaphoretic.  Nursing note and vitals reviewed.   ED Course  Procedures (including critical care time) Labs Review Labs Reviewed  CBC WITH DIFFERENTIAL/PLATELET - Abnormal; Notable for the  following:    RBC 3.77 (*)    Hemoglobin 11.2 (*)    HCT 33.8 (*)    All other components within normal limits  BASIC METABOLIC PANEL  URINALYSIS, ROUTINE W REFLEX MICROSCOPIC (NOT AT [facility])  COMPREHENSIVE METABOLIC PANEL  CBC WITH DIFFERENTIAL/PLATELET  APTT  PROTIME-INR  I-STAT TROPOININ, ED  CBG MONITORING, ED    Imaging Review Dg Chest 2 View  02/05/2015   CLINICAL DATA:  Fall out of bed this morning.  EXAM: CHEST  2 VIEW  COMPARISON:  01/15/2015  FINDINGS: Heart is upper limits normal in size. No confluent airspace opacities, effusions or edema. No acute bony abnormality. Degenerative changes in the shoulders and thoracic spine.  IMPRESSION: No active cardiopulmonary disease.   Electronically Signed   By: Charlett Nose M.D.   On: 02/05/2015 16:55   Ct Head Wo Contrast  02/05/2015   CLINICAL DATA:  Fall from bed.  Frontal headache  EXAM: CT HEAD WITHOUT CONTRAST  TECHNIQUE: Contiguous axial images were obtained from the base of  the skull through the vertex without intravenous contrast.  COMPARISON:  10/27/2014  FINDINGS: There is a large parafalcine subdural hematoma overlying the left cerebral hemisphere. This has a maximum thickness of 9 mm, image 22/series 2. There is associated mass effect upon the left frontal and parietal lobe. The hematoma extends posteriorly and over the left side of the tentorium, image 14/ series 2. There is a smaller hematoma which overlies the left frontal lobe, image 23/series 2. This measures 5 mm. There is mild patchy low attenuation within the subcortical and periventricular white matter compatible with chronic microvascular disease. Prominence of the sulci and ventricles noted consistent with brain atrophy. Chronic left basal ganglia lacunar infarct is noted. The paranasal sinuses and mastoid air cells are clear. The calvarium appears intact.  IMPRESSION: 1. Acute left parafalcine subdural hematoma which extends posteriorly an along the left tentorium. A  smaller sub dural overlies the left frontal lobe convexity measuring 5 mm. 2. Small vessel ischemic disease and mild brain atrophy. Critical Value/emergent results were called by telephone at the time of interpretation on 02/05/2015 at 4:15 pm to Dr. Junius FinnerERIN O'MALLEY , who verbally acknowledged these results.   Electronically Signed   By: Signa Kellaylor  Stroud M.D.   On: 02/05/2015 16:15     EKG Interpretation   Date/Time:  Friday Feb 05 2015 12:16:34 EDT Ventricular Rate:  90 PR Interval:  166 QRS Duration: 72 QT Interval:  350 QTC Calculation: 428 R Axis:   82 Text Interpretation:  Normal sinus rhythm Normal ECG Artifact Confirmed by  Lincoln Brighamees, Liz [phone]([id number]) on 02/05/2015 3:50:35 PM      MDM   Final diagnoses:  Subdural hematoma    Pt is a 73yo female brought to ED by family after reports of pt falling out of bed this morning around 5:30AM this morning per pt.   Pt also c/o mild centralized chest pain, has had a cardiac monitor on for 2 weeks due to reports of palpitations but no prior dx of dysrhythmia per daughter. EKG in ED: NSR Troponin: negative for elevation  CT head: significant for acute left parafalcine subdural hematoma which extends posteriorly and along the left tentorium.  A smaller subdural overlie the Left frontal lobe convexity measuring 5mm.  5:05 PM Neurosurgery to admit pt     Junius Finnerrin O'Malley, Cordelia Poche-C 02/05/15 1730  Tilden FossaElizabeth Rees, MD 02/05/15 [phone]

## 2015-02-05 NOTE — ED Notes (Addendum)
Pt states that fell out of the bed this morning. Pt unsure of why she fell. Pt denies LOC. Pt alert in triage. Family states that she has some confusion at baseline. Pt landed face forward when she fell. Pt reports tenderness in her forehead. No obvious injury noted. Pt denies dizziness or vision changes.

## 2015-02-05 NOTE — ED Notes (Signed)
Pt back to room, Dr. Mikal Planeabell at bedside.

## 2015-02-05 NOTE — ED Notes (Signed)
Dinner tray ordered, Carb modified diet

## 2015-02-05 NOTE — ED Notes (Signed)
Patient given orange juice for blood glucose of 68

## 2015-02-05 NOTE — H&P (Signed)
Tracy [name] is an 74 y.o. female.   Chief Complaint: Subdural hemorrhage(falcine) HPI: whom fell out of bed this am. The fall was not witnessed but was heard by a family member whom was at the home. Brought to the hospital a head CT showed an acute subdural hematoma on the left side along the falx and tentorium. Little to no brain shift. No skull fractures. Currently takes plavix due to cerebral infarct sustained in February 2016.   Past Medical History  Diagnosis Date  . Diabetes mellitus without complication   . Hypertension   . Stroke   . Acute cholecystitis 08/10/2013    Lap chole on 08/13/13   . Depression   . Anxiety   . Restless leg syndrome     Past Surgical History  Procedure Laterality Date  . Ercp N/A 08/12/2013    Procedure: ENDOSCOPIC RETROGRADE CHOLANGIOPANCREATOGRAPHY (ERCP);  Surgeon: Beryle Beams, MD;  Location: [facility] ENDOSCOPY;  Service: Endoscopy;  Laterality: N/A;  . Sphincterotomy  08/12/2013    Procedure: SPHINCTEROTOMY;  Surgeon: Beryle Beams, MD;  Location: [facility] ENDOSCOPY;  Service: Endoscopy;;  . Cholecystectomy N/A 08/13/2013    Procedure: LAPAROSCOPIC CHOLECYSTECTOMY ;  Surgeon: Haywood Lasso, MD;  Location: [place];  Service: General;  Laterality: N/A;  . Incisional hernia repair N/A 08/20/2013    Procedure: HERNIA REPAIR INCISIONAL UMBILICAL;  Surgeon: Earnstine Regal, MD;  Location: [facility] OR;  Service: General;  Laterality: N/A;    Family History  Problem Relation Age of Onset  . Stroke Mother    Social History:  reports that she has never smoked. She does not have any smokeless tobacco history on file. She reports that she does not drink alcohol or use illicit drugs.  Allergies:  Allergies  Allergen Reactions  . Wellbutrin [Bupropion] Other (See Comments)    Wt loss     (Not in a hospital admission)  Results for orders placed or performed during the hospital encounter of 02/05/15 (from the past 48 hour(s))  Basic metabolic panel     Status:  None   Collection Time: 02/05/15  3:46 PM  Result Value Ref Range   Sodium 142 135 - 145 mmol/L   Potassium 4.3 3.5 - 5.1 mmol/L   Chloride 107 101 - 111 mmol/L   CO2 24 22 - 32 mmol/L   Glucose, Bld 95 65 - 99 mg/dL   BUN 14 6 - 20 mg/dL   Creatinine, Ser 0.74 0.44 - 1.00 mg/dL   Calcium 9.4 8.9 - 10.3 mg/dL   GFR calc non Af Amer >60 >60 mL/min   GFR calc Af Amer >60 >60 mL/min    Comment: (NOTE) The eGFR has been calculated using the CKD EPI equation. This calculation has not been validated in all clinical situations. eGFR's persistently <60 mL/min signify possible Chronic Kidney Disease.    Anion gap 11 5 - 15  CBC with Differential     Status: Abnormal   Collection Time: 02/05/15  3:46 PM  Result Value Ref Range   WBC 7.3 4.0 - 10.5 K/uL   RBC 3.77 (L) 3.87 - 5.11 MIL/uL   Hemoglobin 11.2 (L) 12.0 - 15.0 g/dL   HCT 33.8 (L) 36.0 - 46.0 %   MCV 89.7 78.0 - 100.0 fL   MCH 29.7 26.0 - 34.0 pg   MCHC 33.1 30.0 - 36.0 g/dL   RDW 15.1 11.5 - 15.5 %   Platelets 342 150 - 400 K/uL   Neutrophils Relative %  61 43 - 77 %   Neutro Abs 4.5 1.7 - 7.7 K/uL   Lymphocytes Relative 32 12 - 46 %   Lymphs Abs 2.4 0.7 - 4.0 K/uL   Monocytes Relative 6 3 - 12 %   Monocytes Absolute 0.4 0.1 - 1.0 K/uL   Eosinophils Relative 1 0 - 5 %   Eosinophils Absolute 0.1 0.0 - 0.7 K/uL   Basophils Relative 0 0 - 1 %   Basophils Absolute 0.0 0.0 - 0.1 K/uL  Comprehensive metabolic panel     Status: Abnormal   Collection Time: 02/05/15  3:47 PM  Result Value Ref Range   Sodium 142 135 - 145 mmol/L   Potassium 4.1 3.5 - 5.1 mmol/L   Chloride 106 101 - 111 mmol/L   CO2 27 22 - 32 mmol/L   Glucose, Bld 87 65 - 99 mg/dL   BUN 14 6 - 20 mg/dL   Creatinine, Ser 0.75 0.44 - 1.00 mg/dL   Calcium 9.5 8.9 - 10.3 mg/dL   Total Protein 7.1 6.5 - 8.1 g/dL   Albumin 4.0 3.5 - 5.0 g/dL   AST 15 15 - 41 U/L   ALT 10 (L) 14 - 54 U/L   Alkaline Phosphatase 47 38 - 126 U/L   Total Bilirubin 0.9 0.3 - 1.2  mg/dL   GFR calc non Af Amer >60 >60 mL/min   GFR calc Af Amer >60 >60 mL/min    Comment: (NOTE) The eGFR has been calculated using the CKD EPI equation. This calculation has not been validated in all clinical situations. eGFR's persistently <60 mL/min signify possible Chronic Kidney Disease.    Anion gap 9 5 - 15  CBC WITH DIFFERENTIAL     Status: Abnormal   Collection Time: 02/05/15  3:47 PM  Result Value Ref Range   WBC 7.0 4.0 - 10.5 K/uL   RBC 3.75 (L) 3.87 - 5.11 MIL/uL   Hemoglobin 11.4 (L) 12.0 - 15.0 g/dL   HCT 34.1 (L) 36.0 - 46.0 %   MCV 90.9 78.0 - 100.0 fL   MCH 30.4 26.0 - 34.0 pg   MCHC 33.4 30.0 - 36.0 g/dL   RDW 15.3 11.5 - 15.5 %   Platelets 344 150 - 400 K/uL   Neutrophils Relative % 61 43 - 77 %   Neutro Abs 4.3 1.7 - 7.7 K/uL   Lymphocytes Relative 33 12 - 46 %   Lymphs Abs 2.3 0.7 - 4.0 K/uL   Monocytes Relative 5 3 - 12 %   Monocytes Absolute 0.4 0.1 - 1.0 K/uL   Eosinophils Relative 1 0 - 5 %   Eosinophils Absolute 0.1 0.0 - 0.7 K/uL   Basophils Relative 0 0 - 1 %   Basophils Absolute 0.0 0.0 - 0.1 K/uL  APTT     Status: None   Collection Time: 02/05/15  3:47 PM  Result Value Ref Range   aPTT 31 24 - 37 seconds  Protime-INR     Status: None   Collection Time: 02/05/15  3:47 PM  Result Value Ref Range   Prothrombin Time 14.6 11.6 - 15.2 seconds   INR 1.12 0.00 - 1.49  I-stat troponin, ED     Status: None   Collection Time: 02/05/15  3:51 PM  Result Value Ref Range   Troponin i, poc 0.00 0.00 - 0.08 ng/mL   Comment 3            Comment: Due to the release kinetics of  cTnI, a negative result within the first hours of the onset of symptoms does not rule out myocardial infarction with certainty. If myocardial infarction is still suspected, repeat the test at appropriate intervals.   CBG monitoring, ED     Status: None   Collection Time: 02/05/15  4:33 PM  Result Value Ref Range   Glucose-Capillary 68 65 - 99 mg/dL  Urinalysis, Routine w  reflex microscopic     Status: Abnormal   Collection Time: 02/05/15  6:40 PM  Result Value Ref Range   Color, Urine YELLOW YELLOW   APPearance CLOUDY (A) CLEAR   Specific Gravity, Urine 1.013 1.005 - 1.030   pH 7.5 5.0 - 8.0   Glucose, UA NEGATIVE NEGATIVE mg/dL   Hgb urine dipstick NEGATIVE NEGATIVE   Bilirubin Urine NEGATIVE NEGATIVE   Ketones, ur 15 (A) NEGATIVE mg/dL   Protein, ur NEGATIVE NEGATIVE mg/dL   Urobilinogen, UA 1.0 0.0 - 1.0 mg/dL   Nitrite NEGATIVE NEGATIVE   Leukocytes, UA MODERATE (A) NEGATIVE  Urine microscopic-add on     Status: Abnormal   Collection Time: 02/05/15  6:40 PM  Result Value Ref Range   Squamous Epithelial / LPF FEW (A) RARE   WBC, UA 3-6 <3 WBC/hpf   RBC / HPF 0-2 <3 RBC/hpf   Bacteria, UA FEW (A) RARE   Dg Chest 2 View  02/05/2015   CLINICAL DATA:  Fall out of bed this morning.  EXAM: CHEST  2 VIEW  COMPARISON:  01/15/2015  FINDINGS: Heart is upper limits normal in size. No confluent airspace opacities, effusions or edema. No acute bony abnormality. Degenerative changes in the shoulders and thoracic spine.  IMPRESSION: No active cardiopulmonary disease.   Electronically Signed   By: Rolm Baptise M.D.   On: 02/05/2015 16:55   Ct Head Wo Contrast  02/05/2015   CLINICAL DATA:  Fall from bed.  Frontal headache  EXAM: CT HEAD WITHOUT CONTRAST  TECHNIQUE: Contiguous axial images were obtained from the base of the skull through the vertex without intravenous contrast.  COMPARISON:  10/27/2014  FINDINGS: There is a large parafalcine subdural hematoma overlying the left cerebral hemisphere. This has a maximum thickness of 9 mm, image 22/series 2. There is associated mass effect upon the left frontal and parietal lobe. The hematoma extends posteriorly and over the left side of the tentorium, image 14/ series 2. There is a smaller hematoma which overlies the left frontal lobe, image 23/series 2. This measures 5 mm. There is mild patchy low attenuation within the  subcortical and periventricular white matter compatible with chronic microvascular disease. Prominence of the sulci and ventricles noted consistent with brain atrophy. Chronic left basal ganglia lacunar infarct is noted. The paranasal sinuses and mastoid air cells are clear. The calvarium appears intact.  IMPRESSION: 1. Acute left parafalcine subdural hematoma which extends posteriorly an along the left tentorium. A smaller sub dural overlies the left frontal lobe convexity measuring 5 mm. 2. Small vessel ischemic disease and mild brain atrophy. Critical Value/emergent results were called by telephone at the time of interpretation on 02/05/2015 at 4:15 pm to Dr. Noland Fordyce , who verbally acknowledged these results.   Electronically Signed   By: Kerby Moors M.D.   On: 02/05/2015 16:15    Review of Systems  Constitutional: Negative.   Eyes: Negative.   Respiratory: Negative.   Cardiovascular: Negative.   Gastrointestinal: Negative.   Genitourinary: Negative.   Skin: Negative.   Neurological: Positive for headaches.  Endo/Heme/Allergies:  Negative.   Psychiatric/Behavioral: Negative.     Blood pressure 112/70, pulse 90, temperature 98.2 F (36.8 C), temperature source Oral, resp. rate 19, SpO2 100 %. Physical Exam  Constitutional: She appears well-developed and well-nourished. No distress.  HENT:  Head: Normocephalic.  Right Ear: External ear normal.  Left Ear: External ear normal.  Mouth/Throat: No oropharyngeal exudate.  Poor dentition  Eyes: Conjunctivae and EOM are normal. Pupils are equal, round, and reactive to light.  Neck: Normal range of motion. Neck supple.  Cardiovascular: Normal rate, regular rhythm and normal heart sounds.   Respiratory: Effort normal and breath sounds normal.  GI: Soft.  Musculoskeletal: Normal range of motion.  Neurological: She is alert. No cranial nerve deficit. She exhibits normal muscle tone. Coordination normal.  Does not know the year, her  birthday, or the name of the family member at her bedside. Easily follows all commands, and is appropriate  Skin: Skin is warm and dry.  Psychiatric: She has a normal mood and affect. Judgment normal.     Assessment/Plan Admit for observation, stop plavix.  Doing well with some slight confusion.   Darden Flemister L 02/05/2015, 8:49 PM

## 2015-02-05 NOTE — ED Notes (Signed)
PA at bedside.

## 2015-02-05 NOTE — ED Notes (Signed)
Patient transported to X-ray 

## 2015-02-06 ENCOUNTER — Observation Stay (HOSPITAL_COMMUNITY): Payer: Commercial Managed Care - HMO

## 2015-02-06 DIAGNOSIS — S065X0A Traumatic subdural hemorrhage without loss of consciousness, initial encounter: Secondary | ICD-10-CM | POA: Diagnosis not present

## 2015-02-06 LAB — MRSA PCR SCREENING: MRSA by PCR: NEGATIVE

## 2015-02-06 LAB — GLUCOSE, CAPILLARY: GLUCOSE-CAPILLARY: 106 mg/dL — AB (ref 65–99)

## 2015-02-06 MED ORDER — HALOPERIDOL LACTATE 5 MG/ML IJ SOLN: 5.0000 mg | INTRAMUSCULAR | Status: DC | PRN

## 2015-02-06 MED ORDER — LORAZEPAM 2 MG/ML IJ SOLN
INTRAMUSCULAR | Status: AC
  Administered 2015-02-06: 2 mg
  Filled 2015-02-06: qty 1

## 2015-02-06 MED ORDER — HALOPERIDOL LACTATE 5 MG/ML IJ SOLN
INTRAMUSCULAR | Status: AC
  Administered 2015-02-06: 5 mg
  Filled 2015-02-06: qty 1

## 2015-02-06 MED ORDER — SODIUM CHLORIDE 0.9 % IV BOLUS (SEPSIS)
1000.0000 mL | Freq: Once | INTRAVENOUS | Status: AC
  Administered 2015-02-06: 1000 mL via INTRAVENOUS

## 2015-02-06 MED ORDER — DEXMEDETOMIDINE HCL IN NACL 200 MCG/50ML IV SOLN
0.4000 ug/kg/h | INTRAVENOUS | Status: DC
  Administered 2015-02-06: 0.4 ug/kg/h via INTRAVENOUS
  Filled 2015-02-06: qty 50

## 2015-02-06 MED ORDER — LORAZEPAM 2 MG/ML IJ SOLN: 2.0000 mg | Freq: Once | INTRAMUSCULAR | Status: AC

## 2015-02-06 NOTE — Progress Notes (Signed)
eLink Physician-Brief Progress Note Patient Name: Tracy PaganiniYvonne D Longie DOB: 05/14/1941 MRN: [id number]   Date of Service  02/06/2015  HPI/Events of Note  Severe agitn - kicking & flailing around on camera  eICU Interventions  Ativan 2 mg given, haldol 5 mg x 1 If remains agitated -precedex gtt     Intervention Category Major Interventions: Delirium, psychosis, severe agitation - evaluation and management  ALVA,RAKESH V. 02/06/2015, 6:21 AM

## 2015-02-06 NOTE — Progress Notes (Signed)
Patient ID: Tracy PaganiniYvonne D Wlodarczyk, female   DOB: 12/20/1940, 74 y.o.   MRN: [id number] BP 115/66 mmHg  Pulse 90  Temp(Src) 98.5 F (36.9 C) (Oral)  Resp 18  Wt 68 kg (149 lb 14.6 oz)  SpO2 100%' Lethargic, following all commands Moving all extremities Knows birthday, location, situation Family believes she is much closer to baseline.  Plan on discharge tomorrow.

## 2015-02-06 NOTE — Progress Notes (Addendum)
Pt in CT becoming anxious, combative, and more confused. Afraid staff is trying to harm her.  MD notified, 2 mg Ativan ordered and given.

## 2015-02-06 NOTE — Progress Notes (Signed)
BP dropped to 70s systolic. NP Tammy Parrett made aware. Precedex gtt stopped. 1 L bolus NS intitated.

## 2015-02-06 NOTE — Progress Notes (Addendum)
Pt increasingly confused and agitated. Attempting to climb out of bed and is aggressive towards family and staff. Pt is on Recruitment consultantsafety sitter list, but no sitter is available at this time. Dr. Franky Machoabbell paged at [phone]. Pt's family states her behavior is consistent with previous hospital visits and usually resides when she arrives at home.

## 2015-02-07 NOTE — Discharge Instructions (Addendum)
Because you have a subdural hematoma, you must no longer take your plavix. You need to contact your physician and make them aware of this. They can contact me at [phone]. [facility]. Subdural Hematoma A subdural hematoma is a collection of blood between the brain and its tough outermost membrane covering (the dura). Blood clots that form in this area push down on the brain and cause irritation. A subdural hematoma may cause parts of the brain to stop working and eventually cause death.  CAUSES A subdural hematoma is caused by bleeding from a ruptured blood vessel (hemorrhage). The bleeding results from trauma to the head, such as from a fall or motor vehicle accident. There are two types of subdural hemorrhages:  Acute. This type develops shortly after a serious blow to the head and causes blood to collect very quickly. If not diagnosed and treated promptly, severe brain injury or death can occur.  Chronic. This is when bleeding develops more slowly, over weeks or months. RISK FACTORS People at risk for subdural hematoma include older persons, infants, and alcoholics. SYMPTOMS An acute subdural hemorrhage develops over minutes to hours. Symptoms can include:  Temporary loss of consciousness.  Weakness of arms or legs on one side of the body.  Changes in vision or speech.  A severe headache.  Seizures.  Nausea and vomiting.  Increased sleepiness. A chronic subdural hemorrhage develops over weeks to months. Symptoms may develop slowly and produce less noticeable problems or changes. Symptoms include:  A mild headache.  A change in personality.  Loss of balance or difficulty walking.  Weakness, numbness, or tingling in the arms or legs.  Nausea or vomiting.  Memory loss.  Double vision.  Increased sleepiness. DIAGNOSIS Your health care provider will perform a thorough physical and neurological exam. A CT scan or MRI may also be done. If there is  blood on the scan, its color will help your health care provider determine how long the hemorrhage has been there. TREATMENT If the cause is an acute subdural hemorrhage, immediate treatment is needed. In many cases an emergency surgery is performed to drain accumulated blood or to remove the blood clot. Sometimes steroid or diuretic medicines or controlled breathing through a ventilator is needed to decrease pressure in the brain. This is especially true if there is any swelling of the brain. If the cause is a chronic subdural hemorrhage, treatment depends on a variety of factors. Sometimes no treatment is needed. If the subdural hematoma is small and causes minimal or no symptoms, you may be treated with bed rest, medicines, and observation. If the hemorrhage is large or if you have neurological symptoms, an emergency surgery is usually needed to remove the blood clot. People who develop a subdural hemorrhage are at risk of developing seizures, even after the subdural hematoma has been treated. You may be prescribed an anti-seizure (anticonvulsant) medicine for a year or longer. HOME CARE INSTRUCTIONS  Only take medicines as directed by your health care provider.  Rest if directed by your health care provider.  Keep all follow-up appointments with your health care provider.  If you play a contact sport such as football, hockey or soccer and you experienced a significant head injury, allow enough time for healing (up to 15 days) before you start playing again. A repeated injury that occurs during this fragile repair period is likely to result in hemorrhage. This is called the second impact syndrome. SEEK IMMEDIATE MEDICAL CARE IF:  You fall or  experience minor trauma to your head and you are taking blood thinners. If you are on any blood thinners even a very small injury can cause a subdural hematoma. You should not hesitate to seek medical attention regardless of how minor you think your symptoms  are.  You experience a head injury and have:  Drowsiness or a decrease in alertness.  Confusion or forgetfulness.  Slurred speech.  Irrational or aggressive behavior.  Numbness or paralysis in any part of the body.  A feeling of being sick to your stomach (nauseous) or you throw up (vomit).  Difficulty walking or poor coordination.  Double vision.  Seizures.  A bleeding disorder.  A history of heavy alcohol use.  Clear fluid draining from your nose or ears.  Personality changes.  Difficulty thinking.  Worsening symptoms. MAKE SURE YOU:  Understand these instructions.  Will watch your condition.  Will get help right away if you are not doing well or get worse.

## 2015-02-07 NOTE — Progress Notes (Signed)
Pt back from CT and severely agitated; Attempting to get out of bed, hitting at staff, kicking, yelling and screaming.  Patient thinks staff is trying to "catch her on fire." ELINK button initiated due to emergent situation involving pt and staff safety.  Pola CornELINK MD ordered 5 mg Haldol with no improvement.  There is no safety sitter available at this time.  Precedex gtt ordered. Oncoming RN, Annie ParasErin Zomora informed to turn Precedex gtt off as soon as safely possible (arrival of safety sitter or when pt is calm).  RN also informed to update neurosurgery of situation during morning rounds.

## 2015-02-07 NOTE — Progress Notes (Signed)
Noted order to dc patient home.Ambulated patient per order,patient needing two people support to ambulate but family states that she has a walker at home.DC instructions given to patient and family and they verbalized understanding.

## 2015-02-07 NOTE — Discharge Summary (Signed)
  Physician Discharge Summary  Patient ID: Tracy [name] MRN: [id number] DOB/AGE: 74/12/1940 74 y.o.  Admit date: 02/05/2015 Discharge date: 02/07/2015  Admission Diagnoses:Falcine acute Subdural hematoma  Discharge Diagnoses:  Active Problems:   Subdural hemorrhage   Subdural hematoma   Discharged Condition: good  Hospital Course: [name] was admitted after reportedly falling out of her bed at her home. The fall was not witnessed, but her family members heard the fall and quickly attended to her. Initial head CT in the [facility] ED revealed a small acute parafalcine subdural hematoma. Her exam was notable for slight confusion. Repeat head CT showed no change. Her mental status improved. At discharge she is at her baseline according to multiple family members. She is ok for discharge to home.   Treatments: observation  Discharge Exam: Blood pressure 115/65, pulse 89, temperature 97.4 F (36.3 C), temperature source Oral, resp. rate 18, weight 68 kg (149 lb 14.6 oz), SpO2 100 %. General appearance: alert, cooperative, appears stated age and no distress Neurologic: Mental status: Alert, oriented, thought content appropriate Cranial nerves: normal Motor: grossly normal Gait: moving slowly and requiring assistance  Disposition: 01-Home or Self Care * No surgery found *    Medication List    STOP taking these medications        clopidogrel 75 MG tablet  Commonly known as:  PLAVIX      TAKE these medications        acetaminophen 325 MG tablet  Commonly known as:  TYLENOL  Take 650 mg by mouth every 6 (six) hours as needed for pain.     CETAPHIL DAILY ADVANCE Lotn  Use bid     cyanocobalamin 1000 MCG tablet  Take 1 tablet (1,000 mcg total) by mouth daily.     escitalopram 5 MG tablet  Commonly known as:  LEXAPRO  Take 1 tablet (5 mg total) by mouth daily.     metFORMIN 1000 MG tablet  Commonly known as:  GLUCOPHAGE  Take 500 mg by mouth 2 (two) times daily with a  meal. Decrease to 1/2 twice a day with meals     polyethylene glycol powder powder  Commonly known as:  GLYCOLAX/MIRALAX  Take 17 g by mouth 2 (two) times daily as needed.     pravastatin 10 MG tablet  Commonly known as:  PRAVACHOL  Take 1 tablet (10 mg total) by mouth daily at 6 PM.     spironolactone 25 MG tablet  Commonly known as:  ALDACTONE  Take 1 tablet (25 mg total) by mouth daily.           Follow-up Information    Follow up with Eliese Kerwood L, MD In 4 weeks.   Specialty:  Neurosurgery   Why:  call office to make an appointment   Contact information:   [address] [place] [address] [phone]       Signed: Carmela HurtCABBELL,Layson Bertsch L 02/07/2015, 10:47 AM

## 2015-02-07 NOTE — Progress Notes (Addendum)
Pt still confused, but less agitated. Sitter now available and is with pt and family.

## 2015-02-09 ENCOUNTER — Telehealth: Payer: Self-pay | Admitting: *Deleted

## 2015-02-09 NOTE — Telephone Encounter (Signed)
Transition Care Management Follow-up Telephone Call D/C 02/07/15  How have you been since you were released from the hospital? Pt states she is doing ok   Do you understand why you were in the hospital? YES        Do you understand the discharge instrcutions? YES Items Reviewed:  Medications reviewed: YES  Allergies reviewed: YES/  Dietary changes reviewed: NO  Referrals reviewed: YES, will be seeing neurosurgery Dr. Franky Machoabbell in 4 wks   Functional Questionnaire:   Activities of Daily Living (ADLs):   She states she are independent in the following: ambulation, bathing and hygiene, feeding, continence, grooming, toileting and dressing States she require assistance with the following: ambulation   Any transportation issues/concerns?: YES  Any patient concerns? NO   Confirmed importance and date/time of follow-up visits scheduled: YES, made appt 02/22/15 w/Plotnikov   Confirmed with patient if condition begins to worsen call PCP or go to the ER.

## 2015-02-10 ENCOUNTER — Telehealth: Payer: Self-pay | Admitting: Geriatric Medicine

## 2015-02-10 NOTE — Telephone Encounter (Signed)
Spoke with patient and she said she will call us to have us order a mammogram when she is ready to get one done.

## 2015-02-11 ENCOUNTER — Telehealth: Payer: Self-pay

## 2015-02-11 NOTE — Telephone Encounter (Signed)
Call to the patient and introduced AWV but asked how she was as just dc from ER. Stated she is fine; has fup apt with neuro

## 2015-02-22 ENCOUNTER — Ambulatory Visit (INDEPENDENT_AMBULATORY_CARE_PROVIDER_SITE_OTHER): Payer: Commercial Managed Care - HMO | Admitting: Internal Medicine

## 2015-02-22 ENCOUNTER — Other Ambulatory Visit: Payer: Self-pay | Admitting: *Deleted

## 2015-02-22 ENCOUNTER — Encounter: Payer: Self-pay | Admitting: Internal Medicine

## 2015-02-22 VITALS — BP 124/80 | HR 90 | Wt 145.0 lb

## 2015-02-22 DIAGNOSIS — S065XAA Traumatic subdural hemorrhage with loss of consciousness status unknown, initial encounter: Secondary | ICD-10-CM

## 2015-02-22 DIAGNOSIS — I10 Essential (primary) hypertension: Secondary | ICD-10-CM

## 2015-02-22 DIAGNOSIS — E1159 Type 2 diabetes mellitus with other circulatory complications: Secondary | ICD-10-CM

## 2015-02-22 DIAGNOSIS — S065X9A Traumatic subdural hemorrhage with loss of consciousness of unspecified duration, initial encounter: Secondary | ICD-10-CM

## 2015-02-22 DIAGNOSIS — G3184 Mild cognitive impairment, so stated: Secondary | ICD-10-CM

## 2015-02-22 DIAGNOSIS — I6200 Nontraumatic subdural hemorrhage, unspecified: Secondary | ICD-10-CM | POA: Diagnosis not present

## 2015-02-22 DIAGNOSIS — R739 Hyperglycemia, unspecified: Secondary | ICD-10-CM

## 2015-02-22 MED ORDER — ESCITALOPRAM OXALATE 5 MG PO TABS: 5.0000 mg | ORAL_TABLET | Freq: Every day | ORAL | Status: DC

## 2015-02-22 MED ORDER — DONEPEZIL HCL 5 MG PO TABS: 5.0000 mg | ORAL_TABLET | Freq: Every day | ORAL | Status: DC

## 2015-02-22 MED ORDER — VITAMIN D3 50 MCG (2000 UT) PO CAPS: 2000.0000 [IU] | ORAL_CAPSULE | Freq: Every day | ORAL | Status: DC

## 2015-02-22 MED ORDER — METFORMIN HCL 500 MG PO TABS: 500.0000 mg | ORAL_TABLET | Freq: Two times a day (BID) | ORAL | Status: DC

## 2015-02-22 MED ORDER — CYANOCOBALAMIN 1000 MCG PO TABS: 1000.0000 ug | ORAL_TABLET | Freq: Every day | ORAL | Status: DC

## 2015-02-22 MED ORDER — SPIRONOLACTONE 25 MG PO TABS: 25.0000 mg | ORAL_TABLET | Freq: Every day | ORAL | Status: DC

## 2015-02-22 MED ORDER — PRAVASTATIN SODIUM 10 MG PO TABS: 10.0000 mg | ORAL_TABLET | Freq: Every day | ORAL | Status: DC

## 2015-02-22 NOTE — Progress Notes (Signed)
Pre visit review using our clinic review tool, if applicable. No additional management support is needed unless otherwise documented below in the visit note. 

## 2015-02-22 NOTE — Assessment & Plan Note (Signed)
On Metformin 

## 2015-02-22 NOTE — Assessment & Plan Note (Signed)
No relapse 

## 2015-02-22 NOTE — Assessment & Plan Note (Signed)
Worse - ?Alzheimers Start Aricept

## 2015-02-22 NOTE — Progress Notes (Signed)
Subjective:    HPI  Post-hosp f/up:  Admit date: 02/05/2015 Discharge date: 02/07/2015  Admission Diagnoses:Falcine acute Subdural hematoma  Discharge Diagnoses:  Active Problems:  Subdural hemorrhage  Subdural hematoma   Discharged Condition: good  Hospital Course: Ms. Redpath was admitted after reportedly falling out of her bed at her home. The fall was not witnessed, but her family members heard the fall and quickly attended to her. Initial head CT in the [facility] ED revealed a small acute parafalcine subdural hematoma. Her exam was notable for slight confusion. Repeat head CT showed no change. Her mental status improved. At discharge she is at her baseline according to multiple family members. She is ok for discharge to home.   Treatments: observation  Discharge Exam: Blood pressure 115/65, pulse 89, temperature 97.4 F (36.3 C), temperature source Oral, resp. rate 18, weight 68 kg (149 lb 14.6 oz), SpO2 100 %. General appearance: alert, cooperative, appears stated age and no distress Neurologic: Mental status: Alert, oriented, thought content appropriate Cranial nerves: normal Motor: grossly normal Gait: moving slowly and requiring assistance  Disposition: 01-Home or Self Care * No surgery found *    Medication List    STOP taking these medications       clopidogrel 75 MG tablet  Commonly known as: PLAVIX      TAKE these medications       acetaminophen 325 MG tablet  Commonly known as: TYLENOL  Take 650 mg by mouth every 6 (six) hours as needed for pain.     CETAPHIL DAILY ADVANCE Lotn  Use bid     cyanocobalamin 1000 MCG tablet  Take 1 tablet (1,000 mcg total) by mouth daily.     escitalopram 5 MG tablet  Commonly known as: LEXAPRO  Take 1 tablet (5 mg total) by mouth daily.     metFORMIN 1000 MG tablet  Commonly known as: GLUCOPHAGE  Take 500 mg by mouth 2 (two) times daily with a meal. Decrease to 1/2  twice a day with meals     polyethylene glycol powder powder  Commonly known as: GLYCOLAX/MIRALAX  Take 17 g by mouth 2 (two) times daily as needed.     pravastatin 10 MG tablet  Commonly known as: PRAVACHOL  Take 1 tablet (10 mg total) by mouth daily at 6 PM.     spironolactone 25 MG tablet  Commonly known as: ALDACTONE  Take 1 tablet (25 mg total) by mouth daily.           Follow-up Information    Follow up with CABBELL,KYLE L, MD In 4 weeks.   Specialty: Neurosurgery   Why: call office to make an appointment   Contact information:   [address] [place] [address] [phone]          C/o memory loss and confusion - not new  F/u palpitations w/Paxil F/u wt loss, nausea x 2 months, no appetite -much better F/u on her CVA, weakness, HTN  f/u diarrhea and sometimes constipation - chronic  Review of Systems  Constitutional: Positive for fatigue. Negative for fever, chills, diaphoresis, activity change, appetite change and unexpected weight change.  HENT: Negative for congestion, dental problem, ear pain, hearing loss, mouth sores, postnasal drip, sinus pressure, sneezing, sore throat and voice change.   Eyes: Negative for pain and visual disturbance.  Respiratory: Negative for cough, chest tightness, shortness of breath, wheezing and stridor.   Cardiovascular: Negative for chest pain, palpitations and leg swelling.  Gastrointestinal: Positive for nausea and constipation. Negative for vomiting, abdominal pain, diarrhea, blood in stool, abdominal distention and rectal pain.  Genitourinary: Negative for dysuria, hematuria, flank pain, decreased urine volume, vaginal bleeding, vaginal discharge, difficulty urinating, vaginal pain and menstrual problem.  Musculoskeletal: Negative for back pain, joint swelling, gait problem and neck pain.  Skin: Negative for color change, rash and wound.  Neurological: Negative for  dizziness, tremors, syncope, speech difficulty and light-headedness.  Hematological: Negative for adenopathy.  Psychiatric/Behavioral: Negative for suicidal ideas, hallucinations, behavioral problems, confusion, sleep disturbance, dysphoric mood and decreased concentration. The patient is nervous/anxious. The patient is not hyperactive.     Wt Readings from Last 3 Encounters:  02/22/15 145 lb (65.772 kg)  02/06/15 149 lb 14.6 oz (68 kg)  01/19/15 150 lb (68.04 kg)   BP Readings from Last 3 Encounters:  02/22/15 124/80  02/07/15 115/65  01/19/15 120/60       Objective:   Physical Exam  Constitutional: She appears well-developed. No distress.  HENT:  Head: Normocephalic.  Right Ear: External ear normal.  Left Ear: External ear normal.  Nose: Nose normal.  Mouth/Throat: Oropharynx is clear and moist.  Eyes: Conjunctivae are normal. Pupils are equal, round, and reactive to light. Right eye exhibits no discharge. Left eye exhibits no discharge.  Neck: Normal range of motion. Neck supple. No JVD present. No tracheal deviation present. No thyromegaly present.  Cardiovascular: Normal rate, regular rhythm and normal heart sounds.   Pulmonary/Chest: No stridor. No respiratory distress. She has no wheezes.  Abdominal: Soft. Bowel sounds are normal. She exhibits mass. She exhibits no distension. There is no tenderness. There is no rebound and no guarding.  Musculoskeletal: She exhibits no edema or tenderness.  Lymphadenopathy:    She has no cervical adenopathy.  Neurological: She displays normal reflexes. A cranial nerve deficit is present. She exhibits normal muscle tone. Coordination normal.  R facial droop less  weakness R side - better  Skin: No rash noted. No erythema.  Psychiatric: She has a normal mood and affect. Her behavior is normal.  Umbil hernia - NT Using a cane Dry skin   Lab Results  Component Value Date                                                                                                              Assessment & Plan:

## 2015-02-22 NOTE — Assessment & Plan Note (Signed)
Chronic On Spironolactone 

## 2015-02-24 ENCOUNTER — Telehealth: Payer: Self-pay | Admitting: *Deleted

## 2015-02-24 NOTE — Telephone Encounter (Signed)
Contacted the pt to inform her that per Dr Delton See her 14 day cardiac event monitor was normal and she was in normal sinus rhythm.  Pt verbalized understanding.

## 2015-02-26 ENCOUNTER — Ambulatory Visit: Payer: Commercial Managed Care - HMO | Admitting: Internal Medicine

## 2015-03-23 IMAGING — CT CT ABD-PELV W/ CM
2 of 5 series · 15 of 46 positions shown, 17 images · IV contrast (APPLIED)
Comparison: 12/23/2008

CLINICAL DATA: Mid abdominal pain for several weeks. Periumbilical
pain.

EXAM:
CT ABDOMEN AND PELVIS WITH CONTRAST
TECHNIQUE: Multidetector CT imaging of the abdomen and pelvis was performed
using the standard protocol following bolus administration of
intravenous contrast.
CONTRAST:  100mL OMNIPAQUE IOHEXOL 300 MG/ML  SOLN

[Series 2: abd/ pelvis 5.0 i30f 1 · axial · 0.77mm/px · z∈[+889,+1269]mm · 12 of 86 slices shown, 14 images]
[im 5/86  soft-tissue]
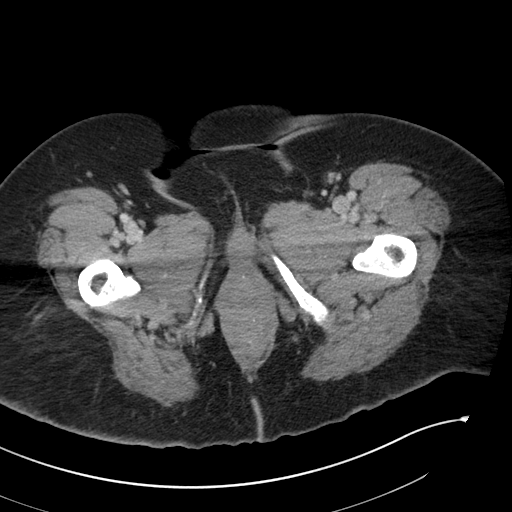
[im 5/86  bone]
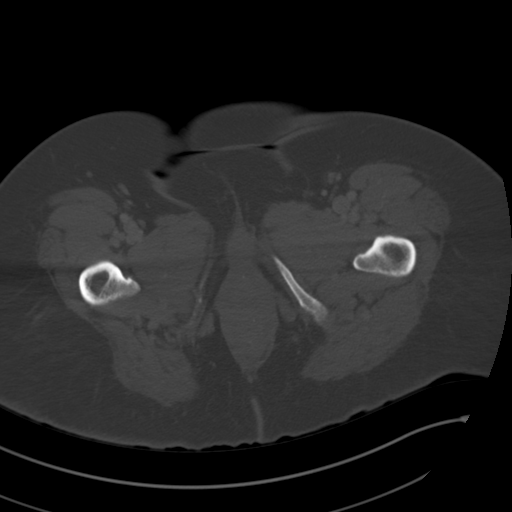
[im 13/86  soft-tissue]
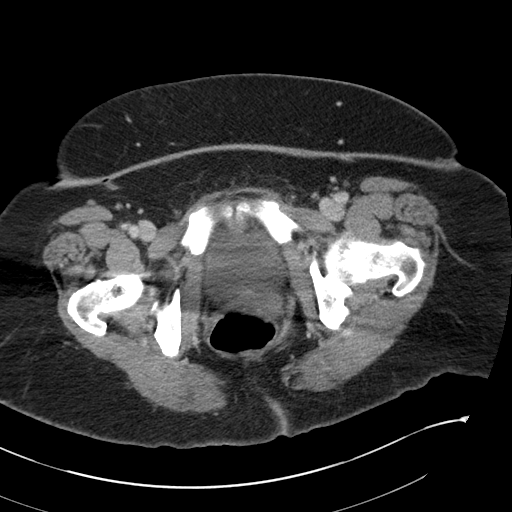
[im 18/86  soft-tissue]
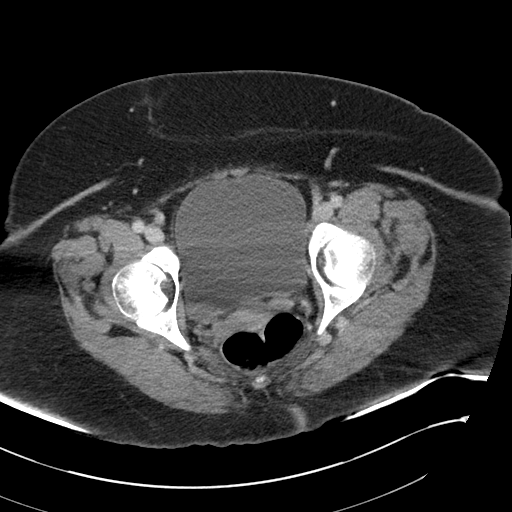
[im 26/86  soft-tissue]
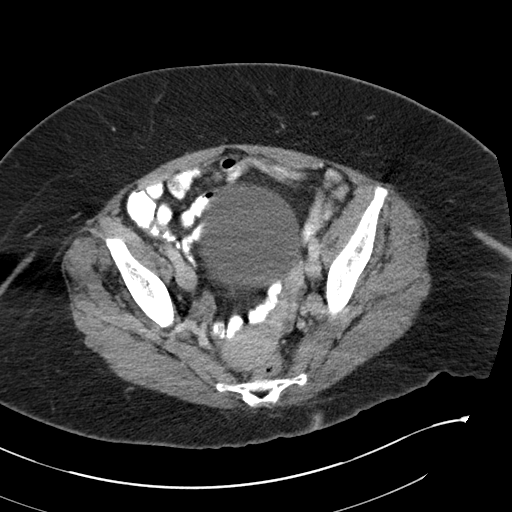
[im 35/86  soft-tissue]
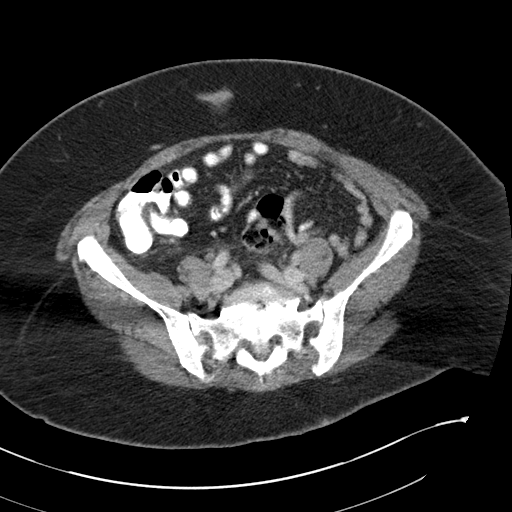
[im 39/86  soft-tissue]
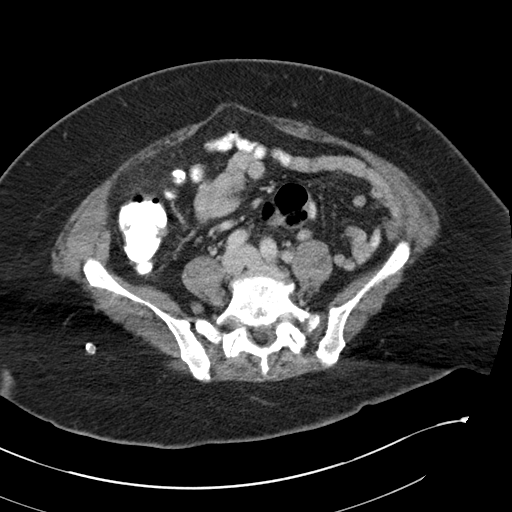
[im 47/86  soft-tissue]
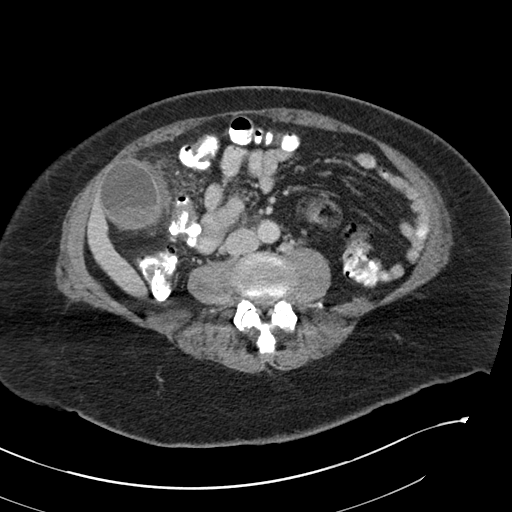
[im 52/86  soft-tissue]
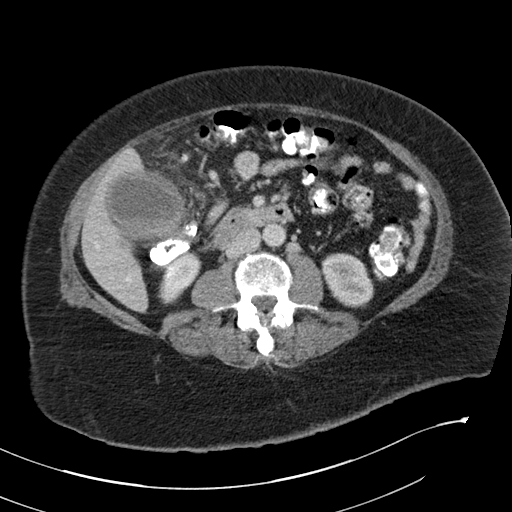
[im 60/86  soft-tissue]
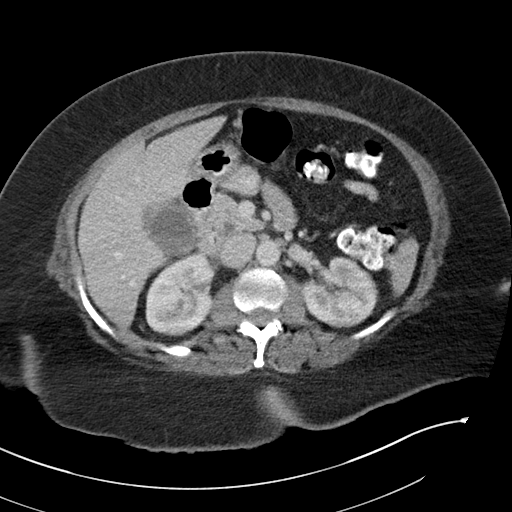
[im 60/86  bone]
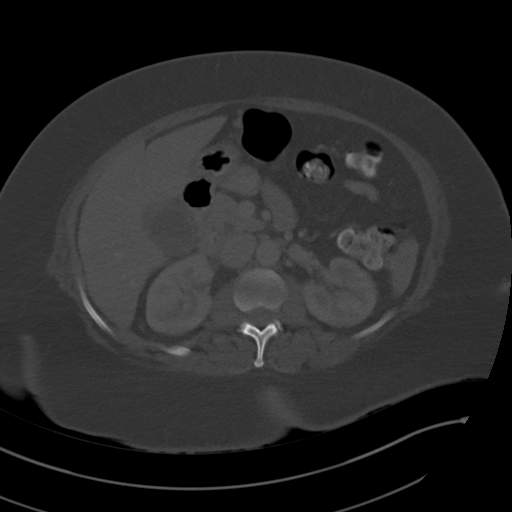
[im 69/86  soft-tissue]
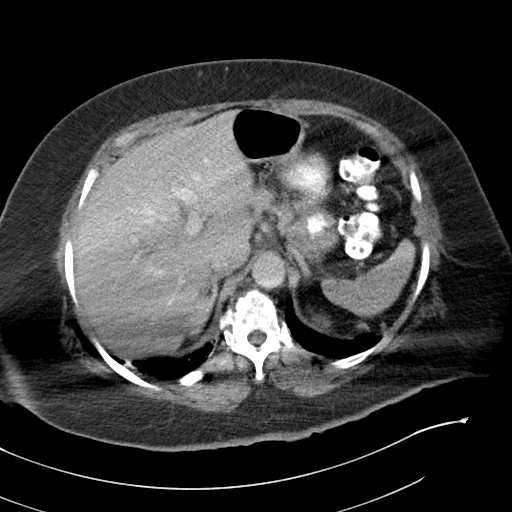
[im 73/86  soft-tissue]
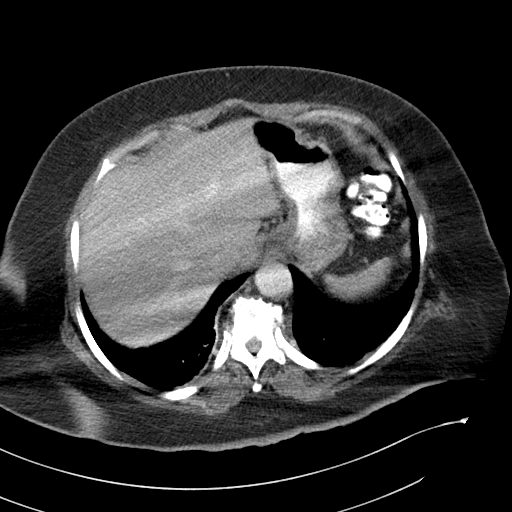
[im 81/86  soft-tissue]
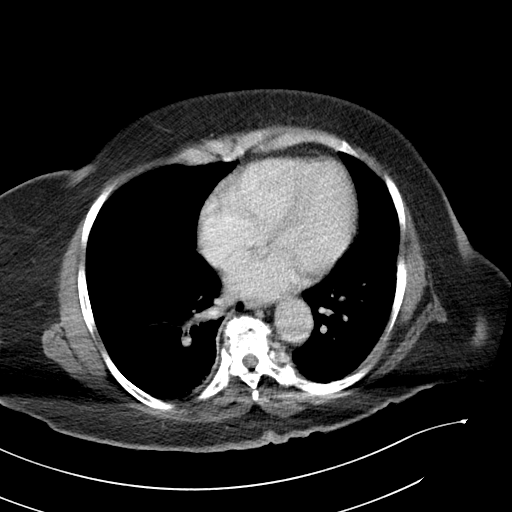

[Series 6: cor · coronal · 0.65mm/px · 3 of 127 slices shown]
[im 43/127  soft-tissue]
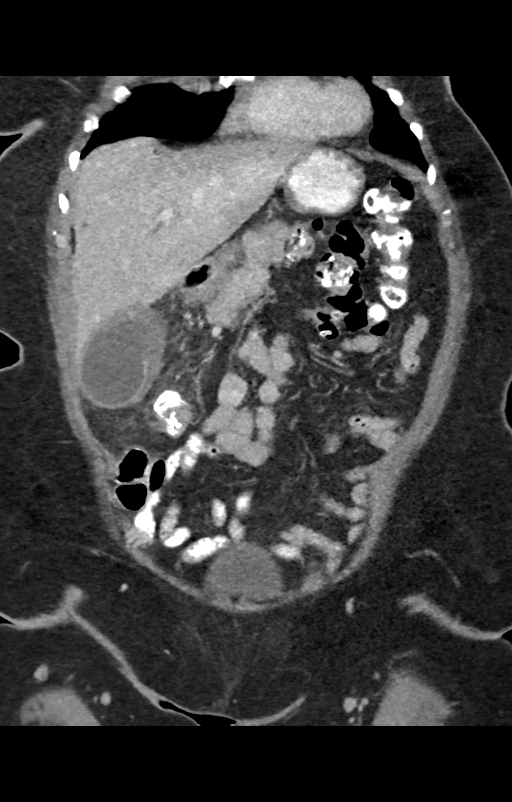
[im 57/127  soft-tissue]
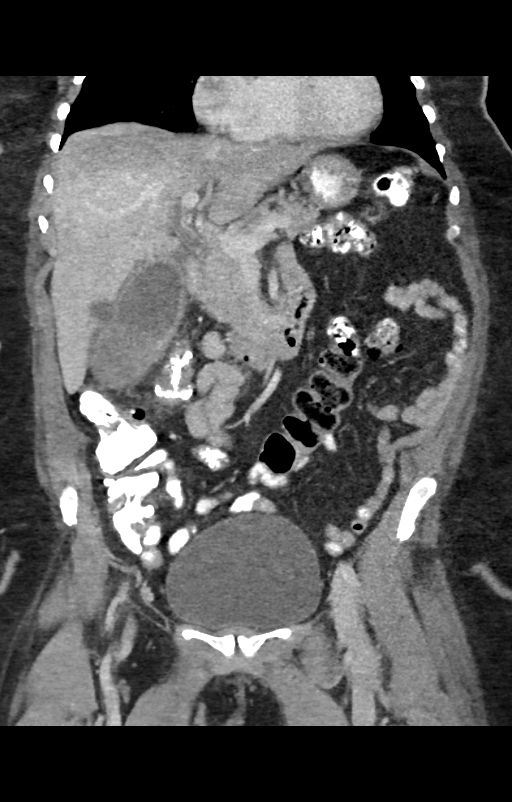
[im 71/127  soft-tissue]
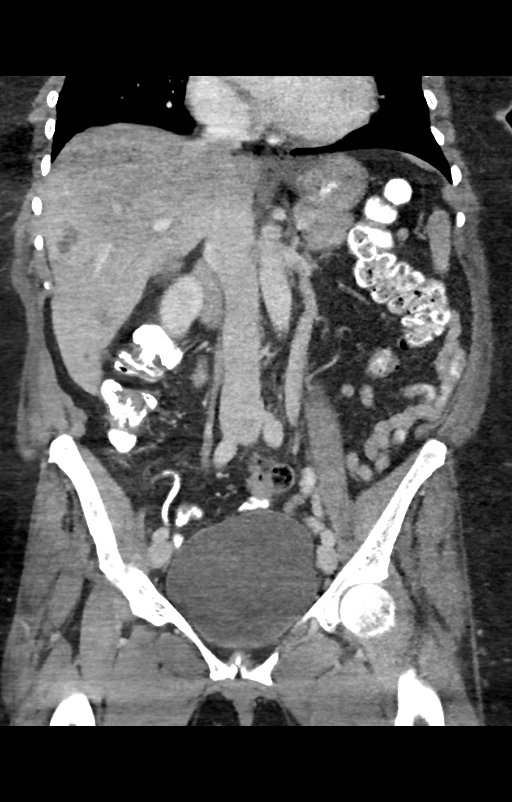

[15 of 46 positions shown; findings below may reference images not displayed]

FINDINGS: Lung bases are clear allowing for motion artifact. Streak artifact
from the arms limits visualization of some areas. Low-attenuation
lesions in the liver are with anterior segment right lobe of liver
lesion measuring 9 mm and inferior right lobe of liver lesion
cm. These are likely cysts and were present previously without
change.

The gallbladder is mildly distended and filled with sludge. The
gallbladder wall is diffusely thickened and edematous with
pericholecystic stranding. Changes are consistent with acute
cholecystitis. No bile duct dilatation. The spleen, pancreas,
adrenal glands, kidneys, abdominal aorta, inferior vena cava, and
retroperitoneal lymph nodes are unremarkable. The stomach, small
bowel, and colon are not abnormally distended. Contrast material
flows to the distal colon without evidence of obstruction. No free
air or free fluid in the abdomen.

Pelvis: Bladder wall is not thickened. Uterus and ovaries are not
enlarged. Appendix is normal. No evidence of diverticulitis. No free
or loculated pelvic fluid collections. Small umbilical hernia
containing fat. Degenerative changes in the lumbar spine.
Spondylolysis with mild spondylolisthesis at L4-5. No destructive
bone lesions appreciated.
IMPRESSION: Thick edematous gallbladder wall with gallbladder distention and
sludge and pericholecystic inflammatory stranding. Changes are
consistent with acute cholecystitis.

## 2015-03-30 ENCOUNTER — Ambulatory Visit: Payer: Commercial Managed Care - HMO | Admitting: Internal Medicine

## 2015-03-30 ENCOUNTER — Ambulatory Visit (INDEPENDENT_AMBULATORY_CARE_PROVIDER_SITE_OTHER): Payer: Commercial Managed Care - HMO | Admitting: Internal Medicine

## 2015-03-30 ENCOUNTER — Other Ambulatory Visit (INDEPENDENT_AMBULATORY_CARE_PROVIDER_SITE_OTHER): Payer: Commercial Managed Care - HMO

## 2015-03-30 ENCOUNTER — Encounter: Payer: Self-pay | Admitting: Internal Medicine

## 2015-03-30 VITALS — BP 110/80 | HR 98 | Wt 142.0 lb

## 2015-03-30 DIAGNOSIS — I10 Essential (primary) hypertension: Secondary | ICD-10-CM | POA: Diagnosis not present

## 2015-03-30 DIAGNOSIS — F0390 Unspecified dementia without behavioral disturbance: Secondary | ICD-10-CM | POA: Diagnosis not present

## 2015-03-30 DIAGNOSIS — I639 Cerebral infarction, unspecified: Secondary | ICD-10-CM | POA: Diagnosis not present

## 2015-03-30 DIAGNOSIS — E1159 Type 2 diabetes mellitus with other circulatory complications: Secondary | ICD-10-CM | POA: Diagnosis not present

## 2015-03-30 DIAGNOSIS — F411 Generalized anxiety disorder: Secondary | ICD-10-CM

## 2015-03-30 LAB — BASIC METABOLIC PANEL
BUN: 25 mg/dL — AB (ref 6–23)
CO2: 26 mEq/L (ref 19–32)
Calcium: 9.8 mg/dL (ref 8.4–10.5)
Chloride: 105 mEq/L (ref 96–112)
Creatinine, Ser: 0.87 mg/dL (ref 0.40–1.20)
GFR: 81.92 mL/min (ref >60.00)
Glucose, Bld: 103 mg/dL — ABNORMAL HIGH (ref 70–99)
Potassium: 4.5 mEq/L (ref 3.5–5.1)
SODIUM: 142 meq/L (ref 135–145)

## 2015-03-30 LAB — HEMOGLOBIN A1C: Hgb A1c MFr Bld: 5.5 % (ref 4.6–6.5)

## 2015-03-30 MED ORDER — METFORMIN HCL 500 MG PO TABS: 500.0000 mg | ORAL_TABLET | Freq: Every day | ORAL | Status: DC

## 2015-03-30 NOTE — Assessment & Plan Note (Signed)
On Lexapro 

## 2015-03-30 NOTE — Assessment & Plan Note (Signed)
BP Readings from Last 3 Encounters:  03/30/15 110/80  02/22/15 124/80  02/07/15 115/65

## 2015-03-30 NOTE — Assessment & Plan Note (Signed)
Start ASA 81 mg  

## 2015-03-30 NOTE — Progress Notes (Signed)
Pre visit review using our clinic review tool, if applicable. No additional management support is needed unless otherwise documented below in the visit note. 

## 2015-03-30 NOTE — Progress Notes (Signed)
Subjective:  Patient ID: Tracy [name], female    DOB: 30-Jul-1941  Age: 74 y.o. MRN: [id number]  CC: No chief complaint on file.   HPI   Tracy [name] presents for memory loss, CVA, dry skin, HTN  Outpatient Prescriptions Prior to Visit  Medication Sig Dispense Refill  . acetaminophen (TYLENOL) 325 MG tablet Take 650 mg by mouth every 6 (six) hours as needed for pain.    . Cholecalciferol (VITAMIN D3) 2000 UNITS capsule Take 1 capsule (2,000 Units total) by mouth daily. 100 capsule 3  . cyanocobalamin 1000 MCG tablet Take 1 tablet (1,000 mcg total) by mouth daily. 100 tablet 3  . donepezil (ARICEPT) 5 MG tablet Take 1 tablet (5 mg total) by mouth at bedtime. 30 tablet 11  . Emollient (CETAPHIL DAILY ADVANCE) LOTN Use bid 473 g 2  . escitalopram (LEXAPRO) 5 MG tablet Take 1 tablet (5 mg total) by mouth daily. 30 tablet 5  . metFORMIN (GLUCOPHAGE) 500 MG tablet Take 1 tablet (500 mg total) by mouth 2 (two) times daily with a meal. 60 tablet 11  . polyethylene glycol powder (GLYCOLAX/MIRALAX) powder Take 17 g by mouth 2 (two) times daily as needed. 527 g 11  . pravastatin (PRAVACHOL) 10 MG tablet Take 1 tablet (10 mg total) by mouth daily at 6 PM. 30 tablet 1  . spironolactone (ALDACTONE) 25 MG tablet Take 1 tablet (25 mg total) by mouth daily. 30 tablet 11   No facility-administered medications prior to visit.    ROS Review of Systems  Constitutional: Negative for chills, activity change, appetite change, fatigue and unexpected weight change.  HENT: Negative for congestion, mouth sores and sinus pressure.   Eyes: Negative for visual disturbance.  Respiratory: Negative for cough and chest tightness.   Gastrointestinal: Negative for nausea and abdominal pain.  Genitourinary: Negative for frequency, difficulty urinating and vaginal pain.  Musculoskeletal: Positive for arthralgias. Negative for back pain and gait problem.  Skin: Negative for pallor and rash.  Neurological:  Negative for dizziness, tremors, syncope, weakness, numbness and headaches.  Psychiatric/Behavioral: Positive for decreased concentration. Negative for suicidal ideas, confusion and sleep disturbance. The patient is nervous/anxious.     Objective:  BP 110/80 mmHg  Pulse 98  Wt 142 lb (64.411 kg)  SpO2 99%  BP Readings from Last 3 Encounters:  03/30/15 110/80  02/22/15 124/80  02/07/15 115/65    Wt Readings from Last 3 Encounters:  03/30/15 142 lb (64.411 kg)  02/22/15 145 lb (65.772 kg)  02/06/15 149 lb 14.6 oz (68 kg)    Physical Exam  Constitutional: She appears well-developed. No distress.  HENT:  Head: Normocephalic.  Right Ear: External ear normal.  Left Ear: External ear normal.  Nose: Nose normal.  Mouth/Throat: Oropharynx is clear and moist.  Eyes: Conjunctivae are normal. Pupils are equal, round, and reactive to light. Right eye exhibits no discharge. Left eye exhibits no discharge.  Neck: Normal range of motion. Neck supple. No JVD present. No tracheal deviation present. No thyromegaly present.  Cardiovascular: Normal rate, regular rhythm and normal heart sounds.   Pulmonary/Chest: No stridor. No respiratory distress. She has no wheezes.  Abdominal: Soft. Bowel sounds are normal. She exhibits no distension and no mass. There is no tenderness. There is no rebound and no guarding.  Musculoskeletal: She exhibits no edema or tenderness.  Lymphadenopathy:    She has no cervical adenopathy.  Neurological: She displays normal reflexes. No cranial nerve deficit. She exhibits normal muscle  tone. Coordination normal.  Skin: No rash noted. No erythema.  Psychiatric: She has a normal mood and affect. Her behavior is normal.  Alert, cooperative, disoriented  Lab Results  Component Value Date   WBC 7.0 02/05/2015   HGB 11.4* 02/05/2015   HCT 34.1* 02/05/2015   PLT 344 02/05/2015   GLUCOSE 87 02/05/2015   CHOL 129 10/27/2014   TRIG 82 10/27/2014   HDL 45 10/27/2014    LDLCALC 68 10/27/2014   ALT 10* 02/05/2015   AST 15 02/05/2015   NA 142 02/05/2015   K 4.1 02/05/2015   CL 106 02/05/2015   CREATININE 0.75 02/05/2015   BUN 14 02/05/2015   CO2 27 02/05/2015   TSH 4.305 10/28/2014   INR 1.12 02/05/2015   HGBA1C 5.6 11/27/2014    Dg Chest 2 View  02/05/2015   CLINICAL DATA:  Fall out of bed this morning.  EXAM: CHEST  2 VIEW  COMPARISON:  01/15/2015  FINDINGS: Heart is upper limits normal in size. No confluent airspace opacities, effusions or edema. No acute bony abnormality. Degenerative changes in the shoulders and thoracic spine.  IMPRESSION: No active cardiopulmonary disease.   Electronically Signed   By: Charlett NoseKevin  Dover M.D.   On: 02/05/2015 16:55   Ct Head Wo Contrast  02/06/2015   CLINICAL DATA:  Subdural hematoma after fall from bed.  EXAM: CT HEAD WITHOUT CONTRAST  TECHNIQUE: Contiguous axial images were obtained from the base of the skull through the vertex without intravenous contrast.  COMPARISON:  02/05/2015  FINDINGS: Skull and Sinuses:Negative for fracture or destructive process. The mastoids, middle ears, and imaged paranasal sinuses are clear.  Orbits: Dysconjugate gaze.  Brain: Stable subdural hematoma along the left tentorium and in the anterior interhemispheric fissure. The latter is the thickest, measuring up to 8 mm. Small extra-axial hemorrhage also present around the lateral high left frontal lobe. Stable ventriculomegaly, most notable in the frontal horns, considered ex vacuo. Stable lacunar infarcts in the left deep gray nuclei. No evidence of acute infarct .  IMPRESSION: Stable subdural hematoma, up to 8 mm in thickness at the interhemispheric fissure. No shift or significant mass effect.   Electronically Signed   By: Marnee SpringJonathon  Watts M.D.   On: 02/06/2015 06:42   Ct Head Wo Contrast  02/05/2015   CLINICAL DATA:  Fall from bed.  Frontal headache  EXAM: CT HEAD WITHOUT CONTRAST  TECHNIQUE: Contiguous axial images were obtained from the base  of the skull through the vertex without intravenous contrast.  COMPARISON:  10/27/2014  FINDINGS: There is a large parafalcine subdural hematoma overlying the left cerebral hemisphere. This has a maximum thickness of 9 mm, image 22/series 2. There is associated mass effect upon the left frontal and parietal lobe. The hematoma extends posteriorly and over the left side of the tentorium, image 14/ series 2. There is a smaller hematoma which overlies the left frontal lobe, image 23/series 2. This measures 5 mm. There is mild patchy low attenuation within the subcortical and periventricular white matter compatible with chronic microvascular disease. Prominence of the sulci and ventricles noted consistent with brain atrophy. Chronic left basal ganglia lacunar infarct is noted. The paranasal sinuses and mastoid air cells are clear. The calvarium appears intact.  IMPRESSION: 1. Acute left parafalcine subdural hematoma which extends posteriorly an along the left tentorium. A smaller sub dural overlies the left frontal lobe convexity measuring 5 mm. 2. Small vessel ischemic disease and mild brain atrophy. Critical Value/emergent results were called  by telephone at the time of interpretation on 02/05/2015 at 4:15 pm to Dr. Junius Finner , who verbally acknowledged these results.   Electronically Signed   By: Signa Kell M.D.   On: 02/05/2015 16:15    Assessment & Plan:   There are no diagnoses linked to this encounter. I am having Ms. Decarolis maintain her acetaminophen, polyethylene glycol powder, CETAPHIL DAILY ADVANCE, donepezil, metFORMIN, escitalopram, pravastatin, spironolactone, cyanocobalamin, and Vitamin D3.  No orders of the defined types were placed in this encounter.     Follow-up: No Follow-up on file.  Sonda Primes, MD

## 2015-03-30 NOTE — Assessment & Plan Note (Addendum)
Reduce Metformin to once a day

## 2015-03-30 NOTE — Assessment & Plan Note (Signed)
On Aricept 

## 2015-04-08 ENCOUNTER — Telehealth: Payer: Self-pay | Admitting: Internal Medicine

## 2015-04-08 NOTE — Telephone Encounter (Signed)
Please advise, thanks.

## 2015-04-08 NOTE — Telephone Encounter (Signed)
Pt called in said that since Dr Macario Golds changed her meds (metFORMIN (GLUCOPHAGE) 500 MG tablet [[id number]])  She has not been able to sleep.  Please advise, She wanted something for sleep to be called in?   Best number [phone]  Pharmacy -[facility] on [address]

## 2015-04-09 NOTE — Telephone Encounter (Signed)
Take Benadryl 12.5 or 25 mg at night Thx

## 2015-04-09 NOTE — Telephone Encounter (Signed)
Advised patient of dr plotnikovs note 

## 2015-04-12 ENCOUNTER — Ambulatory Visit: Payer: Commercial Managed Care - HMO | Admitting: Internal Medicine

## 2015-04-25 ENCOUNTER — Encounter (HOSPITAL_COMMUNITY): Payer: Self-pay | Admitting: Emergency Medicine

## 2015-04-25 ENCOUNTER — Emergency Department (HOSPITAL_COMMUNITY)
Admission: EM | Admit: 2015-04-25 | Discharge: 2015-04-25 | Disposition: A | Discharge status: Home / Self Care | Payer: Commercial Managed Care - HMO | Attending: Emergency Medicine | Admitting: Emergency Medicine

## 2015-04-25 ENCOUNTER — Emergency Department (HOSPITAL_COMMUNITY): Payer: Commercial Managed Care - HMO

## 2015-04-25 DIAGNOSIS — E119 Type 2 diabetes mellitus without complications: Secondary | ICD-10-CM | POA: Diagnosis not present

## 2015-04-25 DIAGNOSIS — F329 Major depressive disorder, single episode, unspecified: Secondary | ICD-10-CM | POA: Diagnosis not present

## 2015-04-25 DIAGNOSIS — Z79899 Other long term (current) drug therapy: Secondary | ICD-10-CM | POA: Diagnosis not present

## 2015-04-25 DIAGNOSIS — I10 Essential (primary) hypertension: Secondary | ICD-10-CM | POA: Insufficient documentation

## 2015-04-25 DIAGNOSIS — R079 Chest pain, unspecified: Secondary | ICD-10-CM | POA: Diagnosis not present

## 2015-04-25 DIAGNOSIS — R002 Palpitations: Secondary | ICD-10-CM | POA: Insufficient documentation

## 2015-04-25 DIAGNOSIS — Z8673 Personal history of transient ischemic attack (TIA), and cerebral infarction without residual deficits: Secondary | ICD-10-CM | POA: Diagnosis not present

## 2015-04-25 DIAGNOSIS — F419 Anxiety disorder, unspecified: Secondary | ICD-10-CM | POA: Diagnosis not present

## 2015-04-25 DIAGNOSIS — M79602 Pain in left arm: Secondary | ICD-10-CM | POA: Insufficient documentation

## 2015-04-25 DIAGNOSIS — R0602 Shortness of breath: Secondary | ICD-10-CM | POA: Diagnosis not present

## 2015-04-25 LAB — CBC
HEMATOCRIT: 35.9 % — AB (ref 36.0–46.0)
Hemoglobin: 12 g/dL (ref 12.0–15.0)
MCH: 31.2 pg (ref 26.0–34.0)
MCHC: 33.4 g/dL (ref 30.0–36.0)
MCV: 93.2 fL (ref 78.0–100.0)
PLATELETS: 352 10*3/uL (ref 150–400)
RBC: 3.85 MIL/uL — AB (ref 3.87–5.11)
RDW: 15.3 % (ref 11.5–15.5)
WBC: 6.1 10*3/uL (ref 4.0–10.5)

## 2015-04-25 LAB — BASIC METABOLIC PANEL
Anion gap: 10 (ref 5–15)
BUN: 19 mg/dL (ref 6–20)
CALCIUM: 9.2 mg/dL (ref 8.9–10.3)
CO2: 26 mmol/L (ref 22–32)
Chloride: 105 mmol/L (ref 101–111)
Creatinine, Ser: 0.97 mg/dL (ref 0.44–1.00)
GFR calc Af Amer: >60 mL/min (ref >60)
GFR, EST NON AFRICAN AMERICAN: 57 mL/min — AB (ref >60)
Glucose, Bld: 102 mg/dL — ABNORMAL HIGH (ref 65–99)
Potassium: 3.9 mmol/L (ref 3.5–5.1)
SODIUM: 141 mmol/L (ref 135–145)

## 2015-04-25 LAB — I-STAT TROPONIN, ED: Troponin i, poc: 0 ng/mL (ref 0.00–0.08)

## 2015-04-25 MED ORDER — ALPRAZOLAM 0.25 MG PO TABS: 0.2500 mg | ORAL_TABLET | Freq: Three times a day (TID) | ORAL | Status: DC | PRN

## 2015-04-25 NOTE — Discharge Instructions (Signed)
It was our pleasure to provide your ER care today - we hope that you feel better.  Rest.   Avoid caffeine use.  If anxiety/stress, you may try taking xanax as prescribed, as need - no driving when taking this medication.  Follow up with your doctor in the coming week.  Return to ER if worse, new symptoms, persistent fast heart beat, faint, chest pain, trouble breathing, other concern.     Palpitations A palpitation is the feeling that your heartbeat is irregular or is faster than normal. It may feel like your heart is fluttering or skipping a beat. Palpitations are usually not a serious problem. However, in some cases, you may need further medical evaluation. CAUSES  Palpitations can be caused by:  Smoking.  Caffeine or other stimulants, such as diet pills or energy drinks.  Alcohol.  Stress and anxiety.  Strenuous physical activity.  Fatigue.  Certain medicines.  Heart disease, especially if you have a history of irregular heart rhythms (arrhythmias), such as atrial fibrillation, atrial flutter, or supraventricular tachycardia.  An improperly working pacemaker or defibrillator. DIAGNOSIS  To find the cause of your palpitations, your health care provider will take your medical history and perform a physical exam. Your health care provider may also have you take a test called an ambulatory electrocardiogram (ECG). An ECG records your heartbeat patterns over a 24-hour period. You may also have other tests, such as:  Transthoracic echocardiogram (TTE). During echocardiography, sound waves are used to evaluate how blood flows through your heart.  Transesophageal echocardiogram (TEE).  Cardiac monitoring. This allows your health care provider to monitor your heart rate and rhythm in real time.  Holter monitor. This is a portable device that records your heartbeat and can help diagnose heart arrhythmias. It allows your health care provider to track your heart activity for  several days, if needed.  Stress tests by exercise or by giving medicine that makes the heart beat faster. TREATMENT  Treatment of palpitations depends on the cause of your symptoms and can vary greatly. Most cases of palpitations do not require any treatment other than time, relaxation, and monitoring your symptoms. Other causes, such as atrial fibrillation, atrial flutter, or supraventricular tachycardia, usually require further treatment. HOME CARE INSTRUCTIONS   Avoid:  Caffeinated coffee, tea, soft drinks, diet pills, and energy drinks.  Chocolate.  Alcohol.  Stop smoking if you smoke.  Reduce your stress and anxiety. Things that can help you relax include:  A method of controlling things in your body, such as your heartbeats, with your mind (biofeedback).  Yoga.  Meditation.  Physical activity such as swimming, jogging, or walking.  Get plenty of rest and sleep. SEEK MEDICAL CARE IF:   You continue to have a fast or irregular heartbeat beyond 24 hours.  Your palpitations occur more often. SEEK IMMEDIATE MEDICAL CARE IF:  You have chest pain or shortness of breath.  You have a severe headache.  You feel dizzy or you faint. MAKE SURE YOU:  Understand these instructions.  Will watch your condition.  Will get help right away if you are not doing well or get worse. Document Released: 08/25/2000 Document Revised: 09/02/2013 Document Reviewed: 10/27/2011 [facility] Patient Information 2015 [place], [place]. This information is not intended to replace advice given to you by your health care provider. Make sure you discuss any questions you have with your health care provider.    Stress and Stress Management Stress is a normal reaction to life events. It is what  you feel when life demands more than you are used to or more than you can handle. Some stress can be useful. For example, the stress reaction can help you catch the last bus of the day, study for a test, or meet  a deadline at work. But stress that occurs too often or for too long can cause problems. It can affect your emotional health and interfere with relationships and normal daily activities. Too much stress can weaken your immune system and increase your risk for physical illness. If you already have a medical problem, stress can make it worse. CAUSES  All sorts of life events may cause stress. An event that causes stress for one person may not be stressful for another person. Major life events commonly cause stress. These may be positive or negative. Examples include losing your job, moving into a new home, getting married, having a baby, or losing a loved one. Less obvious life events may also cause stress, especially if they occur day after day or in combination. Examples include working long hours, driving in traffic, caring for children, being in debt, or being in a difficult relationship. SIGNS AND SYMPTOMS Stress may cause emotional symptoms including, the following:  Anxiety. This is feeling worried, afraid, on edge, overwhelmed, or out of control.  Anger. This is feeling irritated or impatient.  Depression. This is feeling sad, down, helpless, or guilty.  Difficulty focusing, remembering, or making decisions. Stress may cause physical symptoms, including the following:   Aches and pains. These may affect your head, neck, back, stomach, or other areas of your body.  Tight muscles or clenched jaw.  Low energy or trouble sleeping. Stress may cause unhealthy behaviors, including the following:   Eating to feel better (overeating) or skipping meals.  Sleeping too little, too much, or both.  Working too much or putting off tasks (procrastination).  Smoking, drinking alcohol, or using drugs to feel better. DIAGNOSIS  Stress is diagnosed through an assessment by your health care provider. Your health care provider will ask questions about your symptoms and any stressful life events.Your  health care provider will also ask about your medical history and may order blood tests or other tests. Certain medical conditions and medicine can cause physical symptoms similar to stress. Mental illness can cause emotional symptoms and unhealthy behaviors similar to stress. Your health care provider may refer you to a mental health professional for further evaluation.  TREATMENT  Stress management is the recommended treatment for stress.The goals of stress management are reducing stressful life events and coping with stress in healthy ways.  Techniques for reducing stressful life events include the following:  Stress identification. Self-monitor for stress and identify what causes stress for you. These skills may help you to avoid some stressful events.  Time management. Set your priorities, keep a calendar of events, and learn to say "no." These tools can help you avoid making too many commitments. Techniques for coping with stress include the following:  Rethinking the problem. Try to think realistically about stressful events rather than ignoring them or overreacting. Try to find the positives in a stressful situation rather than focusing on the negatives.  Exercise. Physical exercise can release both physical and emotional tension. The key is to find a form of exercise you enjoy and do it regularly.  Relaxation techniques. These relax the body and mind. Examples include yoga, meditation, tai chi, biofeedback, deep breathing, progressive muscle relaxation, listening to music, being out in nature, journaling, and other  hobbies. Again, the key is to find one or more that you enjoy and can do regularly.  Healthy lifestyle. Eat a balanced diet, get plenty of sleep, and do not smoke. Avoid using alcohol or drugs to relax.  Strong support network. Spend time with family, friends, or other people you enjoy being around.Express your feelings and talk things over with someone you trust. Counseling  or talktherapy with a mental health professional may be helpful if you are having difficulty managing stress on your own. Medicine is typically not recommended for the treatment of stress.Talk to your health care provider if you think you need medicine for symptoms of stress. HOME CARE INSTRUCTIONS  Keep all follow-up visits as directed by your health care provider.  Take all medicines as directed by your health care provider. SEEK MEDICAL CARE IF:  Your symptoms get worse or you start having new symptoms.  You feel overwhelmed by your problems and can no longer manage them on your own. SEEK IMMEDIATE MEDICAL CARE IF:  You feel like hurting yourself or someone else. Document Released: 02/21/2001 Document Revised: 01/12/2014 Document Reviewed: 04/22/2013 [facility] Patient Information 2015 [place], [place]. This information is not intended to replace advice given to you by your health care provider. Make sure you discuss any questions you have with your health care provider.

## 2015-04-25 NOTE — ED Provider Notes (Signed)
CSN: [id number]     Arrival date & time 04/25/15  0347 History   First MD Initiated Contact with Patient 04/25/15 [phone]     Chief Complaint  Patient presents with  . Arm Pain  . Palpitations     (Consider location/radiation/quality/duration/timing/severity/associated sxs/prior Treatment) Patient is a 74 y.o. female presenting with arm pain and palpitations. The history is provided by the patient.  Arm Pain Pertinent negatives include no chest pain, no abdominal pain, no headaches and no shortness of breath.  Palpitations Associated symptoms: no back pain, no chest pain, no shortness of breath and no vomiting   Patient c/o palpitations intermittently for the past week. Occurs at rest. Lasts seconds per episode. Intermittent. No relation to activity or exertion. No associated cp or discomfort. No sob or unusual doe. No fatigue. No nv or diaphoresis. No syncope. No hx dysrhythmia. Has has same palpitations in past, and states prior outpatient monitoring negative. No heavy caffeine use. No etoh abuse. No recent change in meds. Pt indicates several recent stressors. Denies depression.     Past Medical History  Diagnosis Date  . Diabetes mellitus without complication   . Hypertension   . Stroke   . Acute cholecystitis 08/10/2013    Lap chole on 08/13/13   . Depression   . Anxiety   . Restless leg syndrome    Past Surgical History  Procedure Laterality Date  . Ercp N/A 08/12/2013    Procedure: ENDOSCOPIC RETROGRADE CHOLANGIOPANCREATOGRAPHY (ERCP);  Surgeon: Theda Belfast, MD;  Location: [facility] ENDOSCOPY;  Service: Endoscopy;  Laterality: N/A;  . Sphincterotomy  08/12/2013    Procedure: SPHINCTEROTOMY;  Surgeon: Theda Belfast, MD;  Location: [facility] ENDOSCOPY;  Service: Endoscopy;;  . Cholecystectomy N/A 08/13/2013    Procedure: LAPAROSCOPIC CHOLECYSTECTOMY ;  Surgeon: Currie Paris, MD;  Location: [facility] OR;  Service: General;  Laterality: N/A;  . Incisional hernia repair N/A 08/20/2013     Procedure: HERNIA REPAIR INCISIONAL UMBILICAL;  Surgeon: Velora Heckler, MD;  Location: [facility] OR;  Service: General;  Laterality: N/A;   Family History  Problem Relation Age of Onset  . Stroke Mother    Social History  Substance Use Topics  . Smoking status: Never Smoker   . Smokeless tobacco: None  . Alcohol Use: No   OB History    No data available     Review of Systems  Constitutional: Negative for fever and chills.  HENT: Negative for sore throat.   Eyes: Negative for photophobia.  Respiratory: Negative for shortness of breath.   Cardiovascular: Positive for palpitations. Negative for chest pain and leg swelling.  Gastrointestinal: Negative for vomiting, abdominal pain and diarrhea.  Genitourinary: Negative for flank pain.  Musculoskeletal: Negative for back pain and neck pain.  Skin: Negative for rash.  Neurological: Negative for headaches.  Hematological: Does not bruise/bleed easily.  Psychiatric/Behavioral: Negative for confusion.      Allergies  Wellbutrin  Home Medications   Prior to Admission medications   Medication Sig Start Date End Date Taking? Authorizing Provider  acetaminophen (TYLENOL) 325 MG tablet Take 650 mg by mouth every 6 (six) hours as needed for pain.    Historical Provider, MD  Cholecalciferol (VITAMIN D3) 2000 UNITS capsule Take 1 capsule (2,000 Units total) by mouth daily. 02/22/15   Aleksei Plotnikov V, MD  cyanocobalamin 1000 MCG tablet Take 1 tablet (1,000 mcg total) by mouth daily. 02/22/15   Aleksei Plotnikov V, MD  donepezil (ARICEPT) 5 MG tablet Take 1 tablet (  5 mg total) by mouth at bedtime. 02/22/15   Aleksei Plotnikov V, MD  Emollient (CETAPHIL DAILY ADVANCE) LOTN Use bid 11/27/14   Aleksei Plotnikov V, MD  escitalopram (LEXAPRO) 5 MG tablet Take 1 tablet (5 mg total) by mouth daily. 02/22/15   Aleksei Plotnikov V, MD  metFORMIN (GLUCOPHAGE) 500 MG tablet Take 1 tablet (500 mg total) by mouth daily with breakfast. 03/30/15   Jacinta Shoe V, MD  polyethylene glycol powder (GLYCOLAX/MIRALAX) powder Take 17 g by mouth 2 (two) times daily as needed. 02/03/14   Aleksei Plotnikov V, MD  pravastatin (PRAVACHOL) 10 MG tablet Take 1 tablet (10 mg total) by mouth daily at 6 PM. 02/22/15   Jacinta Shoe V, MD  spironolactone (ALDACTONE) 25 MG tablet Take 1 tablet (25 mg total) by mouth daily. 02/22/15   Aleksei Plotnikov V, MD   BP 126/71 mmHg  Pulse 87  Temp(Src) 97.2 F (36.2 C) (Oral)  Resp 16  Ht 5\' 5"  (1.651 m)  Wt 142 lb (64.411 kg)  BMI 23.63 kg/m2  SpO2 100% Physical Exam  Constitutional: She is oriented to person, place, and time. She appears well-developed and well-nourished. No distress.  HENT:  Mouth/Throat: Oropharynx is clear and moist.  Eyes: Conjunctivae are normal. No scleral icterus.  Neck: Neck supple. No tracheal deviation present. No thyromegaly present.  Cardiovascular: Normal rate, regular rhythm, normal heart sounds and intact distal pulses.  Exam reveals no gallop and no friction rub.   No murmur heard. Pulmonary/Chest: Effort normal and breath sounds normal. No respiratory distress.  Abdominal: Soft. Normal appearance and bowel sounds are normal. She exhibits no distension. There is no tenderness.  Musculoskeletal: She exhibits no edema or tenderness.  Neurological: She is alert and oriented to person, place, and time.  Skin: Skin is warm and dry. No rash noted. She is not diaphoretic.  Psychiatric:  Mildly anxious.   Nursing note and vitals reviewed.   ED Course  Procedures (including critical care time) Labs Review  Results for orders placed or performed during the hospital encounter of 04/25/15  Basic metabolic panel  Result Value Ref Range   Sodium 141 135 - 145 mmol/L   Potassium 3.9 3.5 - 5.1 mmol/L   Chloride 105 101 - 111 mmol/L   CO2 26 22 - 32 mmol/L   Glucose, Bld 102 (H) 65 - 99 mg/dL   BUN 19 6 - 20 mg/dL   Creatinine, Ser 1.61 0.44 - 1.00 mg/dL   Calcium 9.2 8.9 -  09.6 mg/dL   GFR calc non Af Amer 57 (L) >60 mL/min   GFR calc Af Amer >60 >60 mL/min   Anion gap 10 5 - 15  CBC  Result Value Ref Range   WBC 6.1 4.0 - 10.5 K/uL   RBC 3.85 (L) 3.87 - 5.11 MIL/uL   Hemoglobin 12.0 12.0 - 15.0 g/dL   HCT 04.5 (L) 40.9 - 81.1 %   MCV 93.2 78.0 - 100.0 fL   MCH 31.2 26.0 - 34.0 pg   MCHC 33.4 30.0 - 36.0 g/dL   RDW 91.4 78.2 - 95.6 %   Platelets 352 150 - 400 K/uL  I-stat troponin, ED  Result Value Ref Range   Troponin i, poc 0.00 0.00 - 0.08 ng/mL   Comment 3           Dg Chest 2 View  04/25/2015   CLINICAL DATA:  Acute onset of shortness of breath, generalized chest pain and left arm pain.  Initial encounter.  EXAM: CHEST  2 VIEW  COMPARISON:  Chest radiograph performed 02/05/2015  FINDINGS: The lungs are well-aerated and clear. There is no evidence of focal opacification, pleural effusion or pneumothorax.  The heart is normal in size; the mediastinal contour is within normal limits. No acute osseous abnormalities are seen. Clips are noted within the right upper quadrant, reflecting prior cholecystectomy.  IMPRESSION: No acute cardiopulmonary process seen.   Electronically Signed   By: Roanna Raider M.D.   On: 04/25/2015 06:01        I, Kimmie Berggren E, personally reviewed and evaluated these images and lab results as part of my medical decision-making.  ED ECG REPORT   Date: 04/25/2015  Rate: 92  Rhythm: normal sinus rhythm  QRS Axis: normal  Intervals: normal  ST/T Wave abnormalities: normal  Conduction Disutrbances:none  Narrative Interpretation:   Old EKG Reviewed: unchanged  I have personally reviewed the EKG tracing    MDM   Iv ns. Monitor. Ecg. Labs.  Reviewed nursing notes and prior charts for additional history.   Pt requests something for anxiety/stress.  Will give rx, and encourage close pcp f/u, possible outpatient monitoring if symptoms.  No dysrhythmia on monitor. No cp or sob.  Pt currently stable for d/c.     Return precautions provided.     Cathren Laine, MD 04/25/15 [phone]

## 2015-04-25 NOTE — ED Notes (Signed)
Patient here with complaint of palpitations and left arm pain for approximately 1 week. Currently pain is not present. Patient states it "comes and goes".

## 2015-04-27 ENCOUNTER — Encounter: Payer: Self-pay | Admitting: Internal Medicine

## 2015-04-27 ENCOUNTER — Ambulatory Visit (INDEPENDENT_AMBULATORY_CARE_PROVIDER_SITE_OTHER): Payer: Commercial Managed Care - HMO | Admitting: Internal Medicine

## 2015-04-27 VITALS — BP 120/68 | HR 102 | Wt 145.0 lb

## 2015-04-27 DIAGNOSIS — R002 Palpitations: Secondary | ICD-10-CM

## 2015-04-27 DIAGNOSIS — F411 Generalized anxiety disorder: Secondary | ICD-10-CM

## 2015-04-27 DIAGNOSIS — E1159 Type 2 diabetes mellitus with other circulatory complications: Secondary | ICD-10-CM

## 2015-04-27 MED ORDER — LORAZEPAM 0.5 MG PO TABS: 0.5000 mg | ORAL_TABLET | Freq: Three times a day (TID) | ORAL | Status: DC | PRN

## 2015-04-27 MED ORDER — ESCITALOPRAM OXALATE 10 MG PO TABS: 10.0000 mg | ORAL_TABLET | Freq: Every day | ORAL | Status: DC

## 2015-04-27 NOTE — Assessment & Plan Note (Signed)
Chronic On Metformin 

## 2015-04-27 NOTE — Assessment & Plan Note (Signed)
Increase Lexapro to 10 mg/d Lorazepam prn D/c Xanax  Potential benefits of a long term benzodiazepines  use as well as potential risks  and complications were explained to the patient and were aknowledged.

## 2015-04-27 NOTE — Progress Notes (Signed)
Pre visit review using our clinic review tool, if applicable. No additional management support is needed unless otherwise documented below in the visit note. 

## 2015-04-27 NOTE — Progress Notes (Signed)
Subjective:  Patient ID: Tracy [name], female    DOB: May 12, 1941  Age: 74 y.o. MRN: [id number]  CC: No chief complaint on file.   HPI Tracy [name] presents for ER palpitations and anxiety ER visit a few days ago. Pt was given Xanax - too strong...  Outpatient Prescriptions Prior to Visit  Medication Sig Dispense Refill  . acetaminophen (TYLENOL) 325 MG tablet Take 650 mg by mouth every 6 (six) hours as needed for pain.    . Cholecalciferol (VITAMIN D3) 2000 UNITS capsule Take 1 capsule (2,000 Units total) by mouth daily. 100 capsule 3  . cyanocobalamin 1000 MCG tablet Take 1 tablet (1,000 mcg total) by mouth daily. 100 tablet 3  . donepezil (ARICEPT) 5 MG tablet Take 1 tablet (5 mg total) by mouth at bedtime. 30 tablet 11  . Emollient (CETAPHIL DAILY ADVANCE) LOTN Use bid 473 g 2  . metFORMIN (GLUCOPHAGE) 500 MG tablet Take 1 tablet (500 mg total) by mouth daily with breakfast. 30 tablet 11  . polyethylene glycol powder (GLYCOLAX/MIRALAX) powder Take 17 g by mouth 2 (two) times daily as needed. 527 g 11  . pravastatin (PRAVACHOL) 10 MG tablet Take 1 tablet (10 mg total) by mouth daily at 6 PM. 30 tablet 1  . spironolactone (ALDACTONE) 25 MG tablet Take 1 tablet (25 mg total) by mouth daily. 30 tablet 11  . ALPRAZolam (XANAX) 0.25 MG tablet Take 1 tablet (0.25 mg total) by mouth 3 (three) times daily as needed for anxiety. 20 tablet 0  . escitalopram (LEXAPRO) 5 MG tablet Take 1 tablet (5 mg total) by mouth daily. 30 tablet 5   No facility-administered medications prior to visit.    ROS Review of Systems  Constitutional: Negative for chills, activity change, appetite change, fatigue and unexpected weight change.  HENT: Negative for congestion, mouth sores and sinus pressure.   Eyes: Negative for visual disturbance.  Respiratory: Negative for cough and chest tightness.   Cardiovascular: Positive for palpitations.  Gastrointestinal: Negative for nausea and abdominal pain.    Genitourinary: Negative for frequency, difficulty urinating and vaginal pain.  Musculoskeletal: Negative for back pain and gait problem.  Skin: Negative for pallor and rash.  Neurological: Negative for dizziness, tremors, weakness, numbness and headaches.  Psychiatric/Behavioral: Negative for suicidal ideas, confusion, sleep disturbance and dysphoric mood. The patient is nervous/anxious.     Objective:  BP 120/68 mmHg  Pulse 102  Wt 145 lb (65.772 kg)  SpO2 99%  BP Readings from Last 3 Encounters:  04/27/15 120/68  04/25/15 124/77  03/30/15 110/80    Wt Readings from Last 3 Encounters:  04/27/15 145 lb (65.772 kg)  04/25/15 142 lb (64.411 kg)  03/30/15 142 lb (64.411 kg)    Physical Exam  Constitutional: She appears well-developed. No distress.  HENT:  Head: Normocephalic.  Right Ear: External ear normal.  Left Ear: External ear normal.  Nose: Nose normal.  Mouth/Throat: Oropharynx is clear and moist.  Eyes: Conjunctivae are normal. Pupils are equal, round, and reactive to light. Right eye exhibits no discharge. Left eye exhibits no discharge.  Neck: Normal range of motion. Neck supple. No JVD present. No tracheal deviation present. No thyromegaly present.  Cardiovascular: Normal rate, regular rhythm and normal heart sounds.   Pulmonary/Chest: No stridor. No respiratory distress. She has no wheezes.  Abdominal: Soft. Bowel sounds are normal. She exhibits no distension and no mass. There is no tenderness. There is no rebound and no guarding.  Musculoskeletal: She  exhibits no edema or tenderness.  Lymphadenopathy:    She has no cervical adenopathy.  Neurological: She displays normal reflexes. No cranial nerve deficit. She exhibits normal muscle tone. Coordination abnormal.  Skin: No rash noted. No erythema.  Psychiatric: Her behavior is normal. Judgment and thought content normal.  Cane  Lab Results  Component Value Date   WBC 6.1 04/25/2015   HGB 12.0 04/25/2015    HCT 35.9* 04/25/2015   PLT 352 04/25/2015   GLUCOSE 102* 04/25/2015   CHOL 129 10/27/2014   TRIG 82 10/27/2014   HDL 45 10/27/2014   LDLCALC 68 10/27/2014   ALT 10* 02/05/2015   AST 15 02/05/2015   NA 141 04/25/2015   K 3.9 04/25/2015   CL 105 04/25/2015   CREATININE 0.97 04/25/2015   BUN 19 04/25/2015   CO2 26 04/25/2015   TSH 4.305 10/28/2014   INR 1.12 02/05/2015   HGBA1C 5.5 03/30/2015    Dg Chest 2 View  04/25/2015   CLINICAL DATA:  Acute onset of shortness of breath, generalized chest pain and left arm pain. Initial encounter.  EXAM: CHEST  2 VIEW  COMPARISON:  Chest radiograph performed 02/05/2015  FINDINGS: The lungs are well-aerated and clear. There is no evidence of focal opacification, pleural effusion or pneumothorax.  The heart is normal in size; the mediastinal contour is within normal limits. No acute osseous abnormalities are seen. Clips are noted within the right upper quadrant, reflecting prior cholecystectomy.  IMPRESSION: No acute cardiopulmonary process seen.   Electronically Signed   By: Roanna Raider M.D.   On: 04/25/2015 06:01    Assessment & Plan:   Diagnoses and all orders for this visit:  Generalized anxiety disorder  Palpitations  Other orders -     escitalopram (LEXAPRO) 10 MG tablet; Take 1 tablet (10 mg total) by mouth daily. -     LORazepam (ATIVAN) 0.5 MG tablet; Take 1 tablet (0.5 mg total) by mouth every 8 (eight) hours as needed for anxiety.  I have discontinued Tracy [name]'s escitalopram and ALPRAZolam. I am also having her start on escitalopram and LORazepam. Additionally, I am having her maintain her acetaminophen, polyethylene glycol powder, CETAPHIL DAILY ADVANCE, donepezil, pravastatin, spironolactone, cyanocobalamin, Vitamin D3, and metFORMIN.  Meds ordered this encounter  Medications  . escitalopram (LEXAPRO) 10 MG tablet    Sig: Take 1 tablet (10 mg total) by mouth daily.    Dispense:  30 tablet    Refill:  5  . LORazepam  (ATIVAN) 0.5 MG tablet    Sig: Take 1 tablet (0.5 mg total) by mouth every 8 (eight) hours as needed for anxiety.    Dispense:  60 tablet    Refill:  1     Follow-up: Return in about 2 months (around 06/27/2015) for a follow-up visit.  Sonda Primes, MD

## 2015-04-27 NOTE — Assessment & Plan Note (Signed)
Stress related Treat anxiety

## 2015-04-28 ENCOUNTER — Telehealth: Payer: Self-pay | Admitting: Internal Medicine

## 2015-04-28 NOTE — Telephone Encounter (Signed)
Lomotil - pls call in Stool for Cdiff if not better Thx

## 2015-04-28 NOTE — Telephone Encounter (Signed)
Pt called in said that she is having really bad Diarrhea and wants to know if something can be called in for her?

## 2015-04-29 MED ORDER — POLYETHYLENE GLYCOL 3350 17 GM/SCOOP PO POWD: 17.0000 g | Freq: Two times a day (BID) | ORAL | Status: DC | PRN

## 2015-04-29 NOTE — Telephone Encounter (Signed)
Notified pt with md response. Pt states she wasn't having diarrhea she is constipated. Verified if pt still taking miralax that is on her list pt states no inform her will send updated script for her to take miralax to help with sxs...Raechel Chute

## 2015-05-03 ENCOUNTER — Encounter: Payer: Self-pay | Admitting: Cardiology

## 2015-05-03 ENCOUNTER — Ambulatory Visit (INDEPENDENT_AMBULATORY_CARE_PROVIDER_SITE_OTHER): Payer: Commercial Managed Care - HMO | Admitting: Cardiology

## 2015-05-03 VITALS — BP 122/70 | HR 86 | Ht 65.0 in | Wt 144.0 lb

## 2015-05-03 DIAGNOSIS — I639 Cerebral infarction, unspecified: Secondary | ICD-10-CM | POA: Diagnosis not present

## 2015-05-03 DIAGNOSIS — I10 Essential (primary) hypertension: Secondary | ICD-10-CM

## 2015-05-03 DIAGNOSIS — E785 Hyperlipidemia, unspecified: Secondary | ICD-10-CM | POA: Diagnosis not present

## 2015-05-03 DIAGNOSIS — R002 Palpitations: Secondary | ICD-10-CM

## 2015-05-03 NOTE — Progress Notes (Signed)
Patient ID: Tracy [name], female   DOB: 1940-09-24, 74 y.o.   MRN: [id number]      Cardiology Office Note  Date:  05/03/2015   ID:  Tracy [name], DOB 04-24-1941, MRN [id number]  PCP:  Sonda Primes, MD  Cardiologist:   Lars Masson, MD   Chief complain: Post hospitalization visit. Palpitaions.   History of Present Illness: Tracy [name] is a 74 y.o. female who presents for evaluation of palpitations. HPI Tracy [name] is a 74 y.o. female with history of CVA x 2, 7 years ago, and chronic small vessel disease on Plavix, diabetes, comes in for evaluation of palpitations. She went to the ER on 01/15/2015 with palpitations that woke her up at 5:30 AM and began to experience a "funny feeling around my heart like fluttering". She reports the sensation lasted approximately 2 or 3 minutes and then resolved on its own without any intervention.  The patient states that she "doesn't get this palpitations too often, maybe twice a month". They never last more than few minutes and are not associated with other symptoms.  She has difficulty clarifying what she means. She denies chest pain, rapid heart rate, dizziness, syncope, nausea or vomiting, diaphoresis, shortness of breath, radiation of pain. She denies any discomfort now. No other aggravating or modifying factors. Patient is a nonsmoker, walks with a cane.  05/03/2015 - 14 day ecardio monitor didn't show any arrhythmias, however the patient didn't feel palpitations at the time. She continues to have on and off palpitations with some dizziness but no syncope.  Past Medical History  Diagnosis Date  . Diabetes mellitus without complication   . Hypertension   . Stroke   . Acute cholecystitis 08/10/2013    Lap chole on 08/13/13   . Depression   . Anxiety   . Restless leg syndrome     Past Surgical History  Procedure Laterality Date  . Ercp N/A 08/12/2013    Procedure: ENDOSCOPIC RETROGRADE CHOLANGIOPANCREATOGRAPHY (ERCP);   Surgeon: Theda Belfast, MD;  Location: [facility] ENDOSCOPY;  Service: Endoscopy;  Laterality: N/A;  . Sphincterotomy  08/12/2013    Procedure: SPHINCTEROTOMY;  Surgeon: Theda Belfast, MD;  Location: [facility] ENDOSCOPY;  Service: Endoscopy;;  . Cholecystectomy N/A 08/13/2013    Procedure: LAPAROSCOPIC CHOLECYSTECTOMY ;  Surgeon: Currie Paris, MD;  Location: [facility] OR;  Service: General;  Laterality: N/A;  . Incisional hernia repair N/A 08/20/2013    Procedure: HERNIA REPAIR INCISIONAL UMBILICAL;  Surgeon: Velora Heckler, MD;  Location: [facility] OR;  Service: General;  Laterality: N/A;     Current Outpatient Prescriptions  Medication Sig Dispense Refill  . acetaminophen (TYLENOL) 325 MG tablet Take 650 mg by mouth every 6 (six) hours as needed for pain.    Marland Kitchen ALPRAZolam (XANAX) 0.25 MG tablet Take 1 tablet by mouth daily as needed.    . cyanocobalamin 1000 MCG tablet Take 1 tablet (1,000 mcg total) by mouth daily. 100 tablet 3  . donepezil (ARICEPT) 5 MG tablet Take 1 tablet (5 mg total) by mouth at bedtime. 30 tablet 11  . Emollient (CETAPHIL DAILY ADVANCE) LOTN Use bid 473 g 2  . escitalopram (LEXAPRO) 10 MG tablet Take 1 tablet (10 mg total) by mouth daily. 30 tablet 5  . metFORMIN (GLUCOPHAGE) 500 MG tablet Take 1 tablet (500 mg total) by mouth daily with breakfast. 30 tablet 11  . polyethylene glycol powder (GLYCOLAX/MIRALAX) powder Take 17 g by mouth 2 (two) times daily as needed.  527 g 11  . spironolactone (ALDACTONE) 25 MG tablet Take 1 tablet (25 mg total) by mouth daily. 30 tablet 11   No current facility-administered medications for this visit.    Allergies:   Wellbutrin    Social History:  The patient  reports that she has never smoked. She does not have any smokeless tobacco history on file. She reports that she does not drink alcohol or use illicit drugs.   Family History:  The patient's family history includes Stroke in her mother.    ROS:  Please see the history of present illness.    Otherwise, review of systems are positive for none.   All other systems are reviewed and negative.    PHYSICAL EXAM: VS:  BP 122/70 mmHg  Pulse 86  Ht 5\' 5"  (1.651 m)  Wt 144 lb (65.318 kg)  BMI 23.96 kg/m2  SpO2 99% , BMI Body mass index is 23.96 kg/(m^2). GEN: Well nourished, well developed, in no acute distress HEENT: normal Neck: no JVD, carotid bruits, or masses Cardiac: RRR; no murmurs, rubs, or gallops,no edema  Respiratory:  clear to auscultation bilaterally, normal work of breathing GI: soft, nontender, nondistended, + BS MS: no deformity or atrophy Skin: warm and dry, no rash Neuro:  Strength and sensation are intact Psych: euthymic mood, full affect   EKG:  EKG is not ordered today. The ekg ordered today demonstrates SR, normal ECG (from ER on 01/15/15).   Recent Labs: 10/28/2014: TSH 4.305 02/05/2015: ALT 10* 04/25/2015: BUN 19; Creatinine, Ser 0.97; Hemoglobin 12.0; Platelets 352; Potassium 3.9; Sodium 141    Lipid Panel    Component Value Date/Time   CHOL 129 10/27/2014 0546   TRIG 82 10/27/2014 0546   HDL 45 10/27/2014 0546   CHOLHDL 2.9 10/27/2014 0546   VLDL 16 10/27/2014 0546   LDLCALC 68 10/27/2014 0546   Wt Readings from Last 3 Encounters:  05/03/15 144 lb (65.318 kg)  04/27/15 145 lb (65.772 kg)  04/25/15 142 lb (64.411 kg)    Other studies Reviewed: Additional studies/ records that were reviewed today include: ECG. Review of the above records demonstrates: SR, normal ECG.  TTE: 10/27/2014 Left ventricle: The cavity size was normal. Wall thickness was increased in a pattern of moderate LVH. Systolic function was normal. The estimated ejection fraction was in the range of 60% to 65%. Wall motion was normal; there were no regional wall motion abnormalities. Doppler parameters are consistent with abnormal left ventricular relaxation (grade 1 diastolic dysfunction). LA - normal size  ECG: SR, normal ECG     ASSESSMENT AND  PLAN:  1.  Palpitations - the concern is possible atrial fibrillation considering h/o two CVAs in the past. Two week e-cardio monitor didn't identify a-fib but she was asymptomatic at the time of monitoring. For now continue ASA and Plavix. We will refer to EP for loop recorder.   2. HTN - controlled  3. HLP - on pravastatin   Disposition:   FU with Hillis Range.   Signed, Lars Masson, MD  05/03/2015 3:52 PM    [facility] [address] [place], [place], [place]  [address] Phone: [phone]; Fax: [phone]

## 2015-05-03 NOTE — Patient Instructions (Signed)
Medication Instructions:  Your physician recommends that you continue on your current medications as directed. Please refer to the Current Medication list given to you today.   Labwork: None   Testing/Procedures: None   Follow-Up: You have been referred to Dr.Allred (for loop recorder implantation consideration)   Your physician recommends that you schedule a follow-up appointment as needed with Dr.Nelson   Any Other Special Instructions Will Be Listed Below (If Applicable).

## 2015-05-05 ENCOUNTER — Institutional Professional Consult (permissible substitution): Payer: Commercial Managed Care - HMO | Admitting: Internal Medicine

## 2015-05-06 ENCOUNTER — Telehealth: Payer: Self-pay | Admitting: *Deleted

## 2015-05-06 NOTE — Telephone Encounter (Signed)
Receive call pt is requesting refill on her alprazolam.../lmb 

## 2015-05-07 MED ORDER — ALPRAZOLAM 0.25 MG PO TABS: 0.2500 mg | ORAL_TABLET | Freq: Every day | ORAL | Status: DC | PRN

## 2015-05-07 NOTE — Telephone Encounter (Signed)
Pls advise on msg below.../lmb 

## 2015-05-07 NOTE — Telephone Encounter (Signed)
OK to fill this prescription with additional refills x3 Thank you!  

## 2015-05-07 NOTE — Telephone Encounter (Signed)
[facility].../lmb

## 2015-05-10 ENCOUNTER — Institutional Professional Consult (permissible substitution): Payer: Commercial Managed Care - HMO | Admitting: Internal Medicine

## 2015-05-16 ENCOUNTER — Other Ambulatory Visit: Payer: Self-pay | Admitting: Internal Medicine

## 2015-05-23 ENCOUNTER — Other Ambulatory Visit: Payer: Self-pay | Admitting: Internal Medicine

## 2015-05-25 ENCOUNTER — Telehealth: Payer: Self-pay | Admitting: Internal Medicine

## 2015-05-25 NOTE — Telephone Encounter (Signed)
Patient's son would like for Dr. Posey Rea to call him concerning the Donepezil his mother is taking for Dementia. Patient said the doctor told her to take 2 pills a day but the prescription is only 30 pills and she has run out.  Also the instructions on the prescription bottle says take one pill a day.  The son would like to know how his mother is suppose to take the meds. Please advise.

## 2015-05-26 NOTE — Telephone Encounter (Signed)
Sorry - it was a misunderstanding: take 5 mg/d; we may double the dose next time I see her. Rx is correct. Thx

## 2015-05-31 ENCOUNTER — Ambulatory Visit (INDEPENDENT_AMBULATORY_CARE_PROVIDER_SITE_OTHER): Payer: Commercial Managed Care - HMO | Admitting: Internal Medicine

## 2015-05-31 ENCOUNTER — Encounter: Payer: Self-pay | Admitting: Internal Medicine

## 2015-05-31 VITALS — BP 124/70 | HR 72 | Ht 65.0 in | Wt 156.0 lb

## 2015-05-31 DIAGNOSIS — I639 Cerebral infarction, unspecified: Secondary | ICD-10-CM | POA: Diagnosis not present

## 2015-05-31 NOTE — Progress Notes (Signed)
  ELECTROPHYSIOLOGY CONSULT NOTE  Patient ID: Tracy [name], MRN: [id number], DOB/AGE: 05/18/1941 74 y.o. Admit date: (Not on file) Date of Consult: 05/31/2015  Primary Physician: Alex Plotnikov, MD Primary Cardiologist: KN  Chief Complaint: stroke for loop recorder    HPI Tracy [name] is a 74 y.o. female with history of CVA x 2, 7 years ago, and chronic small vessel disease on Plavix, diabetes and hx of  palpitations.  She reports the sensation lasted approximately 2 or 3 minutes and then resolved on its own without any intervention.  she "doesn't get this palpitations too often, maybe twice a month". They never last more than few minutes and are not associated with other symptoms.     She denies chest pain, rapid heart rate, dizziness, syncope, nausea or vomiting, diaphoresis, shortness of breath, radiation of pain. She denies any discomfort now. No other aggravating or modifying factors.  Patient is a nonsmoker, walks with a cane.   Past Medical History  Diagnosis Date  . Diabetes mellitus without complication   . Hypertension   . Stroke   . Acute cholecystitis 08/10/2013    Lap chole on 08/13/13   . Depression   . Anxiety   . Restless leg syndrome       Surgical History:  Past Surgical History  Procedure Laterality Date  . Ercp N/A 08/12/2013    Procedure: ENDOSCOPIC RETROGRADE CHOLANGIOPANCREATOGRAPHY (ERCP);  Surgeon: Patrick D Hung, MD;  Location: [facility] ENDOSCOPY;  Service: Endoscopy;  Laterality: N/A;  . Sphincterotomy  08/12/2013    Procedure: SPHINCTEROTOMY;  Surgeon: Patrick D Hung, MD;  Location: [facility] ENDOSCOPY;  Service: Endoscopy;;  . Cholecystectomy N/A 08/13/2013    Procedure: LAPAROSCOPIC CHOLECYSTECTOMY ;  Surgeon: Christian J Streck, MD;  Location: [facility] OR;  Service: General;  Laterality: N/A;  . Incisional hernia repair N/A 08/20/2013    Procedure: HERNIA REPAIR INCISIONAL UMBILICAL;  Surgeon: Todd M Gerkin, MD;  Location: [facility] OR;  Service: General;   Laterality: N/A;     Home Meds: Prior to Admission medications   Medication Sig Start Date End Date Taking? Authorizing Provider  acetaminophen (TYLENOL) 325 MG tablet Take 650 mg by mouth every 6 (six) hours as needed for pain.   Yes Historical Provider, MD  ALPRAZolam (XANAX) 0.25 MG tablet Take 1 tablet (0.25 mg total) by mouth daily as needed. 05/07/15  Yes Aleksei Plotnikov V, MD  cholecalciferol (VITAMIN D) 1000 UNITS tablet Take 1,000 Units by mouth daily.   Yes Historical Provider, MD  cyanocobalamin 1000 MCG tablet Take 1 tablet (1,000 mcg total) by mouth daily. 02/22/15  Yes Aleksei Plotnikov V, MD  donepezil (ARICEPT) 5 MG tablet Take 1 tablet (5 mg total) by mouth at bedtime. 02/22/15  Yes Aleksei Plotnikov V, MD  Emollient (CETAPHIL DAILY ADVANCE) LOTN Use bid 11/27/14  Yes Aleksei Plotnikov V, MD  escitalopram (LEXAPRO) 5 MG tablet TAKE 1 TABLET BY MOUTH EVERY DAY 05/24/15  Yes Aleksei Plotnikov V, MD  metFORMIN (GLUCOPHAGE) 500 MG tablet Take 1 tablet (500 mg total) by mouth daily with breakfast. 03/30/15  Yes Aleksei Plotnikov V, MD  polyethylene glycol powder (GLYCOLAX/MIRALAX) powder Take 17 g by mouth 2 (two) times daily as needed. 04/29/15  Yes Aleksei Plotnikov V, MD  spironolactone (ALDACTONE) 25 MG tablet Take 1 tablet (25 mg total) by mouth daily. 02/22/15  Yes Aleksei Plotnikov V, MD      Allergies:  Allergies  Allergen Reactions  . Wellbutrin [Bupropion] Other (See Comments)      Wt loss    Social History   Social History  . Marital Status: Married    Spouse Name: N/A  . Number of Children: 7  . Years of Education: 10   Occupational History  . retired     textiles   Social History Main Topics  . Smoking status: Never Smoker   . Smokeless tobacco: Not on file  . Alcohol Use: No  . Drug Use: No  . Sexual Activity: Not on file   Other Topics Concern  . Not on file   Social History Narrative   Married, 7 children, lives with son   Retired from textile  work   right handed   Caffeine use -  1 soda daily, tea twice weekly     Family History  Problem Relation Age of Onset  . Stroke Mother      ROS:  Please see the history of present illness.    All other systems reviewed and negative.    Physical Exam:   Blood pressure 124/70, pulse 72, height 5' 5" (1.651 m), weight 156 lb (70.761 kg). General: Well developed, well nourished female in no acute distress. Head: Normocephalic, atraumatic, sclera non-icteric, no xanthomas, nares are without discharge. EENT: dysconjugte gaze Lymph Nodes:  none Back: without scoliosis/kyphosis *, no CVA tendersness Neck: Negative for carotid bruits. JVD not elevated. Lungs: Clear bilaterally to auscultation without wheezes, rales, or rhonchi. Breathing is unlabored. Heart: RRR with S1 S2. No*  murmur , rubs, or gallops appreciated. Abdomen: Soft, non-tender, non-distended with normoactive bowel sounds. No hepatomegaly. No rebound/guarding. No obvious abdominal masses. Msk:  Strength and tone appear normal fo edema.  Distal pedal pulses are 2+ and equal bilaterally. Skin: Warm and Dry Neuro: Alert and oriented X 3. CN III-XII intact Grossly normal sensory and motor function . Psych:  Responds to questions appropriately with a normal affect.      Labs: Cardiac Enzymes No results for input(s): CKTOTAL, CKMB, TROPONINI in the last 72 hours. CBC Lab Results  Component Value Date   WBC 6.1 04/25/2015   HGB 12.0 04/25/2015   HCT 35.9* 04/25/2015   MCV 93.2 04/25/2015   PLT 352 04/25/2015   PROTIME: No results for input(s): LABPROT, INR in the last 72 hours. Chemistry No results for input(s): NA, K, CL, CO2, BUN, CREATININE, CALCIUM, PROT, BILITOT, ALKPHOS, ALT, AST, GLUCOSE in the last 168 hours.  Invalid input(s): LABALBU Lipids Lab Results  Component Value Date   CHOL 129 10/27/2014   HDL 45 10/27/2014   LDLCALC 68 10/27/2014   TRIG 82 10/27/2014   BNP PRO B NATRIURETIC PEPTIDE (BNP)    Date/Time Value Ref Range Status  07/20/2008 10:24 AM <30.0 HEMOLYZED SPECIMEN, RESULTS MAY BE AFFECTED  Final   Thyroid Function Tests: No results for input(s): TSH, T4TOTAL, T3FREE, THYROIDAB in the last 72 hours.  Invalid input(s): FREET3    Miscellaneous Lab Results  Component Value Date   DDIMER * 07/20/2008    0.72        AT THE INHOUSE ESTABLISHED CUTOFF VALUE OF 0.48 ug/mL FEU, THIS ASSAY HAS BEEN DOCUMENTED IN THE LITERATURE TO HAVE    Radiology/Studies:  No results found.  EKG: nsr 72  18/08/38 rsr'   Assessment and Plan:  Stroke  Hypertension     With recurrent xtrokes and tachypalpitations it is appropriate to pursue loop recorder insertion for looking atrial fibrillation as the cause of her strokes  reveiwed with son and pt      They are agreeable to proceeding     Tatum Corl    

## 2015-05-31 NOTE — Patient Instructions (Signed)
Medication Instructions: - no changes  Labwork: - none  Procedures/Testing: - Your physician has recommended that you have an Implantable Loop Recorder [facility]) monitor inserted - This is scheduled for Wednesday 06/02/15 at 8:30 am with [name]- arrive at 7:00 am [facility], Entrance "A."- you will then be directed Short Stay. - You may have a light breakfast the morning of the procedure - You may take your medications as usual.   Follow-Up: - Your physician recommends that you schedule a follow-up appointment in: approximately 10 days (from 06/02/15) with the device clinic for a wound check.  Any Additional Special Instructions Will Be Listed Below (If Applicable). - none

## 2015-06-02 ENCOUNTER — Encounter (HOSPITAL_COMMUNITY)
Admission: RE | Disposition: A | Discharge status: Home / Self Care | Payer: Self-pay | Source: Ambulatory Visit | Attending: Internal Medicine

## 2015-06-02 ENCOUNTER — Ambulatory Visit (HOSPITAL_COMMUNITY)
Admission: RE | Admit: 2015-06-02 | Discharge: 2015-06-02 | Disposition: A | Discharge status: Home / Self Care | Payer: Commercial Managed Care - HMO | Source: Ambulatory Visit | Attending: Internal Medicine | Admitting: Internal Medicine

## 2015-06-02 DIAGNOSIS — Z8673 Personal history of transient ischemic attack (TIA), and cerebral infarction without residual deficits: Secondary | ICD-10-CM | POA: Insufficient documentation

## 2015-06-02 DIAGNOSIS — I639 Cerebral infarction, unspecified: Secondary | ICD-10-CM | POA: Diagnosis not present

## 2015-06-02 DIAGNOSIS — F419 Anxiety disorder, unspecified: Secondary | ICD-10-CM | POA: Insufficient documentation

## 2015-06-02 DIAGNOSIS — R002 Palpitations: Secondary | ICD-10-CM | POA: Diagnosis not present

## 2015-06-02 DIAGNOSIS — F329 Major depressive disorder, single episode, unspecified: Secondary | ICD-10-CM | POA: Diagnosis not present

## 2015-06-02 DIAGNOSIS — E119 Type 2 diabetes mellitus without complications: Secondary | ICD-10-CM | POA: Insufficient documentation

## 2015-06-02 DIAGNOSIS — G2581 Restless legs syndrome: Secondary | ICD-10-CM | POA: Diagnosis not present

## 2015-06-02 DIAGNOSIS — I10 Essential (primary) hypertension: Secondary | ICD-10-CM | POA: Insufficient documentation

## 2015-06-02 HISTORY — PX: EP IMPLANTABLE DEVICE: SHX172B

## 2015-06-02 LAB — GLUCOSE, CAPILLARY: Glucose-Capillary: 85 mg/dL (ref 65–99)

## 2015-06-02 SURGERY — LOOP RECORDER INSERTION

## 2015-06-02 MED ORDER — LIDOCAINE HCL (PF) 1 % IJ SOLN
INTRAMUSCULAR | Status: DC | PRN
  Administered 2015-06-02: 5 mL

## 2015-06-02 MED ORDER — LIDOCAINE-EPINEPHRINE 1 %-1:100000 IJ SOLN
INTRAMUSCULAR | Status: AC
  Filled 2015-06-02: qty 1

## 2015-06-02 SURGICAL SUPPLY — 2 items
LOOP REVEAL LINQSYS (Prosthesis & Implant Heart) ×3 IMPLANT
PACK LOOP INSERTION (CUSTOM PROCEDURE TRAY) ×3 IMPLANT

## 2015-06-02 NOTE — Interval H&P Note (Signed)
History and Physical Interval Note:  06/02/2015 7:57 AM  Tracy [name]  has presented today for surgery, with the diagnosis of stroke  The various methods of treatment have been discussed with the patient and family. After consideration of risks, benefits and other options for treatment, the patient has consented to  Procedure(s): Loop Recorder Insertion (N/A) as a surgical intervention .  The patient's history has been reviewed, patient examined, no change in status, stable for surgery.  I have reviewed the patient's chart and labs.  Questions were answered to the patient's satisfaction.     Sherryl Manges

## 2015-06-02 NOTE — H&P (View-Only) (Signed)
ELECTROPHYSIOLOGY CONSULT NOTE  Patient ID: Tracy [name], MRN: [id number], DOB/AGE: Oct 12, 1940 74 y.o. Admit date: (Not on file) Date of Consult: 05/31/2015  Primary Physician: Sonda Primes, MD Primary Cardiologist: KN  Chief Complaint: stroke for loop recorder    HPI Tracy [name] is a 74 y.o. female with history of CVA x 2, 7 years ago, and chronic small vessel disease on Plavix, diabetes and hx of  palpitations.  She reports the sensation lasted approximately 2 or 3 minutes and then resolved on its own without any intervention.  she "doesn't get this palpitations too often, maybe twice a month". They never last more than few minutes and are not associated with other symptoms.     She denies chest pain, rapid heart rate, dizziness, syncope, nausea or vomiting, diaphoresis, shortness of breath, radiation of pain. She denies any discomfort now. No other aggravating or modifying factors.  Patient is a nonsmoker, walks with a cane.   Past Medical History  Diagnosis Date  . Diabetes mellitus without complication   . Hypertension   . Stroke   . Acute cholecystitis 08/10/2013    Lap chole on 08/13/13   . Depression   . Anxiety   . Restless leg syndrome       Surgical History:  Past Surgical History  Procedure Laterality Date  . Ercp N/A 08/12/2013    Procedure: ENDOSCOPIC RETROGRADE CHOLANGIOPANCREATOGRAPHY (ERCP);  Surgeon: Theda Belfast, MD;  Location: [facility] ENDOSCOPY;  Service: Endoscopy;  Laterality: N/A;  . Sphincterotomy  08/12/2013    Procedure: SPHINCTEROTOMY;  Surgeon: Theda Belfast, MD;  Location: [facility] ENDOSCOPY;  Service: Endoscopy;;  . Cholecystectomy N/A 08/13/2013    Procedure: LAPAROSCOPIC CHOLECYSTECTOMY ;  Surgeon: Currie Paris, MD;  Location: [facility] OR;  Service: General;  Laterality: N/A;  . Incisional hernia repair N/A 08/20/2013    Procedure: HERNIA REPAIR INCISIONAL UMBILICAL;  Surgeon: Velora Heckler, MD;  Location: [facility] OR;  Service: General;   Laterality: N/A;     Home Meds: Prior to Admission medications   Medication Sig Start Date End Date Taking? Authorizing Provider  acetaminophen (TYLENOL) 325 MG tablet Take 650 mg by mouth every 6 (six) hours as needed for pain.   Yes Historical Provider, MD  ALPRAZolam (XANAX) 0.25 MG tablet Take 1 tablet (0.25 mg total) by mouth daily as needed. 05/07/15  Yes Aleksei Plotnikov V, MD  cholecalciferol (VITAMIN D) 1000 UNITS tablet Take 1,000 Units by mouth daily.   Yes Historical Provider, MD  cyanocobalamin 1000 MCG tablet Take 1 tablet (1,000 mcg total) by mouth daily. 02/22/15  Yes Aleksei Plotnikov V, MD  donepezil (ARICEPT) 5 MG tablet Take 1 tablet (5 mg total) by mouth at bedtime. 02/22/15  Yes Aleksei Plotnikov V, MD  Emollient (CETAPHIL DAILY ADVANCE) LOTN Use bid 11/27/14  Yes Aleksei Plotnikov V, MD  escitalopram (LEXAPRO) 5 MG tablet TAKE 1 TABLET BY MOUTH EVERY DAY 05/24/15  Yes Aleksei Plotnikov V, MD  metFORMIN (GLUCOPHAGE) 500 MG tablet Take 1 tablet (500 mg total) by mouth daily with breakfast. 03/30/15  Yes Aleksei Plotnikov V, MD  polyethylene glycol powder (GLYCOLAX/MIRALAX) powder Take 17 g by mouth 2 (two) times daily as needed. 04/29/15  Yes Aleksei Plotnikov V, MD  spironolactone (ALDACTONE) 25 MG tablet Take 1 tablet (25 mg total) by mouth daily. 02/22/15  Yes Tresa Garter, MD      Allergies:  Allergies  Allergen Reactions  . Wellbutrin [Bupropion] Other (See Comments)  Wt loss    Social History   Social History  . Marital Status: Married    Spouse Name: N/A  . Number of Children: 7  . Years of Education: 10   Occupational History  . retired     Designer, fashion/clothing   Social History Main Topics  . Smoking status: Never Smoker   . Smokeless tobacco: Not on file  . Alcohol Use: No  . Drug Use: No  . Sexual Activity: Not on file   Other Topics Concern  . Not on file   Social History Narrative   Married, 7 children, lives with son   Retired from [employer]  work   right handed   Caffeine use -  1 soda daily, tea twice weekly     Family History  Problem Relation Age of Onset  . Stroke Mother      ROS:  Please see the history of present illness.    All other systems reviewed and negative.    Physical Exam:   Blood pressure 124/70, pulse 72, height  (1.651 m), weight 156 lb (70.761 kg). General: Well developed, well nourished female in no acute distress. Head: Normocephalic, atraumatic, sclera non-icteric, no xanthomas, nares are without discharge. EENT: dysconjugte gaze Lymph Nodes:  none Back: without scoliosis/kyphosis *, no CVA tendersness Neck: Negative for carotid bruits. JVD not elevated. Lungs: Clear bilaterally to auscultation without wheezes, rales, or rhonchi. Breathing is unlabored. Heart: RRR with S1 S2. No*  murmur , rubs, or gallops appreciated. Abdomen: Soft, non-tender, non-distended with normoactive bowel sounds. No hepatomegaly. No rebound/guarding. No obvious abdominal masses. Msk:  Strength and tone appear normal fo edema.  Distal pedal pulses are 2+ and equal bilaterally. Skin: Warm and Dry Neuro: Alert and oriented X 3. CN III-XII intact Grossly normal sensory and motor function . Psych:  Responds to questions appropriately with a normal affect.      Labs: Cardiac Enzymes No results for input(s): CKTOTAL, CKMB, TROPONINI in the last 72 hours. CBC Lab Results  Component Value Date   WBC 6.1 04/25/2015   HGB 12.0 04/25/2015   HCT 35.9* 04/25/2015   MCV 93.2 04/25/2015   PLT 352 04/25/2015   PROTIME: No results for input(s): LABPROT, INR in the last 72 hours. Chemistry No results for input(s): NA, K, CL, CO2, BUN, CREATININE, CALCIUM, PROT, BILITOT, ALKPHOS, ALT, AST, GLUCOSE in the last 168 hours.  Invalid input(s): LABALBU Lipids Lab Results  Component Value Date   CHOL 129 10/27/2014   HDL 45 10/27/2014   LDLCALC 68 10/27/2014   TRIG 82 10/27/2014   BNP PRO B NATRIURETIC PEPTIDE (BNP)    Date/Time Value Ref Range Status  07/20/2008 10:24 AM <30.0 HEMOLYZED SPECIMEN, RESULTS MAY BE AFFECTED  Final   Thyroid Function Tests: No results for input(s): TSH, T4TOTAL, T3FREE, THYROIDAB in the last 72 hours.  Invalid input(s): FREET3    Miscellaneous Lab Results  Component Value Date   DDIMER * 07/20/2008    0.72        AT THE INHOUSE ESTABLISHED CUTOFF VALUE OF 0.48 ug/mL FEU, THIS ASSAY HAS BEEN DOCUMENTED IN THE LITERATURE TO HAVE    Radiology/Studies:  No results found.  EKG: nsr 72  18/08/38 rsr'   Assessment and Plan:  Stroke  Hypertension     With recurrent xtrokes and tachypalpitations it is appropriate to pursue loop recorder insertion for looking atrial fibrillation as the cause of her strokes  reveiwed with son and pt  They are agreeable to proceeding     Sherryl Manges

## 2015-06-03 ENCOUNTER — Encounter (HOSPITAL_COMMUNITY): Payer: Self-pay | Admitting: Internal Medicine

## 2015-06-10 NOTE — Telephone Encounter (Signed)
Pt informed

## 2015-06-14 ENCOUNTER — Ambulatory Visit (INDEPENDENT_AMBULATORY_CARE_PROVIDER_SITE_OTHER): Payer: Commercial Managed Care - HMO | Admitting: *Deleted

## 2015-06-14 DIAGNOSIS — Z8673 Personal history of transient ischemic attack (TIA), and cerebral infarction without residual deficits: Secondary | ICD-10-CM

## 2015-06-14 LAB — CUP PACEART INCLINIC DEVICE CHECK
MDC IDC SESS DTM: 20161003142259
MDC IDC SET ZONE DETECTION INTERVAL: 380 ms
Zone Setting Detection Interval: 2000 ms
Zone Setting Detection Interval: 3000 ms

## 2015-06-14 NOTE — Progress Notes (Signed)
ILR Wound check appointment. Steri-strips removed. Wound without redness or edema. Incision edges approximated, wound well healed. Normal device function. Pt with 0 tachy episodes; 0 brady episodes; 1 asystole---undersensing. Carelink summary reports QMO & ROV w/ SK in 23yr.

## 2015-06-16 ENCOUNTER — Telehealth: Payer: Self-pay | Admitting: *Deleted

## 2015-06-16 NOTE — Telephone Encounter (Signed)
Pt called stating when she takes ASA EC 81 mg, at times it causes her heart to flutter. She states she is taking it in the morning after eating a small meal. Please advise.

## 2015-06-16 NOTE — Telephone Encounter (Signed)
Called pt spoke with daughter [name]) gave md response...Raechel Chute

## 2015-06-16 NOTE — Telephone Encounter (Signed)
Stop ASA x 1 week, then re-start at supper Thx

## 2015-06-19 ENCOUNTER — Other Ambulatory Visit: Payer: Self-pay | Admitting: Internal Medicine

## 2015-06-29 ENCOUNTER — Ambulatory Visit: Payer: Commercial Managed Care - HMO | Admitting: Internal Medicine

## 2015-07-02 ENCOUNTER — Ambulatory Visit (INDEPENDENT_AMBULATORY_CARE_PROVIDER_SITE_OTHER): Payer: Commercial Managed Care - HMO | Admitting: *Deleted

## 2015-07-02 ENCOUNTER — Encounter: Payer: Self-pay | Admitting: Internal Medicine

## 2015-07-02 DIAGNOSIS — I6389 Other cerebral infarction: Secondary | ICD-10-CM

## 2015-07-02 DIAGNOSIS — I638 Other cerebral infarction: Secondary | ICD-10-CM

## 2015-07-05 NOTE — Progress Notes (Signed)
Loop recorder 

## 2015-07-14 ENCOUNTER — Telehealth: Payer: Self-pay | Admitting: Internal Medicine

## 2015-07-14 NOTE — Telephone Encounter (Signed)
[name] - Client TELEPHONE ADVICE RECORD   [facility]     Patient Name: Tracy [name] Initial Comment Caller states he needs to know if she can take Aspirin  DOB: 01/06/1941      Nurse Assessment  Nurse: Harlon FlorWhitaker, RN, Darl PikesSusan Date/Time (Eastern Time): 07/14/2015 5:08:38 PM  Confirm and document reason for call. If symptomatic, describe symptoms. ---Caller states he needs to know if she can take Aspirin . Her MD told her to take low dose ASA 3 months ago. So the pt is not taking them at this time.   Has the patient traveled out of the country within the last 30 days? ---No   Does the patient have any new or worsening symptoms? ---No   Please document clinical information provided and list any resource used. ---RN reviewed medication list . to answer the question about her Enteric coated 81 mg Aspirin. Caller verbalized understanding.     Guidelines     Guideline Title Affirmed Question Affirmed Notes        Final Disposition User   Clinical Call Camanche North ShoreWhitaker, RN, Darl PikesSusan

## 2015-07-14 NOTE — Telephone Encounter (Signed)
Patient took aspirin this morning and she is wondering if that was ok. Can you please give her a call asap Her phone number is [phone]

## 2015-07-15 NOTE — Telephone Encounter (Signed)
Called pt- # is busy will try again later.

## 2015-07-15 NOTE — Telephone Encounter (Signed)
Ok Thx 

## 2015-07-15 NOTE — Telephone Encounter (Signed)
Called pt again- no answer. Unable to leave vm.

## 2015-07-16 NOTE — Telephone Encounter (Signed)
Called pt- # is busy.  

## 2015-07-19 ENCOUNTER — Ambulatory Visit (INDEPENDENT_AMBULATORY_CARE_PROVIDER_SITE_OTHER): Payer: Commercial Managed Care - HMO | Admitting: Internal Medicine

## 2015-07-19 ENCOUNTER — Encounter: Payer: Self-pay | Admitting: Internal Medicine

## 2015-07-19 VITALS — BP 100/60 | HR 103 | Wt 148.0 lb

## 2015-07-19 DIAGNOSIS — I10 Essential (primary) hypertension: Secondary | ICD-10-CM | POA: Diagnosis not present

## 2015-07-19 DIAGNOSIS — E1159 Type 2 diabetes mellitus with other circulatory complications: Secondary | ICD-10-CM

## 2015-07-19 DIAGNOSIS — R634 Abnormal weight loss: Secondary | ICD-10-CM | POA: Diagnosis not present

## 2015-07-19 DIAGNOSIS — I6310 Cerebral infarction due to embolism of unspecified precerebral artery: Secondary | ICD-10-CM

## 2015-07-19 MED ORDER — LUBIPROSTONE 24 MCG PO CAPS: ORAL_CAPSULE | ORAL | Status: DC

## 2015-07-19 NOTE — Assessment & Plan Note (Signed)
ASA qd 

## 2015-07-19 NOTE — Progress Notes (Signed)
Pre visit review using our clinic review tool, if applicable. No additional management support is needed unless otherwise documented below in the visit note. 

## 2015-07-19 NOTE — Assessment & Plan Note (Signed)
Chronic  On Metformin Labs

## 2015-07-19 NOTE — Assessment & Plan Note (Signed)
Chronic On Spironolactone 

## 2015-07-19 NOTE — Assessment & Plan Note (Signed)
Wt Readings from Last 3 Encounters:  07/19/15 148 lb (67.132 kg)  06/02/15 152 lb (68.947 kg)  05/31/15 156 lb (70.761 kg)

## 2015-07-19 NOTE — Telephone Encounter (Signed)
Pt was seen by PCP today. Closing phone note.

## 2015-07-19 NOTE — Progress Notes (Signed)
Subjective:  Patient ID: Tracy [name], female    DOB: 09/09/1941  Age: 74 y.o. MRN: [id number]  CC: No chief complaint on file.   HPI Tracy [name] presents for HTN, CVA, DM f/u  Outpatient Prescriptions Prior to Visit  Medication Sig Dispense Refill  . acetaminophen (TYLENOL) 325 MG tablet Take 650 mg by mouth every 6 (six) hours as needed for pain.    . cholecalciferol (VITAMIN D) 1000 UNITS tablet Take 1,000 Units by mouth daily.    . CVS VITAMIN B12 1000 MCG tablet TAKE 1 TABLET (1,000 MCG TOTAL) BY MOUTH DAILY. 30 tablet 5  . donepezil (ARICEPT) 5 MG tablet Take 1 tablet (5 mg total) by mouth at bedtime. 30 tablet 11  . Emollient (CETAPHIL DAILY ADVANCE) LOTN Use bid (Patient taking differently: Apply 1 application topically 2 (two) times daily. Use bid) 473 g 2  . escitalopram (LEXAPRO) 5 MG tablet TAKE 1 TABLET BY MOUTH EVERY DAY 30 tablet 5  . metFORMIN (GLUCOPHAGE) 500 MG tablet Take 1 tablet (500 mg total) by mouth daily with breakfast. 30 tablet 11  . spironolactone (ALDACTONE) 25 MG tablet Take 1 tablet (25 mg total) by mouth daily. 30 tablet 11  . cyanocobalamin 1000 MCG tablet Take 1 tablet (1,000 mcg total) by mouth daily. 100 tablet 3  . polyethylene glycol powder (GLYCOLAX/MIRALAX) powder Take 17 g by mouth 2 (two) times daily as needed. 527 g 11  . aspirin EC 81 MG tablet Take 81 mg by mouth daily.     No facility-administered medications prior to visit.    ROS Review of Systems  Constitutional: Negative for chills, activity change, appetite change, fatigue and unexpected weight change.  HENT: Negative for congestion, mouth sores and sinus pressure.   Eyes: Negative for visual disturbance.  Respiratory: Negative for cough and chest tightness.   Gastrointestinal: Negative for nausea and abdominal pain.  Genitourinary: Positive for frequency. Negative for urgency, difficulty urinating and vaginal pain.  Musculoskeletal: Positive for gait problem. Negative  for back pain.  Skin: Negative for pallor and rash.  Neurological: Positive for dizziness and weakness. Negative for tremors, numbness and headaches.  Psychiatric/Behavioral: Positive for decreased concentration. Negative for confusion and sleep disturbance.    Objective:  BP 100/60 mmHg  Pulse 103  Wt 148 lb (67.132 kg)  SpO2 95%  BP Readings from Last 3 Encounters:  07/19/15 100/60  06/02/15 138/74  05/31/15 124/70    Wt Readings from Last 3 Encounters:  07/19/15 148 lb (67.132 kg)  06/02/15 152 lb (68.947 kg)  05/31/15 156 lb (70.761 kg)    Physical Exam  Constitutional: She appears well-developed. No distress.  HENT:  Head: Normocephalic.  Right Ear: External ear normal.  Left Ear: External ear normal.  Nose: Nose normal.  Mouth/Throat: Oropharynx is clear and moist.  Eyes: Conjunctivae are normal. Pupils are equal, round, and reactive to light. Right eye exhibits no discharge. Left eye exhibits no discharge.  Neck: Normal range of motion. Neck supple. No JVD present. No tracheal deviation present. No thyromegaly present.  Cardiovascular: Normal rate, regular rhythm and normal heart sounds.   Pulmonary/Chest: No stridor. No respiratory distress. She has no wheezes.  Abdominal: Soft. Bowel sounds are normal. She exhibits no distension and no mass. There is no tenderness. There is no rebound and no guarding.  Musculoskeletal: She exhibits no edema or tenderness.  Lymphadenopathy:    She has no cervical adenopathy.  Neurological: She displays normal reflexes. No cranial  nerve deficit. She exhibits normal muscle tone. Coordination normal.  Skin: No rash noted. No erythema.  Psychiatric: She has a normal mood and affect. Her behavior is normal. Judgment and thought content normal.  Ataxic. Using a cane  Lab Results  Component Value Date   WBC 6.1 04/25/2015   HGB 12.0 04/25/2015   HCT 35.9* 04/25/2015   PLT 352 04/25/2015   GLUCOSE 102* 04/25/2015   CHOL 129  10/27/2014   TRIG 82 10/27/2014   HDL 45 10/27/2014   LDLCALC 68 10/27/2014   ALT 10* 02/05/2015   AST 15 02/05/2015   NA 141 04/25/2015   K 3.9 04/25/2015   CL 105 04/25/2015   CREATININE 0.97 04/25/2015   BUN 19 04/25/2015   CO2 26 04/25/2015   TSH 4.305 10/28/2014   INR 1.12 02/05/2015   HGBA1C 5.5 03/30/2015    No results found.  Assessment & Plan:   Diagnoses and all orders for this visit:  Type 2 diabetes mellitus with other circulatory complications ([name]) -     Basic metabolic panel; Standing -     Hemoglobin A1c; Standing  Cerebrovascular accident (CVA) due to embolism of precerebral artery ([name])  Essential hypertension  Loss of weight  Other orders -     lubiprostone (AMITIZA) 24 MCG capsule; 1 po qd or bid prn constipation   I have discontinued Ms. Dokes's cyanocobalamin, polyethylene glycol powder, and ALPRAZolam. I am also having her start on lubiprostone. Additionally, I am having her maintain her acetaminophen, CETAPHIL DAILY ADVANCE, donepezil, spironolactone, metFORMIN, cholecalciferol, escitalopram, aspirin EC, and CVS VITAMIN B12.  Meds ordered this encounter  Medications  . DISCONTD: ALPRAZolam (XANAX) 0.25 MG tablet    Sig: as needed.  . lubiprostone (AMITIZA) 24 MCG capsule    Sig: 1 po qd or bid prn constipation    Dispense:  60 capsule    Refill:  5     Follow-up: Return in about 4 months (around 11/16/2015) for a follow-up visit.  Sonda Primes, MD

## 2015-07-21 ENCOUNTER — Telehealth: Payer: Self-pay | Admitting: Internal Medicine

## 2015-07-21 NOTE — Telephone Encounter (Signed)
Yes - try 25 mg at night Thx

## 2015-07-21 NOTE — Telephone Encounter (Signed)
Pt is wondering if she can take benadryl I believe to help her sleep since she was taken off another medicine. Please call patient at [phone]

## 2015-07-22 NOTE — Telephone Encounter (Signed)
Pt informed

## 2015-07-30 ENCOUNTER — Telehealth: Payer: Self-pay | Admitting: Internal Medicine

## 2015-07-30 NOTE — Telephone Encounter (Signed)
I called number above x 3.  Number is busy. Will try again later.

## 2015-07-30 NOTE — Telephone Encounter (Signed)
Pt's spouse called request to speak to the assistant concern Tracy [name]. Please give him call back  Phone # [phone]

## 2015-08-01 LAB — CUP PACEART REMOTE DEVICE CHECK: MDC IDC SESS DTM: 20161021120536

## 2015-08-01 NOTE — Progress Notes (Signed)
Carelink summary report received. Battery status OK. Normal device function. No new symptom , tachy, brady, or AF episodes. 1 pause episode--undersensing due to variable R-wave amplitude. Monthly summary reports and ROV with SK in 06/2016.

## 2015-08-02 ENCOUNTER — Ambulatory Visit (INDEPENDENT_AMBULATORY_CARE_PROVIDER_SITE_OTHER): Payer: Commercial Managed Care - HMO | Admitting: *Deleted

## 2015-08-02 DIAGNOSIS — I639 Cerebral infarction, unspecified: Secondary | ICD-10-CM | POA: Diagnosis not present

## 2015-08-03 NOTE — Progress Notes (Signed)
LOOP RECORDER  

## 2015-08-06 ENCOUNTER — Telehealth: Payer: Self-pay | Admitting: Internal Medicine

## 2015-08-06 NOTE — Telephone Encounter (Signed)
Would like to know if she can continue to take alprezolam?

## 2015-08-09 NOTE — Telephone Encounter (Signed)
It is better to use Lexapro instead Thx

## 2015-08-11 NOTE — Telephone Encounter (Signed)
Tried to call pt again and the phone only rang. No voicemail.

## 2015-08-11 NOTE — Telephone Encounter (Signed)
Called several times yesterday and today. Number has been busy.

## 2015-08-16 NOTE — Telephone Encounter (Signed)
Pt dtr took message about medication change. Pt was not home and dtr was there to take care of her affairs.

## 2015-08-23 ENCOUNTER — Ambulatory Visit: Payer: Commercial Managed Care - HMO | Admitting: Internal Medicine

## 2015-08-23 DIAGNOSIS — Z0289 Encounter for other administrative examinations: Secondary | ICD-10-CM

## 2015-09-01 ENCOUNTER — Ambulatory Visit (INDEPENDENT_AMBULATORY_CARE_PROVIDER_SITE_OTHER): Payer: Commercial Managed Care - HMO | Admitting: *Deleted

## 2015-09-01 DIAGNOSIS — I639 Cerebral infarction, unspecified: Secondary | ICD-10-CM | POA: Diagnosis not present

## 2015-09-01 NOTE — Progress Notes (Signed)
Carelink Summary Report / Loop Recorder 

## 2015-09-10 LAB — CUP PACEART REMOTE DEVICE CHECK: MDC IDC SESS DTM: 20161120123524

## 2015-09-18 LAB — CUP PACEART REMOTE DEVICE CHECK: MDC IDC SESS DTM: 20161220123528

## 2015-09-21 ENCOUNTER — Other Ambulatory Visit: Payer: Self-pay | Admitting: Internal Medicine

## 2015-09-30 ENCOUNTER — Ambulatory Visit (INDEPENDENT_AMBULATORY_CARE_PROVIDER_SITE_OTHER): Payer: Commercial Managed Care - HMO | Admitting: *Deleted

## 2015-09-30 DIAGNOSIS — I639 Cerebral infarction, unspecified: Secondary | ICD-10-CM

## 2015-09-30 NOTE — Progress Notes (Signed)
Carelink Summary Report / Loop Recorder 

## 2015-10-08 ENCOUNTER — Telehealth: Payer: Self-pay | Admitting: Internal Medicine

## 2015-10-08 ENCOUNTER — Ambulatory Visit (INDEPENDENT_AMBULATORY_CARE_PROVIDER_SITE_OTHER): Payer: Commercial Managed Care - HMO | Admitting: Family

## 2015-10-08 ENCOUNTER — Encounter: Payer: Self-pay | Admitting: Family

## 2015-10-08 VITALS — BP 112/74 | HR 63 | Temp 97.5°F | Resp 14 | Ht 65.0 in | Wt 149.0 lb

## 2015-10-08 DIAGNOSIS — G44209 Tension-type headache, unspecified, not intractable: Secondary | ICD-10-CM | POA: Insufficient documentation

## 2015-10-08 MED ORDER — IBUPROFEN-FAMOTIDINE 800-26.6 MG PO TABS: 1.0000 | ORAL_TABLET | Freq: Two times a day (BID) | ORAL | Status: DC | PRN

## 2015-10-08 NOTE — Assessment & Plan Note (Addendum)
In office POCT blood glucose of 84. Neurological exam benign with exception of eye tracking which is a baseline issue. Does describe old eyeglasses and has a follow-up ophthalmology exam on Monday. Symptoms and exam consistent with tension-type headache. Start ibuprofen or Tylenol as needed for headaches. Sample of Duexis provided.  Follow-up if symptoms worsen or worse headache of her life develops.

## 2015-10-08 NOTE — Progress Notes (Signed)
Subjective:    Patient ID: Tracy [name], female    DOB: 1940/12/16, 75 y.o.   MRN: [id number]  Chief Complaint  Patient presents with  . Headache    has had a really bad headache for about a week, it will go away and then come back, no other sxs associated     HPI:  Tracy [name] is a 75 y.o. female who  has a past medical history of Diabetes mellitus without complication ([name]); Hypertension; Stroke [facility] Acute cholecystitis (08/10/2013); Depression; Anxiety; and Restless leg syndrome. and presents today for an acute office visit.   This is a new problem. Associated symptom of a headache has been going on for approximately one week with a severity that is described as really bad and is refractory to the modifying factor of Tylenol. Pain is described as achy and dull.Denies worse headache of her life or changes in vision. She does have significant history of a CVA several years ago with no major residual deficits.    Lab Results  Component Value Date   HGBA1C 5.5 03/30/2015   .lastb Allergies  Allergen Reactions  . Wellbutrin [Bupropion] Other (See Comments)    Wt loss     Current Outpatient Prescriptions on File Prior to Visit  Medication Sig Dispense Refill  . acetaminophen (TYLENOL) 325 MG tablet Take 650 mg by mouth every 6 (six) hours as needed for pain.    Marland Kitchen aspirin EC 81 MG tablet Take 81 mg by mouth daily.    . cholecalciferol (VITAMIN D) 1000 UNITS tablet Take 1,000 Units by mouth daily.    . CVS VITAMIN B12 1000 MCG tablet TAKE 1 TABLET (1,000 MCG TOTAL) BY MOUTH DAILY. 30 tablet 5  . donepezil (ARICEPT) 5 MG tablet Take 1 tablet (5 mg total) by mouth at bedtime. 30 tablet 11  . Emollient (CETAPHIL DAILY ADVANCE) LOTN Use bid (Patient taking differently: Apply 1 application topically 2 (two) times daily. Use bid) 473 g 2  . escitalopram (LEXAPRO) 5 MG tablet TAKE 1 TABLET BY MOUTH EVERY DAY 30 tablet 5  . lubiprostone (AMITIZA) 24 MCG capsule 1 po qd or bid  prn constipation 60 capsule 5  . metFORMIN (GLUCOPHAGE) 500 MG tablet Take 1 tablet (500 mg total) by mouth daily with breakfast. 30 tablet 11  . spironolactone (ALDACTONE) 25 MG tablet Take 1 tablet (25 mg total) by mouth daily. 30 tablet 11  . spironolactone (ALDACTONE) 25 MG tablet TAKE 1 TABLET (25 MG TOTAL) BY MOUTH DAILY. 30 tablet 11   No current facility-administered medications on file prior to visit.     Past Surgical History  Procedure Laterality Date  . Ercp N/A 08/12/2013    Procedure: ENDOSCOPIC RETROGRADE CHOLANGIOPANCREATOGRAPHY (ERCP);  Surgeon: Theda Belfast, MD;  Location: [facility] ENDOSCOPY;  Service: Endoscopy;  Laterality: N/A;  . Sphincterotomy  08/12/2013    Procedure: SPHINCTEROTOMY;  Surgeon: Theda Belfast, MD;  Location: [facility] ENDOSCOPY;  Service: Endoscopy;;  . Cholecystectomy N/A 08/13/2013    Procedure: LAPAROSCOPIC CHOLECYSTECTOMY ;  Surgeon: Currie Paris, MD;  Location: [facility] OR;  Service: General;  Laterality: N/A;  . Incisional hernia repair N/A 08/20/2013    Procedure: HERNIA REPAIR INCISIONAL UMBILICAL;  Surgeon: Velora Heckler, MD;  Location: [facility] OR;  Service: General;  Laterality: N/A;  . Ep implantable device N/A 06/02/2015    Procedure: Loop Recorder Insertion;  Surgeon: Duke Salvia, MD;  Location: [facility] INVASIVE CV LAB;  Service: Cardiovascular;  Laterality:  N/A;    Past Medical History  Diagnosis Date  . Diabetes mellitus without complication ([name])   . Hypertension   . Stroke ([name])   . Acute cholecystitis 08/10/2013    Lap chole on 08/13/13   . Depression   . Anxiety   . Restless leg syndrome      Review of Systems  Constitutional: Negative for fever and chills.  HENT: Negative for congestion.   Respiratory: Negative for chest tightness and shortness of breath.   Cardiovascular: Negative for chest pain, palpitations and leg swelling.  Musculoskeletal: Negative for neck pain.  Neurological: Positive for headaches. Negative for dizziness, tremors,  speech difficulty, weakness and light-headedness.      Objective:    BP 112/74 mmHg  Pulse 63  Temp(Src) 97.5 F (36.4 C) (Oral)  Resp 14  Ht  (1.651 m)  Wt 149 lb (67.586 kg)  BMI 24.79 kg/m2  SpO2 98% Nursing note and vital signs reviewed.  Physical Exam  Constitutional: She is oriented to person, place, and time. She appears well-developed and well-nourished. No distress.  Eyes: Conjunctivae are normal. Pupils are equal, round, and reactive to light.  Neck: Neck supple.  Cardiovascular: Normal rate, regular rhythm, normal heart sounds and intact distal pulses.   No murmur heard. Pulmonary/Chest: Effort normal and breath sounds normal.  Lymphadenopathy:    She has no cervical adenopathy.  Neurological: She is alert and oriented to person, place, and time. No cranial nerve deficit. Coordination normal.  Negative pronator drift  Skin: Skin is warm and dry.  Psychiatric: She has a normal mood and affect. Her behavior is normal. Judgment and thought content normal.       Assessment & Plan:   Problem List Items Addressed This Visit      Other   Tension headache - Primary    In office POCT blood glucose of 84. Neurological exam benign with exception of eye tracking which is a baseline issue. Does describe old eyeglasses and has a follow-up ophthalmology exam on Monday. Symptoms and exam consistent with tension-type headache. Start ibuprofen or Tylenol as needed for headaches. Sample of Duexis provided.  Follow-up if symptoms worsen or worse headache of her life develops.      Relevant Medications   Ibuprofen-Famotidine (DUEXIS) 800-26.6 MG TABS

## 2015-10-08 NOTE — Telephone Encounter (Signed)
[facility] - Client TELEPHONE ADVICE RECORD [facility]  Patient Name: Tracy [name]  DOB: 08/01/1941    Initial Comment Caller states has been having a bad headache for a week    Nurse Assessment  Nurse: Wisdom, RN, Susie Date/Time (Eastern Time): 10/08/2015 8:48:56 AM  Confirm and document reason for call. If symptomatic, describe symptoms. You must click the next button to save text entered. ---has been having a bad headache located on forehead; level 8/10; had a prior stroke; history of hypertension; no visual issues - or numbness or weakness noted; ambulates with a cane - no change  Has the patient traveled out of the country within the last 30 days? ---Not Applicable  Does the patient have any new or worsening symptoms? ---Yes  Will a triage be completed? ---Yes  Related visit to physician within the last 2 weeks? ---N/A  Does the PT have any chronic conditions? (i.e. diabetes, asthma, etc.) ---Yes  List chronic conditions. ---hypertension; diabetic  Is this a behavioral health or substance abuse call? ---No     Guidelines    Guideline Title Affirmed Question Affirmed Notes  Headache [1] SEVERE headache (e.g., excruciating) AND [2] not improved after 2 hours of pain medicine    Final Disposition User   See Physician within 4 Hours (or PCP triage) Wisdom, RN, Susie    Referrals  REFERRED TO PCP OFFICE   Disagree/Comply: Comply

## 2015-10-08 NOTE — Patient Instructions (Signed)
Thank you for choosing [facility].  Summary/Instructions:  Your prescription(s) have been submitted to your pharmacy or been printed and provided for you. Please take as directed and contact our office if you believe you are having problem(s) with the medication(s) or have any questions.  If your symptoms worsen or fail to improve, please contact our office for further instruction, or in case of emergency go directly to the emergency room at the closest medical facility.   .Tension Headache A tension headache is a feeling of pain, pressure, or aching that is often felt over the front and sides of the head. The pain can be dull, or it can feel tight (constricting). Tension headaches are not normally associated with nausea or vomiting, and they do not get worse with physical activity. Tension headaches can last from 30 minutes to several days. This is the most common type of headache. CAUSES The exact cause of this condition is not known. Tension headaches often begin after stress, anxiety, or depression. Other triggers may include:  Alcohol.  Too much caffeine, or caffeine withdrawal.  Respiratory infections, such as colds, flu, or sinus infections.  Dental problems or teeth clenching.  Fatigue.  Holding your head and neck in the same position for a long period of time, such as while using a computer.  Smoking. SYMPTOMS Symptoms of this condition include:  A feeling of pressure around the head.  Dull, aching head pain.  Pain felt over the front and sides of the head.  Tenderness in the muscles of the head, neck, and shoulders. DIAGNOSIS This condition may be diagnosed based on your symptoms and a physical exam. Tests may be done, such as a CT scan or an MRI of your head. These tests may be done if your symptoms are severe or unusual. TREATMENT This condition may be treated with lifestyle changes and medicines to help relieve symptoms. HOME CARE INSTRUCTIONS Managing  Pain  Take over-the-counter and prescription medicines only as told by your health care provider.  Lie down in a dark, quiet room when you have a headache.  If directed, apply ice to the head and neck area:  Put ice in a plastic bag.  Place a towel between your skin and the bag.  Leave the ice on for 20 minutes, 2-3 times per day.  Use a heating pad or a hot shower to apply heat to the head and neck area as told by your health care provider. Eating and Drinking  Eat meals on a regular schedule.  Limit alcohol use.  Decrease your caffeine intake, or stop using caffeine. General Instructions  Keep all follow-up visits as told by your health care provider. This is important.  Keep a headache journal to help find out what may trigger your headaches. For example, write down:  What you eat and drink.  How much sleep you get.  Any change to your diet or medicines.  Try massage or other relaxation techniques.  Limit stress.  Sit up straight, and avoid tensing your muscles.  Do not use tobacco products, including cigarettes, chewing tobacco, or e-cigarettes. If you need help quitting, ask your health care provider.  Exercise regularly as told by your health care provider.  Get 7-9 hours of sleep, or the amount recommended by your health care provider. SEEK MEDICAL CARE IF:  Your symptoms are not helped by medicine.  You have a headache that is different from what you normally experience.  You have nausea or you vomit.  You   have a fever. SEEK IMMEDIATE MEDICAL CARE IF:  Your headache becomes severe.  You have repeated vomiting.  You have a stiff neck.  You have a loss of vision.  You have problems with speech.  You have pain in your eye or ear.  You have muscular weakness or loss of muscle control.  You lose your balance or you have trouble walking.  You feel faint or you pass out.  You have confusion.   This information is not intended to replace  advice given to you by your health care provider. Make sure you discuss any questions you have with your health care provider.   Document Released: 08/28/2005 Document Revised: 05/19/2015 Document Reviewed: 12/21/2014 Elsevier Interactive Patient Education 2016 [employer]   

## 2015-10-08 NOTE — Progress Notes (Signed)
Pre visit review using our clinic review tool, if applicable. No additional management support is needed unless otherwise documented below in the visit note. 

## 2015-10-08 NOTE — Telephone Encounter (Signed)
appt been made for today w/Greg Calone...Raechel Chute

## 2015-10-22 DIAGNOSIS — E109 Type 1 diabetes mellitus without complications: Secondary | ICD-10-CM | POA: Diagnosis not present

## 2015-10-22 DIAGNOSIS — I10 Essential (primary) hypertension: Secondary | ICD-10-CM | POA: Diagnosis not present

## 2015-10-22 DIAGNOSIS — H5200 Hypermetropia, unspecified eye: Secondary | ICD-10-CM | POA: Diagnosis not present

## 2015-10-22 DIAGNOSIS — H2510 Age-related nuclear cataract, unspecified eye: Secondary | ICD-10-CM | POA: Diagnosis not present

## 2015-10-22 DIAGNOSIS — H5210 Myopia, unspecified eye: Secondary | ICD-10-CM | POA: Diagnosis not present

## 2015-11-01 ENCOUNTER — Ambulatory Visit (INDEPENDENT_AMBULATORY_CARE_PROVIDER_SITE_OTHER): Payer: Commercial Managed Care - HMO | Admitting: *Deleted

## 2015-11-01 DIAGNOSIS — I639 Cerebral infarction, unspecified: Secondary | ICD-10-CM

## 2015-11-01 NOTE — Progress Notes (Signed)
Carelink Summary Report / Loop Recorder 

## 2015-11-12 ENCOUNTER — Other Ambulatory Visit: Payer: Self-pay | Admitting: Internal Medicine

## 2015-11-15 LAB — CUP PACEART REMOTE DEVICE CHECK: MDC IDC SESS DTM: 20170119130530

## 2015-11-15 NOTE — Progress Notes (Signed)
Carelink summary report received. Battery status OK. Normal device function. No new symptom episodes, tachy episodes, brady, or pause episodes. No new AF episodes. Monthly summary reports and ROV/PRN 

## 2015-11-17 LAB — CUP PACEART REMOTE DEVICE CHECK: Date Time Interrogation Session: 20170218133550

## 2015-11-17 NOTE — Progress Notes (Signed)
Carelink summary report received. Battery status OK. Normal device function. No new symptom episodes, tachy episodes, brady, or pause episodes. No new AF episodes. Monthly summary reports and ROV/PRN 

## 2015-11-18 ENCOUNTER — Telehealth: Payer: Self-pay | Admitting: Internal Medicine

## 2015-11-18 DIAGNOSIS — E1159 Type 2 diabetes mellitus with other circulatory complications: Secondary | ICD-10-CM

## 2015-11-18 MED ORDER — METFORMIN HCL 500 MG PO TABS: 500.0000 mg | ORAL_TABLET | Freq: Every day | ORAL | Status: DC

## 2015-11-18 NOTE — Telephone Encounter (Signed)
Patient called to request refill of metFORMIN (GLUCOPHAGE) 500 MG tablet [[id number]][[id number]] sent in to [facility] [address]. She states that she is totally out at this point

## 2015-11-18 NOTE — Telephone Encounter (Signed)
1 month supply sent to pharmacy in PCP's absence.

## 2015-11-19 MED ORDER — GLUCOSE BLOOD VI STRP: ORAL_STRIP | Status: DC

## 2015-11-19 NOTE — Telephone Encounter (Signed)
Pt called in said that she went to ask for her strips to check her sugar and not the Metformin.  She needs a refill on her strips

## 2015-11-19 NOTE — Telephone Encounter (Signed)
Pt was actually needing the lancets for her One Touch Verio Flex

## 2015-11-19 NOTE — Telephone Encounter (Signed)
rx sent in 

## 2015-11-29 ENCOUNTER — Ambulatory Visit (INDEPENDENT_AMBULATORY_CARE_PROVIDER_SITE_OTHER): Payer: Commercial Managed Care - HMO | Admitting: *Deleted

## 2015-11-29 DIAGNOSIS — I639 Cerebral infarction, unspecified: Secondary | ICD-10-CM

## 2015-11-30 ENCOUNTER — Encounter: Payer: Self-pay | Admitting: Internal Medicine

## 2015-11-30 ENCOUNTER — Ambulatory Visit (INDEPENDENT_AMBULATORY_CARE_PROVIDER_SITE_OTHER): Payer: Commercial Managed Care - HMO | Admitting: Internal Medicine

## 2015-11-30 ENCOUNTER — Other Ambulatory Visit (INDEPENDENT_AMBULATORY_CARE_PROVIDER_SITE_OTHER): Payer: Commercial Managed Care - HMO

## 2015-11-30 VITALS — BP 128/62 | HR 80 | Wt 152.0 lb

## 2015-11-30 DIAGNOSIS — K921 Melena: Secondary | ICD-10-CM | POA: Insufficient documentation

## 2015-11-30 DIAGNOSIS — D509 Iron deficiency anemia, unspecified: Secondary | ICD-10-CM | POA: Diagnosis not present

## 2015-11-30 DIAGNOSIS — K5909 Other constipation: Secondary | ICD-10-CM

## 2015-11-30 DIAGNOSIS — I10 Essential (primary) hypertension: Secondary | ICD-10-CM | POA: Diagnosis not present

## 2015-11-30 DIAGNOSIS — K5900 Constipation, unspecified: Secondary | ICD-10-CM

## 2015-11-30 DIAGNOSIS — E1159 Type 2 diabetes mellitus with other circulatory complications: Secondary | ICD-10-CM

## 2015-11-30 DIAGNOSIS — Z8673 Personal history of transient ischemic attack (TIA), and cerebral infarction without residual deficits: Secondary | ICD-10-CM

## 2015-11-30 LAB — CBC WITH DIFFERENTIAL/PLATELET
BASOS ABS: 0.1 10*3/uL (ref 0.0–0.1)
Basophils Relative: 1.5 % (ref 0.0–3.0)
EOS ABS: 0.3 10*3/uL (ref 0.0–0.7)
Eosinophils Relative: 5.1 % — ABNORMAL HIGH (ref 0.0–5.0)
HEMATOCRIT: 38.8 % (ref 36.0–46.0)
HEMOGLOBIN: 13 g/dL (ref 12.0–15.0)
LYMPHS PCT: 50.1 % — AB (ref 12.0–46.0)
Lymphs Abs: 2.5 10*3/uL (ref 0.7–4.0)
MCHC: 33.4 g/dL (ref 30.0–36.0)
MCV: 92.5 fl (ref 78.0–100.0)
Monocytes Absolute: 0.3 10*3/uL (ref 0.1–1.0)
Monocytes Relative: 6.2 % (ref 3.0–12.0)
Neutro Abs: 1.9 10*3/uL (ref 1.4–7.7)
Neutrophils Relative %: 37.1 % — ABNORMAL LOW (ref 43.0–77.0)
PLATELETS: 343 10*3/uL (ref 150.0–400.0)
RBC: 4.2 Mil/uL (ref 3.87–5.11)
RDW: 15.5 % (ref 11.5–15.5)
WBC: 5 10*3/uL (ref 4.0–10.5)

## 2015-11-30 LAB — HEPATIC FUNCTION PANEL
ALK PHOS: 66 U/L (ref 39–117)
ALT: 8 U/L (ref 0–35)
AST: 13 U/L (ref 0–37)
Albumin: 4.3 g/dL (ref 3.5–5.2)
BILIRUBIN DIRECT: 0.1 mg/dL (ref 0.0–0.3)
TOTAL PROTEIN: 7.4 g/dL (ref 6.0–8.3)
Total Bilirubin: 0.5 mg/dL (ref 0.2–1.2)

## 2015-11-30 LAB — BASIC METABOLIC PANEL
BUN: 24 mg/dL — ABNORMAL HIGH (ref 6–23)
CHLORIDE: 105 meq/L (ref 96–112)
CO2: 31 meq/L (ref 19–32)
CREATININE: 1 mg/dL (ref 0.40–1.20)
Calcium: 9.6 mg/dL (ref 8.4–10.5)
GFR: 69.63 mL/min (ref >60.00)
Glucose, Bld: 95 mg/dL (ref 70–99)
Potassium: 4.2 mEq/L (ref 3.5–5.1)
Sodium: 143 mEq/L (ref 135–145)

## 2015-11-30 LAB — IBC PANEL
Iron: 64 ug/dL (ref 42–145)
SATURATION RATIOS: 16.2 % — AB (ref 20.0–50.0)
Transferrin: 282 mg/dL (ref 212.0–360.0)

## 2015-11-30 LAB — HEMOGLOBIN A1C: HEMOGLOBIN A1C: 5.9 % (ref 4.6–6.5)

## 2015-11-30 MED ORDER — ESCITALOPRAM OXALATE 5 MG PO TABS: 5.0000 mg | ORAL_TABLET | Freq: Every day | ORAL | Status: DC

## 2015-11-30 NOTE — Assessment & Plan Note (Signed)
Worse GI ref Dr Elnoria HowardHung

## 2015-11-30 NOTE — Progress Notes (Signed)
Pre visit review using our clinic review tool, if applicable. No additional management support is needed unless otherwise documented below in the visit note. 

## 2015-11-30 NOTE — Progress Notes (Signed)
Carelink Summary Report / Loop Recorder 

## 2015-11-30 NOTE — Assessment & Plan Note (Signed)
Labs On Metformin 

## 2015-11-30 NOTE — Assessment & Plan Note (Signed)
R hemiparesis On Plavix, Pravachol

## 2015-11-30 NOTE — Assessment & Plan Note (Signed)
On CSpironolactone

## 2015-11-30 NOTE — Progress Notes (Signed)
Subjective:  Patient ID: Tracy [name], female    DOB: 02/22/1941  Age: 75 y.o. MRN: [id number]  CC: No chief complaint on file.   HPI Tracy [name] presents for CVA, HTN, anxiety f/u, C/o passing blood w/stool 2 mo ago; constipated  Outpatient Prescriptions Prior to Visit  Medication Sig Dispense Refill  . acetaminophen (TYLENOL) 325 MG tablet Take 650 mg by mouth every 6 (six) hours as needed for pain.    Marland Kitchen. aspirin EC 81 MG tablet Take 81 mg by mouth daily.    . cholecalciferol (VITAMIN D) 1000 UNITS tablet Take 1,000 Units by mouth daily.    . CVS VITAMIN B12 1000 MCG tablet TAKE 1 TABLET (1,000 MCG TOTAL) BY MOUTH DAILY. 30 tablet 5  . donepezil (ARICEPT) 5 MG tablet Take 1 tablet (5 mg total) by mouth at bedtime. 30 tablet 11  . Emollient (CETAPHIL DAILY ADVANCE) LOTN Use bid (Patient taking differently: Apply 1 application topically 2 (two) times daily. Use bid) 473 g 2  . glucose blood test strip One touch Verio Flex.  Use as instructed to test sugars daily and prn.  E11.59 100 each 12  . Ibuprofen-Famotidine (DUEXIS) 800-26.6 MG TABS Take 1 tablet by mouth 2 (two) times daily as needed. 9 tablet 0  . lubiprostone (AMITIZA) 24 MCG capsule 1 po qd or bid prn constipation 60 capsule 5  . metFORMIN (GLUCOPHAGE) 500 MG tablet Take 1 tablet (500 mg total) by mouth daily with breakfast. 30 tablet 0  . spironolactone (ALDACTONE) 25 MG tablet Take 1 tablet (25 mg total) by mouth daily. 30 tablet 11  . spironolactone (ALDACTONE) 25 MG tablet TAKE 1 TABLET (25 MG TOTAL) BY MOUTH DAILY. 30 tablet 11  . escitalopram (LEXAPRO) 5 MG tablet TAKE 1 TABLET BY MOUTH EVERY DAY 90 tablet 1   No facility-administered medications prior to visit.    ROS Review of Systems  Constitutional: Negative for chills, activity change, appetite change, fatigue and unexpected weight change.  HENT: Negative for congestion, mouth sores and sinus pressure.   Eyes: Negative for visual disturbance.    Respiratory: Negative for cough and chest tightness.   Gastrointestinal: Positive for blood in stool. Negative for nausea, vomiting, abdominal pain and rectal pain.  Genitourinary: Negative for frequency, difficulty urinating and vaginal pain.  Musculoskeletal: Negative for back pain, gait problem and neck stiffness.  Skin: Negative for pallor and rash.  Neurological: Negative for dizziness, tremors, weakness, numbness and headaches.  Psychiatric/Behavioral: Positive for decreased concentration. Negative for suicidal ideas, confusion and sleep disturbance. The patient is nervous/anxious.     Objective:  BP 128/62 mmHg  Pulse 80  Wt 152 lb (68.947 kg)  SpO2 97%  BP Readings from Last 3 Encounters:  11/30/15 128/62  10/08/15 112/74  07/19/15 100/60    Wt Readings from Last 3 Encounters:  11/30/15 152 lb (68.947 kg)  10/08/15 149 lb (67.586 kg)  07/19/15 148 lb (67.132 kg)    Physical Exam  Constitutional: She appears well-developed. No distress.  HENT:  Head: Normocephalic.  Right Ear: External ear normal.  Left Ear: External ear normal.  Nose: Nose normal.  Mouth/Throat: Oropharynx is clear and moist.  Eyes: Conjunctivae are normal. Pupils are equal, round, and reactive to light. Right eye exhibits no discharge. Left eye exhibits no discharge.  Neck: Normal range of motion. Neck supple. No JVD present. No tracheal deviation present. No thyromegaly present.  Cardiovascular: Normal rate, regular rhythm and normal heart sounds.  Pulmonary/Chest: No stridor. No respiratory distress. She has no wheezes.  Abdominal: Soft. Bowel sounds are normal. She exhibits no distension and no mass. There is no tenderness. There is no rebound and no guarding.  Musculoskeletal: She exhibits no edema or tenderness.  Lymphadenopathy:    She has no cervical adenopathy.  Neurological: She displays normal reflexes. No cranial nerve deficit. She exhibits normal muscle tone. Coordination normal.   Skin: No rash noted. No erythema.  Psychiatric: She has a normal mood and affect. Her behavior is normal. Judgment and thought content normal.    Lab Results  Component Value Date   WBC 6.1 04/25/2015   HGB 12.0 04/25/2015   HCT 35.9* 04/25/2015   PLT 352 04/25/2015   GLUCOSE 102* 04/25/2015   CHOL 129 10/27/2014   TRIG 82 10/27/2014   HDL 45 10/27/2014   LDLCALC 68 10/27/2014   ALT 10* 02/05/2015   AST 15 02/05/2015   NA 141 04/25/2015   K 3.9 04/25/2015   CL 105 04/25/2015   CREATININE 0.97 04/25/2015   BUN 19 04/25/2015   CO2 26 04/25/2015   TSH 4.305 10/28/2014   INR 1.12 02/05/2015   HGBA1C 5.5 03/30/2015    No results found.  Assessment & Plan:   Diagnoses and all orders for this visit:  Blood in stool -     Hemoglobin A1c; Future -     CBC with Differential/Platelet; Future -     Hepatic function panel; Future -     IBC panel; Future -     Basic metabolic panel; Future -     Ambulatory referral to Gastroenterology  Anemia, iron deficiency -     Hemoglobin A1c; Future -     CBC with Differential/Platelet; Future -     Hepatic function panel; Future -     IBC panel; Future -     Basic metabolic panel; Future  Type 2 diabetes mellitus with other circulatory complications ([name]) -     Hemoglobin A1c; Future -     CBC with Differential/Platelet; Future -     Hepatic function panel; Future -     IBC panel; Future -     Basic metabolic panel; Future  Essential hypertension -     Hemoglobin A1c; Future -     CBC with Differential/Platelet; Future -     Hepatic function panel; Future -     IBC panel; Future -     Basic metabolic panel; Future  Constipation, chronic -     Hemoglobin A1c; Future -     CBC with Differential/Platelet; Future -     Hepatic function panel; Future -     IBC panel; Future -     Basic metabolic panel; Future -     Ambulatory referral to Gastroenterology  Other orders -     escitalopram (LEXAPRO) 5 MG tablet; Take 1 tablet  (5 mg total) by mouth daily.  I have changed Tracy [name]'s escitalopram. I am also having her maintain her acetaminophen, CETAPHIL DAILY ADVANCE, donepezil, spironolactone, cholecalciferol, aspirin EC, CVS VITAMIN B12, lubiprostone, spironolactone, Ibuprofen-Famotidine, metFORMIN, and glucose blood.  Meds ordered this encounter  Medications  . escitalopram (LEXAPRO) 5 MG tablet    Sig: Take 1 tablet (5 mg total) by mouth daily.    Dispense:  90 tablet    Refill:  1     Follow-up: Return in about 3 months (around 03/01/2016) for a follow-up visit.  Sonda Primes, MD

## 2015-11-30 NOTE — Assessment & Plan Note (Signed)
Gi ref

## 2015-11-30 NOTE — Assessment & Plan Note (Signed)
Labs

## 2015-12-15 ENCOUNTER — Emergency Department (HOSPITAL_COMMUNITY): Payer: Commercial Managed Care - HMO

## 2015-12-15 ENCOUNTER — Encounter (HOSPITAL_COMMUNITY): Payer: Self-pay | Admitting: Adult Health

## 2015-12-15 DIAGNOSIS — F419 Anxiety disorder, unspecified: Secondary | ICD-10-CM | POA: Diagnosis not present

## 2015-12-15 DIAGNOSIS — Z8719 Personal history of other diseases of the digestive system: Secondary | ICD-10-CM | POA: Insufficient documentation

## 2015-12-15 DIAGNOSIS — Z8673 Personal history of transient ischemic attack (TIA), and cerebral infarction without residual deficits: Secondary | ICD-10-CM | POA: Diagnosis not present

## 2015-12-15 DIAGNOSIS — F329 Major depressive disorder, single episode, unspecified: Secondary | ICD-10-CM | POA: Diagnosis not present

## 2015-12-15 DIAGNOSIS — Z8669 Personal history of other diseases of the nervous system and sense organs: Secondary | ICD-10-CM | POA: Insufficient documentation

## 2015-12-15 DIAGNOSIS — Z7982 Long term (current) use of aspirin: Secondary | ICD-10-CM | POA: Diagnosis not present

## 2015-12-15 DIAGNOSIS — E119 Type 2 diabetes mellitus without complications: Secondary | ICD-10-CM | POA: Diagnosis not present

## 2015-12-15 DIAGNOSIS — I10 Essential (primary) hypertension: Secondary | ICD-10-CM | POA: Insufficient documentation

## 2015-12-15 DIAGNOSIS — Z79899 Other long term (current) drug therapy: Secondary | ICD-10-CM | POA: Insufficient documentation

## 2015-12-15 DIAGNOSIS — J069 Acute upper respiratory infection, unspecified: Secondary | ICD-10-CM | POA: Diagnosis not present

## 2015-12-15 DIAGNOSIS — Z7984 Long term (current) use of oral hypoglycemic drugs: Secondary | ICD-10-CM | POA: Diagnosis not present

## 2015-12-15 DIAGNOSIS — R05 Cough: Secondary | ICD-10-CM | POA: Diagnosis not present

## 2015-12-15 DIAGNOSIS — R0602 Shortness of breath: Secondary | ICD-10-CM | POA: Diagnosis not present

## 2015-12-15 LAB — BASIC METABOLIC PANEL
ANION GAP: 11 (ref 5–15)
BUN: 20 mg/dL (ref 6–20)
CALCIUM: 9.1 mg/dL (ref 8.9–10.3)
CO2: 27 mmol/L (ref 22–32)
Chloride: 103 mmol/L (ref 101–111)
Creatinine, Ser: 1.02 mg/dL — ABNORMAL HIGH (ref 0.44–1.00)
GFR, EST NON AFRICAN AMERICAN: 53 mL/min — AB (ref >60)
GLUCOSE: 101 mg/dL — AB (ref 65–99)
POTASSIUM: 3.8 mmol/L (ref 3.5–5.1)
Sodium: 141 mmol/L (ref 135–145)

## 2015-12-15 LAB — CBC
HEMATOCRIT: 35.9 % — AB (ref 36.0–46.0)
HEMOGLOBIN: 11.8 g/dL — AB (ref 12.0–15.0)
MCH: 30.1 pg (ref 26.0–34.0)
MCHC: 32.9 g/dL (ref 30.0–36.0)
MCV: 91.6 fL (ref 78.0–100.0)
Platelets: 312 10*3/uL (ref 150–400)
RBC: 3.92 MIL/uL (ref 3.87–5.11)
RDW: 14.6 % (ref 11.5–15.5)
WBC: 6.5 10*3/uL (ref 4.0–10.5)

## 2015-12-15 LAB — I-STAT TROPONIN, ED: TROPONIN I, POC: 0 ng/mL (ref 0.00–0.08)

## 2015-12-15 NOTE — ED Notes (Signed)
PResents with onset of SOB that began last night after she felt like something was jumping in her "wind pipe and I couldn't breath for a long time last night" denies nausesa, dis=zzines and feeling SOB today. Alert and oreinted. denies pain.

## 2015-12-16 ENCOUNTER — Emergency Department (HOSPITAL_COMMUNITY)
Admission: EM | Admit: 2015-12-16 | Discharge: 2015-12-16 | Disposition: A | Discharge status: Home / Self Care | Payer: Commercial Managed Care - HMO | Attending: Emergency Medicine | Admitting: Emergency Medicine

## 2015-12-16 DIAGNOSIS — B9789 Other viral agents as the cause of diseases classified elsewhere: Secondary | ICD-10-CM

## 2015-12-16 DIAGNOSIS — J069 Acute upper respiratory infection, unspecified: Secondary | ICD-10-CM

## 2015-12-16 MED ORDER — DM-GUAIFENESIN ER 30-600 MG PO TB12
1.0000 | ORAL_TABLET | Freq: Two times a day (BID) | ORAL | Status: DC
  Administered 2015-12-16: 1 via ORAL
  Filled 2015-12-16: qty 1

## 2015-12-16 MED ORDER — ACETAMINOPHEN 500 MG PO TABS
1000.0000 mg | ORAL_TABLET | Freq: Once | ORAL | Status: AC
  Administered 2015-12-16: 1000 mg via ORAL
  Filled 2015-12-16: qty 2

## 2015-12-16 MED ORDER — IPRATROPIUM-ALBUTEROL 0.5-2.5 (3) MG/3ML IN SOLN
3.0000 mL | RESPIRATORY_TRACT | Status: DC
  Administered 2015-12-16: 3 mL via RESPIRATORY_TRACT
  Filled 2015-12-16: qty 3

## 2015-12-16 NOTE — Discharge Instructions (Signed)
Upper Respiratory Infection, Adult Tracy [name], your chest xray did not show any pneumonia.  Take tylenol and mucinex over the counter as needed for your symptoms.  See your primary care doctor within 3 days for close follow up. If symptoms worsen, come back to the ED immediately. Thank you. Most upper respiratory infections (URIs) are caused by a virus. A URI affects the nose, throat, and upper air passages. The most common type of URI is often called "the common cold." HOME CARE   Take medicines only as told by your doctor.  Gargle warm saltwater or take cough drops to comfort your throat as told by your doctor.  Use a warm mist humidifier or inhale steam from a shower to increase air moisture. This may make it easier to breathe.  Drink enough fluid to keep your pee (urine) clear or pale yellow.  Eat soups and other clear broths.  Have a healthy diet.  Rest as needed.  Go back to work when your fever is gone or your doctor says it is okay.  You may need to stay home longer to avoid giving your URI to others.  You can also wear a face mask and wash your hands often to prevent spread of the virus.  Use your inhaler more if you have asthma.  Do not use any tobacco products, including cigarettes, chewing tobacco, or electronic cigarettes. If you need help quitting, ask your doctor. GET HELP IF:  You are getting worse, not better.  Your symptoms are not helped by medicine.  You have chills.  You are getting more short of breath.  You have brown or red mucus.  You have yellow or brown discharge from your nose.  You have pain in your face, especially when you bend forward.  You have a fever.  You have puffy (swollen) neck glands.  You have pain while swallowing.  You have white areas in the back of your throat. GET HELP RIGHT AWAY IF:   You have very bad or constant:  Headache.  Ear pain.  Pain in your forehead, behind your eyes, and over your cheekbones (sinus  pain).  Chest pain.  You have long-lasting (chronic) lung disease and any of the following:  Wheezing.  Long-lasting cough.  Coughing up blood.  A change in your usual mucus.  You have a stiff neck.  You have changes in your:  Vision.  Hearing.  Thinking.  Mood. MAKE SURE YOU:   Understand these instructions.  Will watch your condition.  Will get help right away if you are not doing well or get worse.   This information is not intended to replace advice given to you by your health care provider. Make sure you discuss any questions you have with your health care provider.   Document Released: 02/14/2008 Document Revised: 01/12/2015 Document Reviewed: 12/03/2013 Elsevier Interactive Patient Education [employer]2016 Elsevier Inc.

## 2015-12-16 NOTE — ED Provider Notes (Signed)
CSN: [id number]     Arrival date & time 12/15/15  2149 History  By signing my name below, I, Doreatha Martin, attest that this documentation has been prepared under the direction and in the presence of Tomasita Crumble, MD. Electronically Signed: Doreatha Martin, ED Scribe. 12/16/2015. 1:38 AM.    Chief Complaint  Patient presents with  . Shortness of Breath   The history is provided by the patient. No language interpreter was used.   HPI Comments: Tracy [name] is a 75 y.o. female with h/o DM, HTN who presents to the Emergency Department complaining of mild SOB onset a week ago. Per pt, she feels as though she is gasping for air, but states she is getting adequate breaths. She notes she has also had recent congestion, rhinorrhea, productive cough with white phlegm, fever, generalized myalgias and sore throat with her SOB. Pt states sick contact with husband who had similar symptoms for 2 weeks. Pt states that she got a flu shot this year. She denies CP, nausea, emesis.    Past Medical History  Diagnosis Date  . Diabetes mellitus without complication ([name])   . Hypertension   . Stroke ([name])   . Acute cholecystitis 08/10/2013    Lap chole on 08/13/13   . Depression   . Anxiety   . Restless leg syndrome    Past Surgical History  Procedure Laterality Date  . Ercp N/A 08/12/2013    Procedure: ENDOSCOPIC RETROGRADE CHOLANGIOPANCREATOGRAPHY (ERCP);  Surgeon: Theda Belfast, MD;  Location: [facility] ENDOSCOPY;  Service: Endoscopy;  Laterality: N/A;  . Sphincterotomy  08/12/2013    Procedure: SPHINCTEROTOMY;  Surgeon: Theda Belfast, MD;  Location: [facility] ENDOSCOPY;  Service: Endoscopy;;  . Cholecystectomy N/A 08/13/2013    Procedure: LAPAROSCOPIC CHOLECYSTECTOMY ;  Surgeon: Currie Paris, MD;  Location: [facility] OR;  Service: General;  Laterality: N/A;  . Incisional hernia repair N/A 08/20/2013    Procedure: HERNIA REPAIR INCISIONAL UMBILICAL;  Surgeon: Velora Heckler, MD;  Location: [facility] OR;  Service: General;   Laterality: N/A;  . Ep implantable device N/A 06/02/2015    Procedure: Loop Recorder Insertion;  Surgeon: Duke Salvia, MD;  Location: [facility] INVASIVE CV LAB;  Service: Cardiovascular;  Laterality: N/A;   Family History  Problem Relation Age of Onset  . Stroke Mother    Social History  Substance Use Topics  . Smoking status: Never Smoker   . Smokeless tobacco: None  . Alcohol Use: No   OB History    No data available     Review of Systems A complete 10 system review of systems was obtained and all systems are negative except as noted in the HPI and PMH.    Allergies  Wellbutrin  Home Medications   Prior to Admission medications   Medication Sig Start Date End Date Taking? Authorizing Provider  acetaminophen (TYLENOL) 325 MG tablet Take 650 mg by mouth every 6 (six) hours as needed for pain.    Historical Provider, MD  aspirin EC 81 MG tablet Take 81 mg by mouth daily.    Historical Provider, MD  cholecalciferol (VITAMIN D) 1000 UNITS tablet Take 1,000 Units by mouth daily.    Historical Provider, MD  CVS VITAMIN B12 1000 MCG tablet TAKE 1 TABLET (1,000 MCG TOTAL) BY MOUTH DAILY. 06/21/15   Aleksei Plotnikov V, MD  donepezil (ARICEPT) 5 MG tablet Take 1 tablet (5 mg total) by mouth at bedtime. 02/22/15   Aleksei Plotnikov V, MD  Emollient (CETAPHIL DAILY  ADVANCE) LOTN Use bid Patient taking differently: Apply 1 application topically 2 (two) times daily. Use bid 11/27/14   Aleksei Plotnikov V, MD  escitalopram (LEXAPRO) 5 MG tablet Take 1 tablet (5 mg total) by mouth daily. 11/30/15   Aleksei Plotnikov V, MD  glucose blood test strip One touch Verio Flex.  Use as instructed to test sugars daily and prn.  E11.59 11/19/15   Pincus SanesStacy J Burns, MD  Ibuprofen-Famotidine (DUEXIS) 800-26.6 MG TABS Take 1 tablet by mouth 2 (two) times daily as needed. 10/08/15   Veryl SpeakGregory D Calone, FNP  lubiprostone (AMITIZA) 24 MCG capsule 1 po qd or bid prn constipation 07/19/15   Jacinta ShoeAleksei Plotnikov V, MD  metFORMIN  (GLUCOPHAGE) 500 MG tablet Take 1 tablet (500 mg total) by mouth daily with breakfast. 11/18/15   Veryl SpeakGregory D Calone, FNP  spironolactone (ALDACTONE) 25 MG tablet Take 1 tablet (25 mg total) by mouth daily. 02/22/15   Aleksei Plotnikov V, MD  spironolactone (ALDACTONE) 25 MG tablet TAKE 1 TABLET (25 MG TOTAL) BY MOUTH DAILY. 09/22/15   Aleksei Plotnikov V, MD   BP 111/86 mmHg  Pulse 83  Temp(Src) 99 F (37.2 C) (Oral)  Resp 17  SpO2 97% Physical Exam  Constitutional: She is oriented to person, place, and time. She appears well-developed and well-nourished. No distress.  HENT:  Head: Normocephalic and atraumatic.  Nose: Rhinorrhea present.  Mouth/Throat: Oropharynx is clear and moist. No oropharyngeal exudate.  Eyes: Conjunctivae and EOM are normal. Pupils are equal, round, and reactive to light. No scleral icterus.  Neck: Normal range of motion. Neck supple. No JVD present. No tracheal deviation present. No thyromegaly present.  Cardiovascular: Normal rate, regular rhythm and normal heart sounds.  Exam reveals no gallop and no friction rub.   No murmur heard. Pulmonary/Chest: Effort normal and breath sounds normal. No respiratory distress. She has no wheezes. She exhibits no tenderness.  Abdominal: Soft. Bowel sounds are normal. She exhibits no distension and no mass. There is no tenderness. There is no rebound and no guarding.  Musculoskeletal: Normal range of motion. She exhibits no edema or tenderness.  Lymphadenopathy:    She has no cervical adenopathy.  Neurological: She is alert and oriented to person, place, and time. No cranial nerve deficit. She exhibits normal muscle tone.  Skin: Skin is warm and dry. No rash noted. No erythema. No pallor.  Nursing note and vitals reviewed.   ED Course  Procedures (including critical care time) DIAGNOSTIC STUDIES: Oxygen Saturation is 97% on RA, normal by my interpretation.    COORDINATION OF CARE: 1:27 AM Discussed treatment plan with pt at  bedside which includes lab work, CXR, breathing treatment, mucinex and pt agreed to plan.   Labs Review Labs Reviewed  BASIC METABOLIC PANEL - Abnormal; Notable for the following:    Glucose, Bld 101 (*)    Creatinine, Ser 1.02 (*)    GFR calc non Af Amer 53 (*)    All other components within normal limits  CBC - Abnormal; Notable for the following:    Hemoglobin 11.8 (*)    HCT 35.9 (*)    All other components within normal limits  Rosezena SensorI-STAT TROPOININ, ED    Imaging Review Dg Chest 2 View  12/15/2015  CLINICAL DATA:  Shortness of breath. EXAM: CHEST - 2 VIEW COMPARISON:  04/25/2015 FINDINGS: There is no evidence of pulmonary edema, consolidation, pneumothorax, nodule or pleural fluid. Stable mild tortuosity of the thoracic aorta. Loop recorder device visible in the  lower left anterior chest wall. The visualized skeletal structures are unremarkable. IMPRESSION: No active disease. Electronically Signed   By: Irish Lack M.D.   On: 12/15/2015 23:02   I have personally reviewed and evaluated these images and lab results as part of my medical decision-making.   EKG Interpretation   Date/Time:  Wednesday December 15 2015 22:06:42 EDT Ventricular Rate:  101 PR Interval:  174 QRS Duration: 76 QT Interval:  324 QTC Calculation: 420 R Axis:   60 Text Interpretation:  Sinus tachycardia Otherwise normal ECG tachycardia  now present Confirmed by Erroll Luna [phone]) on 12/16/2015 1:57:30  AM      MDM   Final diagnoses:  None    Patient presents to the ED for SOB.  Symptoms are consistent with flu illness v viral URI.  CXR is negative for pneumonia.  She was given tylenol, mucinex and duoneb for her symptoms.  She states she now feels better and is requesting to go home.  Will DC with recommendations to take tylenol and mucinex as needed. PCP fu advised within 3 days.  She appears well and in NAD.  VS remain within her normal limits and she is safe for DC.    I personally  performed the services described in this documentation, which was scribed in my presence. The recorded information has been reviewed and is accurate.     Tomasita Crumble, MD 12/16/15 [phone]

## 2015-12-16 NOTE — ED Notes (Signed)
Patient's states "I feel much better" and "I am ready to go home"

## 2015-12-16 NOTE — ED Notes (Signed)
Pt departed in NAD.  

## 2015-12-21 ENCOUNTER — Ambulatory Visit (INDEPENDENT_AMBULATORY_CARE_PROVIDER_SITE_OTHER): Payer: Commercial Managed Care - HMO | Admitting: Internal Medicine

## 2015-12-21 ENCOUNTER — Encounter: Payer: Self-pay | Admitting: Internal Medicine

## 2015-12-21 VITALS — BP 118/78 | HR 77 | Temp 98.4°F | Resp 20 | Wt 149.0 lb

## 2015-12-21 DIAGNOSIS — F0390 Unspecified dementia without behavioral disturbance: Secondary | ICD-10-CM

## 2015-12-21 DIAGNOSIS — I10 Essential (primary) hypertension: Secondary | ICD-10-CM

## 2015-12-21 DIAGNOSIS — B349 Viral infection, unspecified: Secondary | ICD-10-CM | POA: Insufficient documentation

## 2015-12-21 NOTE — Progress Notes (Signed)
Subjective:    Patient ID: Tracy [name], female    DOB: July 27, 1941, 75 y.o.   MRN: [id number]  HPI  Here to f/u, hx of dementia, with recent viral illness as PCP not available, seen at ED apr 6 with URI symptoms and cough, normal WBC and cxr neg.  Tx with nebulizer x 1 at ED tylenol and mucinex and has done well. Pt denies chest pain, increased sob or doe, wheezing, orthopnea, PND, increased LE swelling, palpitations, dizziness or syncope.   Pt denies fever, wt loss, night sweats, loss of appetite, or other constitutional symptoms   Pt denies polydipsia, polyuria. Dementia overall stable symptomatically, and not assoc with behavioral changes such as hallucinations, paranoia, or agitation.  Past Medical History  Diagnosis Date  . Diabetes mellitus without complication ([name])   . Hypertension   . Stroke ([name])   . Acute cholecystitis 08/10/2013    Lap chole on 08/13/13   . Depression   . Anxiety   . Restless leg syndrome    Past Surgical History  Procedure Laterality Date  . Ercp N/A 08/12/2013    Procedure: ENDOSCOPIC RETROGRADE CHOLANGIOPANCREATOGRAPHY (ERCP);  Surgeon: Theda Belfast, MD;  Location: [facility] ENDOSCOPY;  Service: Endoscopy;  Laterality: N/A;  . Sphincterotomy  08/12/2013    Procedure: SPHINCTEROTOMY;  Surgeon: Theda Belfast, MD;  Location: [facility] ENDOSCOPY;  Service: Endoscopy;;  . Cholecystectomy N/A 08/13/2013    Procedure: LAPAROSCOPIC CHOLECYSTECTOMY ;  Surgeon: Currie Paris, MD;  Location: [facility] OR;  Service: General;  Laterality: N/A;  . Incisional hernia repair N/A 08/20/2013    Procedure: HERNIA REPAIR INCISIONAL UMBILICAL;  Surgeon: Velora Heckler, MD;  Location: [facility] OR;  Service: General;  Laterality: N/A;  . Ep implantable device N/A 06/02/2015    Procedure: Loop Recorder Insertion;  Surgeon: Duke Salvia, MD;  Location: [facility] INVASIVE CV LAB;  Service: Cardiovascular;  Laterality: N/A;    reports that she has never smoked. She does not have any smokeless tobacco  history on file. She reports that she does not drink alcohol or use illicit drugs. family history includes Stroke in her mother. Allergies  Allergen Reactions  . Wellbutrin [Bupropion] Other (See Comments)    Wt loss   Current Outpatient Prescriptions on File Prior to Visit  Medication Sig Dispense Refill  . acetaminophen (TYLENOL) 325 MG tablet Take 650 mg by mouth every 6 (six) hours as needed for pain.    Marland Kitchen aspirin EC 81 MG tablet Take 81 mg by mouth daily.    . cholecalciferol (VITAMIN D) 1000 UNITS tablet Take 1,000 Units by mouth daily.    . CVS VITAMIN B12 1000 MCG tablet TAKE 1 TABLET (1,000 MCG TOTAL) BY MOUTH DAILY. 30 tablet 5  . donepezil (ARICEPT) 5 MG tablet Take 1 tablet (5 mg total) by mouth at bedtime. 30 tablet 11  . Emollient (CETAPHIL DAILY ADVANCE) LOTN Use bid (Patient taking differently: Apply 1 application topically 2 (two) times daily. Use bid) 473 g 2  . escitalopram (LEXAPRO) 5 MG tablet Take 1 tablet (5 mg total) by mouth daily. 90 tablet 1  . glucose blood test strip One touch Verio Flex.  Use as instructed to test sugars daily and prn.  E11.59 100 each 12  . Ibuprofen-Famotidine (DUEXIS) 800-26.6 MG TABS Take 1 tablet by mouth 2 (two) times daily as needed. 9 tablet 0  . lubiprostone (AMITIZA) 24 MCG capsule 1 po qd or bid prn constipation (Patient taking differently: Take 24  mcg by mouth 2 (two) times daily as needed for constipation. ) 60 capsule 5  . metFORMIN (GLUCOPHAGE) 500 MG tablet Take 1 tablet (500 mg total) by mouth daily with breakfast. 30 tablet 0  . spironolactone (ALDACTONE) 25 MG tablet Take 1 tablet (25 mg total) by mouth daily. 30 tablet 11  . spironolactone (ALDACTONE) 25 MG tablet TAKE 1 TABLET (25 MG TOTAL) BY MOUTH DAILY. 30 tablet 11   No current facility-administered medications on file prior to visit.   Review of Systems . Constitutional: Negative for unusual diaphoresis or night sweats HENT: Negative for ear swelling or  discharge Eyes: Negative for worsening visual haziness  Respiratory: Negative for choking and stridor.   Gastrointestinal: Negative for distension or worsening eructation Genitourinary: Negative for retention or change in urine volume.  Musculoskeletal: Negative for other MSK pain or swelling Skin: Negative for color change and worsening wound Neurological: Negative for tremors and numbness other than noted  Psychiatric/Behavioral: Negative for decreased concentration or agitation other than above       Objective:   Physical Exam BP 118/78 mmHg  Pulse 77  Temp(Src) 98.4 F (36.9 C) (Oral)  Resp 20  Wt 149 lb (67.586 kg)  SpO2 95% VS noted,  Constitutional: Pt appears in no apparent distress HENT: Head: NCAT.  Right Ear: External ear normal.  Left Ear: External ear normal.  Eyes: . Pupils are equal, round, and reactive to light. Conjunctivae and EOM are normal Neck: Normal range of motion. Neck supple.  Cardiovascular: Normal rate and regular rhythm.   Pulmonary/Chest: Effort normal and breath sounds without rales or wheezing.  Abd:  Soft, NT, ND, + BS Neurological: Pt is alert. + baseline confused , motor grossly intact Skin: Skin is warm. No rash, no LE edema Psychiatric: Pt behavior is normal. No agitation.      Assessment & Plan:

## 2015-12-21 NOTE — Patient Instructions (Signed)
Please continue all other medications as before, and refills have been done if requested.  Please have the pharmacy call with any other refills you may need.  Please continue your efforts at being more active, low cholesterol diet, and weight control.  Please keep your appointments with your specialists as you may have planned     

## 2015-12-21 NOTE — Progress Notes (Signed)
Pre visit review using our clinic review tool, if applicable. No additional management support is needed unless otherwise documented below in the visit note. 

## 2015-12-21 NOTE — Assessment & Plan Note (Signed)
stable overall by history and exam, recent data reviewed with pt, and pt to continue medical treatment as before,  to f/u any worsening symptoms or concerns Lab Results  Component Value Date   WBC 6.5 12/15/2015   HGB 11.8* 12/15/2015   HCT 35.9* 12/15/2015   PLT 312 12/15/2015   GLUCOSE 101* 12/15/2015   CHOL 129 10/27/2014   TRIG 82 10/27/2014   HDL 45 10/27/2014   LDLCALC 68 10/27/2014   ALT 8 11/30/2015   AST 13 11/30/2015   NA 141 12/15/2015   K 3.8 12/15/2015   CL 103 12/15/2015   CREATININE 1.02* 12/15/2015   BUN 20 12/15/2015   CO2 27 12/15/2015   TSH 4.305 10/28/2014   INR 1.12 02/05/2015   HGBA1C 5.9 11/30/2015

## 2015-12-21 NOTE — Assessment & Plan Note (Signed)
stable overall by history and exam, recent data reviewed with pt, and pt to continue medical treatment as before,  to f/u any worsening symptoms or concerns BP Readings from Last 3 Encounters:  12/21/15 118/78  12/16/15 114/61  11/30/15 128/62

## 2015-12-21 NOTE — Assessment & Plan Note (Signed)
symtpomatically improved, and exam without worsening or persistent findings, ok to cont tylenol and mucinex otc prn, pt reassured

## 2015-12-22 ENCOUNTER — Other Ambulatory Visit: Payer: Self-pay | Admitting: Internal Medicine

## 2015-12-23 ENCOUNTER — Other Ambulatory Visit: Payer: Self-pay | Admitting: Family

## 2015-12-23 ENCOUNTER — Telehealth: Payer: Self-pay | Admitting: Internal Medicine

## 2015-12-23 ENCOUNTER — Other Ambulatory Visit: Payer: Self-pay | Admitting: Internal Medicine

## 2015-12-23 NOTE — Telephone Encounter (Signed)
Pt state that Dr. Macario GoldsPlot to check to see whats going on with her stomach, pt call back and stated she changed her mind and do not want to get it check anymore. FYI

## 2015-12-23 NOTE — Telephone Encounter (Signed)
Noted. Thx.

## 2015-12-28 ENCOUNTER — Ambulatory Visit (INDEPENDENT_AMBULATORY_CARE_PROVIDER_SITE_OTHER): Payer: Commercial Managed Care - HMO | Admitting: Gastroenterology

## 2015-12-28 ENCOUNTER — Encounter: Payer: Self-pay | Admitting: Gastroenterology

## 2015-12-28 VITALS — BP 110/70 | HR 68 | Ht 65.0 in | Wt 150.0 lb

## 2015-12-28 DIAGNOSIS — K625 Hemorrhage of anus and rectum: Secondary | ICD-10-CM

## 2015-12-28 DIAGNOSIS — R1084 Generalized abdominal pain: Secondary | ICD-10-CM

## 2015-12-28 DIAGNOSIS — K5900 Constipation, unspecified: Secondary | ICD-10-CM | POA: Diagnosis not present

## 2015-12-28 NOTE — Patient Instructions (Addendum)
Constipation: Increase your intake of water, fruits/vegetables and your activity level.  Miralax powder: 1 capful in a glass of water or juice once daily.  Ducosate stool softener: 100 mg capsule twice daily  You have been scheduled for a colonoscopy. Please follow written instructions given to you at your visit today.  Please pick up your prep supplies at the pharmacy within the next 1-3 days. If you use inhalers (even only as needed), please bring them with you on the day of your procedure. Your physician has requested that you go to www.startemmi.com and enter the access code given to you at your visit today. This web site gives a general overview about your procedure. However, you should still follow specific instructions given to you by our office regarding your preparation for the procedure.   If you are age 75 or older, your body mass index should be between 23-30. Your Body mass index is 24.96 kg/(m^2). If this is out of the aforementioned range listed, please consider follow up with your Primary Care Provider.  If you are age 75 or younger, your body mass index should be between 19-25. Your Body mass index is 24.96 kg/(m^2). If this is out of the aformentioned range listed, please consider follow up with your Primary Care Provider.   Thank you for choosing [facility]  Dr Amada JupiterHenry Danis III

## 2015-12-28 NOTE — Progress Notes (Signed)
[facility] Consult Note:  History: Tracy [name] 12/28/2015  Referring physician: Sonda Primes, MD  Reason for consult/chief complaint: Constipation   Subjective HPI:  Tracy [name] was seen in office consult today for constipation and rectal bleeding. She reports being referred by her PCP, though I cannot see her recent note from them. She is a very limited historian, complaining of some vague mid abdominal pain that "comes and goes".  She also might go up to 2 weeks without a bowel movement, and the stool is hard with a need to strain, and sometimes there is bleeding. Her appetite is good and her weight reportedly stable. Beyond that, I cannot characterize her symptoms any further.   ROS:  Review of Systems  Constitutional: Negative for appetite change and unexpected weight change.  HENT: Negative for mouth sores and voice change.   Eyes: Negative for pain and redness.  Respiratory: Negative for cough and shortness of breath.   Cardiovascular: Negative for chest pain and palpitations.  Genitourinary: Negative for dysuria and hematuria.  Musculoskeletal: Positive for arthralgias. Negative for myalgias.  Skin: Negative for pallor and rash.  Neurological: Negative for weakness and headaches.  Hematological: Negative for adenopathy.   Memory difficulty  Past Medical History: Past Medical History  Diagnosis Date  . Diabetes mellitus without complication ([name])   . Hypertension   . Stroke ([name])   . Acute cholecystitis 08/10/2013    Lap chole on 08/13/13   . Depression   . Anxiety   . Restless leg syndrome      Past Surgical History: Past Surgical History  Procedure Laterality Date  . Ercp N/A 08/12/2013    Procedure: ENDOSCOPIC RETROGRADE CHOLANGIOPANCREATOGRAPHY (ERCP);  Surgeon: Theda Belfast, MD;  Location: [facility] ENDOSCOPY;  Service: Endoscopy;  Laterality: N/A;  . Sphincterotomy  08/12/2013    Procedure: SPHINCTEROTOMY;  Surgeon: Theda Belfast, MD;   Location: [facility] ENDOSCOPY;  Service: Endoscopy;;  . Cholecystectomy N/A 08/13/2013    Procedure: LAPAROSCOPIC CHOLECYSTECTOMY ;  Surgeon: Currie Paris, MD;  Location: [facility] OR;  Service: General;  Laterality: N/A;  . Incisional hernia repair N/A 08/20/2013    Procedure: HERNIA REPAIR INCISIONAL UMBILICAL;  Surgeon: Velora Heckler, MD;  Location: [facility] OR;  Service: General;  Laterality: N/A;  . Ep implantable device N/A 06/02/2015    Procedure: Loop Recorder Insertion;  Surgeon: Duke Salvia, MD;  Location: [facility] INVASIVE CV LAB;  Service: Cardiovascular;  Laterality: N/A;     Family History: Family History  Problem Relation Age of Onset  . Stroke Mother     Social History: Social History   Social History  . Marital Status: Married    Spouse Name: N/A  . Number of Children: 7  . Years of Education: 10   Occupational History  . retired     Designer, fashion/clothing   Social History Main Topics  . Smoking status: Never Smoker   . Smokeless tobacco: None  . Alcohol Use: No  . Drug Use: No  . Sexual Activity: Not Asked   Other Topics Concern  . None   Social History Narrative   Married, 7 children, lives with son   Retired from [employer] work   right handed   Caffeine use -  1 soda daily, tea twice weekly    Allergies: Allergies  Allergen Reactions  . Wellbutrin [Bupropion] Other (See Comments)    Wt loss    Outpatient Meds: Current Outpatient Prescriptions  Medication Sig Dispense Refill  . acetaminophen (TYLENOL) 325  MG tablet Take 650 mg by mouth every 6 (six) hours as needed for pain.    Marland Kitchen aspirin EC 81 MG tablet Take 81 mg by mouth daily.    . cholecalciferol (VITAMIN D) 1000 UNITS tablet Take 1,000 Units by mouth daily.    . CVS VITAMIN B12 1000 MCG tablet TAKE 1 TABLET (1,000 MCG TOTAL) BY MOUTH DAILY. 30 tablet 5  . donepezil (ARICEPT) 5 MG tablet TAKE 1 TABLET BY MOUTH AT BEDTIME 30 tablet 11  . Emollient (CETAPHIL DAILY ADVANCE) LOTN Use bid (Patient taking differently: Apply  1 application topically 2 (two) times daily. Use bid) 473 g 2  . escitalopram (LEXAPRO) 5 MG tablet Take 1 tablet (5 mg total) by mouth daily. 90 tablet 1  . glucose blood test strip One touch Verio Flex.  Use as instructed to test sugars daily and prn.  E11.59 100 each 12  . Ibuprofen-Famotidine (DUEXIS) 800-26.6 MG TABS Take 1 tablet by mouth 2 (two) times daily as needed. 9 tablet 0  . lubiprostone (AMITIZA) 24 MCG capsule 1 po qd or bid prn constipation (Patient taking differently: Take 24 mcg by mouth 2 (two) times daily as needed for constipation. ) 60 capsule 5  . metFORMIN (GLUCOPHAGE) 500 MG tablet TAKE 1 TABLET (500 MG TOTAL) BY MOUTH DAILY WITH BREAKFAST. 30 tablet 0  . spironolactone (ALDACTONE) 25 MG tablet Take 1 tablet (25 mg total) by mouth daily. 30 tablet 11  . spironolactone (ALDACTONE) 25 MG tablet TAKE 1 TABLET (25 MG TOTAL) BY MOUTH DAILY. 30 tablet 11   No current facility-administered medications for this visit.      ___________________________________________________________________ Objective  Exam:  BP 110/70 mmHg  Pulse 68  Ht  (1.651 m)  Wt 150 lb (68.04 kg)  BMI 24.96 kg/m2   General: this is a(n) Elderly female, not acutely ill-appearing   Eyes: sclera anicteric, no redness  ENT: oral mucosa moist without lesions, no cervical or supraclavicular lymphadenopathy, poor dentition  CV: RRR with a soft systolic murmur, Z6/X0, no JVD, no peripheral edema  Resp: clear to auscultation bilaterally, normal RR and effort noted  GI: soft, mild periumbilical tenderness, with active bowel sounds. No guarding or palpable organomegaly noted.  Skin; warm and dry, no rash or jaundice noted  Neuro: awake, alert and oriented x 3. Normal gross motor function and fluent speech  Labs:  Lab Results  Component Value Date   WBC 6.5 12/15/2015   HGB 11.8* 12/15/2015   HCT 35.9* 12/15/2015   MCV 91.6 12/15/2015   PLT 312 12/15/2015   Calcium nml, no recent  TSH  Radiologic Studies:  No recent abdominal imaging Assessment: Encounter Diagnoses  Name Primary?  . Constipation, unspecified constipation type Yes  . Rectal bleeding   . Generalized abdominal pain     Symptoms all seem most likely related to constipation, perhaps as a side effect from medicines or age-related decline in motility. Nevertheless, she reports that it only started several months ago and there is also bleeding, so we must rule out malignancy.  Plan:  Ducosate and MiraLAX bowel regimen  Colonoscopy  The benefits and risks of the planned procedure were described in detail with the patient or (when appropriate) their health care proxy.  Risks were outlined as including, but not limited to, bleeding, infection, perforation, adverse medication reaction leading to cardiac or pulmonary decompensation, or pancreatitis (if ERCP).  The limitation of incomplete mucosal visualization was also discussed.  No guarantees or warranties  were given.   Thank you for the courtesy of this consult.  Please call me with any questions or concerns.  Charlie PitterHenry L Danis III

## 2015-12-29 ENCOUNTER — Ambulatory Visit (INDEPENDENT_AMBULATORY_CARE_PROVIDER_SITE_OTHER): Payer: Commercial Managed Care - HMO | Admitting: *Deleted

## 2015-12-29 DIAGNOSIS — I639 Cerebral infarction, unspecified: Secondary | ICD-10-CM

## 2015-12-29 NOTE — Progress Notes (Signed)
Carelink Summary Report / Loop Recorder 

## 2016-01-10 LAB — HM DIABETES EYE EXAM

## 2016-01-11 ENCOUNTER — Ambulatory Visit (AMBULATORY_SURGERY_CENTER): Payer: Commercial Managed Care - HMO | Admitting: Gastroenterology

## 2016-01-11 ENCOUNTER — Encounter: Payer: Self-pay | Admitting: Gastroenterology

## 2016-01-11 VITALS — BP 107/67 | HR 76 | Temp 98.0°F | Resp 11 | Ht 65.0 in | Wt 150.0 lb

## 2016-01-11 DIAGNOSIS — K5909 Other constipation: Secondary | ICD-10-CM | POA: Diagnosis present

## 2016-01-11 DIAGNOSIS — K625 Hemorrhage of anus and rectum: Secondary | ICD-10-CM

## 2016-01-11 DIAGNOSIS — Z1211 Encounter for screening for malignant neoplasm of colon: Secondary | ICD-10-CM | POA: Diagnosis not present

## 2016-01-11 LAB — GLUCOSE, CAPILLARY
GLUCOSE-CAPILLARY: 159 mg/dL — AB (ref 65–99)
Glucose-Capillary: 68 mg/dL (ref 65–99)

## 2016-01-11 MED ORDER — SODIUM CHLORIDE 0.9 % IV SOLN: 500.0000 mL | INTRAVENOUS | Status: DC

## 2016-01-11 NOTE — Patient Instructions (Signed)
YOU HAD AN ENDOSCOPIC PROCEDURE TODAY AT THE [facility]:   Refer to the procedure report that was given to you for any specific questions about what was found during the examination.  If the procedure report does not answer your questions, please call your gastroenterologist to clarify.  If you requested that your care partner not be given the details of your procedure findings, then the procedure report has been included in a sealed envelope for you to review at your convenience later.  YOU SHOULD EXPECT: Some feelings of bloating in the abdomen. Passage of more gas than usual.  Walking can help get rid of the air that was put into your GI tract during the procedure and reduce the bloating. If you had a lower endoscopy (such as a colonoscopy or flexible sigmoidoscopy) you may notice spotting of blood in your stool or on the toilet paper. If you underwent a bowel prep for your procedure, you may not have a normal bowel movement for a few days.  Please Note:  You might notice some irritation and congestion in your nose or some drainage.  This is from the oxygen used during your procedure.  There is no need for concern and it should clear up in a day or so.  SYMPTOMS TO REPORT IMMEDIATELY:   Following lower endoscopy (colonoscopy or flexible sigmoidoscopy):  Excessive amounts of blood in the stool  Significant tenderness or worsening of abdominal pains  Swelling of the abdomen that is new, acute  Fever of 100F or higher    For urgent or emergent issues, a gastroenterologist can be reached at any hour by calling [phone].   DIET: Your first meal following the procedure should be a small meal and then it is ok to progress to your normal diet. Heavy or fried foods are harder to digest and may make you feel nauseous or bloated.  Likewise, meals heavy in dairy and vegetables can increase bloating.  Drink plenty of fluids but you should avoid alcoholic beverages for 24  hours.  ACTIVITY:  You should plan to take it easy for the rest of today and you should NOT DRIVE or use heavy machinery until tomorrow (because of the sedation medicines used during the test).    FOLLOW UP: Our staff will call the number listed on your records the next business day following your procedure to check on you and address any questions or concerns that you may have regarding the information given to you following your procedure. If we do not reach you, we will leave a message.  However, if you are feeling well and you are not experiencing any problems, there is no need to return our call.  We will assume that you have returned to your regular daily activities without incident.  If any biopsies were taken you will be contacted by phone or by letter within the next 1-3 weeks.  Please call us at [phone] if you have not heard about the biopsies in 3 weeks.    SIGNATURES/CONFIDENTIALITY: You and/or your care partner have signed paperwork which will be entered into your electronic medical record.  These signatures attest to the fact that that the information above on your After Visit Summary has been reviewed and is understood.  Full responsibility of the confidentiality of this discharge information lies with you and/or your care-partner.   Resume medications. Information given on diverticulosis,hemorrhoids and high fiber diet. 

## 2016-01-11 NOTE — Progress Notes (Signed)
A and Ox 3 Report to RN 

## 2016-01-11 NOTE — Op Note (Signed)
[facility] Patient Name: Tracy [name] Procedure Date: 01/11/2016 10:37 AM MRN: [id number] Endoscopist: Sherilyn Cooter L. Myrtie Neither , MD Age: 75 Date of Birth: [birth year] Gender: Female Procedure:                Colonoscopy Indications:              Periumbilical abdominal pain, Rectal bleeding,                            Constipation Medicines:                Monitored Anesthesia Care Procedure:                Pre-Anesthesia Assessment:                           - Prior to the procedure, a History and Physical                            was performed, and patient medications and                            allergies were reviewed. The patient's tolerance of                            previous anesthesia was also reviewed. The risks                            and benefits of the procedure and the sedation                            options and risks were discussed with the patient.                            All questions were answered, and informed consent                            was obtained. Prior Anticoagulants: The patient has                            taken no previous anticoagulant or antiplatelet                            agents. ASA Grade Assessment: III - A patient with                            severe systemic disease. After reviewing the risks                            and benefits, the patient was deemed in                            satisfactory condition to undergo the procedure.                           After  obtaining informed consent, the colonoscope                            was passed under direct vision. Throughout the                            procedure, the patient's blood pressure, pulse, and                            oxygen saturations were monitored continuously. The                            Model CF-HQ190L [id number]) scope was introduced                            through the anus and advanced to the the cecum,   identified by appendiceal orifice and ileocecal                            valve. The colonoscopy was performed without                            difficulty. The patient tolerated the procedure                            well. The quality of the bowel preparation was                            good. The ileocecal valve, appendiceal orifice, and                            rectum were photographed. Scope In: 11:49:48 AM Scope Out: 12:03:16 PM Scope Withdrawal Time: 0 hours 6 minutes 49 seconds  Total Procedure Duration: 0 hours 13 minutes 28 seconds  Findings:                 The digital rectal exam findings include decreased                            sphincter tone.                           Internal hemorrhoids were found during                            retroflexion. The hemorrhoids were small and Grade                            I (internal hemorrhoids that do not prolapse).                           Multiple small-mouthed diverticula were found in                            the left colon.  The exam was otherwise without abnormality. Complications:            No immediate complications. Estimated Blood Loss:     Estimated blood loss: none. Impression:               - Decreased sphincter tone found on digital rectal                            exam.                           - Internal hemorrhoids, which account for reported                            rectal bleeding.                           - Diverticulosis in the left colon.                           - The examination was otherwise normal.                           - No specimens collected. Recommendation:           - Patient has a contact number available for                            emergencies. The signs and symptoms of potential                            delayed complications were discussed with the                            patient. Return to normal activities tomorrow.                             Written discharge instructions were provided to the                            patient.                           - Resume previous diet.                           - Continue present medications, including bowel                            regimen outlined during recent office visit.                           - No future routine colonoscopy due to age.                           - Return to GI clinic as needed. Henry L. Myrtie Neither, MD 01/11/2016 12:10:48 PM This report has been signed electronically.

## 2016-01-12 ENCOUNTER — Telehealth: Payer: Self-pay | Admitting: *Deleted

## 2016-01-12 NOTE — Telephone Encounter (Signed)
Called patient x two , telephone line busy.

## 2016-01-20 ENCOUNTER — Other Ambulatory Visit: Payer: Self-pay | Admitting: Family

## 2016-01-25 ENCOUNTER — Telehealth: Payer: Self-pay

## 2016-01-25 NOTE — Telephone Encounter (Signed)
I called pt to see if she has had a eye exam. She states she did approximately 3 months ago. I asked her for the name of the place where she had the exam. She was not sure and hung up.

## 2016-01-25 NOTE — Telephone Encounter (Signed)
Patient called and said that she got a call from us yesterday about she needs to send in a eye exam paper. She is confused on what she needs to know. If you could please call her and explain to her.

## 2016-01-26 ENCOUNTER — Telehealth: Payer: Self-pay | Admitting: Internal Medicine

## 2016-01-26 NOTE — Telephone Encounter (Signed)
Patient called to request that you give her a call. She would not give me what exactly she wanted, but it seemed to be about her eye?

## 2016-01-26 NOTE — Telephone Encounter (Signed)
Called pt back she stated she went to [facility] to have her eye exam wanting to verify iod MD received report. Inform pt per chart report has not been scan into chart yet but will let MD know everything was good. Will repeat in 1 year...Raechel Chute/lmb

## 2016-01-28 ENCOUNTER — Ambulatory Visit (INDEPENDENT_AMBULATORY_CARE_PROVIDER_SITE_OTHER): Payer: Commercial Managed Care - HMO | Admitting: *Deleted

## 2016-01-28 DIAGNOSIS — I639 Cerebral infarction, unspecified: Secondary | ICD-10-CM | POA: Diagnosis not present

## 2016-01-28 NOTE — Progress Notes (Signed)
Carelink Summary Report / Loop Recorder 

## 2016-02-05 LAB — CUP PACEART REMOTE DEVICE CHECK: MDC IDC SESS DTM: 20170320133646

## 2016-02-05 NOTE — Progress Notes (Signed)
Carelink summary report received. Battery status OK. Normal device function. No new symptom episodes, tachy episodes, brady, or pause episodes. No new AF episodes. Monthly summary reports and ROV/PRN 

## 2016-02-07 LAB — CUP PACEART REMOTE DEVICE CHECK: Date Time Interrogation Session: 20170419140636

## 2016-02-07 NOTE — Progress Notes (Signed)
Carelink summary report received. Battery status OK. Normal device function. No new symptom episodes, tachy episodes, brady, or pause episodes. No new AF episodes. Monthly summary reports and ROV/PRN 

## 2016-02-22 ENCOUNTER — Telehealth: Payer: Self-pay

## 2016-02-22 NOTE — Telephone Encounter (Signed)
Pt is supposed to take Lexapro for panic attacks prevention daily Thx

## 2016-02-22 NOTE — Telephone Encounter (Signed)
Pt is having panic attacks, said she has had these in the past and Dr. Posey ReaPlotnikov prescribed her something that helped, but she does not remember what it was. Would like something called in. [phone]

## 2016-02-22 NOTE — Telephone Encounter (Signed)
I took this message when I was helping with the phones this morning. Please notify patient of Dr. Loren RacerPlotnikov's response.

## 2016-02-23 NOTE — Telephone Encounter (Signed)
Called pt husband states she was not there to call back later on...Raechel Chute/lmb

## 2016-02-23 NOTE — Telephone Encounter (Signed)
Patient called back. I informed her of the notes. She was unaware she even took Lexapro. She states she is going to start taking it. If you feel you need to follow up, please call her.

## 2016-02-28 ENCOUNTER — Ambulatory Visit (INDEPENDENT_AMBULATORY_CARE_PROVIDER_SITE_OTHER): Payer: Commercial Managed Care - HMO | Admitting: *Deleted

## 2016-02-28 DIAGNOSIS — I639 Cerebral infarction, unspecified: Secondary | ICD-10-CM | POA: Diagnosis not present

## 2016-02-28 NOTE — Progress Notes (Signed)
Carelink Summary Report / Loop Recorder 

## 2016-03-06 ENCOUNTER — Encounter: Payer: Self-pay | Admitting: Internal Medicine

## 2016-03-06 ENCOUNTER — Ambulatory Visit (INDEPENDENT_AMBULATORY_CARE_PROVIDER_SITE_OTHER): Payer: Commercial Managed Care - HMO | Admitting: Internal Medicine

## 2016-03-06 VITALS — BP 118/62 | HR 74 | Wt 149.0 lb

## 2016-03-06 DIAGNOSIS — E1159 Type 2 diabetes mellitus with other circulatory complications: Secondary | ICD-10-CM

## 2016-03-06 DIAGNOSIS — F0390 Unspecified dementia without behavioral disturbance: Secondary | ICD-10-CM | POA: Diagnosis not present

## 2016-03-06 DIAGNOSIS — I10 Essential (primary) hypertension: Secondary | ICD-10-CM | POA: Diagnosis not present

## 2016-03-06 DIAGNOSIS — F411 Generalized anxiety disorder: Secondary | ICD-10-CM

## 2016-03-06 DIAGNOSIS — B351 Tinea unguium: Secondary | ICD-10-CM | POA: Insufficient documentation

## 2016-03-06 LAB — CUP PACEART REMOTE DEVICE CHECK
MDC IDC SESS DTM: 20170519143644
MDC IDC SESS DTM: 20170618143633

## 2016-03-06 MED ORDER — SPIRONOLACTONE 25 MG PO TABS: ORAL_TABLET | ORAL | Status: DC

## 2016-03-06 MED ORDER — ESCITALOPRAM OXALATE 10 MG PO TABS: 10.0000 mg | ORAL_TABLET | Freq: Every day | ORAL | Status: DC

## 2016-03-06 MED ORDER — ESCITALOPRAM OXALATE 10 MG PO TABS
10.0000 mg | ORAL_TABLET | Freq: Every day | ORAL | Status: DC
Start: 2016-03-06 — End: 2016-03-06

## 2016-03-06 MED ORDER — DONEPEZIL HCL 5 MG PO TABS: 5.0000 mg | ORAL_TABLET | Freq: Every day | ORAL | Status: DC

## 2016-03-06 NOTE — Assessment & Plan Note (Signed)
painful toes toenlails are thick, toes are crossed; [facility]

## 2016-03-06 NOTE — Assessment & Plan Note (Signed)
On spironolactone.  

## 2016-03-06 NOTE — Assessment & Plan Note (Signed)
On Metformin 

## 2016-03-06 NOTE — Assessment & Plan Note (Signed)
On Aricept 

## 2016-03-06 NOTE — Assessment & Plan Note (Signed)
Increase Lexapro to 10 mg/d 

## 2016-03-06 NOTE — Progress Notes (Signed)
Pre visit review using our clinic review tool, if applicable. No additional management support is needed unless otherwise documented below in the visit note. 

## 2016-03-06 NOTE — Progress Notes (Signed)
Subjective:  Patient ID: Tracy [name], female    DOB: 03/18/1941  Age: 75 y.o. MRN: [id number]  CC: No chief complaint on file.   HPI Tracy [name] presents for HTN, anxiety, DM2 f/u C/o toenlails being thick, toes are crossed  Outpatient Prescriptions Prior to Visit  Medication Sig Dispense Refill  . acetaminophen (TYLENOL) 325 MG tablet Take 650 mg by mouth every 6 (six) hours as needed for pain.    Marland Kitchen. aspirin EC 81 MG tablet Take 81 mg by mouth daily.    . cholecalciferol (VITAMIN D) 1000 UNITS tablet Take 1,000 Units by mouth daily.    . [facility]S VITAMIN B12 1000 MCG tablet TAKE 1 TABLET (1,000 MCG TOTAL) BY MOUTH DAILY. 30 tablet 5  . donepezil (ARICEPT) 5 MG tablet TAKE 1 TABLET BY MOUTH AT BEDTIME 30 tablet 11  . Emollient (CETAPHIL DAILY ADVANCE) LOTN Use bid (Patient taking differently: Apply 1 application topically 2 (two) times daily. Use bid) 473 g 2  . escitalopram (LEXAPRO) 5 MG tablet Take 1 tablet (5 mg total) by mouth daily. 90 tablet 1  . glucose blood test strip One touch Verio Flex.  Use as instructed to test sugars daily and prn.  E11.59 100 each 12  . Ibuprofen-Famotidine (DUEXIS) 800-26.6 MG TABS Take 1 tablet by mouth 2 (two) times daily as needed. 9 tablet 0  . lubiprostone (AMITIZA) 24 MCG capsule 1 po qd or bid prn constipation (Patient taking differently: Take 24 mcg by mouth 2 (two) times daily as needed for constipation. ) 60 capsule 5  . metFORMIN (GLUCOPHAGE) 500 MG tablet TAKE 1 TABLET (500 MG TOTAL) BY MOUTH DAILY WITH BREAKFAST. 90 tablet 1  . spironolactone (ALDACTONE) 25 MG tablet TAKE 1 TABLET (25 MG TOTAL) BY MOUTH DAILY. 30 tablet 11   No facility-administered medications prior to visit.    ROS Review of Systems  Constitutional: Negative for chills, activity change, appetite change, fatigue and unexpected weight change.  HENT: Negative for congestion, mouth sores and sinus pressure.   Eyes: Negative for visual disturbance.  Respiratory:  Negative for cough and chest tightness.   Gastrointestinal: Negative for nausea and abdominal pain.  Genitourinary: Negative for frequency, difficulty urinating and vaginal pain.  Musculoskeletal: Positive for gait problem. Negative for back pain.  Skin: Negative for pallor and rash.  Neurological: Negative for dizziness, tremors, weakness, numbness and headaches.  Psychiatric/Behavioral: Positive for decreased concentration. Negative for confusion and sleep disturbance. The patient is nervous/anxious.     Objective:  BP 118/62 mmHg  Pulse 74  Wt 149 lb (67.586 kg)  SpO2 96%  BP Readings from Last 3 Encounters:  03/06/16 118/62  01/11/16 107/67  12/28/15 110/70    Wt Readings from Last 3 Encounters:  03/06/16 149 lb (67.586 kg)  01/11/16 150 lb (68.04 kg)  12/28/15 150 lb (68.04 kg)    Physical Exam  Constitutional: She appears well-developed. No distress.  HENT:  Head: Normocephalic.  Right Ear: External ear normal.  Left Ear: External ear normal.  Nose: Nose normal.  Mouth/Throat: Oropharynx is clear and moist.  Eyes: Conjunctivae are normal. Pupils are equal, round, and reactive to light. Right eye exhibits no discharge. Left eye exhibits no discharge.  Neck: Normal range of motion. Neck supple. No JVD present. No tracheal deviation present. No thyromegaly present.  Cardiovascular: Normal rate, regular rhythm and normal heart sounds.   Pulmonary/Chest: No stridor. No respiratory distress. She has no wheezes.  Abdominal: Soft. Bowel sounds  are normal. She exhibits no distension and no mass. There is no tenderness. There is no rebound and no guarding.  Musculoskeletal: She exhibits no edema or tenderness.  Lymphadenopathy:    She has no cervical adenopathy.  Neurological: She displays normal reflexes. No cranial nerve deficit. She exhibits normal muscle tone. Coordination normal.  Skin: No rash noted. No erythema.  Psychiatric: Her behavior is normal. Judgment and  thought content normal.   toenlails are thick, toes are crossed; hammertoes   Lab Results  Component Value Date   WBC 6.5 12/15/2015   HGB 11.8* 12/15/2015   HCT 35.9* 12/15/2015   PLT 312 12/15/2015   GLUCOSE 101* 12/15/2015   CHOL 129 10/27/2014   TRIG 82 10/27/2014   HDL 45 10/27/2014   LDLCALC 68 10/27/2014   ALT 8 11/30/2015   AST 13 11/30/2015   NA 141 12/15/2015   K 3.8 12/15/2015   CL 103 12/15/2015   CREATININE 1.02* 12/15/2015   BUN 20 12/15/2015   CO2 27 12/15/2015   TSH 4.305 10/28/2014   INR 1.12 02/05/2015   HGBA1C 5.9 11/30/2015    No results found.  Assessment & Plan:   There are no diagnoses linked to this encounter. I am having Ms. Corman maintain her acetaminophen, CETAPHIL DAILY ADVANCE, cholecalciferol, aspirin EC, lubiprostone, spironolactone, Ibuprofen-Famotidine, glucose blood, escitalopram, CVS VITAMIN B12, donepezil, and metFORMIN.  No orders of the defined types were placed in this encounter.     Follow-up: No Follow-up on file.  Sonda PrimesAlex Aloria Looper, MD

## 2016-03-28 ENCOUNTER — Ambulatory Visit (INDEPENDENT_AMBULATORY_CARE_PROVIDER_SITE_OTHER): Payer: Commercial Managed Care - HMO | Admitting: *Deleted

## 2016-03-28 DIAGNOSIS — I639 Cerebral infarction, unspecified: Secondary | ICD-10-CM | POA: Diagnosis not present

## 2016-03-28 NOTE — Progress Notes (Signed)
Carelink Summary Report / Loop Recorder 

## 2016-03-31 ENCOUNTER — Other Ambulatory Visit: Payer: Self-pay | Admitting: *Deleted

## 2016-03-31 MED ORDER — CYANOCOBALAMIN 1000 MCG PO TABS: ORAL_TABLET | ORAL | Status: DC

## 2016-04-04 ENCOUNTER — Other Ambulatory Visit: Payer: Self-pay | Admitting: *Deleted

## 2016-04-04 MED ORDER — CYANOCOBALAMIN 1000 MCG PO TABS: ORAL_TABLET | ORAL | 1 refills | Status: DC

## 2016-04-11 ENCOUNTER — Ambulatory Visit: Payer: Commercial Managed Care - HMO | Admitting: Podiatry

## 2016-04-20 ENCOUNTER — Ambulatory Visit (INDEPENDENT_AMBULATORY_CARE_PROVIDER_SITE_OTHER): Payer: Commercial Managed Care - HMO | Admitting: Podiatry

## 2016-04-20 ENCOUNTER — Encounter: Payer: Self-pay | Admitting: Podiatry

## 2016-04-20 VITALS — BP 114/65 | HR 69 | Resp 16 | Ht 65.0 in | Wt 149.0 lb

## 2016-04-20 DIAGNOSIS — E1140 Type 2 diabetes mellitus with diabetic neuropathy, unspecified: Secondary | ICD-10-CM

## 2016-04-20 DIAGNOSIS — Q828 Other specified congenital malformations of skin: Secondary | ICD-10-CM | POA: Diagnosis not present

## 2016-04-20 DIAGNOSIS — B351 Tinea unguium: Secondary | ICD-10-CM | POA: Diagnosis not present

## 2016-04-20 DIAGNOSIS — M79671 Pain in right foot: Secondary | ICD-10-CM

## 2016-04-20 DIAGNOSIS — M79672 Pain in left foot: Secondary | ICD-10-CM

## 2016-04-20 DIAGNOSIS — E1149 Type 2 diabetes mellitus with other diabetic neurological complication: Secondary | ICD-10-CM

## 2016-04-20 DIAGNOSIS — M79605 Pain in left leg: Secondary | ICD-10-CM

## 2016-04-20 DIAGNOSIS — M79604 Pain in right leg: Secondary | ICD-10-CM

## 2016-04-20 LAB — CUP PACEART REMOTE DEVICE CHECK: Date Time Interrogation Session: 20170718150720

## 2016-04-20 NOTE — Progress Notes (Signed)
   Subjective:    Patient ID: Lavon PaganiniYvonne D Kampe, female    DOB: 11/17/1940, 75 y.o.   MRN: [id number]  HPI Chief Complaint  Patient presents with  . Nail Problem    Bilatreral; nail discoloration & thickened nails; pt diabetic type 2; sugar=did not check today; A1C=5.9  . Debridement    Bilateral nail trim  . Callouses    Bilateral; great toe-medial side     Review of Systems  All other systems reviewed and are negative.      Objective:   Physical Exam        Assessment & Plan:

## 2016-04-21 NOTE — Progress Notes (Signed)
Subjective:     Patient ID: Tracy PaganiniYvonne D [name], female   DOB: 03/16/1941, 75 y.o.   MRN: [id number]  HPI patient presents with chronic nail disease 1-5 both feet that are painful when pressed lesions on the big toe left over right long-term diabetes that is under reasonably good control   Review of Systems  All other systems reviewed and are negative.      Objective:   Physical Exam  Constitutional: She is oriented to person, place, and time.  Cardiovascular: Intact distal pulses.   Musculoskeletal: Normal range of motion.  Neurological: She is oriented to person, place, and time.  Skin: Skin is warm and dry.  Nursing note and vitals reviewed.  neurovascular status found to be mildly diminished but intact with diminished sharp Dole vibratory noted. Patient's found to have good digital perfusion is well oriented 3 with thick yellow brittle nailbeds 1-5 both feet thick painful in the corner with keratotic lesion on the medial side the left hallux with lucent core. It is painful when pressed and patient is at risk of trimming herself     Assessment:     At risk diabetic with moderate neurological disease with mycotic nail infection 1-5 both feet and lesion of the left hallux over right    Plan:     H&P diabetic education rendered and debrided nailbeds 1-5 both feet and lesion left with no iatrogenic bleeding and discussed again importance of daily inspections. Reappoint to recheck

## 2016-04-24 ENCOUNTER — Telehealth: Payer: Self-pay | Admitting: Emergency Medicine

## 2016-04-24 NOTE — Telephone Encounter (Signed)
Pt called and would like you to call her back about her throat.

## 2016-04-25 ENCOUNTER — Encounter: Payer: Self-pay | Admitting: Family

## 2016-04-25 ENCOUNTER — Ambulatory Visit (INDEPENDENT_AMBULATORY_CARE_PROVIDER_SITE_OTHER): Payer: Commercial Managed Care - HMO | Admitting: Family

## 2016-04-25 DIAGNOSIS — K2100 Gastro-esophageal reflux disease with esophagitis, without bleeding: Secondary | ICD-10-CM

## 2016-04-25 DIAGNOSIS — K219 Gastro-esophageal reflux disease without esophagitis: Secondary | ICD-10-CM | POA: Insufficient documentation

## 2016-04-25 NOTE — Patient Instructions (Addendum)
Thank you for choosing [facility].  Summary/Instructions:  Please continue to take your Nexium.  Your prescription(s) have been submitted to your pharmacy or been printed and provided for you. Please take as directed and contact our office if you believe you are having problem(s) with the medication(s) or have any questions.  If your symptoms worsen or fail to improve, please contact our office for further instruction, or in case of emergency go directly to the emergency room at the closest medical facility.    Food Choices for Gastroesophageal Reflux Disease, Adult When you have gastroesophageal reflux disease (GERD), the foods you eat and your eating habits are very important. Choosing the right foods can help ease the discomfort of GERD. WHAT GENERAL GUIDELINES DO I NEED TO FOLLOW?  Choose fruits, vegetables, whole grains, low-fat dairy products, and low-fat meat, fish, and poultry.  Limit fats such as oils, salad dressings, butter, nuts, and avocado.  Keep a food diary to identify foods that cause symptoms.  Avoid foods that cause reflux. These may be different for different people.  Eat frequent small meals instead of three large meals each day.  Eat your meals slowly, in a relaxed setting.  Limit fried foods.  Cook foods using methods other than frying.  Avoid drinking alcohol.  Avoid drinking large amounts of liquids with your meals.  Avoid bending over or lying down until 2-3 hours after eating. WHAT FOODS ARE NOT RECOMMENDED? The following are some foods and drinks that may worsen your symptoms: Vegetables Tomatoes. Tomato juice. Tomato and spaghetti sauce. Chili peppers. Onion and garlic. Horseradish. Fruits Oranges, grapefruit, and lemon (fruit and juice). Meats High-fat meats, fish, and poultry. This includes hot dogs, ribs, ham, sausage, salami, and bacon. Dairy Whole milk and chocolate milk. Sour cream. Cream. Butter. Ice cream. Cream cheese.   Beverages Coffee and tea, with or without caffeine. Carbonated beverages or energy drinks. Condiments Hot sauce. Barbecue sauce.  Sweets/Desserts Chocolate and cocoa. Donuts. Peppermint and spearmint. Fats and Oils High-fat foods, including [place] fries and potato chips. Other Vinegar. Strong spices, such as black pepper, white pepper, red pepper, cayenne, curry powder, cloves, ginger, and chili powder. The items listed above may not be a complete list of foods and beverages to avoid. Contact your dietitian for more information.   This information is not intended to replace advice given to you by your health care provider. Make sure you discuss any questions you have with your health care provider.   Document Released: 08/28/2005 Document Revised: 09/18/2014 Document Reviewed: 07/02/2013 Elsevier Interactive Patient Education [employer]2016 Elsevier Inc.

## 2016-04-25 NOTE — Progress Notes (Signed)
Subjective:    Patient ID: Tracy PaganiniYvonne D [name], female    DOB: 07/22/1941, 75 y.o.   MRN: [id number]  Chief Complaint  Patient presents with  . Gastroesophageal Reflux    thinks one of her medications is making her nervous, indigestion    HPI:  Tracy [name] is a 75 y.o. female who  has a past medical history of Acute cholecystitis (08/10/2013); Anxiety; Depression; Diabetes mellitus without complication ([name]); Hypertension; Restless leg syndrome; and Stroke ([name]). and presents today for an acute office visit.  This is a new problem. Associated symptom of heartburn has been going on for about 2 weeks. Denies any changes in medication. Modifying factors include Nexium which did help with her symptoms. Burning feeling sensation is located in her upper abdomen. Symptoms generally occur after eating. Aggravating factors include fried foods. Also has thickness feelings if fullness in her throat.   Allergies  Allergen Reactions  . Wellbutrin [Bupropion] Other (See Comments)    Wt loss     Current Outpatient Prescriptions on File Prior to Visit  Medication Sig Dispense Refill  . acetaminophen (TYLENOL) 325 MG tablet Take 650 mg by mouth every 6 (six) hours as needed for pain.    Marland Kitchen. aspirin EC 81 MG tablet Take 81 mg by mouth daily.    . cholecalciferol (VITAMIN D) 1000 UNITS tablet Take 1,000 Units by mouth daily.    . cyanocobalamin (CVS VITAMIN B12) 1000 MCG tablet TAKE 1 TABLET (1,000 MCG TOTAL) BY MOUTH DAILY. 90 tablet 1  . donepezil (ARICEPT) 5 MG tablet Take 1 tablet (5 mg total) by mouth at bedtime. 90 tablet 3  . Emollient (CETAPHIL DAILY ADVANCE) LOTN Use bid (Patient taking differently: Apply 1 application topically 2 (two) times daily. Use bid) 473 g 2  . escitalopram (LEXAPRO) 10 MG tablet Take 1 tablet (10 mg total) by mouth daily. 90 tablet 1  . glucose blood test strip One touch Verio Flex.  Use as instructed to test sugars daily and prn.  E11.59 100 each 12  .  Ibuprofen-Famotidine (DUEXIS) 800-26.6 MG TABS Take 1 tablet by mouth 2 (two) times daily as needed. 9 tablet 0  . lubiprostone (AMITIZA) 24 MCG capsule 1 po qd or bid prn constipation (Patient taking differently: Take 24 mcg by mouth 2 (two) times daily as needed for constipation. ) 60 capsule 5  . metFORMIN (GLUCOPHAGE) 500 MG tablet TAKE 1 TABLET (500 MG TOTAL) BY MOUTH DAILY WITH BREAKFAST. 90 tablet 1  . spironolactone (ALDACTONE) 25 MG tablet TAKE 1 TABLET (25 MG TOTAL) BY MOUTH DAILY. 90 tablet 3   No current facility-administered medications on file prior to visit.     Review of Systems  Constitutional: Negative for chills and fever.  Respiratory: Negative for chest tightness and shortness of breath.   Cardiovascular: Negative for chest pain, palpitations and leg swelling.      Objective:    BP 120/70 (BP Location: Left Arm, Patient Position: Sitting, Cuff Size: Normal)   Pulse 93   Temp 97.8 F (36.6 C) (Oral)   Resp 16   Ht 5\' 5"  (1.651 m)   Wt 153 lb (69.4 kg)   SpO2 98%   BMI 25.46 kg/m  Nursing note and vital signs reviewed.  Physical Exam  Constitutional: She is oriented to person, place, and time. She appears well-developed and well-nourished. No distress.  Cardiovascular: Normal rate, regular rhythm, normal heart sounds and intact distal pulses.   Pulmonary/Chest: Effort normal and breath  sounds normal.  Abdominal: Normal appearance and bowel sounds are normal. She exhibits no mass. There is no hepatosplenomegaly. There is no tenderness. There is no rebound, no guarding, no tenderness at McBurney's point and negative Murphy's sign.  Neurological: She is alert and oriented to person, place, and time.  Skin: Skin is warm and dry.  Psychiatric: She has a normal mood and affect. Her behavior is normal. Judgment and thought content normal.       Assessment & Plan:   Problem List Items Addressed This Visit      Digestive   GERD (gastroesophageal reflux disease)      Symptoms and exam consistent with gastroesophageal reflux most likely related to dietary sources. Continue Nexium. Food choices for gastroesophageal reflux provided. Follow-up if symptoms worsen or do not improve.       Other Visit Diagnoses   None.      I am having [name] maintain her acetaminophen, CETAPHIL DAILY ADVANCE, cholecalciferol, aspirin EC, lubiprostone, Ibuprofen-Famotidine, glucose blood, metFORMIN, escitalopram, donepezil, spironolactone, and cyanocobalamin.   Follow-up: Return if symptoms worsen or fail to improve.  Jeanine Luzalone, Kayren Holck, FNP

## 2016-04-25 NOTE — Assessment & Plan Note (Signed)
Symptoms and exam consistent with gastroesophageal reflux most likely related to dietary sources. Continue Nexium. Food choices for gastroesophageal reflux provided. Follow-up if symptoms worsen or do not improve.

## 2016-04-27 ENCOUNTER — Ambulatory Visit (INDEPENDENT_AMBULATORY_CARE_PROVIDER_SITE_OTHER): Payer: Commercial Managed Care - HMO | Admitting: *Deleted

## 2016-04-27 DIAGNOSIS — I638 Other cerebral infarction: Secondary | ICD-10-CM | POA: Diagnosis not present

## 2016-04-27 DIAGNOSIS — I6389 Other cerebral infarction: Secondary | ICD-10-CM

## 2016-04-27 NOTE — Progress Notes (Signed)
Carelink Summary Report / Loop Recorder 

## 2016-05-16 ENCOUNTER — Telehealth: Payer: Self-pay | Admitting: Internal Medicine

## 2016-05-16 NOTE — Telephone Encounter (Signed)
TELEPHONE ADVICE RECORD [facility]  Patient Name: Tracy [name]  DOB: 01/24/1941    Initial Comment Caller states she is having reaction to medication, makes her feel hot all over.   Nurse Assessment  Nurse: Odis LusterBowers, RN, Bjorn Loserhonda Date/Time (Eastern Time): 05/16/2016 1:17:05 PM  Confirm and document reason for call. If symptomatic, describe symptoms. You must click the next button to save text entered. ---Caller states she is having reaction to medication, makes her feel hot all over. Reports she is taking something for indigestion, Nexium. Reports that she took 1 week with no improvement. Reports burning with urination. Denies fever.  Has the patient traveled out of the country within the last 30 days? ---Not Applicable  Does the patient have any new or worsening symptoms? ---Yes  Will a triage be completed? ---Yes  Related visit to physician within the last 2 weeks? ---Yes  Does the PT have any chronic conditions? (i.e. diabetes, asthma, etc.) ---Yes  List chronic conditions. ---indigestion, HTN, diabetic  Is this a behavioral health or substance abuse call? ---No     Guidelines    Guideline Title Affirmed Question Affirmed Notes  Urination Pain - Female Diabetes mellitus or weak immune system (e.g., HIV positive, cancer chemotherapy, transplant patient)    Final Disposition User   See Physician within 4 Hours (or PCP triage) Odis LusterBowers, RN, Rhonda    Comments  Caller unable to find transportation for appt today, agreed to see MD in am, [facility] 11am appt with Dr. Posey ReaPlotnikov. Caller voiced understanding.   Referrals  REFERRED TO PCP OFFICE   Disagree/Comply: Disagree  Disagree/Comply Reason: Unable to find transportation

## 2016-05-17 ENCOUNTER — Other Ambulatory Visit (INDEPENDENT_AMBULATORY_CARE_PROVIDER_SITE_OTHER): Payer: Commercial Managed Care - HMO

## 2016-05-17 ENCOUNTER — Encounter: Payer: Self-pay | Admitting: Internal Medicine

## 2016-05-17 ENCOUNTER — Ambulatory Visit (INDEPENDENT_AMBULATORY_CARE_PROVIDER_SITE_OTHER): Payer: Commercial Managed Care - HMO | Admitting: Internal Medicine

## 2016-05-17 VITALS — BP 130/70 | HR 78 | Temp 97.8°F | Wt 153.0 lb

## 2016-05-17 DIAGNOSIS — F0390 Unspecified dementia without behavioral disturbance: Secondary | ICD-10-CM | POA: Diagnosis not present

## 2016-05-17 DIAGNOSIS — K2100 Gastro-esophageal reflux disease with esophagitis, without bleeding: Secondary | ICD-10-CM

## 2016-05-17 DIAGNOSIS — E1159 Type 2 diabetes mellitus with other circulatory complications: Secondary | ICD-10-CM

## 2016-05-17 DIAGNOSIS — F411 Generalized anxiety disorder: Secondary | ICD-10-CM | POA: Diagnosis not present

## 2016-05-17 DIAGNOSIS — Z23 Encounter for immunization: Secondary | ICD-10-CM | POA: Diagnosis not present

## 2016-05-17 DIAGNOSIS — R202 Paresthesia of skin: Secondary | ICD-10-CM

## 2016-05-17 DIAGNOSIS — I6310 Cerebral infarction due to embolism of unspecified precerebral artery: Secondary | ICD-10-CM

## 2016-05-17 LAB — HEMOGLOBIN A1C: HEMOGLOBIN A1C: 5.8 % (ref 4.6–6.5)

## 2016-05-17 LAB — URINALYSIS, ROUTINE W REFLEX MICROSCOPIC
BILIRUBIN URINE: NEGATIVE
Ketones, ur: NEGATIVE
NITRITE: NEGATIVE
Specific Gravity, Urine: <=1.005 — AB (ref 1.000–1.030)
TOTAL PROTEIN, URINE-UPE24: NEGATIVE
Urine Glucose: NEGATIVE
Urobilinogen, UA: 1 (ref 0.0–1.0)
pH: 6 (ref 5.0–8.0)

## 2016-05-17 LAB — BASIC METABOLIC PANEL
BUN: 18 mg/dL (ref 6–23)
CHLORIDE: 107 meq/L (ref 96–112)
CO2: 30 mEq/L (ref 19–32)
Calcium: 9.6 mg/dL (ref 8.4–10.5)
Creatinine, Ser: 1.04 mg/dL (ref 0.40–1.20)
GFR: 66.47 mL/min (ref >60.00)
Glucose, Bld: 88 mg/dL (ref 70–99)
POTASSIUM: 4.6 meq/L (ref 3.5–5.1)
SODIUM: 144 meq/L (ref 135–145)

## 2016-05-17 LAB — HEPATIC FUNCTION PANEL
ALK PHOS: 59 U/L (ref 39–117)
ALT: 8 U/L (ref 0–35)
AST: 14 U/L (ref 0–37)
Albumin: 4.2 g/dL (ref 3.5–5.2)
BILIRUBIN TOTAL: 0.6 mg/dL (ref 0.2–1.2)
Bilirubin, Direct: 0.1 mg/dL (ref 0.0–0.3)
Total Protein: 7.2 g/dL (ref 6.0–8.3)

## 2016-05-17 LAB — CBC WITH DIFFERENTIAL/PLATELET
BASOS PCT: 0.8 % (ref 0.0–3.0)
Basophils Absolute: 0 10*3/uL (ref 0.0–0.1)
EOS ABS: 0.2 10*3/uL (ref 0.0–0.7)
EOS PCT: 3.2 % (ref 0.0–5.0)
HCT: 38.3 % (ref 36.0–46.0)
Hemoglobin: 12.9 g/dL (ref 12.0–15.0)
LYMPHS ABS: 2.9 10*3/uL (ref 0.7–4.0)
Lymphocytes Relative: 47.3 % — ABNORMAL HIGH (ref 12.0–46.0)
MCHC: 33.8 g/dL (ref 30.0–36.0)
MCV: 92.2 fl (ref 78.0–100.0)
MONO ABS: 0.3 10*3/uL (ref 0.1–1.0)
Monocytes Relative: 4.5 % (ref 3.0–12.0)
NEUTROS ABS: 2.7 10*3/uL (ref 1.4–7.7)
NEUTROS PCT: 44.2 % (ref 43.0–77.0)
PLATELETS: 312 10*3/uL (ref 150.0–400.0)
RBC: 4.16 Mil/uL (ref 3.87–5.11)
RDW: 14.8 % (ref 11.5–15.5)
WBC: 6 10*3/uL (ref 4.0–10.5)

## 2016-05-17 LAB — MICROALBUMIN / CREATININE URINE RATIO
CREATININE, U: 64.1 mg/dL
Microalb Creat Ratio: 1.1 mg/g (ref 0.0–30.0)

## 2016-05-17 MED ORDER — ESOMEPRAZOLE MAGNESIUM 40 MG PO CPDR: 40.0000 mg | DELAYED_RELEASE_CAPSULE | Freq: Every day | ORAL | 5 refills | Status: DC

## 2016-05-17 NOTE — Assessment & Plan Note (Signed)
ASA

## 2016-05-17 NOTE — Addendum Note (Signed)
Addended by: Merrilyn PumaSIMMONS, STACEY N on: 05/17/2016 11:52 AM   Modules accepted: Orders

## 2016-05-17 NOTE — Progress Notes (Signed)
Subjective:  Patient ID: Tracy [name], female    DOB: 08/07/1941  Age: 75 y.o. MRN: [id number]  CC: Abdominal Pain (described as a burning sensation x 2 weeks); Dizziness (x 2 weeks); and Nocturia   HPI Tracy [name] presents for a heartburn ?from Aricept; memory loss, anxiety, DM f/u.  Outpatient Medications Prior to Visit  Medication Sig Dispense Refill  . acetaminophen (TYLENOL) 325 MG tablet Take 650 mg by mouth every 6 (six) hours as needed for pain.    Marland Kitchen aspirin EC 81 MG tablet Take 81 mg by mouth daily.    . cholecalciferol (VITAMIN D) 1000 UNITS tablet Take 1,000 Units by mouth daily.    . cyanocobalamin (CVS VITAMIN B12) 1000 MCG tablet TAKE 1 TABLET (1,000 MCG TOTAL) BY MOUTH DAILY. 90 tablet 1  . donepezil (ARICEPT) 5 MG tablet Take 1 tablet (5 mg total) by mouth at bedtime. 90 tablet 3  . Emollient (CETAPHIL DAILY ADVANCE) LOTN Use bid (Patient taking differently: Apply 1 application topically 2 (two) times daily. Use bid) 473 g 2  . escitalopram (LEXAPRO) 10 MG tablet Take 1 tablet (10 mg total) by mouth daily. 90 tablet 1  . glucose blood test strip One touch Verio Flex.  Use as instructed to test sugars daily and prn.  E11.59 100 each 12  . Ibuprofen-Famotidine (DUEXIS) 800-26.6 MG TABS Take 1 tablet by mouth 2 (two) times daily as needed. 9 tablet 0  . lubiprostone (AMITIZA) 24 MCG capsule 1 po qd or bid prn constipation (Patient taking differently: Take 24 mcg by mouth 2 (two) times daily as needed for constipation. ) 60 capsule 5  . metFORMIN (GLUCOPHAGE) 500 MG tablet TAKE 1 TABLET (500 MG TOTAL) BY MOUTH DAILY WITH BREAKFAST. 90 tablet 1  . spironolactone (ALDACTONE) 25 MG tablet TAKE 1 TABLET (25 MG TOTAL) BY MOUTH DAILY. 90 tablet 3   No facility-administered medications prior to visit.     ROS Review of Systems  Constitutional: Negative for activity change, appetite change, chills, fatigue and unexpected weight change.  HENT: Negative for congestion,  mouth sores and sinus pressure.   Eyes: Negative for visual disturbance.  Respiratory: Negative for cough and chest tightness.   Gastrointestinal: Negative for abdominal pain and nausea.  Genitourinary: Negative for difficulty urinating, frequency and vaginal pain.  Musculoskeletal: Negative for back pain, gait problem and neck stiffness.  Skin: Negative for pallor and rash.  Neurological: Negative for dizziness, tremors, weakness, numbness and headaches.  Psychiatric/Behavioral: Negative for confusion and sleep disturbance. The patient is nervous/anxious.     Objective:  BP 130/70   Pulse 78   Temp 97.8 F (36.6 C) (Oral)   Wt 153 lb (69.4 kg)   SpO2 97%   BMI 25.46 kg/m   BP Readings from Last 3 Encounters:  05/17/16 130/70  04/25/16 120/70  04/20/16 114/65    Wt Readings from Last 3 Encounters:  05/17/16 153 lb (69.4 kg)  04/25/16 153 lb (69.4 kg)  04/20/16 149 lb (67.6 kg)    Physical Exam  Constitutional: She appears well-developed. No distress.  HENT:  Head: Normocephalic.  Right Ear: External ear normal.  Left Ear: External ear normal.  Nose: Nose normal.  Mouth/Throat: Oropharynx is clear and moist.  Eyes: Conjunctivae are normal. Pupils are equal, round, and reactive to light. Right eye exhibits no discharge. Left eye exhibits no discharge.  Neck: Normal range of motion. Neck supple. No JVD present. No tracheal deviation present. No thyromegaly present.  Cardiovascular: Normal rate, regular rhythm and normal heart sounds.   Pulmonary/Chest: No stridor. No respiratory distress. She has no wheezes.  Abdominal: Soft. Bowel sounds are normal. She exhibits no distension and no mass. There is no tenderness. There is no rebound and no guarding.  Musculoskeletal: She exhibits no edema or tenderness.  Lymphadenopathy:    She has no cervical adenopathy.  Neurological: She displays normal reflexes. No cranial nerve deficit. She exhibits normal muscle tone. Coordination  abnormal.  Skin: No rash noted. No erythema.  Psychiatric: Her behavior is normal. Judgment and thought content normal.  a little anxious  Lab Results  Component Value Date   WBC 6.5 12/15/2015   HGB 11.8 (L) 12/15/2015   HCT 35.9 (L) 12/15/2015   PLT 312 12/15/2015   GLUCOSE 101 (H) 12/15/2015   CHOL 129 10/27/2014   TRIG 82 10/27/2014   HDL 45 10/27/2014   LDLCALC 68 10/27/2014   ALT 8 11/30/2015   AST 13 11/30/2015   NA 141 12/15/2015   K 3.8 12/15/2015   CL 103 12/15/2015   CREATININE 1.02 (H) 12/15/2015   BUN 20 12/15/2015   CO2 27 12/15/2015   TSH 4.305 10/28/2014   INR 1.12 02/05/2015   HGBA1C 5.9 11/30/2015    No results found.  Assessment & Plan:   There are no diagnoses linked to this encounter. I am having Ms. Lamos maintain her acetaminophen, CETAPHIL DAILY ADVANCE, cholecalciferol, aspirin EC, lubiprostone, Ibuprofen-Famotidine, glucose blood, metFORMIN, escitalopram, donepezil, spironolactone, and cyanocobalamin.  No orders of the defined types were placed in this encounter.    Follow-up: No Follow-up on file.  Sonda PrimesAlex Chenell Lozon, MD

## 2016-05-17 NOTE — Assessment & Plan Note (Signed)
?  9/17 aggravated by Aricept - hold Aricept (Donepezil) for 1 week to see if better On Nexium

## 2016-05-17 NOTE — Patient Instructions (Signed)
Please hold Aricept (Donepezil) for 1 week to see if your "burning" gets better

## 2016-05-17 NOTE — Assessment & Plan Note (Signed)
On Lexapro 

## 2016-05-17 NOTE — Assessment & Plan Note (Signed)
On Metformin 

## 2016-05-17 NOTE — Assessment & Plan Note (Signed)
GERD: hold Aricept (Donepezil) for 1 week to see if better

## 2016-05-17 NOTE — Progress Notes (Signed)
Pre visit review using our clinic review tool, if applicable. No additional management support is needed unless otherwise documented below in the visit note. 

## 2016-05-18 LAB — VITAMIN B12: Vitamin B-12: >1500 pg/mL — ABNORMAL HIGH (ref 211–911)

## 2016-05-23 LAB — CUP PACEART REMOTE DEVICE CHECK: MDC IDC SESS DTM: 20170817153805

## 2016-05-25 ENCOUNTER — Telehealth: Payer: Self-pay | Admitting: Emergency Medicine

## 2016-05-25 NOTE — Telephone Encounter (Signed)
Pt called and wants you to give her a call back. Wouldn't give me any information said she wanted to talk to you about it. Thanks.

## 2016-05-25 NOTE — Telephone Encounter (Signed)
Pt called and stated she was suppose to call back and let Dr Macario GoldsPlot know how the medicine he prescribed was working. She states it is working well. She also wants to know if she is suppose to continue taking her nerve pills? Please advise thanks.

## 2016-05-25 NOTE — Telephone Encounter (Signed)
OK Use a nerve pill - Lexapro - as well Thx

## 2016-05-25 NOTE — Telephone Encounter (Signed)
Pt called to check up on this. Please call her back °

## 2016-05-26 NOTE — Telephone Encounter (Signed)
Duplicate msg see previous msg pt has been contacted...Tracy [name]/lmb

## 2016-05-26 NOTE — Telephone Encounter (Signed)
Notified pt w/MD response.../lmb 

## 2016-05-29 ENCOUNTER — Ambulatory Visit (INDEPENDENT_AMBULATORY_CARE_PROVIDER_SITE_OTHER): Payer: Commercial Managed Care - HMO | Admitting: *Deleted

## 2016-05-29 DIAGNOSIS — I638 Other cerebral infarction: Secondary | ICD-10-CM | POA: Diagnosis not present

## 2016-05-29 DIAGNOSIS — I6389 Other cerebral infarction: Secondary | ICD-10-CM

## 2016-05-29 NOTE — Progress Notes (Signed)
Carelink Summary Report / Loop Recorder 

## 2016-06-14 ENCOUNTER — Telehealth: Payer: Self-pay | Admitting: Internal Medicine

## 2016-06-14 NOTE — Telephone Encounter (Signed)
What med does cost too much? Thx

## 2016-06-14 NOTE — Telephone Encounter (Signed)
esomeprazole (NEXIUM) 40 MG   Patient called and stated this medication cost to much. She wanted to know if there was something else she could get. I informed her it may need a PA. Please follow up. Thank you.

## 2016-06-15 NOTE — Telephone Encounter (Signed)
I called pt- spoke to a female who states she isn't available. Will try again later.

## 2016-06-16 NOTE — Telephone Encounter (Signed)
I called pt back- she states she called the pharmacy and the cost dropped to $26. She went ahead and paid/filled the esomeprazole this time.

## 2016-06-19 ENCOUNTER — Encounter: Payer: Self-pay | Admitting: Internal Medicine

## 2016-06-19 ENCOUNTER — Encounter: Payer: Commercial Managed Care - HMO | Admitting: *Deleted

## 2016-06-22 LAB — CUP PACEART REMOTE DEVICE CHECK: MDC IDC SESS DTM: 20170916160712

## 2016-06-22 NOTE — Progress Notes (Signed)
Carelink summary report received. Battery status OK. Normal device function. No new symptom episodes, tachy episodes, brady, or pause episodes. No new AF episodes. Monthly summary reports and ROV/PRN 

## 2016-06-26 ENCOUNTER — Ambulatory Visit (INDEPENDENT_AMBULATORY_CARE_PROVIDER_SITE_OTHER): Payer: Commercial Managed Care - HMO | Admitting: *Deleted

## 2016-06-26 DIAGNOSIS — I6389 Other cerebral infarction: Secondary | ICD-10-CM

## 2016-06-26 DIAGNOSIS — I638 Other cerebral infarction: Secondary | ICD-10-CM | POA: Diagnosis not present

## 2016-06-27 NOTE — Progress Notes (Signed)
Carelink Summary Report / Loop Recorder 

## 2016-06-29 ENCOUNTER — Encounter: Payer: Commercial Managed Care - HMO | Admitting: Internal Medicine

## 2016-07-10 ENCOUNTER — Encounter: Payer: Self-pay | Admitting: Internal Medicine

## 2016-07-10 ENCOUNTER — Encounter (INDEPENDENT_AMBULATORY_CARE_PROVIDER_SITE_OTHER): Payer: Self-pay

## 2016-07-10 ENCOUNTER — Ambulatory Visit (INDEPENDENT_AMBULATORY_CARE_PROVIDER_SITE_OTHER): Payer: Commercial Managed Care - HMO | Admitting: Internal Medicine

## 2016-07-10 VITALS — BP 108/66 | HR 79 | Ht 66.0 in | Wt 156.6 lb

## 2016-07-10 DIAGNOSIS — I639 Cerebral infarction, unspecified: Secondary | ICD-10-CM | POA: Diagnosis not present

## 2016-07-10 NOTE — Progress Notes (Signed)
Patient Care Team: Tresa GarterAleksei V Plotnikov, MD as PCP - General (Internal Medicine)   HPI  Tracy [name] is a 75 y.o. female Seen in follow-up for cryptogenic stroke and is status post loop recorder insertion  Records and Results Reviewed Echocardiogram 2/16 normal LV function with LVH and normal left atrial size  Thromboembolic risk profile is notable for diabetes hypertension gender age and prior stroke No atrial fibrillation has heretofore been contacted  Past Medical History:  Diagnosis Date  . Acute cholecystitis 08/10/2013   Lap chole on 08/13/13   . Anxiety   . Depression   . Diabetes mellitus without complication ([name])   . Hypertension   . Restless leg syndrome   . Stroke [facility]([name])     Past Surgical History:  Procedure Laterality Date  . CHOLECYSTECTOMY N/A 08/13/2013   Procedure: LAPAROSCOPIC CHOLECYSTECTOMY ;  Surgeon: Currie Parishristian J Streck, MD;  [facility];  Service: General;  Laterality: N/A;  . EP IMPLANTABLE DEVICE N/A 06/02/2015   Procedure: Loop Recorder Insertion;  Surgeon: Duke SalviaSteven C Lurena Naeve, MD;  [facility];  Service: Cardiovascular;  Laterality: N/A;  . ERCP N/A 08/12/2013   Procedure: ENDOSCOPIC RETROGRADE CHOLANGIOPANCREATOGRAPHY (ERCP);  Surgeon: Theda BelfastPatrick D Hung, MD;  [facility];  Service: Endoscopy;  Laterality: N/A;  . INCISIONAL HERNIA REPAIR N/A 08/20/2013   Procedure: HERNIA REPAIR INCISIONAL UMBILICAL;  Surgeon: Velora Hecklerodd M Gerkin, MD;  [facility];  Service: General;  Laterality: N/A;  . SPHINCTEROTOMY  08/12/2013   Procedure: Dennison MascotSPHINCTEROTOMY;  Surgeon: Theda BelfastPatrick D Hung, MD;  [facility];  Service: Endoscopy;;    Current Outpatient Prescriptions  Medication Sig Dispense Refill  . acetaminophen (TYLENOL) 325 MG tablet Take 650 mg by mouth every 6 (six) hours as needed for pain.    Marland Kitchen. aspirin EC 81 MG tablet Take 81 mg by mouth daily.    . cetaphil (CETAPHIL) lotion Apply 1 application topically 2 (two) times  daily.    . cholecalciferol (VITAMIN D) 1000 UNITS tablet Take 1,000 Units by mouth daily.    . cyanocobalamin ([employer]S VITAMIN B12) 1000 MCG tablet TAKE 1 TABLET (1,000 MCG TOTAL) BY MOUTH DAILY. 90 tablet 1  . donepezil (ARICEPT) 5 MG tablet Take 1 tablet (5 mg total) by mouth at bedtime. 90 tablet 3  . escitalopram (LEXAPRO) 10 MG tablet Take 1 tablet (10 mg total) by mouth daily. 90 tablet 1  . esomeprazole (NEXIUM) 40 MG capsule Take 1 capsule (40 mg total) by mouth daily. 30 capsule 5  . glucose blood test strip One touch Verio Flex.  Use as instructed to test sugars daily and prn.  E11.59 100 each 12  . Ibuprofen-Famotidine 800-26.6 MG TABS Take 1 tablet by mouth 2 (two) times daily as needed (for pain).    . lubiprostone (AMITIZA) 24 MCG capsule Take 24 mcg by mouth 2 (two) times daily as needed for constipation.    . metFORMIN (GLUCOPHAGE) 500 MG tablet TAKE 1 TABLET (500 MG TOTAL) BY MOUTH DAILY WITH BREAKFAST. 90 tablet 1  . spironolactone (ALDACTONE) 25 MG tablet TAKE 1 TABLET (25 MG TOTAL) BY MOUTH DAILY. 90 tablet 3   No current facility-administered medications for this visit.     Allergies  Allergen Reactions  . Wellbutrin [Bupropion] Other (See Comments)    Wt loss      Review of Systems negative except from HPI and PMH  Physical Exam BP 108/66   Pulse 79   Ht  5\' 6"  (1.676 m)   Wt 156 lb 9.6 oz (71 kg)   SpO2 98%   BMI 25.28 kg/m  Well developed and well nourished in no acute distress HENT normal E scleral and icterus clear Neck Supple JVP flat; carotids brisk and full Clear to ausculation Regular rate and rhythm, no murmurs gallops or rub Soft with active bowel sounds No clubbing cyanosis edema Alert and oriented, grossly normal motor and sensory function Skin Warm and Dry  ECG demonstrates sinus rhythm at 71 Intervals 17/08/37 Otherwise normal  Assessment and  Plan  Hypertension  Cryptogenic stroke  Implantable loop recorder    Blood pressure  is well-controlled  Device demonstrates no interval atrial fibrillation  No interval tachycardia to explain her palpitations  Lengthy discussion responding to her question as to whether she could have loop recorder removed. Reviewing the data from [facility], there is a 8% per year likelihood of atrial fibrillation being detected in the cohort.  As such, at that time, aspirin would be inappropriate and anticoagulation would be indicated. I went over this in a number of ways. I think it was clear at the end.  We spent more than 50% of our >25 min visit in face to face counseling regarding the above   For now we will continue current strategies   Current medicines are reviewed at length with the patient today .  The patient does not  have concerns regarding medicines although she was requesting medicine for her nerves. I suggested she discuss this with her PCP

## 2016-07-10 NOTE — Patient Instructions (Signed)
Your physician recommends that you continue on your current medications as directed. Please refer to the Current Medication list given to you today. Your physician wants you to follow-up in: 1 year with [name], APP.  You will receive a reminder letter in the mail two months in advance. If you don't receive a letter, please call our office to schedule the follow-up appointment.

## 2016-07-17 LAB — CUP PACEART INCLINIC DEVICE CHECK
Date Time Interrogation Session: 20171030184225
MDC IDC PG IMPLANT DT: 20160921

## 2016-07-19 ENCOUNTER — Other Ambulatory Visit: Payer: Self-pay | Admitting: *Deleted

## 2016-07-19 MED ORDER — ESCITALOPRAM OXALATE 10 MG PO TABS: 10.0000 mg | ORAL_TABLET | Freq: Every day | ORAL | 2 refills | Status: DC

## 2016-07-20 ENCOUNTER — Other Ambulatory Visit: Payer: Self-pay | Admitting: Internal Medicine

## 2016-07-25 ENCOUNTER — Ambulatory Visit: Payer: Commercial Managed Care - HMO | Admitting: Podiatry

## 2016-07-26 ENCOUNTER — Ambulatory Visit (INDEPENDENT_AMBULATORY_CARE_PROVIDER_SITE_OTHER): Payer: Commercial Managed Care - HMO | Admitting: *Deleted

## 2016-07-26 DIAGNOSIS — I639 Cerebral infarction, unspecified: Secondary | ICD-10-CM

## 2016-07-26 NOTE — Progress Notes (Signed)
Carelink Summary Report / Loop Recorder 

## 2016-07-27 LAB — CUP PACEART REMOTE DEVICE CHECK
Implantable Pulse Generator Implant Date: 20160921
MDC IDC SESS DTM: 20171016173924

## 2016-08-01 ENCOUNTER — Ambulatory Visit (INDEPENDENT_AMBULATORY_CARE_PROVIDER_SITE_OTHER): Payer: Commercial Managed Care - HMO | Admitting: Internal Medicine

## 2016-08-01 ENCOUNTER — Encounter: Payer: Self-pay | Admitting: Internal Medicine

## 2016-08-01 DIAGNOSIS — R634 Abnormal weight loss: Secondary | ICD-10-CM | POA: Diagnosis not present

## 2016-08-01 DIAGNOSIS — E1169 Type 2 diabetes mellitus with other specified complication: Secondary | ICD-10-CM

## 2016-08-01 DIAGNOSIS — G301 Alzheimer's disease with late onset: Secondary | ICD-10-CM | POA: Diagnosis not present

## 2016-08-01 DIAGNOSIS — I10 Essential (primary) hypertension: Secondary | ICD-10-CM | POA: Diagnosis not present

## 2016-08-01 DIAGNOSIS — F411 Generalized anxiety disorder: Secondary | ICD-10-CM

## 2016-08-01 DIAGNOSIS — E669 Obesity, unspecified: Secondary | ICD-10-CM

## 2016-08-01 DIAGNOSIS — F0281 Dementia in other diseases classified elsewhere with behavioral disturbance: Secondary | ICD-10-CM

## 2016-08-01 DIAGNOSIS — F02818 Dementia in other diseases classified elsewhere, unspecified severity, with other behavioral disturbance: Secondary | ICD-10-CM

## 2016-08-01 MED ORDER — DONEPEZIL HCL 10 MG PO TABS: 10.0000 mg | ORAL_TABLET | Freq: Every day | ORAL | 3 refills | Status: DC

## 2016-08-01 NOTE — Progress Notes (Signed)
Subjective:  Patient ID: Tracy [name], female    DOB: 05/23/1941  Age: 75 y.o. MRN: [id number]  CC: No chief complaint on file.   HPI Tracy PaganiniYvonne D Bizzarro presents for dementia, B12 def, DM. C/o anxiety, angry at times  Outpatient Medications Prior to Visit  Medication Sig Dispense Refill  . acetaminophen (TYLENOL) 325 MG tablet Take 650 mg by mouth every 6 (six) hours as needed for pain.    Marland Kitchen. aspirin EC 81 MG tablet Take 81 mg by mouth daily.    . cetaphil (CETAPHIL) lotion Apply 1 application topically 2 (two) times daily.    . cholecalciferol (VITAMIN D) 1000 UNITS tablet Take 1,000 Units by mouth daily.    . cyanocobalamin ([employer]S VITAMIN B12) 1000 MCG tablet TAKE 1 TABLET (1,000 MCG TOTAL) BY MOUTH DAILY. 90 tablet 1  . donepezil (ARICEPT) 5 MG tablet Take 1 tablet (5 mg total) by mouth at bedtime. 90 tablet 3  . escitalopram (LEXAPRO) 10 MG tablet Take 1 tablet (10 mg total) by mouth daily. 90 tablet 2  . esomeprazole (NEXIUM) 40 MG capsule Take 1 capsule (40 mg total) by mouth daily. 30 capsule 5  . glucose blood test strip One touch Verio Flex.  Use as instructed to test sugars daily and prn.  E11.59 100 each 12  . Ibuprofen-Famotidine 800-26.6 MG TABS Take 1 tablet by mouth 2 (two) times daily as needed (for pain).    . lubiprostone (AMITIZA) 24 MCG capsule Take 24 mcg by mouth 2 (two) times daily as needed for constipation.    . metFORMIN (GLUCOPHAGE) 500 MG tablet TAKE 1 TABLET (500 MG TOTAL) BY MOUTH DAILY WITH BREAKFAST. 90 tablet 3  . spironolactone (ALDACTONE) 25 MG tablet TAKE 1 TABLET (25 MG TOTAL) BY MOUTH DAILY. 90 tablet 3   No facility-administered medications prior to visit.     ROS Review of Systems  Constitutional: Positive for fatigue. Negative for activity change, appetite change, chills and unexpected weight change.  HENT: Negative for congestion, mouth sores and sinus pressure.   Eyes: Negative for visual disturbance.  Respiratory: Negative for cough and  chest tightness.   Gastrointestinal: Negative for abdominal pain and nausea.  Genitourinary: Negative for difficulty urinating, frequency and vaginal pain.  Musculoskeletal: Negative for back pain and gait problem.  Skin: Negative for pallor and rash.  Neurological: Negative for dizziness, tremors, weakness, numbness and headaches.  Psychiatric/Behavioral: Positive for decreased concentration. Negative for confusion and sleep disturbance. The patient is nervous/anxious.     Objective:  BP 132/72   Pulse 80   Temp 98.1 F (36.7 C) (Oral)   Resp 20   Wt 157 lb 8 oz (71.4 kg)   SpO2 96%   BMI 25.42 kg/m   BP Readings from Last 3 Encounters:  08/01/16 132/72  07/10/16 108/66  05/17/16 130/70    Wt Readings from Last 3 Encounters:  08/01/16 157 lb 8 oz (71.4 kg)  07/10/16 156 lb 9.6 oz (71 kg)  05/17/16 153 lb (69.4 kg)    Physical Exam  Constitutional: She appears well-developed. No distress.  HENT:  Head: Normocephalic.  Right Ear: External ear normal.  Left Ear: External ear normal.  Nose: Nose normal.  Mouth/Throat: Oropharynx is clear and moist.  Eyes: Conjunctivae are normal. Pupils are equal, round, and reactive to light. Right eye exhibits no discharge. Left eye exhibits no discharge.  Neck: Normal range of motion. Neck supple. No JVD present. No tracheal deviation present. No thyromegaly present.  Cardiovascular: Normal rate, regular rhythm and normal heart sounds.   Pulmonary/Chest: No stridor. No respiratory distress. She has no wheezes.  Abdominal: Soft. Bowel sounds are normal. She exhibits no distension and no mass. There is no tenderness. There is no rebound and no guarding.  Musculoskeletal: She exhibits no edema or tenderness.  Lymphadenopathy:    She has no cervical adenopathy.  Neurological: She displays normal reflexes. No cranial nerve deficit. She exhibits normal muscle tone. Coordination normal.  Skin: No rash noted. No erythema.  Psychiatric: She  has a normal mood and affect. Her behavior is normal. Judgment and thought content normal.    Lab Results  Component Value Date   WBC 6.0 05/17/2016   HGB 12.9 05/17/2016   HCT 38.3 05/17/2016   PLT 312.0 05/17/2016   GLUCOSE 88 05/17/2016   CHOL 129 10/27/2014   TRIG 82 10/27/2014   HDL 45 10/27/2014   LDLCALC 68 10/27/2014   ALT 8 05/17/2016   AST 14 05/17/2016   NA 144 05/17/2016   K 4.6 05/17/2016   CL 107 05/17/2016   CREATININE 1.04 05/17/2016   BUN 18 05/17/2016   CO2 30 05/17/2016   TSH 4.305 10/28/2014   INR 1.12 02/05/2015   HGBA1C 5.8 05/17/2016   MICROALBUR <0.7 05/17/2016    No results found.  Assessment & Plan:   There are no diagnoses linked to this encounter. I am having Ms. Skufca maintain her acetaminophen, cholecalciferol, aspirin EC, glucose blood, donepezil, spironolactone, cyanocobalamin, esomeprazole, cetaphil, Ibuprofen-Famotidine, lubiprostone, escitalopram, and metFORMIN.  No orders of the defined types were placed in this encounter.    Follow-up: No Follow-up on file.  Sonda PrimesAlex Helmer Dull, MD

## 2016-08-01 NOTE — Assessment & Plan Note (Signed)
Wt Readings from Last 3 Encounters:  08/01/16 157 lb 8 oz (71.4 kg)  07/10/16 156 lb 9.6 oz (71 kg)  05/17/16 153 lb (69.4 kg)

## 2016-08-01 NOTE — Progress Notes (Signed)
Pre visit review using our clinic review tool, if applicable. No additional management support is needed unless otherwise documented below in the visit note. 

## 2016-08-01 NOTE — Assessment & Plan Note (Signed)
Spironolactone

## 2016-08-01 NOTE — Assessment & Plan Note (Signed)
On Metformin 

## 2016-08-01 NOTE — Assessment & Plan Note (Signed)
Increase Aricept to 10 mg a day Lexapro

## 2016-08-01 NOTE — Assessment & Plan Note (Signed)
Increase Aricept to 10 mg a day

## 2016-08-25 ENCOUNTER — Ambulatory Visit (INDEPENDENT_AMBULATORY_CARE_PROVIDER_SITE_OTHER): Payer: Commercial Managed Care - HMO | Admitting: *Deleted

## 2016-08-25 ENCOUNTER — Other Ambulatory Visit: Payer: Self-pay | Admitting: *Deleted

## 2016-08-25 DIAGNOSIS — I639 Cerebral infarction, unspecified: Secondary | ICD-10-CM

## 2016-08-25 MED ORDER — DONEPEZIL HCL 10 MG PO TABS: 10.0000 mg | ORAL_TABLET | Freq: Every day | ORAL | 3 refills | Status: DC

## 2016-08-25 MED ORDER — SPIRONOLACTONE 25 MG PO TABS: ORAL_TABLET | ORAL | 3 refills | Status: DC

## 2016-08-25 MED ORDER — METFORMIN HCL 500 MG PO TABS: ORAL_TABLET | ORAL | 3 refills | Status: DC

## 2016-08-25 MED ORDER — ESCITALOPRAM OXALATE 10 MG PO TABS: 10.0000 mg | ORAL_TABLET | Freq: Every day | ORAL | 3 refills | Status: DC

## 2016-08-28 NOTE — Progress Notes (Signed)
Carelink Summary Report / Loop Recorder 

## 2016-08-31 ENCOUNTER — Telehealth: Payer: Self-pay | Admitting: Internal Medicine

## 2016-08-31 NOTE — Telephone Encounter (Signed)
PLEASE NOTE: All timestamps contained within this report are represented as [place] Standard Time. CONFIDENTIALTY NOTICE: This fax transmission is intended only for the addressee. It contains information that is legally privileged, confidential or otherwise protected from use or disclosure. If you are not the intended recipient, you are strictly prohibited from reviewing, disclosing, copying using or disseminating any of this information or taking any action in reliance on or regarding this information. If you have received this fax in error, please notify us immediately by telephone so that we can arrange for its return to us. Phone: [phone], Toll-Free: [phone], Fax: [phone] Page: 1 of 1 Call Id: [id number] [facility] - Client TELEPHONE ADVICE RECORD [facility] Patient Name: Tracy [name] DOB: 12/21/1940 Initial Comment Left hand is burning and it looks like it was burned. Nurse Assessment Nurse: Elijah Birkaldwell, RN, Lynda Date/Time (Eastern Time): 08/31/2016 10:00:07 AM Confirm and document reason for call. If symptomatic, describe symptoms. ---Left hand is burning and it looks like it was burned. The hand swells up at times. No injury. Has 8/10 pain, taking Ibuprofen, putting Cortisone cream on it. Symptoms for 2 weeks. Does the patient have any new or worsening symptoms? ---Yes Will a triage be completed? ---Yes Related visit to physician within the last 2 weeks? ---No Does the PT have any chronic conditions? (i.e. diabetes, asthma, etc.) ---Yes List chronic conditions. ---diabetic Is this a behavioral health or substance abuse call? ---No Guidelines Guideline Title Affirmed Question Affirmed Notes Hand and Wrist Pain [1] Localized rash is very painful AND [2] no fever Final Disposition User See Physician within 24 Hours Montrosealdwell, RN, Hilton HotelsLynda Comments Caller scheduled with another provider in office who she has seen before. States  it feels like bees are stinging her fingers. Running hand under cool water helps. Referrals REFERRED TO PCP OFFICE Disagree/Comply: Comply

## 2016-09-01 ENCOUNTER — Encounter: Payer: Self-pay | Admitting: Internal Medicine

## 2016-09-01 ENCOUNTER — Ambulatory Visit (INDEPENDENT_AMBULATORY_CARE_PROVIDER_SITE_OTHER): Payer: Commercial Managed Care - HMO | Admitting: Internal Medicine

## 2016-09-01 VITALS — BP 130/70 | HR 84 | Resp 20 | Wt 161.0 lb

## 2016-09-01 DIAGNOSIS — E1169 Type 2 diabetes mellitus with other specified complication: Secondary | ICD-10-CM | POA: Diagnosis not present

## 2016-09-01 DIAGNOSIS — M25532 Pain in left wrist: Secondary | ICD-10-CM

## 2016-09-01 DIAGNOSIS — I10 Essential (primary) hypertension: Secondary | ICD-10-CM | POA: Diagnosis not present

## 2016-09-01 DIAGNOSIS — M25512 Pain in left shoulder: Secondary | ICD-10-CM | POA: Insufficient documentation

## 2016-09-01 DIAGNOSIS — E669 Obesity, unspecified: Secondary | ICD-10-CM

## 2016-09-01 MED ORDER — PREDNISONE 10 MG PO TABS: ORAL_TABLET | ORAL | 0 refills | Status: DC

## 2016-09-01 MED ORDER — TRAMADOL HCL 50 MG PO TABS: 50.0000 mg | ORAL_TABLET | Freq: Four times a day (QID) | ORAL | 1 refills | Status: DC | PRN

## 2016-09-01 NOTE — Assessment & Plan Note (Addendum)
C/w pseudogout vs gout, for depomedrol IM 80, predpac asd,  Pain control, to f/u any worsening symptoms or concerns, declines film for now

## 2016-09-01 NOTE — Progress Notes (Signed)
Subjective:    Patient ID: Tracy PaganiniYvonne D Kuntzman, female    DOB: 10/19/1940, 75 y.o.   MRN: [id number]  HPI  Herewith son, pt is poor historian due to dementia, but c/o several days left wrist and hand pain and swelling, without fever, trauma or hx of gout.  Pain mod but severe to touch, constant, and seems to radiate towards the elbow but hard to localize and no swelling or redness.  No extremitiy weakness or loss of grip.  Also c/o left shoulder pain, moderate, intermittent, sharp, worse to abduct and forward elevated, worse to touch or lie on left side, better to not do those things Past Medical History:  Diagnosis Date  . Acute cholecystitis 08/10/2013   Lap chole on 08/13/13   . Anxiety   . Depression   . Diabetes mellitus without complication ([name])   . Hypertension   . Restless leg syndrome   . Stroke [facility]([name])    Past Surgical History:  Procedure Laterality Date  . CHOLECYSTECTOMY N/A 08/13/2013   Procedure: LAPAROSCOPIC CHOLECYSTECTOMY ;  Surgeon: Currie Parishristian J Streck, MD;  [facility];  Service: General;  Laterality: N/A;  . EP IMPLANTABLE DEVICE N/A 06/02/2015   Procedure: Loop Recorder Insertion;  Surgeon: Duke SalviaSteven C Klein, MD;  [facility];  Service: Cardiovascular;  Laterality: N/A;  . ERCP N/A 08/12/2013   Procedure: ENDOSCOPIC RETROGRADE CHOLANGIOPANCREATOGRAPHY (ERCP);  Surgeon: Theda BelfastPatrick D Hung, MD;  [facility];  Service: Endoscopy;  Laterality: N/A;  . INCISIONAL HERNIA REPAIR N/A 08/20/2013   Procedure: HERNIA REPAIR INCISIONAL UMBILICAL;  Surgeon: Velora Hecklerodd M Gerkin, MD;  [facility];  Service: General;  Laterality: N/A;  . SPHINCTEROTOMY  08/12/2013   Procedure: Dennison MascotSPHINCTEROTOMY;  Surgeon: Theda BelfastPatrick D Hung, MD;  [facility];  Service: Endoscopy;;    reports that she has never smoked. She has never used smokeless tobacco. She reports that she does not drink alcohol or use drugs. family history includes Stroke in her mother. Allergies  Allergen  Reactions  . Wellbutrin [Bupropion] Other (See Comments)    Wt loss   Current Outpatient Prescriptions on File Prior to Visit  Medication Sig Dispense Refill  . acetaminophen (TYLENOL) 325 MG tablet Take 650 mg by mouth every 6 (six) hours as needed for pain.    Marland Kitchen. aspirin EC 81 MG tablet Take 81 mg by mouth daily.    . cetaphil (CETAPHIL) lotion Apply 1 application topically 2 (two) times daily.    . cholecalciferol (VITAMIN D) 1000 UNITS tablet Take 1,000 Units by mouth daily.    . cyanocobalamin ([employer]S VITAMIN B12) 1000 MCG tablet TAKE 1 TABLET (1,000 MCG TOTAL) BY MOUTH DAILY. 90 tablet 1  . donepezil (ARICEPT) 10 MG tablet Take 1 tablet (10 mg total) by mouth at bedtime. 90 tablet 3  . escitalopram (LEXAPRO) 10 MG tablet Take 1 tablet (10 mg total) by mouth daily. 90 tablet 3  . esomeprazole (NEXIUM) 40 MG capsule Take 1 capsule (40 mg total) by mouth daily. 30 capsule 5  . glucose blood test strip One touch Verio Flex.  Use as instructed to test sugars daily and prn.  E11.59 100 each 12  . Ibuprofen-Famotidine 800-26.6 MG TABS Take 1 tablet by mouth 2 (two) times daily as needed (for pain).    . lubiprostone (AMITIZA) 24 MCG capsule Take 24 mcg by mouth 2 (two) times daily as needed for constipation.    . metFORMIN (GLUCOPHAGE) 500 MG tablet TAKE 1 TABLET (500  MG TOTAL) BY MOUTH DAILY WITH BREAKFAST. 90 tablet 3  . spironolactone (ALDACTONE) 25 MG tablet TAKE 1 TABLET (25 MG TOTAL) BY MOUTH DAILY. 90 tablet 3   No current facility-administered medications on file prior to visit.    Review of Systems  Constitutional: Negative for unusual diaphoresis or night sweats HENT: Negative for ear swelling or discharge Eyes: Negative for worsening visual haziness  Respiratory: Negative for choking and stridor.   Gastrointestinal: Negative for distension or worsening eructation Genitourinary: Negative for retention or change in urine volume.  Musculoskeletal: Negative for other MSK pain or  swelling Skin: Negative for color change and worsening wound Neurological: Negative for tremors and numbness other than noted  Psychiatric/Behavioral: Negative for decreased concentration or agitation other than above   All other system neg per pt, limited by dementia    Objective:   Physical Exam BP 130/70   Pulse 84   Resp 20   Wt 161 lb (73 kg)   SpO2 98%   BMI 25.99 kg/m  VS noted,  Constitutional: Pt appears in no apparent distress HENT: Head: NCAT.  Right Ear: External ear normal.  Left Ear: External ear normal.  Eyes: . Pupils are equal, round, and reactive to light. Conjunctivae and EOM are normal Neck: Normal range of motion. Neck supple.  Cardiovascular: Normal rate and regular rhythm.   Pulmonary/Chest: Effort normal and breath sounds without rales or wheezing.  Left wrist : 1-2+ swelling, tender with reduced ROM, mild warm without erythema, UE's o/w neurovasc intact Left shoulder - diffuse tender, worse to abduct to 90 degrees only Neurological: Pt is alert. Not confused , motor grossly intact Skin: Skin is warm. No rash, no LE edema Psychiatric: Pt behavior is normal. No agitation.   POCT - cbg - 96     Assessment & Plan:

## 2016-09-01 NOTE — Assessment & Plan Note (Signed)
stable overall by history and exam, recent data reviewed with pt, and pt to continue medical treatment as before,  to f/u any worsening symptoms or concerns Lab Results  Component Value Date   HGBA1C 5.8 05/17/2016

## 2016-09-01 NOTE — Progress Notes (Signed)
Pre visit review using our clinic review tool, if applicable. No additional management support is needed unless otherwise documented below in the visit note. 

## 2016-09-01 NOTE — Assessment & Plan Note (Signed)
stable overall by history and exam, recent data reviewed with pt, and pt to continue medical treatment as before,  to f/u any worsening symptoms or concerns BP Readings from Last 3 Encounters:  09/01/16 130/70  08/01/16 132/72  07/10/16 108/66

## 2016-09-01 NOTE — Patient Instructions (Signed)
Your blood sugar was OK today  You had the steroid shot today  Please take all new medication as prescribed - the prednisone, and pain medication  Please continue all other medications as before, and refills have been done if requested.  Please have the pharmacy call with any other refills you may need.  Please continue your efforts at being more active, low cholesterol diet, and weight control.  You will be contacted regarding the referral for: Dr Katrinka BlazingSmith - Sports Medicine (though you can cancel the appointment if you are improved with the medication alone)  Please keep your appointments with your specialists as you may have planned

## 2016-09-01 NOTE — Assessment & Plan Note (Signed)
C/w impingement syndrome, for pain control, and refer sport medicine

## 2016-09-07 LAB — CUP PACEART REMOTE DEVICE CHECK
Date Time Interrogation Session: 20171115183951
MDC IDC PG IMPLANT DT: 20160921

## 2016-09-13 ENCOUNTER — Other Ambulatory Visit: Payer: Self-pay | Admitting: Geriatric Medicine

## 2016-09-13 MED ORDER — SPIRONOLACTONE 25 MG PO TABS: ORAL_TABLET | ORAL | 3 refills | Status: DC

## 2016-09-14 ENCOUNTER — Other Ambulatory Visit: Payer: Self-pay | Admitting: Internal Medicine

## 2016-09-21 NOTE — Progress Notes (Signed)
Tracy [name] D.O. [facility] [address] [place], [place] [address] Phone: [phone] Subjective:    I'm seeing this patient by the request  of:  Sonda PrimesAlex Plotnikov, MD   CC: Arm pain left   HQI:[id number]:Subjective  Tracy PaganiniYvonne D [name] is a 76 y.o. female coming in with complaint of left arm pain. Patient past medical history significant for dementia. Patient states that she has unfortunately had some pain in her left wrist and hand. Seem to be more localized down towards the elbow and shoulder. States that it is more of a moderate pain is intermittent and sharp. Worse with abducting or forward flexing of the arm. States that he can even wake her up at night when she lays on that side.  Patient rates the severity of pain is 8 out of 10. Does not remember any joint injury.  Does not believe the weakness is from her stroke. Patient is accompanied with [name].  Past Medical History:  Diagnosis Date  . Acute cholecystitis 08/10/2013   Lap chole on 08/13/13   . Anxiety   . Depression   . Diabetes mellitus without complication ([name])   . Hypertension   . Restless leg syndrome   . Stroke [facility]([name])    Past Surgical History:  Procedure Laterality Date  . CHOLECYSTECTOMY N/A 08/13/2013   Procedure: LAPAROSCOPIC CHOLECYSTECTOMY ;  Surgeon: Currie Parishristian J Streck, MD;  [facility];  Service: General;  Laterality: N/A;  . EP IMPLANTABLE DEVICE N/A 06/02/2015   Procedure: Loop Recorder Insertion;  Surgeon: Duke SalviaSteven C Klein, MD;  [facility];  Service: Cardiovascular;  Laterality: N/A;  . ERCP N/A 08/12/2013   Procedure: ENDOSCOPIC RETROGRADE CHOLANGIOPANCREATOGRAPHY (ERCP);  Surgeon: Theda BelfastPatrick D Hung, MD;  [facility];  Service: Endoscopy;  Laterality: N/A;  . INCISIONAL HERNIA REPAIR N/A 08/20/2013   Procedure: HERNIA REPAIR INCISIONAL UMBILICAL;  Surgeon: Velora Hecklerodd M Gerkin, MD;  [facility];  Service: General;  Laterality: N/A;  . SPHINCTEROTOMY  08/12/2013   Procedure:  Dennison MascotSPHINCTEROTOMY;  Surgeon: Theda BelfastPatrick D Hung, MD;  [facility];  Service: Endoscopy;;   Social History   Social History  . Marital status: Married    Spouse name: N/A  . Number of children: 7  . Years of education: 10   Occupational History  . retired     Designer, fashion/clothingtextiles   Social History Main Topics  . Smoking status: Never Smoker  . Smokeless tobacco: Never Used  . Alcohol use No  . Drug use: No  . Sexual activity: Not Asked   Other Topics Concern  . None   Social History Narrative   Married, 7 children, lives with [name]   Retired from [employer] work   right handed   Caffeine use -  1 soda daily, tea twice weekly   Allergies  Allergen Reactions  . Wellbutrin [Bupropion] Other (See Comments)    Wt loss   Family History  Problem Relation Age of Onset  . Stroke Mother     Past medical history, social, surgical and family history all reviewed in electronic medical record.  No pertanent information unless stated regarding to the chief complaint.   Review of Systems: No headache, visual changes, nausea, vomiting, diarrhea, constipation, dizziness, abdominal pain, skin rash, fevers, chills, night sweats, weight loss, swollen lymph nodes,  chest pain, shortness of breath, mood changes.    Objective  Blood pressure 126/70, pulse 85, height 5\' 6"  (1.676 m), weight 156 lb (70.8 kg), SpO2 98 %.  Systems examined below as of 09/22/16   General: No apparent distress alert and oriented x3 mood and affect normal, dressed appropriately.  HEENT: Pupils equal, extraocular movements intact  Respiratory: Patient's speak in full sentences and does not appear short of breath  Cardiovascular: No lower extremity edema, non tender, no erythema  Skin: Warm dry intact with no signs of infection or rash on extremities or on axial skeleton.  Abdomen: Soft nontender  Neuro: Cranial nerves II through XII are intact, neurovascularly intact in all extremities with 2+ DTRs and 2+ pulses.  Lymph: No  lymphadenopathy of posterior or anterior cervical chain or axillae bilaterally.  Gait normal with good balance and coordination.  MSK:  Non tender with full range of motion and good stability and symmetric strength and tone of  elbows, wrist, hip, knee and ankles bilaterally. Arthritic changes of multiple joints Shoulder: left Inspection reveals no abnormalities, atrophy or asymmetry. Diffusely tender. Does have full active range of motion with mild crepitus Rotator cuff strength normal throughout. signs of impingement with positive Neer and Hawkin's tests, but negative empty can sign. Speeds and Yergason's tests normal. Positive O'Brien's Normal scapular function observed. No painful arc and no drop arm sign. Contralateral shoulder unremarkable  Neck: Inspection unremarkable. No palpable stepoffs. Positive Spurling's maneuver. Lacks last 10 of extension. Minimal sidebending bilaterally. Crepitus noted. Grip strength and sensation normal in bilateral hands Strength good C4 to T1 distribution No sensory change to C4 to T1 Negative Hoffman sign bilaterally Reflexes normal   MSK US performed of: left This study was ordered, performed, and interpreted by Terrilee Files D.O.  Shoulder:   Supraspinatus:  Arthritic changes and degenerative tearing noted.  Subscapularis: intact mild arthritis.  Teres Minor:  Appears normal on long and transverse views. AC joint:  Arthritic changes Glenohumeral Joint:  Arthritic changes Glenoid Labrum:  Intact without visualized tears. Biceps Tendon:  Appears normal on long and transverse views, no fraying of tendon, tendon located in intertubercular groove, no subluxation with shoulder internal or external rotation.  Impression: Mild rotator cuff arthropathy  Procedure note 97110; 15 minutes spent for Therapeutic exercises as stated in above notes.  This included exercises focusing on stretching, strengthening, with significant focus on eccentric  aspects. Shoulder Exercises that included:  Basic scapular stabilization to include adduction and depression of scapula Scaption, focusing on proper movement and good control Internal and External rotation utilizing a theraband, with elbow tucked at side entire time Rows with theraband    Proper technique shown and discussed handout in great detail with ATC.  All questions were discussed and answered.      Impression and Recommendations:     This case required medical decision making of moderate complexity.      Note: This dictation was prepared with Dragon dictation along with smaller phrase technology. Any transcriptional errors that result from this process are unintentional.

## 2016-09-22 ENCOUNTER — Ambulatory Visit: Payer: Self-pay

## 2016-09-22 ENCOUNTER — Encounter: Payer: Self-pay | Admitting: Family Medicine

## 2016-09-22 ENCOUNTER — Ambulatory Visit (INDEPENDENT_AMBULATORY_CARE_PROVIDER_SITE_OTHER): Payer: Medicare HMO | Admitting: Family Medicine

## 2016-09-22 VITALS — BP 126/70 | HR 85 | Ht 66.0 in | Wt 156.0 lb

## 2016-09-22 DIAGNOSIS — M79602 Pain in left arm: Secondary | ICD-10-CM | POA: Diagnosis not present

## 2016-09-22 DIAGNOSIS — M12812 Other specific arthropathies, not elsewhere classified, left shoulder: Secondary | ICD-10-CM

## 2016-09-22 DIAGNOSIS — M5010 Cervical disc disorder with radiculopathy, unspecified cervical region: Secondary | ICD-10-CM

## 2016-09-22 DIAGNOSIS — M75102 Unspecified rotator cuff tear or rupture of left shoulder, not specified as traumatic: Secondary | ICD-10-CM

## 2016-09-22 MED ORDER — GABAPENTIN 100 MG PO CAPS: 200.0000 mg | ORAL_CAPSULE | Freq: Every day | ORAL | 3 refills | Status: DC

## 2016-09-22 NOTE — Assessment & Plan Note (Signed)
Patient does have more of a cervical radiculopathy. We will try conservative therapy. Work with Event organiserathletic trainer for home exercises. Patient will be given gabapentin to help with the radicular symptoms. We discussed icing regimen. Discussed avoiding certain activities. Patient will come back and see me again in 4 weeks. Worsening symptoms we'll consider x-rays and potentially home physical therapy.

## 2016-09-22 NOTE — Assessment & Plan Note (Signed)
Noted degenerative changes. We discussed icing regimen and home exercises. Gabapentin given for radicular symptoms. Patient work with Event organiserathletic trainer for range of motion exercises. Worsening symptoms at follow-up we'll consider injection for diagnostic and hopefully therapeutic purposes.

## 2016-09-22 NOTE — Patient Instructions (Signed)
Good to see you  Ice 20 minutes 2 times daily. Usually after activity and before bed. pennsaid pinkie amount topically 2 times daily as needed.  Tylenol 500mg  3 times a day can help with pain  Tart cherry extract any dose at night Gabapentin 200mg  at night Exercises 3 times a week.  See me again in 4 weeks to make sure you are doing better and so I can see your smile.

## 2016-09-25 ENCOUNTER — Ambulatory Visit (INDEPENDENT_AMBULATORY_CARE_PROVIDER_SITE_OTHER): Payer: Medicare HMO | Admitting: *Deleted

## 2016-09-25 DIAGNOSIS — I639 Cerebral infarction, unspecified: Secondary | ICD-10-CM | POA: Diagnosis not present

## 2016-09-25 NOTE — Progress Notes (Signed)
Carelink Summary Report / Loop Recorder 

## 2016-10-15 LAB — CUP PACEART REMOTE DEVICE CHECK
Date Time Interrogation Session: 20171215184102
Implantable Pulse Generator Implant Date: 20160921

## 2016-10-15 NOTE — Progress Notes (Signed)
Carelink summary report received. Battery status OK. Normal device function. No new symptom episodes, tachy episodes, brady, or pause episodes. No new AF episodes. Monthly summary reports and ROV/PRN 

## 2016-10-18 ENCOUNTER — Other Ambulatory Visit: Payer: Self-pay | Admitting: Internal Medicine

## 2016-10-20 ENCOUNTER — Ambulatory Visit: Payer: Medicare HMO | Admitting: Family Medicine

## 2016-10-24 ENCOUNTER — Ambulatory Visit (INDEPENDENT_AMBULATORY_CARE_PROVIDER_SITE_OTHER): Payer: Medicare HMO | Admitting: *Deleted

## 2016-10-24 DIAGNOSIS — I639 Cerebral infarction, unspecified: Secondary | ICD-10-CM | POA: Diagnosis not present

## 2016-10-25 NOTE — Progress Notes (Signed)
Carelink Summary Report / Loop Recorder 

## 2016-11-03 LAB — CUP PACEART REMOTE DEVICE CHECK
Implantable Pulse Generator Implant Date: 20160921
MDC IDC SESS DTM: 20180114190942

## 2016-11-06 ENCOUNTER — Telehealth: Payer: Self-pay | Admitting: Internal Medicine

## 2016-11-06 NOTE — Telephone Encounter (Signed)
PLEASE NOTE: All timestamps contained within this report are represented as [place] Standard Time. CONFIDENTIALTY NOTICE: This fax transmission is intended only for the addressee. It contains information that is legally privileged, confidential or otherwise protected from use or disclosure. If you are not the intended recipient, you are strictly prohibited from reviewing, disclosing, copying using or disseminating any of this information or taking any action in reliance on or regarding this information. If you have received this fax in error, please notify us immediately by telephone so that we can arrange for its return to us. Phone: [phone], Toll-Free: [phone], Fax: [phone] Page: 1 of 1 Call Id: [id number] [facility] - Client TELEPHONE ADVICE RECORD [facility] Patient Name: Tracy [name] DOB: 08/28/1941 Initial Comment Caller has ringing in left ear. Nurse Assessment Nurse: Elijah Birkaldwell, RN, Lynda Date/Time (Eastern Time): 11/06/2016 9:17:25 AM Confirm and document reason for call. If symptomatic, describe symptoms. ---Caller has ringing in left ear, like a honey bee. This has been going on for 3-4 years. No pain in ear. Does the patient have any new or worsening symptoms? ---Yes Will a triage be completed? ---Yes Related visit to physician within the last 2 weeks? ---No Does the PT have any chronic conditions? (i.e. diabetes, asthma, etc.) ---Yes List chronic conditions. ---heart trouble, diabetic Is this a behavioral health or substance abuse call? ---No Guidelines Guideline Title Affirmed Question Affirmed Notes Tinnitus MODERATE-SEVERE tinnitus (i.e., interferes with work, school, or sleep) Final Disposition User See PCP When Office is Open (within 3 days) Elijah Birkaldwell, Charity fundraiserN, [employer]ysRusLynda Comments Appt. not scheduled as caller is not sure when her son can bring her in.She thinks the ringing in her ear happens when she is worried  or nervous. Referrals REFERRED TO PCP OFFICE Disagree/Comply: Comply

## 2016-11-09 ENCOUNTER — Other Ambulatory Visit: Payer: Self-pay | Admitting: Internal Medicine

## 2016-11-14 ENCOUNTER — Ambulatory Visit: Payer: Commercial Managed Care - HMO | Admitting: Internal Medicine

## 2016-11-20 LAB — CUP PACEART REMOTE DEVICE CHECK
Implantable Pulse Generator Implant Date: 20160921
MDC IDC SESS DTM: 20180213193921

## 2016-11-21 ENCOUNTER — Other Ambulatory Visit: Payer: Self-pay | Admitting: Family Medicine

## 2016-11-23 ENCOUNTER — Ambulatory Visit (INDEPENDENT_AMBULATORY_CARE_PROVIDER_SITE_OTHER): Payer: Medicare HMO | Admitting: *Deleted

## 2016-11-23 DIAGNOSIS — I639 Cerebral infarction, unspecified: Secondary | ICD-10-CM

## 2016-11-24 ENCOUNTER — Ambulatory Visit (INDEPENDENT_AMBULATORY_CARE_PROVIDER_SITE_OTHER)
Admission: RE | Admit: 2016-11-24 | Discharge: 2016-11-24 | Disposition: A | Discharge status: Home / Self Care | Payer: Medicare HMO | Source: Ambulatory Visit | Attending: Nurse Practitioner | Admitting: Nurse Practitioner

## 2016-11-24 ENCOUNTER — Ambulatory Visit (INDEPENDENT_AMBULATORY_CARE_PROVIDER_SITE_OTHER): Payer: Medicare HMO | Admitting: Nurse Practitioner

## 2016-11-24 ENCOUNTER — Encounter: Payer: Self-pay | Admitting: Nurse Practitioner

## 2016-11-24 VITALS — BP 128/88 | HR 77 | Temp 98.0°F | Ht 66.0 in | Wt 169.0 lb

## 2016-11-24 DIAGNOSIS — M5010 Cervical disc disorder with radiculopathy, unspecified cervical region: Secondary | ICD-10-CM

## 2016-11-24 DIAGNOSIS — M25532 Pain in left wrist: Secondary | ICD-10-CM | POA: Diagnosis not present

## 2016-11-24 DIAGNOSIS — M542 Cervicalgia: Secondary | ICD-10-CM | POA: Diagnosis not present

## 2016-11-24 DIAGNOSIS — M25512 Pain in left shoulder: Secondary | ICD-10-CM

## 2016-11-24 NOTE — Progress Notes (Signed)
Carelink Summary Report / Loop Recorder 

## 2016-11-24 NOTE — Progress Notes (Signed)
Subjective:  Patient ID: Tracy [name], female    DOB: 1941/01/13  Age: 76 y.o. MRN: [id number]  CC: Hand Pain (left hand pain--on and off. Dr. Jonny Ruiz gave her med but it came back. )   HPI  Chronic intermittent left arm and hand pain. describes as burning and hot. Did not take gabapentin as prescribed by dr. Katrinka Blazing. Denies any new symptoms  Outpatient Medications Prior to Visit  Medication Sig Dispense Refill  . acetaminophen (TYLENOL) 325 MG tablet Take 650 mg by mouth every 6 (six) hours as needed for pain.    Marland Kitchen aspirin EC 81 MG tablet Take 81 mg by mouth daily.    . cholecalciferol (VITAMIN D) 1000 UNITS tablet Take 1,000 Units by mouth daily.    . CVS VITAMIN B12 1000 MCG tablet TAKE 1 TABLET EVERY DAY 90 tablet 1  . donepezil (ARICEPT) 10 MG tablet Take 1 tablet (10 mg total) by mouth at bedtime. 90 tablet 3  . escitalopram (LEXAPRO) 10 MG tablet Take 1 tablet (10 mg total) by mouth daily. 90 tablet 3  . esomeprazole (NEXIUM) 40 MG capsule TAKE ONE CAPSULE EVERY DAY 30 capsule 5  . gabapentin (NEURONTIN) 100 MG capsule TAKE 2 CAPSULES AT BEDTIME 60 capsule 1  . glucose blood test strip One touch Verio Flex.  Use as instructed to test sugars daily and prn.  E11.59 100 each 12  . Ibuprofen-Famotidine 800-26.6 MG TABS Take 1 tablet by mouth 2 (two) times daily as needed (for pain).    . lubiprostone (AMITIZA) 24 MCG capsule Take 24 mcg by mouth 2 (two) times daily as needed for constipation.    . metFORMIN (GLUCOPHAGE) 500 MG tablet TAKE 1 TABLET (500 MG TOTAL) BY MOUTH DAILY WITH BREAKFAST. 90 tablet 3  . spironolactone (ALDACTONE) 25 MG tablet TAKE 1 TABLET (25 MG TOTAL) BY MOUTH DAILY. 90 tablet 3  . cetaphil (CETAPHIL) lotion Apply 1 application topically 2 (two) times daily.    . traMADol (ULTRAM) 50 MG tablet Take 1 tablet (50 mg total) by mouth every 6 (six) hours as needed. (Patient not taking: Reported on 11/24/2016) 60 tablet 1   No facility-administered medications prior  to visit.     ROS See HPI  Objective:  BP 128/88   Pulse 77   Temp 98 F (36.7 C)   Ht 5\' 6"  (1.676 m)   Wt 169 lb (76.7 kg)   SpO2 97%   BMI 27.28 kg/m   BP Readings from Last 3 Encounters:  11/24/16 128/88  09/22/16 126/70  09/01/16 130/70    Wt Readings from Last 3 Encounters:  11/24/16 169 lb (76.7 kg)  09/22/16 156 lb (70.8 kg)  09/01/16 161 lb (73 kg)    Physical Exam  Constitutional: She is oriented to person, place, and time.  Neck: Normal range of motion. Neck supple.  Cardiovascular: Normal rate.   Musculoskeletal: Normal range of motion. She exhibits no edema or tenderness.       Left shoulder: Normal.       Left elbow: Normal.       Left wrist: Normal.       Left hand: Normal.  Neurological: She is alert and oriented to person, place, and time.  Skin: Skin is warm and dry. No rash noted. No erythema.  Vitals reviewed.   Lab Results  Component Value Date   WBC 6.0 05/17/2016   HGB 12.9 05/17/2016   HCT 38.3 05/17/2016   PLT 312.0 05/17/2016  GLUCOSE 88 05/17/2016   CHOL 129 10/27/2014   TRIG 82 10/27/2014   HDL 45 10/27/2014   LDLCALC 68 10/27/2014   ALT 8 05/17/2016   AST 14 05/17/2016   NA 144 05/17/2016   K 4.6 05/17/2016   CL 107 05/17/2016   CREATININE 1.04 05/17/2016   BUN 18 05/17/2016   CO2 30 05/17/2016   TSH 4.305 10/28/2014   INR 1.12 02/05/2015   HGBA1C 5.8 05/17/2016   MICROALBUR <0.7 05/17/2016    No results found.  Assessment & Plan:   Tracy [name] was seen today for hand pain.  Diagnoses and all orders for this visit:  Cervical disc disorder with radiculopathy of cervical region -     DG Cervical Spine Complete; Future  Left shoulder pain, unspecified chronicity -     DG Cervical Spine Complete; Future  Left wrist pain -     DG Cervical Spine Complete; Future   I have discontinued [name]'s traMADol. I am also having her maintain her acetaminophen, cholecalciferol, aspirin EC, glucose blood, cetaphil,  Ibuprofen-Famotidine, lubiprostone, donepezil, escitalopram, metFORMIN, spironolactone, CVS VITAMIN B12, esomeprazole, and gabapentin.  No orders of the defined types were placed in this encounter.   Follow-up: Return if symptoms worsen or fail to improve.  Alysia Pennaharlotte Sway Guttierrez, NP

## 2016-11-24 NOTE — Patient Instructions (Signed)
Start gabapentin as prescribed.  Go to basement for for x-ray.  Use cold compress 2-3times a day as needed

## 2016-11-24 NOTE — Progress Notes (Signed)
Pre visit review using our clinic review tool, if applicable. No additional management support is needed unless otherwise documented below in the visit note. 

## 2016-12-02 LAB — CUP PACEART REMOTE DEVICE CHECK
Date Time Interrogation Session: 20180315194146
Implantable Pulse Generator Implant Date: 20160921

## 2016-12-02 NOTE — Progress Notes (Signed)
Carelink summary report received. Battery status OK. Normal device function. No new symptom episodes, tachy episodes, brady, or pause episodes. No new AF episodes. Monthly summary reports and ROV/PRN 

## 2016-12-04 ENCOUNTER — Encounter: Payer: Self-pay | Admitting: Internal Medicine

## 2016-12-04 ENCOUNTER — Ambulatory Visit (INDEPENDENT_AMBULATORY_CARE_PROVIDER_SITE_OTHER): Payer: Medicare HMO | Admitting: Internal Medicine

## 2016-12-04 ENCOUNTER — Other Ambulatory Visit (INDEPENDENT_AMBULATORY_CARE_PROVIDER_SITE_OTHER): Payer: Medicare HMO

## 2016-12-04 DIAGNOSIS — G301 Alzheimer's disease with late onset: Secondary | ICD-10-CM

## 2016-12-04 DIAGNOSIS — H6123 Impacted cerumen, bilateral: Secondary | ICD-10-CM

## 2016-12-04 DIAGNOSIS — H6120 Impacted cerumen, unspecified ear: Secondary | ICD-10-CM | POA: Insufficient documentation

## 2016-12-04 DIAGNOSIS — E119 Type 2 diabetes mellitus without complications: Secondary | ICD-10-CM

## 2016-12-04 DIAGNOSIS — F0281 Dementia in other diseases classified elsewhere with behavioral disturbance: Secondary | ICD-10-CM | POA: Diagnosis not present

## 2016-12-04 DIAGNOSIS — I10 Essential (primary) hypertension: Secondary | ICD-10-CM | POA: Diagnosis not present

## 2016-12-04 DIAGNOSIS — Z23 Encounter for immunization: Secondary | ICD-10-CM

## 2016-12-04 DIAGNOSIS — F02818 Dementia in other diseases classified elsewhere, unspecified severity, with other behavioral disturbance: Secondary | ICD-10-CM

## 2016-12-04 LAB — BASIC METABOLIC PANEL
BUN: 26 mg/dL — AB (ref 6–23)
CALCIUM: 9.9 mg/dL (ref 8.4–10.5)
CO2: 30 meq/L (ref 19–32)
Chloride: 105 mEq/L (ref 96–112)
Creatinine, Ser: 1.06 mg/dL (ref 0.40–1.20)
GFR: 64.92 mL/min (ref >60.00)
GLUCOSE: 90 mg/dL (ref 70–99)
Potassium: 4.3 mEq/L (ref 3.5–5.1)
SODIUM: 143 meq/L (ref 135–145)

## 2016-12-04 LAB — HEMOGLOBIN A1C: Hgb A1c MFr Bld: 5.9 % (ref 4.6–6.5)

## 2016-12-04 MED ORDER — PNEUMOCOCCAL 13-VAL CONJ VACC IM SUSP: 0.5000 mL | INTRAMUSCULAR | Status: DC

## 2016-12-04 NOTE — Progress Notes (Signed)
Pre-visit discussion using our clinic review tool. No additional management support is needed unless otherwise documented below in the visit note.  

## 2016-12-04 NOTE — Assessment & Plan Note (Signed)
See procedure 

## 2016-12-04 NOTE — Assessment & Plan Note (Signed)
On Aricept 

## 2016-12-04 NOTE — Progress Notes (Signed)
Subjective:  Patient ID: Tracy [name], female    DOB: 09/15/1940  Age: 76 y.o. MRN: [id number]  CC: Diabetes; Hypertension; Gastroesophageal Reflux; Ear Problem; and Headache (2 weeks off and on, possible stree )   HPI Tracy PaganiniYvonne D [name] presents for anxiety, DM, HTN, dementia f/u  Outpatient Medications Prior to Visit  Medication Sig Dispense Refill  . acetaminophen (TYLENOL) 325 MG tablet Take 650 mg by mouth every 6 (six) hours as needed for pain.    Marland Kitchen. aspirin EC 81 MG tablet Take 81 mg by mouth daily.    . cetaphil (CETAPHIL) lotion Apply 1 application topically 2 (two) times daily.    . cholecalciferol (VITAMIN D) 1000 UNITS tablet Take 1,000 Units by mouth daily.    . CVS VITAMIN B12 1000 MCG tablet TAKE 1 TABLET EVERY DAY 90 tablet 1  . donepezil (ARICEPT) 10 MG tablet Take 1 tablet (10 mg total) by mouth at bedtime. 90 tablet 3  . escitalopram (LEXAPRO) 10 MG tablet Take 1 tablet (10 mg total) by mouth daily. 90 tablet 3  . esomeprazole (NEXIUM) 40 MG capsule TAKE ONE CAPSULE EVERY DAY 30 capsule 5  . gabapentin (NEURONTIN) 100 MG capsule TAKE 2 CAPSULES AT BEDTIME 60 capsule 1  . glucose blood test strip One touch Verio Flex.  Use as instructed to test sugars daily and prn.  E11.59 100 each 12  . Ibuprofen-Famotidine 800-26.6 MG TABS Take 1 tablet by mouth 2 (two) times daily as needed (for pain).    . lubiprostone (AMITIZA) 24 MCG capsule Take 24 mcg by mouth 2 (two) times daily as needed for constipation.    . metFORMIN (GLUCOPHAGE) 500 MG tablet TAKE 1 TABLET (500 MG TOTAL) BY MOUTH DAILY WITH BREAKFAST. 90 tablet 3  . spironolactone (ALDACTONE) 25 MG tablet TAKE 1 TABLET (25 MG TOTAL) BY MOUTH DAILY. 90 tablet 3   No facility-administered medications prior to visit.     ROS Review of Systems  Constitutional: Negative for activity change, appetite change, chills, fatigue and unexpected weight change.  HENT: Negative for congestion, mouth sores and sinus pressure.     Eyes: Negative for visual disturbance.  Respiratory: Negative for cough and chest tightness.   Gastrointestinal: Negative for abdominal pain and nausea.  Genitourinary: Negative for difficulty urinating, frequency and vaginal pain.  Musculoskeletal: Negative for back pain and gait problem.  Skin: Negative for pallor and rash.  Neurological: Negative for dizziness, tremors, weakness, numbness and headaches.  Psychiatric/Behavioral: Positive for decreased concentration. Negative for confusion and sleep disturbance. The patient is nervous/anxious.     Objective:  BP 130/88   Pulse 76   Temp 98.1 F (36.7 C) (Oral)   Resp 16   Ht 5\' 6"  (1.676 m)   Wt 170 lb 4 oz (77.2 kg)   SpO2 95%   BMI 27.48 kg/m   BP Readings from Last 3 Encounters:  12/04/16 130/88  11/24/16 128/88  09/22/16 126/70    Wt Readings from Last 3 Encounters:  12/04/16 170 lb 4 oz (77.2 kg)  11/24/16 169 lb (76.7 kg)  09/22/16 156 lb (70.8 kg)    Physical Exam  Constitutional: She appears well-developed. No distress.  HENT:  Head: Normocephalic.  Right Ear: External ear normal.  Left Ear: External ear normal.  Nose: Nose normal.  Mouth/Throat: Oropharynx is clear and moist.  Eyes: Conjunctivae are normal. Pupils are equal, round, and reactive to light. Right eye exhibits no discharge. Left eye exhibits no discharge.  Neck: Normal range of motion. Neck supple. No JVD present. No tracheal deviation present. No thyromegaly present.  Cardiovascular: Normal rate, regular rhythm and normal heart sounds.   Pulmonary/Chest: No stridor. No respiratory distress. She has no wheezes.  Abdominal: Soft. Bowel sounds are normal. She exhibits no distension and no mass. There is no tenderness. There is no rebound and no guarding.  Musculoskeletal: She exhibits no edema or tenderness.  Lymphadenopathy:    She has no cervical adenopathy.  Neurological: She displays normal reflexes. No cranial nerve deficit. She exhibits  normal muscle tone. Coordination normal.  Skin: No rash noted. No erythema.  Psychiatric: She has a normal mood and affect. Her behavior is normal. Thought content normal.  B wax    Procedure Note :     Procedure :  Ear irrigation B   Indication:  Cerumen impaction   Risks, including pain, dizziness, eardrum perforation, bleeding, infection and others as well as benefits were explained to the patient in detail. Verbal consent was obtained and the patient agreed to proceed.    We used "The Elephant Ear Irrigation Device" filled with lukewarm water for irrigation. A large amount wax was recovered B. Procedure has also required manual wax removal with an ear loop B.   Tolerated well. Complications: None.   Postprocedure instructions :  Call if problems.    Lab Results  Component Value Date   WBC 6.0 05/17/2016   HGB 12.9 05/17/2016   HCT 38.3 05/17/2016   PLT 312.0 05/17/2016   GLUCOSE 88 05/17/2016   CHOL 129 10/27/2014   TRIG 82 10/27/2014   HDL 45 10/27/2014   LDLCALC 68 10/27/2014   ALT 8 05/17/2016   AST 14 05/17/2016   NA 144 05/17/2016   K 4.6 05/17/2016   CL 107 05/17/2016   CREATININE 1.04 05/17/2016   BUN 18 05/17/2016   CO2 30 05/17/2016   TSH 4.305 10/28/2014   INR 1.12 02/05/2015   HGBA1C 5.8 05/17/2016   MICROALBUR <0.7 05/17/2016    Dg Cervical Spine Complete  Result Date: 11/24/2016 CLINICAL DATA:  Left-sided neck pain for 2 months, no known injury, initial encounter EXAM: CERVICAL SPINE - COMPLETE 4+ VIEW COMPARISON:  None. FINDINGS: Seven cervical segments are well visualized. Vertebral body height is well maintained. Disc space narrowing is noted at C4-5 and C5-6 with associated osteophytic changes. Bilateral neural foraminal narrowing is noted at these levels as well. No acute fracture or acute facet abnormality is noted. The odontoid is within normal limits. No soft tissue abnormality is seen. IMPRESSION: Degenerative change without acute  abnormality. Electronically Signed   By: Alcide Clever M.D.   On: 11/24/2016 15:42    Assessment & Plan:   There are no diagnoses linked to this encounter. I am having Ms. Sheu maintain her acetaminophen, cholecalciferol, aspirin EC, glucose blood, cetaphil, Ibuprofen-Famotidine, lubiprostone, donepezil, escitalopram, metFORMIN, spironolactone, CVS VITAMIN B12, esomeprazole, and gabapentin.  No orders of the defined types were placed in this encounter.    Follow-up: No Follow-up on file.  Sonda Primes, MD

## 2016-12-07 ENCOUNTER — Telehealth: Payer: Self-pay | Admitting: Internal Medicine

## 2016-12-11 ENCOUNTER — Ambulatory Visit: Payer: Medicare HMO | Admitting: Family Medicine

## 2016-12-20 ENCOUNTER — Other Ambulatory Visit: Payer: Self-pay

## 2016-12-20 MED ORDER — GABAPENTIN 100 MG PO CAPS: 200.0000 mg | ORAL_CAPSULE | Freq: Every day | ORAL | 0 refills | Status: DC

## 2016-12-25 ENCOUNTER — Ambulatory Visit (INDEPENDENT_AMBULATORY_CARE_PROVIDER_SITE_OTHER): Payer: Medicare HMO | Admitting: *Deleted

## 2016-12-25 DIAGNOSIS — I639 Cerebral infarction, unspecified: Secondary | ICD-10-CM | POA: Diagnosis not present

## 2016-12-25 NOTE — Progress Notes (Signed)
Carelink Summary Report / Loop Recorder 

## 2017-01-01 ENCOUNTER — Ambulatory Visit: Payer: Medicare HMO | Admitting: Internal Medicine

## 2017-01-06 LAB — CUP PACEART REMOTE DEVICE CHECK
Implantable Pulse Generator Implant Date: 20160921
MDC IDC SESS DTM: 20180414194149

## 2017-01-06 NOTE — Progress Notes (Signed)
Carelink summary report received. Battery status OK. Normal device function. No new symptom episodes, tachy episodes, brady, or pause episodes. No new AF episodes. Monthly summary reports and ROV/PRN 

## 2017-01-19 ENCOUNTER — Telehealth: Payer: Self-pay | Admitting: Internal Medicine

## 2017-01-19 NOTE — Telephone Encounter (Signed)
Attempted to call patient to schedule awv. Patient did not answer will try to call patient at a later time.

## 2017-01-22 ENCOUNTER — Ambulatory Visit: Payer: Commercial Managed Care - HMO | Admitting: Podiatry

## 2017-01-22 ENCOUNTER — Ambulatory Visit (INDEPENDENT_AMBULATORY_CARE_PROVIDER_SITE_OTHER): Payer: Medicare HMO | Admitting: *Deleted

## 2017-01-22 DIAGNOSIS — I639 Cerebral infarction, unspecified: Secondary | ICD-10-CM | POA: Diagnosis not present

## 2017-01-23 NOTE — Progress Notes (Signed)
Carelink Summary Report / Loop Recorder 

## 2017-01-26 LAB — CUP PACEART REMOTE DEVICE CHECK
Implantable Pulse Generator Implant Date: 20160921
MDC IDC SESS DTM: 20180514200650

## 2017-02-21 ENCOUNTER — Ambulatory Visit (INDEPENDENT_AMBULATORY_CARE_PROVIDER_SITE_OTHER): Payer: Medicare HMO | Admitting: *Deleted

## 2017-02-21 DIAGNOSIS — I639 Cerebral infarction, unspecified: Secondary | ICD-10-CM

## 2017-02-22 ENCOUNTER — Telehealth: Payer: Self-pay | Admitting: Internal Medicine

## 2017-02-22 NOTE — Telephone Encounter (Signed)
Unable to reach pt, other number was daughter who was not on hippa, informed her we need patient to call back and schedule appt.

## 2017-02-22 NOTE — Telephone Encounter (Signed)
Pt is having burning in her breast and in her hands and said that it could be the meds that she is taking for her sugar.  She would like someone to call her Best number [phone]

## 2017-02-22 NOTE — Progress Notes (Signed)
Carelink Summary Report / Loop Recorder 

## 2017-02-26 ENCOUNTER — Encounter: Payer: Self-pay | Admitting: Podiatry

## 2017-02-26 ENCOUNTER — Ambulatory Visit (INDEPENDENT_AMBULATORY_CARE_PROVIDER_SITE_OTHER): Payer: Medicare HMO | Admitting: Podiatry

## 2017-02-26 DIAGNOSIS — B351 Tinea unguium: Secondary | ICD-10-CM

## 2017-02-26 DIAGNOSIS — M79676 Pain in unspecified toe(s): Secondary | ICD-10-CM | POA: Diagnosis not present

## 2017-02-26 DIAGNOSIS — R109 Unspecified abdominal pain: Secondary | ICD-10-CM | POA: Insufficient documentation

## 2017-02-26 DIAGNOSIS — M79604 Pain in right leg: Secondary | ICD-10-CM

## 2017-02-26 DIAGNOSIS — Q828 Other specified congenital malformations of skin: Secondary | ICD-10-CM | POA: Diagnosis not present

## 2017-02-26 DIAGNOSIS — E1149 Type 2 diabetes mellitus with other diabetic neurological complication: Secondary | ICD-10-CM

## 2017-02-26 DIAGNOSIS — M79605 Pain in left leg: Secondary | ICD-10-CM

## 2017-02-26 DIAGNOSIS — G4700 Insomnia, unspecified: Secondary | ICD-10-CM | POA: Insufficient documentation

## 2017-02-26 DIAGNOSIS — E1140 Type 2 diabetes mellitus with diabetic neuropathy, unspecified: Secondary | ICD-10-CM

## 2017-02-28 NOTE — Progress Notes (Signed)
Subjective: 76 y.o. returns the office today for painful, elongated, thickened toenails which she cannot trim herself. Denies any redness or drainage around the nails. She also has a callus that needs to be trimmed. No swelling, redness, drainage from this area. Denies any acute changes since last appointment and no new complaints today. Denies any systemic complaints such as fevers, chills, nausea, vomiting.   Objective: AAO 3, NAD DP/PT pulses 1/4 Protective sensation decreased with Simms Weinstein monofilament Nails hypertrophic, dystrophic, elongated, brittle, discolored 10. There is tenderness overlying the nails 1-5 bilaterally. There is no surrounding erythema or drainage along the nail sites. Hyperkeratotic lesion present bilateral hallux left worse on the left than theright. No underlying ulceration, drainage or any clinical signs of infection present. No open lesions or pre-ulcerative lesions are identified. No other areas of tenderness bilateral lower extremities. No overlying edema, erythema, increased warmth. No pain with calf compression, swelling, warmth, erythema.  Assessment: Patient presents with symptomatic onychomycosis, hyperkeratotic lesions  Plan: -Treatment options including alternatives, risks, complications were discussed -Nails sharply debrided 10 without complication/bleeding. -Hyperkeratotic lesions sharply debrided 2 without complications or bleeding. -Discussed daily foot inspection. If there are any changes, to call the office immediately.  -Follow-up in 3 months or sooner if any problems are to arise. In the meantime, encouraged to call the office with any questions, concerns, changes symptoms.  Ovid CurdMatthew Wagoner, DPM

## 2017-03-04 LAB — CUP PACEART REMOTE DEVICE CHECK
Date Time Interrogation Session: 20180613200947
MDC IDC PG IMPLANT DT: 20160921

## 2017-03-04 NOTE — Progress Notes (Signed)
Carelink summary report received. Battery status OK. Normal device function. No new symptom episodes, tachy episodes, brady, or pause episodes. No new AF episodes. Monthly summary reports and ROV/PRN 

## 2017-03-23 ENCOUNTER — Ambulatory Visit (INDEPENDENT_AMBULATORY_CARE_PROVIDER_SITE_OTHER): Payer: Medicare HMO | Admitting: *Deleted

## 2017-03-23 DIAGNOSIS — I639 Cerebral infarction, unspecified: Secondary | ICD-10-CM

## 2017-03-26 NOTE — Progress Notes (Signed)
Carelink Summary Report / Loop Recorder 

## 2017-03-29 LAB — CUP PACEART REMOTE DEVICE CHECK
Date Time Interrogation Session: 20180713221231
MDC IDC PG IMPLANT DT: 20160921

## 2017-03-30 ENCOUNTER — Other Ambulatory Visit: Payer: Self-pay | Admitting: Family Medicine

## 2017-04-02 ENCOUNTER — Ambulatory Visit (INDEPENDENT_AMBULATORY_CARE_PROVIDER_SITE_OTHER): Payer: Medicare HMO | Admitting: Internal Medicine

## 2017-04-02 ENCOUNTER — Encounter: Payer: Self-pay | Admitting: Internal Medicine

## 2017-04-02 ENCOUNTER — Other Ambulatory Visit (INDEPENDENT_AMBULATORY_CARE_PROVIDER_SITE_OTHER): Payer: Medicare HMO

## 2017-04-02 DIAGNOSIS — I10 Essential (primary) hypertension: Secondary | ICD-10-CM | POA: Diagnosis not present

## 2017-04-02 DIAGNOSIS — G301 Alzheimer's disease with late onset: Secondary | ICD-10-CM | POA: Diagnosis not present

## 2017-04-02 DIAGNOSIS — E1169 Type 2 diabetes mellitus with other specified complication: Secondary | ICD-10-CM

## 2017-04-02 DIAGNOSIS — F411 Generalized anxiety disorder: Secondary | ICD-10-CM

## 2017-04-02 DIAGNOSIS — I6310 Cerebral infarction due to embolism of unspecified precerebral artery: Secondary | ICD-10-CM

## 2017-04-02 DIAGNOSIS — E669 Obesity, unspecified: Secondary | ICD-10-CM | POA: Diagnosis not present

## 2017-04-02 DIAGNOSIS — D509 Iron deficiency anemia, unspecified: Secondary | ICD-10-CM

## 2017-04-02 DIAGNOSIS — F02818 Dementia in other diseases classified elsewhere, unspecified severity, with other behavioral disturbance: Secondary | ICD-10-CM

## 2017-04-02 DIAGNOSIS — E1159 Type 2 diabetes mellitus with other circulatory complications: Secondary | ICD-10-CM

## 2017-04-02 DIAGNOSIS — F0281 Dementia in other diseases classified elsewhere with behavioral disturbance: Secondary | ICD-10-CM | POA: Diagnosis not present

## 2017-04-02 LAB — BASIC METABOLIC PANEL
BUN: 26 mg/dL — ABNORMAL HIGH (ref 6–23)
CHLORIDE: 108 meq/L (ref 96–112)
CO2: 25 meq/L (ref 19–32)
CREATININE: 0.99 mg/dL (ref 0.40–1.20)
Calcium: 9 mg/dL (ref 8.4–10.5)
GFR: 70.19 mL/min (ref >60.00)
Glucose, Bld: 108 mg/dL — ABNORMAL HIGH (ref 70–99)
POTASSIUM: 3.9 meq/L (ref 3.5–5.1)
SODIUM: 142 meq/L (ref 135–145)

## 2017-04-02 LAB — HEMOGLOBIN A1C: Hgb A1c MFr Bld: 5.9 % (ref 4.6–6.5)

## 2017-04-02 MED ORDER — ZOSTER VAC RECOMB ADJUVANTED 50 MCG/0.5ML IM SUSR: 0.5000 mL | Freq: Once | INTRAMUSCULAR | 1 refills | Status: AC

## 2017-04-02 MED ORDER — MEMANTINE HCL-DONEPEZIL HCL ER 28-10 MG PO CP24: 1.0000 | ORAL_CAPSULE | Freq: Every day | ORAL | 11 refills | Status: DC

## 2017-04-02 NOTE — Progress Notes (Signed)
Subjective:  Patient ID: Tracy [name] Plain, female    DOB: 12/24/1940  Age: 76 y.o. MRN: [id number]  CC: No chief complaint on file.   HPI Tracy [name] Orndoff presents for HTN, dementia, CVA  Outpatient Medications Prior to Visit  Medication Sig Dispense Refill  . acetaminophen (TYLENOL) 325 MG tablet Take 650 mg by mouth every 6 (six) hours as needed for pain.    Marland Kitchen. aspirin EC 81 MG tablet Take 81 mg by mouth daily.    . cetaphil (CETAPHIL) lotion Apply 1 application topically 2 (two) times daily.    . cholecalciferol (VITAMIN [name]) 1000 UNITS tablet Take 1,000 Units by mouth daily.    . CVS VITAMIN B12 1000 MCG tablet TAKE 1 TABLET EVERY DAY 90 tablet 1  . donepezil (ARICEPT) 10 MG tablet Take 1 tablet (10 mg total) by mouth at bedtime. 90 tablet 3  . escitalopram (LEXAPRO) 10 MG tablet Take 1 tablet (10 mg total) by mouth daily. 90 tablet 3  . esomeprazole (NEXIUM) 40 MG capsule TAKE ONE CAPSULE EVERY DAY 30 capsule 5  . gabapentin (NEURONTIN) 100 MG capsule Take 2 capsules (200 mg total) by mouth at bedtime. 180 capsule 0  . glucose blood test strip One touch Verio Flex.  Use as instructed to test sugars daily and prn.  E11.59 100 each 12  . Ibuprofen-Famotidine 800-26.6 MG TABS Take 1 tablet by mouth 2 (two) times daily as needed (for pain).    . lubiprostone (AMITIZA) 24 MCG capsule Take 24 mcg by mouth 2 (two) times daily as needed for constipation.    . metFORMIN (GLUCOPHAGE) 500 MG tablet TAKE 1 TABLET (500 MG TOTAL) BY MOUTH DAILY WITH BREAKFAST. 90 tablet 3  . spironolactone (ALDACTONE) 25 MG tablet TAKE 1 TABLET (25 MG TOTAL) BY MOUTH DAILY. 90 tablet 3   No facility-administered medications prior to visit.     ROS Review of Systems  Constitutional: Negative for activity change, appetite change, chills, fatigue and unexpected weight change.  HENT: Negative for congestion, mouth sores and sinus pressure.   Eyes: Negative for visual disturbance.  Respiratory: Negative for cough  and chest tightness.   Gastrointestinal: Negative for abdominal pain and nausea.  Genitourinary: Negative for difficulty urinating, frequency and vaginal pain.  Musculoskeletal: Positive for arthralgias and back pain. Negative for gait problem.  Skin: Negative for pallor and rash.  Neurological: Negative for dizziness, tremors, weakness, numbness and headaches.  Psychiatric/Behavioral: Positive for decreased concentration. Negative for confusion and sleep disturbance. The patient is nervous/anxious.     Objective:  BP 126/84 (BP Location: Right Arm, Patient Position: Sitting, Cuff Size: Normal)   Pulse 73   Temp 98.1 F (36.7 C) (Oral)   Ht 5\' 6"  (1.676 m)   Wt 173 lb (78.5 kg)   SpO2 98%   BMI 27.92 kg/m   BP Readings from Last 3 Encounters:  04/02/17 126/84  12/04/16 130/88  11/24/16 128/88    Wt Readings from Last 3 Encounters:  04/02/17 173 lb (78.5 kg)  12/04/16 170 lb 4 oz (77.2 kg)  11/24/16 169 lb (76.7 kg)    Physical Exam  Constitutional: She appears well-developed. No distress.  HENT:  Head: Normocephalic.  Right Ear: External ear normal.  Left Ear: External ear normal.  Nose: Nose normal.  Mouth/Throat: Oropharynx is clear and moist.  Eyes: Pupils are equal, round, and reactive to light. Conjunctivae are normal. Right eye exhibits no discharge. Left eye exhibits no discharge.  Neck: Normal  range of motion. Neck supple. No JVD present. No tracheal deviation present. No thyromegaly present.  Cardiovascular: Normal rate, regular rhythm and normal heart sounds.   Pulmonary/Chest: No stridor. No respiratory distress. She has no wheezes.  Abdominal: Soft. Bowel sounds are normal. She exhibits no distension and no mass. There is no tenderness. There is no rebound and no guarding.  Musculoskeletal: She exhibits no edema or tenderness.  Lymphadenopathy:    She has no cervical adenopathy.  Neurological: She displays normal reflexes. No cranial nerve deficit. She  exhibits normal muscle tone. Coordination normal.  Skin: No rash noted. No erythema.  Psychiatric: She has a normal mood and affect. Her behavior is normal. Judgment normal.    Lab Results  Component Value Date   WBC 6.0 05/17/2016   HGB 12.9 05/17/2016   HCT 38.3 05/17/2016   PLT 312.0 05/17/2016   GLUCOSE 90 12/04/2016   CHOL 129 10/27/2014   TRIG 82 10/27/2014   HDL 45 10/27/2014   LDLCALC 68 10/27/2014   ALT 8 05/17/2016   AST 14 05/17/2016   NA 143 12/04/2016   K 4.3 12/04/2016   CL 105 12/04/2016   CREATININE 1.06 12/04/2016   BUN 26 (H) 12/04/2016   CO2 30 12/04/2016   TSH 4.305 10/28/2014   INR 1.12 02/05/2015   HGBA1C 5.9 12/04/2016   MICROALBUR <0.7 05/17/2016    Dg Cervical Spine Complete  Result Date: 11/24/2016 CLINICAL DATA:  Left-sided neck pain for 2 months, no known injury, initial encounter EXAM: CERVICAL SPINE - COMPLETE 4+ VIEW COMPARISON:  None. FINDINGS: Seven cervical segments are well visualized. Vertebral body height is well maintained. Disc space narrowing is noted at C4-5 and C5-6 with associated osteophytic changes. Bilateral neural foraminal narrowing is noted at these levels as well. No acute fracture or acute facet abnormality is noted. The odontoid is within normal limits. No soft tissue abnormality is seen. IMPRESSION: Degenerative change without acute abnormality. Electronically Signed   By: Alcide Clever M.[name].   On: 11/24/2016 15:42    Assessment & Plan:   There are no diagnoses linked to this encounter. I am having Ms. Modesto maintain her acetaminophen, cholecalciferol, aspirin EC, glucose blood, cetaphil, Ibuprofen-Famotidine, lubiprostone, donepezil, escitalopram, metFORMIN, spironolactone, CVS VITAMIN B12, esomeprazole, and gabapentin.  No orders of the defined types were placed in this encounter.    Follow-up: No Follow-up on file.  Sonda Primes, MD

## 2017-04-02 NOTE — Assessment & Plan Note (Signed)
lexapro

## 2017-04-02 NOTE — Assessment & Plan Note (Signed)
Spironolactone

## 2017-04-02 NOTE — Patient Instructions (Signed)
MC well w/Jill 

## 2017-04-02 NOTE — Assessment & Plan Note (Signed)
Labs

## 2017-04-02 NOTE — Assessment & Plan Note (Signed)
Labs Metformin 

## 2017-04-02 NOTE — Assessment & Plan Note (Signed)
Worse. Will d/c Aricept, start Namzaric

## 2017-04-02 NOTE — Assessment & Plan Note (Signed)
Baby ASA qd

## 2017-04-03 NOTE — Telephone Encounter (Signed)
Refill done.  

## 2017-04-23 ENCOUNTER — Ambulatory Visit (INDEPENDENT_AMBULATORY_CARE_PROVIDER_SITE_OTHER): Payer: Medicare HMO | Admitting: *Deleted

## 2017-04-23 ENCOUNTER — Telehealth: Payer: Self-pay | Admitting: Internal Medicine

## 2017-04-23 DIAGNOSIS — R002 Palpitations: Secondary | ICD-10-CM

## 2017-04-24 ENCOUNTER — Ambulatory Visit: Payer: Medicare HMO | Admitting: Family Medicine

## 2017-04-26 ENCOUNTER — Telehealth: Payer: Self-pay | Admitting: *Deleted

## 2017-04-26 NOTE — Telephone Encounter (Signed)
°  1. Has your device fired? no  2. Is you device beeping?   3. Are you experiencing draining or swelling at device site?   4. Are you calling to see if we received your device transmission?  5. Have you passed out?   Patient states that "machine had funny-looking pictures on it."

## 2017-04-26 NOTE — Telephone Encounter (Signed)
Attempted to reach patient.  No answer, phone rang continuously.

## 2017-04-27 NOTE — Telephone Encounter (Signed)
Spoke with patient.  Advised that monitor is updating automatically.  Advised we will call if we ever need a manual transmission.  Patient is appreciative and denies additional questions or concerns at this time.

## 2017-04-28 LAB — CUP PACEART REMOTE DEVICE CHECK
Implantable Pulse Generator Implant Date: 20160921
MDC IDC SESS DTM: 20180813001211

## 2017-04-28 NOTE — Progress Notes (Signed)
Carelink summary report received. Battery status OK. Normal device function. No new symptom episodes, tachy episodes, brady, or pause episodes. No new AF episodes. Monthly summary reports and ROV/PRN 

## 2017-04-30 ENCOUNTER — Other Ambulatory Visit: Payer: Self-pay | Admitting: Internal Medicine

## 2017-05-04 ENCOUNTER — Other Ambulatory Visit: Payer: Self-pay | Admitting: Internal Medicine

## 2017-05-22 ENCOUNTER — Ambulatory Visit (INDEPENDENT_AMBULATORY_CARE_PROVIDER_SITE_OTHER): Payer: Medicare HMO | Admitting: *Deleted

## 2017-05-22 DIAGNOSIS — I639 Cerebral infarction, unspecified: Secondary | ICD-10-CM | POA: Diagnosis not present

## 2017-05-23 NOTE — Progress Notes (Signed)
Carelink Summary Report / Loop Recorder 

## 2017-05-24 ENCOUNTER — Telehealth: Payer: Self-pay | Admitting: Internal Medicine

## 2017-05-24 LAB — CUP PACEART REMOTE DEVICE CHECK
Implantable Pulse Generator Implant Date: 20160921
MDC IDC SESS DTM: 20180912004032

## 2017-05-24 MED ORDER — SPIRONOLACTONE 25 MG PO TABS: ORAL_TABLET | ORAL | 1 refills | Status: DC

## 2017-05-24 NOTE — Telephone Encounter (Signed)
Pt called in and needs refill on her spironolactone (ALDACTONE) 25 MG tablet [[id number]][[id number]]  Sent to mail order

## 2017-05-24 NOTE — Telephone Encounter (Signed)
Reviewed chart pt is up[facility]Raechel Chute/LMB

## 2017-05-31 ENCOUNTER — Ambulatory Visit: Payer: Medicare HMO | Admitting: Podiatry

## 2017-06-06 ENCOUNTER — Ambulatory Visit: Payer: Medicare HMO | Admitting: Internal Medicine

## 2017-06-11 NOTE — Progress Notes (Signed)
Cardiology Office Note Date:  06/12/2017  Patient ID:  Tracy [name], Tracy [name] 12-12-1940, MRN [id number] PCP:  Tresa Garter, MD  Electrophysiologist: Dr.Klein    Chief Complaint: annual visit  History of Present Illness: Tracy [name] is a 76 y.o. female with history of HTN, DM, RLS, CVA w/ILR in place, to dat, no AF.  She comes today to be seen for [name], last seen by him in Oct 2017, at that time, mention of palpitations though no ILR findings to explain them.    She is accompanied by her son, reports doing well.  Will feel some brief palpitations or fast HR when upset, otherwise no cardiac awareness.  Denies any CP or SOB, no dizziness, near syncope or syncope.  Lives a pretty sedentary life style, seems of choice.  She denies any particular incapacities or difficulties.   Device information: MDT ILR, implanted 06/02/15, cryptogenic stroke   Past Medical History:  Diagnosis Date  . Acute cholecystitis 08/10/2013   Lap chole on 08/13/13   . Anxiety   . Depression   . Diabetes mellitus without complication ([name])   . Hypertension   . Restless leg syndrome   . Stroke [facility])     Past Surgical History:  Procedure Laterality Date  . CHOLECYSTECTOMY N/A 08/13/2013   Procedure: LAPAROSCOPIC CHOLECYSTECTOMY ;  Surgeon: Currie Paris, MD;  Location: [facility] OR;  Service: General;  Laterality: N/A;  . EP IMPLANTABLE DEVICE N/A 06/02/2015   Procedure: Loop Recorder Insertion;  Surgeon: Duke Salvia, MD;  Location[facility] INVASIVE CV LAB;  Service: Cardiovascular;  Laterality: N/A;  . ERCP N/A 08/12/2013   Procedure: ENDOSCOPIC RETROGRADE CHOLANGIOPANCREATOGRAPHY (ERCP);  Surgeon: Theda Belfast, MD;  Location: [facility] ENDOSCOPY;  Service: Endoscopy;  Laterality: N/A;  . INCISIONAL HERNIA REPAIR N/A 08/20/2013   Procedure: HERNIA REPAIR INCISIONAL UMBILICAL;  Surgeon: Velora Heckler, MD;  Location: [facility] OR;  Service: General;  Laterality: N/A;  . SPHINCTEROTOMY  08/12/2013   Procedure: Dennison Mascot;  Surgeon: Theda Belfast, MD;  Location: [facility] ENDOSCOPY;  Service: Endoscopy;;    Current Outpatient Prescriptions  Medication Sig Dispense Refill  . acetaminophen (TYLENOL) 325 MG tablet Take 650 mg by mouth every 6 (six) hours as needed for pain.    Marland Kitchen aspirin EC 81 MG tablet Take 81 mg by mouth daily.    . cetaphil (CETAPHIL) lotion Apply 1 application topically 2 (two) times daily.    . cholecalciferol (VITAMIN D) 1000 UNITS tablet Take 1,000 Units by mouth daily.    . CVS VITAMIN B12 1000 MCG tablet TAKE 1 TABLET EVERY DAY 90 tablet 3  . donepezil (ARICEPT) 10 MG tablet Take 10 mg by mouth daily.    Marland Kitchen escitalopram (LEXAPRO) 10 MG tablet Take 1 tablet (10 mg total) by mouth daily. 90 tablet 3  . esomeprazole (NEXIUM) 40 MG capsule TAKE ONE CAPSULE EVERY DAY 30 capsule 5  . gabapentin (NEURONTIN) 100 MG capsule TAKE 2 CAPSULES (200 MG TOTAL) BY MOUTH AT BEDTIME. 180 capsule 1  . glucose blood test strip One touch Verio Flex.  Use as instructed to test sugars daily and prn.  E11.59 100 each 12  . Ibuprofen-Famotidine 800-26.6 MG TABS Take 1 tablet by mouth 2 (two) times daily as needed (for pain).    . lubiprostone (AMITIZA) 24 MCG capsule Take 24 mcg by mouth 2 (two) times daily as needed for constipation.    . metFORMIN (GLUCOPHAGE) 500 MG tablet TAKE 1 TABLET (500  MG TOTAL) BY MOUTH DAILY WITH BREAKFAST. 90 tablet 3  . spironolactone (ALDACTONE) 25 MG tablet TAKE 1 TABLET (25 MG TOTAL) BY MOUTH DAILY. 90 tablet 1   No current facility-administered medications for this visit.     Allergies:   Wellbutrin [bupropion]   Social History:  The patient  reports that she has never smoked. She has never used smokeless tobacco. She reports that she does not drink alcohol or use drugs.   Family History:  The patient's family history includes Stroke in her mother.  ROS:  Please see the history of present illness.    All other systems are reviewed and otherwise negative.     PHYSICAL EXAM:  VS:  BP 124/70   Pulse (!) 58   Ht  (1.676 m)   Wt 175 lb (79.4 kg)   BMI 28.25 kg/m  BMI: Body mass index is 28.25 kg/m. Well nourished, well developed, in no acute distress  HEENT: normocephalic, atraumatic  Neck: no JVD, carotid bruits or masses Cardiac:  RRR; no significant murmurs, no rubs, or gallops Lungs:  CTA b/l, no wheezing, rhonchi or rales  Abd: soft, nontender, obese MS: no deformity, age appropriate atrophy Ext: no edema  Skin: warm and dry, no rash Neuro:  No gross deficits appreciated Psych: euthymic mood, full affect  ILR site is stable, no tethering or discomfort   EKG:  Done today and reviewed by myself shows SB, 58bpm, PR , QRS 74ms, QTc ILR interrogation done today and reviewed by myself: battery is good, no observations to date, no AF  10/27/14: TTE Study Conclusions - Left ventricle: The cavity size was normal. Wall thickness was increased in a pattern of moderate LVH. Systolic function was normal. The estimated ejection fraction was in the range of 60% to 65%. Wall motion was normal; there were no regional wall motion abnormalities. Doppler parameters are consistent with abnormal left ventricular relaxation (grade 1 diastolic dysfunction). Recent Labs: 04/02/2017: BUN 26; Creatinine, Ser 0.99; Potassium 3.9; Sodium 142  No results found for requested labs within last 8760 hours.   CrCl cannot be calculated (Patient's most recent lab result is older than the maximum 21 days allowed.).   Wt Readings from Last 3 Encounters:  06/12/17 175 lb (79.4 kg)  04/02/17 173 lb (78.5 kg)  12/04/16 170 lb 4 oz (77.2 kg)     Other studies reviewed: Additional studies/records reviewed today include: summarized above    ASSESSMENT AND PLAN:  1. ILR     Site is stable, no observations of AF to date  2. HTN     Looks OK, no changes today  Discussed cardiac health, healthy eating habits, walking for exercise  to her capacity.  She reports her PMD monitors her labs and see him routinely.  Suggested discussion of statin should they feel indicated given CVA  Disposition: F/u with monthly ILR transmissions, annual visit, sooner if needed  Current medicines are reviewed at length with the patient today.  The patient did not have any concerns regarding medicines.  Norma Fredrickson, PA-C 06/12/2017 8:27 AM     [facility] [address] [place] [address] [phone] (office)  [phone] (fax)

## 2017-06-12 ENCOUNTER — Ambulatory Visit (INDEPENDENT_AMBULATORY_CARE_PROVIDER_SITE_OTHER): Payer: Medicare HMO | Admitting: Physician Assistant

## 2017-06-12 VITALS — BP 124/70 | HR 58 | Ht 66.0 in | Wt 175.0 lb

## 2017-06-12 DIAGNOSIS — I10 Essential (primary) hypertension: Secondary | ICD-10-CM

## 2017-06-12 DIAGNOSIS — R002 Palpitations: Secondary | ICD-10-CM | POA: Diagnosis not present

## 2017-06-12 DIAGNOSIS — Z4509 Encounter for adjustment and management of other cardiac device: Secondary | ICD-10-CM

## 2017-06-12 NOTE — Patient Instructions (Addendum)
Medication Instructions:   Your physician recommends that you continue on your current medications as directed. Please refer to the Current Medication list given to you today.  If you need a refill on your cardiac medications before your next appointment, please call your pharmacy.  Labwork: NONE ORDERED  TODAY    Testing/Procedures: NONE ORDERED  TODAY    Follow-Up: Your physician wants you to follow-up in: ONE YEAR WITH KLEIN/URUSY   You will receive a reminder letter in the mail two months in advance. If you don't receive a letter, please call our office to schedule the follow-up appointment.      Any Other Special Instructions Will Be Listed Below (If Applicable).

## 2017-06-20 ENCOUNTER — Ambulatory Visit (INDEPENDENT_AMBULATORY_CARE_PROVIDER_SITE_OTHER): Payer: Medicare HMO | Admitting: Internal Medicine

## 2017-06-20 ENCOUNTER — Encounter: Payer: Self-pay | Admitting: Internal Medicine

## 2017-06-20 ENCOUNTER — Telehealth: Payer: Self-pay | Admitting: *Deleted

## 2017-06-20 VITALS — BP 124/72 | HR 64 | Temp 97.7°F | Ht 66.0 in | Wt 175.0 lb

## 2017-06-20 DIAGNOSIS — Z0000 Encounter for general adult medical examination without abnormal findings: Secondary | ICD-10-CM

## 2017-06-20 DIAGNOSIS — Z515 Encounter for palliative care: Secondary | ICD-10-CM | POA: Insufficient documentation

## 2017-06-20 NOTE — Assessment & Plan Note (Signed)
Here for medicare wellness/physical  Diet: heart healthy  Physical activity: not sedentary  Depression/mood screen: anxiety Hearing: decreased to whispered voice  Visual acuity: grossly normal w/glasses, performs annual eye exam  ADLs: capable  Fall risk: low Home safety: good  Cognitive evaluation: intact to orientation, naming, recall and repetition  EOL planning: adv directives, full code/ I agree  I have personally reviewed and have noted  1. The patient's medical, surgical and social history  2. Their use of alcohol, tobacco or illicit drugs  3. Their current medications and supplements  4. The patient's functional ability including ADL's, fall risks, home safety risks and hearing or visual impairment.  5. Diet and physical activities  6. Evidence for depression or mood disorders 7. The roster of all physicians providing medical care to patient - is listed in the Snapshot section of the chart and reviewed today.    Today patient counseled on age appropriate routine health concerns for screening and prevention, each reviewed and up to date or declined. Immunizations reviewed and up to date or declined. Labs ordered and reviewed. Risk factors for depression reviewed and negative. Hearing function and visual acuity are intact. ADLs screened and addressed as needed. Functional ability and level of safety reviewed and appropriate. Education, counseling and referrals performed based on assessed risks today. Patient provided with a copy of personalized plan for preventive services.

## 2017-06-20 NOTE — Progress Notes (Signed)
Pre visit review using our clinic review tool, if applicable. No additional management support is needed unless otherwise documented below in the visit note. 

## 2017-06-20 NOTE — Progress Notes (Addendum)
Subjective:   Tracy [name] is a 76 y.o. female who presents for an Initial Medicare Annual Wellness Visit.  Review of Systems    No ROS.  Medicare Wellness Visit. Additional risk factors are reflected in the social history.   Cardiac Risk Factors include: advanced age (>78men, >26 women);diabetes mellitus;dyslipidemia;hypertension;sedentary lifestyle Sleep patterns: has restless sleep, has frequent nighttime awakenings, gets up 1-2 times nightly to void and sleeps 4-5 hours nightly. Patient reports insomnia issues, discussed recommended sleep tips and stress reduction tips.    Home Safety/Smoke Alarms: Feels safe in home. Smoke alarms in place.  Living environment; residence and Firearm Safety: 1-story house/ trailer, equipment: Medical laboratory scientific officer, Type: [place] and Walkers, Type: Agricultural consultant, no firearms. Lives with husband, no needs for DME, good support system Seat Belt Safety/Bike Helmet: Wears seat belt.       Objective:    Today's Vitals   06/20/17 1427  BP: 124/72  Pulse: 64  Temp: 97.7 F (36.5 C)  TempSrc: Oral  SpO2: 99%  Weight: 175 lb (79.4 kg)  Height:  (1.676 m)   Body mass index is 28.25 kg/m.   Current Medications (verified) Outpatient Encounter Prescriptions as of 06/20/2017  Medication Sig  . acetaminophen (TYLENOL) 325 MG tablet Take 650 mg by mouth every 6 (six) hours as needed for pain.  Marland Kitchen aspirin EC 81 MG tablet Take 81 mg by mouth daily.  . cetaphil (CETAPHIL) lotion Apply 1 application topically 2 (two) times daily.  . cholecalciferol (VITAMIN D) 1000 UNITS tablet Take 1,000 Units by mouth daily.  . CVS VITAMIN B12 1000 MCG tablet TAKE 1 TABLET EVERY DAY  . donepezil (ARICEPT) 10 MG tablet Take 10 mg by mouth daily.  Marland Kitchen escitalopram (LEXAPRO) 10 MG tablet Take 1 tablet (10 mg total) by mouth daily.  Marland Kitchen esomeprazole (NEXIUM) 40 MG capsule TAKE ONE CAPSULE EVERY DAY  . gabapentin (NEURONTIN) 100 MG capsule TAKE 2 CAPSULES (200 MG TOTAL) BY  MOUTH AT BEDTIME.  Marland Kitchen glucose blood test strip One touch Verio Flex.  Use as instructed to test sugars daily and prn.  E11.59  . Ibuprofen-Famotidine 800-26.6 MG TABS Take 1 tablet by mouth 2 (two) times daily as needed (for pain).  . lubiprostone (AMITIZA) 24 MCG capsule Take 24 mcg by mouth 2 (two) times daily as needed for constipation.  . meclizine (ANTIVERT) 12.5 MG tablet Take 12.5 mg by mouth as needed for dizziness.  . metFORMIN (GLUCOPHAGE) 500 MG tablet TAKE 1 TABLET (500 MG TOTAL) BY MOUTH DAILY WITH BREAKFAST.  Marland Kitchen spironolactone (ALDACTONE) 25 MG tablet TAKE 1 TABLET (25 MG TOTAL) BY MOUTH DAILY.   No facility-administered encounter medications on file as of 06/20/2017.     Allergies (verified) Wellbutrin [bupropion]   History: Past Medical History:  Diagnosis Date  . Acute cholecystitis 08/10/2013   Lap chole on 08/13/13   . Anxiety   . Depression   . Diabetes mellitus without complication ([name])   . Hypertension   . Restless leg syndrome   . Stroke [facility])    Past Surgical History:  Procedure Laterality Date  . CHOLECYSTECTOMY N/A 08/13/2013   Procedure: LAPAROSCOPIC CHOLECYSTECTOMY ;  Surgeon: Currie Paris, MD;  Location: [facility] OR;  Service: General;  Laterality: N/A;  . EP IMPLANTABLE DEVICE N/A 06/02/2015   Procedure: Loop Recorder Insertion;  Surgeon: Duke Salvia, MD;  Location: [facility] INVASIVE CV LAB;  Service: Cardiovascular;  Laterality: N/A;  . ERCP N/A 08/12/2013   Procedure:  ENDOSCOPIC RETROGRADE CHOLANGIOPANCREATOGRAPHY (ERCP);  Surgeon: Theda Belfast, MD;  Location: [facility] ENDOSCOPY;  Service: Endoscopy;  Laterality: N/A;  . INCISIONAL HERNIA REPAIR N/A 08/20/2013   Procedure: HERNIA REPAIR INCISIONAL UMBILICAL;  Surgeon: Velora Heckler, MD;  Location: [facility] OR;  Service: General;  Laterality: N/A;  . SPHINCTEROTOMY  08/12/2013   Procedure: Dennison Mascot;  Surgeon: Theda Belfast, MD;  Location: [facility] ENDOSCOPY;  Service: Endoscopy;;   Family History  Problem Relation  Age of Onset  . Stroke Mother    Social History   Occupational History  . retired     Designer, fashion/clothing   Social History Main Topics  . Smoking status: Never Smoker  . Smokeless tobacco: Never Used  . Alcohol use No  . Drug use: No  . Sexual activity: Not on file    Tobacco Counseling Counseling given: Not Answered   Activities of Daily Living In your present state of health, do you have any difficulty performing the following activities: 06/20/2017  Hearing? N  Vision? N  Difficulty concentrating or making decisions? Y  Walking or climbing stairs? Y  Dressing or bathing? N  Doing errands, shopping? Y  Preparing Food and eating ? Y  Using the Toilet? N  In the past six months, have you accidently leaked urine? N  Do you have problems with loss of bowel control? N  Managing your Medications? N  Managing your Finances? N  Housekeeping or managing your Housekeeping? Y  Some recent data might be hidden    Immunizations and Health Maintenance Immunization History  Administered Date(s) Administered  . Influenza, High Dose Seasonal PF 06/20/2015, 05/17/2016  . Influenza,inj,Quad PF,6+ Mos 08/12/2013, 06/02/2014  . Pneumococcal Conjugate-13 12/04/2016  . Pneumococcal Polysaccharide-23 08/12/2013   Health Maintenance Due  Topic Date Due  . TETANUS/TDAP  07/15/1960  . DEXA SCAN  07/15/2006  . OPHTHALMOLOGY EXAM  01/09/2017  . FOOT EXAM  05/17/2017  . URINE MICROALBUMIN  05/17/2017    Patient Care Team: Plotnikov, Georgina Quint, MD as PCP - General (Internal Medicine)  Indicate any recent Medical Services you may have received from other than [facility] providers in the past year (date may be approximate).     Assessment:   This is a routine wellness examination for Tracy [name]. Physical assessment deferred to PCP.   Hearing/Vision screen Hearing Screening Comments: Able to hear conversational tones w/o difficulty. No issues reported.  Passed whisper test Vision Screening Comments:  appointment yearly   Dietary issues and exercise activities discussed: Current Exercise Habits: The patient does not participate in regular exercise at present (chair exercises provided for patient), Exercise limited by: orthopedic condition(s) Diet (meal preparation, eat out, water intake, caffeinated beverages, dairy products, fruits and vegetables): in general, a "healthy" diet  , states she snacks and does not eat meals, drinks primarily coke and sugary drinks.   Reviewed heart healthy and diabetic diet, encouraged patient to increase daily water intake. Patient states she is not interested in diet education at this time.     Goals    None     Depression Screen PHQ 2/9 Scores 06/20/2017 06/20/2017 12/21/2015 11/04/2014  PHQ - 2 Score 4 1 0 0  PHQ- 9 Score 11 - - -    Fall Risk Fall Risk  06/20/2017 06/20/2017 12/21/2015 07/19/2015 11/04/2014  Falls in the past year? Yes Yes No Yes Yes  Number falls in past yr: 2 or more 2 or more - 2 or more 1  Injury with Fall? No No -  No -  Risk Factor Category  High Fall Risk - - - -  Risk for fall due to : - - - - History of fall(s);Other (Comment)  Follow up Falls prevention discussed;Education provided - - - -    Cognitive Function: MMSE - Mini Mental State Exam 06/20/2017 01/12/2015  Not completed: Refused -  Orientation to time - 2  Orientation to Place - 4  Registration - 3  Attention/ Calculation - 1  Recall - 0  Language- name 2 objects - 2  Language- repeat - 1  Language- follow 3 step command - 3  Language- read & follow direction - 1  Write a sentence - 1  Copy design - 0  Total score - 18        Screening Tests Health Maintenance  Topic Date Due  . TETANUS/TDAP  07/15/1960  . DEXA SCAN  07/15/2006  . OPHTHALMOLOGY EXAM  01/09/2017  . FOOT EXAM  05/17/2017  . URINE MICROALBUMIN  05/17/2017  . INFLUENZA VACCINE  12/10/2017 (Originally 04/11/2017)  . HEMOGLOBIN A1C  10/03/2017  . PNA vac Low Risk Adult  Completed       Plan:    Patient became impatient and did not want to continue the wellness visit.  Therefore, plans, goals, and screening needs were not completed during this visit.  I have personally reviewed and noted the following in the patient's chart:   . Medical and social history . Use of alcohol, tobacco or illicit drugs  . Current medications and supplements . Functional ability and status . Nutritional status . Physical activity . Advanced directives . List of other physicians . Vitals . Screenings to include cognitive, depression, and falls . Referrals and appointments  In addition, I have reviewed and discussed with patient certain preventive protocols, quality metrics, and best practice recommendations. A written personalized care plan for preventive services as well as general preventive health recommendations were provided to patient.     Wanda Plump, RN   06/20/2017    Medical screening examination/treatment/procedure(s) were performed by non-physician practitioner and as supervising physician I was immediately available for consultation/collaboration. I agree with above. Jacinta Shoe, MD

## 2017-06-20 NOTE — Telephone Encounter (Signed)
During AWV, patient expressed that she felt anxious and depressed. She asked for medication to help with anxiety and would like to have a referral to counseling or psychiatry.

## 2017-06-20 NOTE — Progress Notes (Signed)
Subjective:  Patient ID: Tracy [name], female    DOB: [birth year]  Age: 76 y.o. MRN: [id number]  CC: No chief complaint on file.   HPI CATALEYAH COLBORN presents for a well exam and a 3 mo f/u - dementia. Comes w/son  Outpatient Medications Prior to Visit  Medication Sig Dispense Refill  . acetaminophen (TYLENOL) 325 MG tablet Take 650 mg by mouth every 6 (six) hours as needed for pain.    Marland Kitchen aspirin EC 81 MG tablet Take 81 mg by mouth daily.    . cetaphil (CETAPHIL) lotion Apply 1 application topically 2 (two) times daily.    . cholecalciferol (VITAMIN D) 1000 UNITS tablet Take 1,000 Units by mouth daily.    . CVS VITAMIN B12 1000 MCG tablet TAKE 1 TABLET EVERY DAY 90 tablet 3  . donepezil (ARICEPT) 10 MG tablet Take 10 mg by mouth daily.    Marland Kitchen escitalopram (LEXAPRO) 10 MG tablet Take 1 tablet (10 mg total) by mouth daily. 90 tablet 3  . esomeprazole (NEXIUM) 40 MG capsule TAKE ONE CAPSULE EVERY DAY 30 capsule 5  . gabapentin (NEURONTIN) 100 MG capsule TAKE 2 CAPSULES (200 MG TOTAL) BY MOUTH AT BEDTIME. 180 capsule 1  . glucose blood test strip One touch Verio Flex.  Use as instructed to test sugars daily and prn.  E11.59 100 each 12  . Ibuprofen-Famotidine 800-26.6 MG TABS Take 1 tablet by mouth 2 (two) times daily as needed (for pain).    . lubiprostone (AMITIZA) 24 MCG capsule Take 24 mcg by mouth 2 (two) times daily as needed for constipation.    . metFORMIN (GLUCOPHAGE) 500 MG tablet TAKE 1 TABLET (500 MG TOTAL) BY MOUTH DAILY WITH BREAKFAST. 90 tablet 3  . spironolactone (ALDACTONE) 25 MG tablet TAKE 1 TABLET (25 MG TOTAL) BY MOUTH DAILY. 90 tablet 1   No facility-administered medications prior to visit.     ROS Review of Systems  Constitutional: Positive for fatigue. Negative for activity change, appetite change, chills and unexpected weight change.  HENT: Negative for congestion, mouth sores and sinus pressure.   Eyes: Negative for visual disturbance.  Respiratory:  Negative for cough and chest tightness.   Gastrointestinal: Negative for abdominal pain and nausea.  Genitourinary: Negative for difficulty urinating, frequency and vaginal pain.  Musculoskeletal: Positive for back pain. Negative for gait problem.  Skin: Negative for pallor and rash.  Neurological: Negative for dizziness, tremors, weakness, numbness and headaches.  Psychiatric/Behavioral: Positive for confusion and decreased concentration. Negative for sleep disturbance and suicidal ideas. The patient is nervous/anxious.     Objective:  BP 124/72 (BP Location: Left Arm, Patient Position: Sitting, Cuff Size: Large)   Pulse 64   Temp 97.7 F (36.5 C) (Oral)   Ht  (1.676 m)   Wt 175 lb (79.4 kg)   SpO2 99%   BMI 28.25 kg/m   BP Readings from Last 3 Encounters:  06/20/17 124/72  06/12/17 124/70  04/02/17 126/84    Wt Readings from Last 3 Encounters:  06/20/17 175 lb (79.4 kg)  06/12/17 175 lb (79.4 kg)  04/02/17 173 lb (78.5 kg)    Physical Exam  Constitutional: She appears well-developed. No distress.  HENT:  Head: Normocephalic.  Right Ear: External ear normal.  Left Ear: External ear normal.  Nose: Nose normal.  Mouth/Throat: Oropharynx is clear and moist.  Eyes: Pupils are equal, round, and reactive to light. Conjunctivae are normal. Right eye exhibits no discharge. Left eye exhibits  no discharge.  Neck: Normal range of motion. Neck supple. No JVD present. No tracheal deviation present. No thyromegaly present.  Cardiovascular: Normal rate, regular rhythm and normal heart sounds.   Pulmonary/Chest: No stridor. No respiratory distress. She has no wheezes.  Abdominal: Soft. Bowel sounds are normal. She exhibits no distension and no mass. There is no tenderness. There is no rebound and no guarding.  Musculoskeletal: She exhibits no edema or tenderness.  Lymphadenopathy:    She has no cervical adenopathy.  Neurological: She displays normal reflexes. No cranial nerve  deficit. She exhibits normal muscle tone. Coordination abnormal.  Skin: No rash noted. No erythema.  Psychiatric: She has a normal mood and affect. Her behavior is normal. Judgment and thought content normal.  Obese Caries  Lab Results  Component Value Date   WBC 6.0 05/17/2016   HGB 12.9 05/17/2016   HCT 38.3 05/17/2016   PLT 312.0 05/17/2016   GLUCOSE 108 (H) 04/02/2017   CHOL 129 10/27/2014   TRIG 82 10/27/2014   HDL 45 10/27/2014   LDLCALC 68 10/27/2014   ALT 8 05/17/2016   AST 14 05/17/2016   NA 142 04/02/2017   K 3.9 04/02/2017   CL 108 04/02/2017   CREATININE 0.99 04/02/2017   BUN 26 (H) 04/02/2017   CO2 25 04/02/2017   TSH 4.305 10/28/2014   INR 1.12 02/05/2015   HGBA1C 5.9 04/02/2017   MICROALBUR <0.7 05/17/2016    Dg Cervical Spine Complete  Result Date: 11/24/2016 CLINICAL DATA:  Left-sided neck pain for 2 months, no known injury, initial encounter EXAM: CERVICAL SPINE - COMPLETE 4+ VIEW COMPARISON:  None. FINDINGS: Seven cervical segments are well visualized. Vertebral body height is well maintained. Disc space narrowing is noted at C4-5 and C5-6 with associated osteophytic changes. Bilateral neural foraminal narrowing is noted at these levels as well. No acute fracture or acute facet abnormality is noted. The odontoid is within normal limits. No soft tissue abnormality is seen. IMPRESSION: Degenerative change without acute abnormality. Electronically Signed   By: Alcide Clever M.D.   On: 11/24/2016 15:42    Assessment & Plan:   There are no diagnoses linked to this encounter. I am having Ms. Carstens maintain her acetaminophen, cholecalciferol, aspirin EC, glucose blood, cetaphil, Ibuprofen-Famotidine, lubiprostone, escitalopram, metFORMIN, gabapentin, esomeprazole, CVS VITAMIN B12, spironolactone, and donepezil.  No orders of the defined types were placed in this encounter.    Follow-up: No Follow-up on file.  Sonda Primes, MD

## 2017-06-21 ENCOUNTER — Ambulatory Visit (INDEPENDENT_AMBULATORY_CARE_PROVIDER_SITE_OTHER): Payer: Medicare HMO | Admitting: *Deleted

## 2017-06-21 DIAGNOSIS — I639 Cerebral infarction, unspecified: Secondary | ICD-10-CM | POA: Diagnosis not present

## 2017-06-22 NOTE — Progress Notes (Signed)
Carelink Summary Report / Loop Recorder 

## 2017-06-26 LAB — CUP PACEART REMOTE DEVICE CHECK
Date Time Interrogation Session: 20181012014016
Implantable Pulse Generator Implant Date: 20160921

## 2017-07-04 ENCOUNTER — Other Ambulatory Visit: Payer: Self-pay | Admitting: Internal Medicine

## 2017-07-06 NOTE — Telephone Encounter (Signed)
Routing to dr plotnikov, please advise, thanks 

## 2017-07-09 ENCOUNTER — Ambulatory Visit: Payer: Medicare HMO | Admitting: Internal Medicine

## 2017-07-23 ENCOUNTER — Ambulatory Visit (INDEPENDENT_AMBULATORY_CARE_PROVIDER_SITE_OTHER): Payer: Medicare HMO | Admitting: *Deleted

## 2017-07-23 DIAGNOSIS — I639 Cerebral infarction, unspecified: Secondary | ICD-10-CM | POA: Diagnosis not present

## 2017-07-23 NOTE — Progress Notes (Signed)
Carelink Summary Report / Loop Recorder 

## 2017-07-30 ENCOUNTER — Other Ambulatory Visit: Payer: Self-pay | Admitting: Internal Medicine

## 2017-07-30 LAB — CUP PACEART REMOTE DEVICE CHECK
Date Time Interrogation Session: 20181111024652
MDC IDC PG IMPLANT DT: 20160921

## 2017-07-31 ENCOUNTER — Other Ambulatory Visit (INDEPENDENT_AMBULATORY_CARE_PROVIDER_SITE_OTHER): Payer: Medicare HMO

## 2017-07-31 ENCOUNTER — Ambulatory Visit: Payer: Medicare HMO | Admitting: Internal Medicine

## 2017-07-31 ENCOUNTER — Encounter: Payer: Self-pay | Admitting: Internal Medicine

## 2017-07-31 VITALS — BP 122/78 | HR 68 | Temp 98.0°F | Ht 66.0 in | Wt 174.0 lb

## 2017-07-31 DIAGNOSIS — E669 Obesity, unspecified: Secondary | ICD-10-CM

## 2017-07-31 DIAGNOSIS — E1159 Type 2 diabetes mellitus with other circulatory complications: Secondary | ICD-10-CM

## 2017-07-31 DIAGNOSIS — Z23 Encounter for immunization: Secondary | ICD-10-CM | POA: Diagnosis not present

## 2017-07-31 DIAGNOSIS — K921 Melena: Secondary | ICD-10-CM

## 2017-07-31 DIAGNOSIS — F02818 Dementia in other diseases classified elsewhere, unspecified severity, with other behavioral disturbance: Secondary | ICD-10-CM

## 2017-07-31 DIAGNOSIS — I10 Essential (primary) hypertension: Secondary | ICD-10-CM

## 2017-07-31 DIAGNOSIS — F0281 Dementia in other diseases classified elsewhere with behavioral disturbance: Secondary | ICD-10-CM

## 2017-07-31 DIAGNOSIS — D509 Iron deficiency anemia, unspecified: Secondary | ICD-10-CM | POA: Diagnosis not present

## 2017-07-31 DIAGNOSIS — G301 Alzheimer's disease with late onset: Secondary | ICD-10-CM | POA: Diagnosis not present

## 2017-07-31 DIAGNOSIS — F411 Generalized anxiety disorder: Secondary | ICD-10-CM | POA: Diagnosis not present

## 2017-07-31 DIAGNOSIS — E1169 Type 2 diabetes mellitus with other specified complication: Secondary | ICD-10-CM | POA: Diagnosis not present

## 2017-07-31 LAB — CBC WITH DIFFERENTIAL/PLATELET
Basophils Absolute: 0.1 10*3/uL (ref 0.0–0.1)
Basophils Relative: 1 % (ref 0.0–3.0)
EOS ABS: 0.3 10*3/uL (ref 0.0–0.7)
Eosinophils Relative: 5 % (ref 0.0–5.0)
HCT: 40 % (ref 36.0–46.0)
HEMOGLOBIN: 13 g/dL (ref 12.0–15.0)
LYMPHS ABS: 2.5 10*3/uL (ref 0.7–4.0)
Lymphocytes Relative: 46.5 % — ABNORMAL HIGH (ref 12.0–46.0)
MCHC: 32.5 g/dL (ref 30.0–36.0)
MCV: 94.7 fl (ref 78.0–100.0)
MONO ABS: 0.3 10*3/uL (ref 0.1–1.0)
Monocytes Relative: 5.4 % (ref 3.0–12.0)
NEUTROS PCT: 42.1 % — AB (ref 43.0–77.0)
Neutro Abs: 2.2 10*3/uL (ref 1.4–7.7)
Platelets: 295 10*3/uL (ref 150.0–400.0)
RBC: 4.22 Mil/uL (ref 3.87–5.11)
RDW: 15 % (ref 11.5–15.5)
WBC: 5.3 10*3/uL (ref 4.0–10.5)

## 2017-07-31 LAB — BASIC METABOLIC PANEL
BUN: 21 mg/dL (ref 6–23)
CO2: 29 mEq/L (ref 19–32)
CREATININE: 1 mg/dL (ref 0.40–1.20)
Calcium: 9.5 mg/dL (ref 8.4–10.5)
Chloride: 106 mEq/L (ref 96–112)
GFR: 69.32 mL/min (ref >60.00)
Glucose, Bld: 96 mg/dL (ref 70–99)
Potassium: 4.7 mEq/L (ref 3.5–5.1)
Sodium: 143 mEq/L (ref 135–145)

## 2017-07-31 LAB — HEMOGLOBIN A1C: Hgb A1c MFr Bld: 5.8 % (ref 4.6–6.5)

## 2017-07-31 NOTE — Assessment & Plan Note (Signed)
Namzaric 

## 2017-07-31 NOTE — Progress Notes (Signed)
Subjective:  Patient ID: Tracy [name], female    DOB: 02/25/1941  Age: 76 y.o. MRN: [id number]  CC: No chief complaint on file.   HPI Tracy [name] presents for blood in the stool  And dark stools of unknown duration and frequency. She is a poor historian..colonoscopy in 2017 - internal hemorrhoids... F/u HTN, DM, memory issues, anxiety f/u  Outpatient Medications Prior to Visit  Medication Sig Dispense Refill  . acetaminophen (TYLENOL) 325 MG tablet Take 650 mg by mouth every 6 (six) hours as needed for pain.    Marland Kitchen. aspirin EC 81 MG tablet Take 81 mg by mouth daily.    . cetaphil (CETAPHIL) lotion Apply 1 application topically 2 (two) times daily.    . cholecalciferol (VITAMIN D) 1000 UNITS tablet Take 1,000 Units by mouth daily.    . CVS VITAMIN B12 1000 MCG tablet TAKE 1 TABLET EVERY DAY 90 tablet 3  . donepezil (ARICEPT) 10 MG tablet Take 10 mg by mouth daily.    Marland Kitchen. escitalopram (LEXAPRO) 10 MG tablet Take 1 tablet (10 mg total) by mouth daily. 90 tablet 3  . esomeprazole (NEXIUM) 40 MG capsule TAKE ONE CAPSULE EVERY DAY 30 capsule 5  . gabapentin (NEURONTIN) 100 MG capsule TAKE 2 CAPSULES (200 MG TOTAL) BY MOUTH AT BEDTIME. 180 capsule 1  . glucose blood test strip One touch Verio Flex.  Use as instructed to test sugars daily and prn.  E11.59 100 each 12  . Ibuprofen-Famotidine 800-26.6 MG TABS Take 1 tablet by mouth 2 (two) times daily as needed (for pain).    . lubiprostone (AMITIZA) 24 MCG capsule Take 24 mcg by mouth 2 (two) times daily as needed for constipation.    . meclizine (ANTIVERT) 12.5 MG tablet Take 12.5 mg by mouth as needed for dizziness.    . metFORMIN (GLUCOPHAGE) 500 MG tablet TAKE 1 TABLET (500 MG TOTAL) BY MOUTH DAILY WITH BREAKFAST. 90 tablet 3  . spironolactone (ALDACTONE) 25 MG tablet TAKE 1 TABLET (25 MG TOTAL) BY MOUTH DAILY. 90 tablet 1   No facility-administered medications prior to visit.     ROS Review of Systems  Constitutional: Negative for  activity change, appetite change, chills, fatigue and unexpected weight change.  HENT: Negative for congestion, mouth sores and sinus pressure.   Eyes: Negative for visual disturbance.  Respiratory: Negative for cough and chest tightness.   Gastrointestinal: Positive for blood in stool. Negative for abdominal pain, nausea and rectal pain.  Genitourinary: Negative for difficulty urinating, frequency and vaginal pain.  Musculoskeletal: Positive for arthralgias and back pain. Negative for gait problem.  Skin: Negative for pallor and rash.  Neurological: Negative for dizziness, tremors, weakness, numbness and headaches.  Psychiatric/Behavioral: Positive for confusion and decreased concentration. Negative for sleep disturbance and suicidal ideas. The patient is nervous/anxious.     Objective:  BP 122/78 (BP Location: Left Arm, Patient Position: Sitting, Cuff Size: Large)   Pulse 68   Temp 98 F (36.7 C) (Oral)   Ht 5\' 6"  (1.676 m)   Wt 174 lb (78.9 kg)   SpO2 98%   BMI 28.08 kg/m   BP Readings from Last 3 Encounters:  07/31/17 122/78  06/20/17 124/72  06/12/17 124/70    Wt Readings from Last 3 Encounters:  07/31/17 174 lb (78.9 kg)  06/20/17 175 lb (79.4 kg)  06/12/17 175 lb (79.4 kg)    Physical Exam  Constitutional: She appears well-developed. No distress.  HENT:  Head: Normocephalic.  Right Ear: External ear normal.  Left Ear: External ear normal.  Nose: Nose normal.  Mouth/Throat: Oropharynx is clear and moist.  Eyes: Conjunctivae are normal. Pupils are equal, round, and reactive to light. Right eye exhibits no discharge. Left eye exhibits no discharge.  Neck: Normal range of motion. Neck supple. No JVD present. No tracheal deviation present. No thyromegaly present.  Cardiovascular: Normal rate, regular rhythm and normal heart sounds.  Pulmonary/Chest: No stridor. No respiratory distress. She has no wheezes.  Abdominal: Soft. Bowel sounds are normal. She exhibits no  distension and no mass. There is no tenderness. There is no rebound and no guarding.  Musculoskeletal: She exhibits no edema or tenderness.  Lymphadenopathy:    She has no cervical adenopathy.  Neurological: She displays normal reflexes. No cranial nerve deficit. She exhibits normal muscle tone. Coordination abnormal.  Skin: No rash noted. No erythema.  Psychiatric: She has a normal mood and affect. Her behavior is normal. Thought content normal.    Lab Results  Component Value Date   WBC 6.0 05/17/2016   HGB 12.9 05/17/2016   HCT 38.3 05/17/2016   PLT 312.0 05/17/2016   GLUCOSE 108 (H) 04/02/2017   CHOL 129 10/27/2014   TRIG 82 10/27/2014   HDL 45 10/27/2014   LDLCALC 68 10/27/2014   ALT 8 05/17/2016   AST 14 05/17/2016   NA 142 04/02/2017   K 3.9 04/02/2017   CL 108 04/02/2017   CREATININE 0.99 04/02/2017   BUN 26 (H) 04/02/2017   CO2 25 04/02/2017   TSH 4.305 10/28/2014   INR 1.12 02/05/2015   HGBA1C 5.9 04/02/2017   MICROALBUR <0.7 05/17/2016    Dg Cervical Spine Complete  Result Date: 11/24/2016 CLINICAL DATA:  Left-sided neck pain for 2 months, no known injury, initial encounter EXAM: CERVICAL SPINE - COMPLETE 4+ VIEW COMPARISON:  None. FINDINGS: Seven cervical segments are well visualized. Vertebral body height is well maintained. Disc space narrowing is noted at C4-5 and C5-6 with associated osteophytic changes. Bilateral neural foraminal narrowing is noted at these levels as well. No acute fracture or acute facet abnormality is noted. The odontoid is within normal limits. No soft tissue abnormality is seen. IMPRESSION: Degenerative change without acute abnormality. Electronically Signed   By: Alcide CleverMark  Lukens M.D.   On: 11/24/2016 15:42    Assessment & Plan:   There are no diagnoses linked to this encounter. I am having Bing NeighborsYvonne D. [name] maintain her acetaminophen, cholecalciferol, aspirin EC, glucose blood, cetaphil, Ibuprofen-Famotidine, lubiprostone, escitalopram,  metFORMIN, gabapentin, esomeprazole, CVS VITAMIN B12, spironolactone, donepezil, and meclizine.  No orders of the defined types were placed in this encounter.    Follow-up: No Follow-up on file.  Sonda PrimesAlex Plotnikov, MD

## 2017-07-31 NOTE — Assessment & Plan Note (Addendum)
Recurrent - internal hemorroids Colon 2017 Dr Dr Myrtie Neitheranis CBC

## 2017-07-31 NOTE — Assessment & Plan Note (Signed)
On Spironolactone 

## 2017-07-31 NOTE — Assessment & Plan Note (Signed)
CBC

## 2017-07-31 NOTE — Assessment & Plan Note (Signed)
On Lexapro 

## 2017-07-31 NOTE — Assessment & Plan Note (Signed)
Metformin 

## 2017-08-15 NOTE — Telephone Encounter (Signed)
error 

## 2017-08-20 ENCOUNTER — Ambulatory Visit (INDEPENDENT_AMBULATORY_CARE_PROVIDER_SITE_OTHER): Payer: Medicare HMO | Admitting: *Deleted

## 2017-08-20 DIAGNOSIS — I639 Cerebral infarction, unspecified: Secondary | ICD-10-CM

## 2017-08-21 NOTE — Progress Notes (Signed)
Carelink Summary Report / Loop Recorder 

## 2017-08-27 LAB — CUP PACEART REMOTE DEVICE CHECK
Date Time Interrogation Session: 20181211031115
MDC IDC PG IMPLANT DT: 20160921

## 2017-09-19 ENCOUNTER — Ambulatory Visit (INDEPENDENT_AMBULATORY_CARE_PROVIDER_SITE_OTHER): Payer: Medicare HMO | Admitting: *Deleted

## 2017-09-19 DIAGNOSIS — I639 Cerebral infarction, unspecified: Secondary | ICD-10-CM | POA: Diagnosis not present

## 2017-09-20 NOTE — Progress Notes (Signed)
Carelink Summary Report / Loop Recorder 

## 2017-10-01 LAB — CUP PACEART REMOTE DEVICE CHECK
Date Time Interrogation Session: 20190110031153
Implantable Pulse Generator Implant Date: 20160921

## 2017-10-08 ENCOUNTER — Ambulatory Visit (INDEPENDENT_AMBULATORY_CARE_PROVIDER_SITE_OTHER): Payer: Medicare HMO | Admitting: Internal Medicine

## 2017-10-08 ENCOUNTER — Encounter: Payer: Self-pay | Admitting: Internal Medicine

## 2017-10-08 ENCOUNTER — Telehealth: Payer: Self-pay | Admitting: Internal Medicine

## 2017-10-08 VITALS — BP 126/74 | HR 61 | Temp 98.2°F | Ht 66.0 in | Wt 175.0 lb

## 2017-10-08 DIAGNOSIS — Z1231 Encounter for screening mammogram for malignant neoplasm of breast: Secondary | ICD-10-CM | POA: Diagnosis not present

## 2017-10-08 DIAGNOSIS — K2100 Gastro-esophageal reflux disease with esophagitis, without bleeding: Secondary | ICD-10-CM

## 2017-10-08 DIAGNOSIS — Z23 Encounter for immunization: Secondary | ICD-10-CM | POA: Diagnosis not present

## 2017-10-08 DIAGNOSIS — D509 Iron deficiency anemia, unspecified: Secondary | ICD-10-CM

## 2017-10-08 DIAGNOSIS — E1169 Type 2 diabetes mellitus with other specified complication: Secondary | ICD-10-CM | POA: Diagnosis not present

## 2017-10-08 DIAGNOSIS — I6310 Cerebral infarction due to embolism of unspecified precerebral artery: Secondary | ICD-10-CM

## 2017-10-08 DIAGNOSIS — F411 Generalized anxiety disorder: Secondary | ICD-10-CM

## 2017-10-08 DIAGNOSIS — E669 Obesity, unspecified: Secondary | ICD-10-CM

## 2017-10-08 DIAGNOSIS — Z1239 Encounter for other screening for malignant neoplasm of breast: Secondary | ICD-10-CM

## 2017-10-08 NOTE — Assessment & Plan Note (Signed)
Valerian root for anxiety 

## 2017-10-08 NOTE — Telephone Encounter (Signed)
Pt asked that they contact her on [place] at [phone] for her mammogram referral.

## 2017-10-08 NOTE — Telephone Encounter (Signed)
[facility]

## 2017-10-08 NOTE — Assessment & Plan Note (Signed)
ASA

## 2017-10-08 NOTE — Assessment & Plan Note (Signed)
CBC

## 2017-10-08 NOTE — Patient Instructions (Signed)
Valerian root for anxiety 

## 2017-10-08 NOTE — Addendum Note (Signed)
Addended by: Scarlett PrestoFRIEDENBACH, Ester Mabe on: 10/08/2017 10:35 AM   Modules accepted: Orders

## 2017-10-08 NOTE — Assessment & Plan Note (Signed)
Nexium 

## 2017-10-08 NOTE — Assessment & Plan Note (Signed)
Metformin 

## 2017-10-08 NOTE — Progress Notes (Signed)
Subjective:  Patient ID: Tracy [name], female    DOB: 1940/10/31  Age: 77 y.o. MRN: [id number]  CC: No chief complaint on file.   HPI Tracy [name] presents for dementia, anxiety - Valerian root helped, HTN f/u C/o L breast dry skin  Outpatient Medications Prior to Visit  Medication Sig Dispense Refill  . acetaminophen (TYLENOL) 325 MG tablet Take 650 mg by mouth every 6 (six) hours as needed for pain.    Marland Kitchen aspirin EC 81 MG tablet Take 81 mg by mouth daily.    . cetaphil (CETAPHIL) lotion Apply 1 application topically 2 (two) times daily.    . cholecalciferol (VITAMIN D) 1000 UNITS tablet Take 1,000 Units by mouth daily.    . CVS VITAMIN B12 1000 MCG tablet TAKE 1 TABLET EVERY DAY 90 tablet 3  . donepezil (ARICEPT) 10 MG tablet TAKE 1 TABLET (10 MG TOTAL) BY MOUTH AT BEDTIME. 90 tablet 3  . escitalopram (LEXAPRO) 10 MG tablet TAKE 1 TABLET (10 MG TOTAL) BY MOUTH DAILY. 90 tablet 3  . esomeprazole (NEXIUM) 40 MG capsule TAKE ONE CAPSULE EVERY DAY 30 capsule 5  . gabapentin (NEURONTIN) 100 MG capsule TAKE 2 CAPSULES (200 MG TOTAL) BY MOUTH AT BEDTIME. 180 capsule 1  . glucose blood test strip One touch Verio Flex.  Use as instructed to test sugars daily and prn.  E11.59 100 each 12  . Ibuprofen-Famotidine 800-26.6 MG TABS Take 1 tablet by mouth 2 (two) times daily as needed (for pain).    . lubiprostone (AMITIZA) 24 MCG capsule Take 24 mcg by mouth 2 (two) times daily as needed for constipation.    . meclizine (ANTIVERT) 12.5 MG tablet Take 12.5 mg by mouth as needed for dizziness.    . metFORMIN (GLUCOPHAGE) 500 MG tablet TAKE 1 TABLET  DAILY WITH BREAKFAST. 90 tablet 3  . spironolactone (ALDACTONE) 25 MG tablet TAKE 1 TABLET (25 MG TOTAL) BY MOUTH DAILY. 90 tablet 1   No facility-administered medications prior to visit.     ROS Review of Systems  Constitutional: Negative for activity change, appetite change, chills, fatigue and unexpected weight change.  HENT: Negative for  congestion, mouth sores and sinus pressure.   Eyes: Negative for visual disturbance.  Respiratory: Negative for cough and chest tightness.   Gastrointestinal: Negative for abdominal pain and nausea.  Genitourinary: Negative for difficulty urinating, frequency and vaginal pain.  Musculoskeletal: Positive for arthralgias and gait problem. Negative for back pain.  Skin: Negative for pallor and rash.  Neurological: Negative for dizziness, tremors, weakness, numbness and headaches.  Psychiatric/Behavioral: Positive for decreased concentration. Negative for confusion and sleep disturbance. The patient is nervous/anxious.     Objective:  BP 126/74 (BP Location: Left Arm, Patient Position: Sitting, Cuff Size: Large)   Pulse 61   Temp 98.2 F (36.8 C) (Oral)   Ht 5\' 6"  (1.676 m)   Wt 175 lb (79.4 kg)   SpO2 97%   BMI 28.25 kg/m   BP Readings from Last 3 Encounters:  10/08/17 126/74  07/31/17 122/78  06/20/17 124/72    Wt Readings from Last 3 Encounters:  10/08/17 175 lb (79.4 kg)  07/31/17 174 lb (78.9 kg)  06/20/17 175 lb (79.4 kg)    Physical Exam  Constitutional: She appears well-developed. No distress.  HENT:  Head: Normocephalic.  Right Ear: External ear normal.  Left Ear: External ear normal.  Nose: Nose normal.  Mouth/Throat: Oropharynx is clear and moist.  Eyes: Conjunctivae are  normal. Pupils are equal, round, and reactive to light. Right eye exhibits no discharge. Left eye exhibits no discharge.  Neck: Normal range of motion. Neck supple. No JVD present. No tracheal deviation present. No thyromegaly present.  Cardiovascular: Normal rate, regular rhythm and normal heart sounds.  Pulmonary/Chest: No stridor. No respiratory distress. She has no wheezes.  Abdominal: Soft. Bowel sounds are normal. She exhibits no distension and no mass. There is no tenderness. There is no rebound and no guarding.  Musculoskeletal: She exhibits no edema or tenderness.  Lymphadenopathy:     She has no cervical adenopathy.  Neurological: She displays normal reflexes. No cranial nerve deficit. She exhibits normal muscle tone. Coordination abnormal.  Skin: No rash noted. No erythema.  Psychiatric: She has a normal mood and affect. Her behavior is normal. Judgment and thought content normal.  flaky skin L upper anterior chest Alert, cooperative  Lab Results  Component Value Date   WBC 5.3 07/31/2017   HGB 13.0 07/31/2017   HCT 40.0 07/31/2017   PLT 295.0 07/31/2017   GLUCOSE 96 07/31/2017   CHOL 129 10/27/2014   TRIG 82 10/27/2014   HDL 45 10/27/2014   LDLCALC 68 10/27/2014   ALT 8 05/17/2016   AST 14 05/17/2016   NA 143 07/31/2017   K 4.7 07/31/2017   CL 106 07/31/2017   CREATININE 1.00 07/31/2017   BUN 21 07/31/2017   CO2 29 07/31/2017   TSH 4.305 10/28/2014   INR 1.12 02/05/2015   HGBA1C 5.8 07/31/2017   MICROALBUR <0.7 05/17/2016    Dg Cervical Spine Complete  Result Date: 11/24/2016 CLINICAL DATA:  Left-sided neck pain for 2 months, no known injury, initial encounter EXAM: CERVICAL SPINE - COMPLETE 4+ VIEW COMPARISON:  None. FINDINGS: Seven cervical segments are well visualized. Vertebral body height is well maintained. Disc space narrowing is noted at C4-5 and C5-6 with associated osteophytic changes. Bilateral neural foraminal narrowing is noted at these levels as well. No acute fracture or acute facet abnormality is noted. The odontoid is within normal limits. No soft tissue abnormality is seen. IMPRESSION: Degenerative change without acute abnormality. Electronically Signed   By: Alcide CleverMark  Lukens M.D.   On: 11/24/2016 15:42    Assessment & Plan:   There are no diagnoses linked to this encounter. I am having Bing NeighborsYvonne D. Kuyper maintain her acetaminophen, cholecalciferol, aspirin EC, glucose blood, cetaphil, Ibuprofen-Famotidine, lubiprostone, gabapentin, esomeprazole, CVS VITAMIN B12, spironolactone, meclizine, donepezil, escitalopram, and metFORMIN.  No orders  of the defined types were placed in this encounter.    Follow-up: No Follow-up on file.  Sonda PrimesAlex Plotnikov, MD

## 2017-10-19 ENCOUNTER — Ambulatory Visit (INDEPENDENT_AMBULATORY_CARE_PROVIDER_SITE_OTHER): Payer: Medicare HMO | Admitting: *Deleted

## 2017-10-19 DIAGNOSIS — I639 Cerebral infarction, unspecified: Secondary | ICD-10-CM | POA: Diagnosis not present

## 2017-10-22 NOTE — Progress Notes (Signed)
Carelink Summary Report / Loop Recorder 

## 2017-10-30 ENCOUNTER — Ambulatory Visit
Admission: RE | Admit: 2017-10-30 | Discharge: 2017-10-30 | Disposition: A | Discharge status: Home / Self Care | Payer: Medicare HMO | Source: Ambulatory Visit | Attending: Internal Medicine | Admitting: Internal Medicine

## 2017-10-30 DIAGNOSIS — Z1231 Encounter for screening mammogram for malignant neoplasm of breast: Secondary | ICD-10-CM | POA: Diagnosis not present

## 2017-11-13 LAB — CUP PACEART REMOTE DEVICE CHECK
Date Time Interrogation Session: 20190209031050
MDC IDC PG IMPLANT DT: 20160921

## 2017-11-14 ENCOUNTER — Encounter: Payer: Self-pay | Admitting: Physician Assistant

## 2017-11-20 ENCOUNTER — Encounter: Payer: Medicare HMO | Admitting: Physician Assistant

## 2017-11-20 NOTE — Progress Notes (Addendum)
Cardiology Office Note Date:  11/22/2017  Patient ID:  Tracy [name], DOB 1941-03-05, MRN [id number] PCP:  Tresa Garter, MD  Electrophysiologist: Dr.Klein    Chief Complaint: scheduled visit  History of Present Illness: Tracy [name] is a 77 y.o. female with history of HTN, DM, RLS, CVA w/ILR in place, to dat, no AF.  She comes today to be seen for [name], last seen by him in Oct 2017, at that time, mention of palpitations though no ILR findings to explain them.    I saw her in Oct 2018, she was accompanied by her son, reported doing well.  Mentioned some brief palpitations or fast HR when upset, otherwise no cardiac awareness.  Denied any CP or SOB, no dizziness, near syncope or syncope.  Reported a pretty sedentary life style, seems of choice.  She denied any particular incapacities or difficulties.  No changes were made and planned for an annual f/u.  She comes today accompanied by her son again.  Uncertain exactly what today's visit was for.  She mentions an infrequent little flutter, no overt palpitations, no trigger or regularity.  No CP or SOB, no dizziness, near syncope or syncope.  She denies regular exercise, states her son "gets on her" about walking more.   She mentions a patch of skin on her chest that is discolored, uncertain for how long, "a while".   Device information: MDT ILR, implanted 06/02/15, cryptogenic stroke   Past Medical History:  Diagnosis Date  . Acute cholecystitis 08/10/2013   Lap chole on 08/13/13   . Anxiety   . Depression   . Diabetes mellitus without complication ([name])   . Hypertension   . Restless leg syndrome   . Stroke [facility])     Past Surgical History:  Procedure Laterality Date  . CHOLECYSTECTOMY N/A 08/13/2013   Procedure: LAPAROSCOPIC CHOLECYSTECTOMY ;  Surgeon: Currie Paris, MD;  Location: [facility] OR;  Service: General;  Laterality: N/A;  . EP IMPLANTABLE DEVICE N/A 06/02/2015   Procedure: Loop Recorder Insertion;   Surgeon: Duke Salvia, MD;  Location: [facility] INVASIVE CV LAB;  Service: Cardiovascular;  Laterality: N/A;  . ERCP N/A 08/12/2013   Procedure: ENDOSCOPIC RETROGRADE CHOLANGIOPANCREATOGRAPHY (ERCP);  Surgeon: Theda Belfast, MD;  Location[facility] ENDOSCOPY;  Service: Endoscopy;  Laterality: N/A;  . INCISIONAL HERNIA REPAIR N/A 08/20/2013   Procedure: HERNIA REPAIR INCISIONAL UMBILICAL;  Surgeon: Velora Heckler, MD;  Location: [facility] OR;  Service: General;  Laterality: N/A;  . SPHINCTEROTOMY  08/12/2013   Procedure: Dennison Mascot;  Surgeon: Theda Belfast, MD;  Location: [facility] ENDOSCOPY;  Service: Endoscopy;;    Current Outpatient Medications  Medication Sig Dispense Refill  . acetaminophen (TYLENOL) 325 MG tablet Take 650 mg by mouth every 6 (six) hours as needed for pain.    Marland Kitchen aspirin EC 81 MG tablet Take 81 mg by mouth daily.    . cetaphil (CETAPHIL) lotion Apply 1 application topically 2 (two) times daily.    . cholecalciferol (VITAMIN D) 1000 UNITS tablet Take 1,000 Units by mouth daily.    . CVS VITAMIN B12 1000 MCG tablet TAKE 1 TABLET EVERY DAY 90 tablet 3  . donepezil (ARICEPT) 10 MG tablet TAKE 1 TABLET (10 MG TOTAL) BY MOUTH AT BEDTIME. 90 tablet 3  . escitalopram (LEXAPRO) 10 MG tablet TAKE 1 TABLET (10 MG TOTAL) BY MOUTH DAILY. 90 tablet 3  . esomeprazole (NEXIUM) 40 MG capsule TAKE ONE CAPSULE EVERY DAY 30 capsule 5  .  gabapentin (NEURONTIN) 100 MG capsule TAKE 2 CAPSULES (200 MG TOTAL) BY MOUTH AT BEDTIME. 180 capsule 1  . glucose blood test strip One touch Verio Flex.  Use as instructed to test sugars daily and prn.  E11.59 100 each 12  . Ibuprofen-Famotidine 800-26.6 MG TABS Take 1 tablet by mouth 2 (two) times daily as needed (for pain).    . lubiprostone (AMITIZA) 24 MCG capsule Take 24 mcg by mouth 2 (two) times daily as needed for constipation.    . meclizine (ANTIVERT) 12.5 MG tablet Take 12.5 mg by mouth as needed for dizziness.    . metFORMIN (GLUCOPHAGE) 500 MG tablet TAKE 1 TABLET   DAILY WITH BREAKFAST. 90 tablet 3  . spironolactone (ALDACTONE) 25 MG tablet TAKE 1 TABLET (25 MG TOTAL) BY MOUTH DAILY. 90 tablet 1   No current facility-administered medications for this visit.     Allergies:   Wellbutrin [bupropion]   Social History:  The patient  reports that  has never smoked. she has never used smokeless tobacco. She reports that she does not drink alcohol or use drugs.   Family History:  The patient's family history includes Stroke in her mother.  ROS:  Please see the history of present illness.    All other systems are reviewed and otherwise negative.   PHYSICAL EXAM:  VS:  BP 120/74   Pulse 68   Ht 5\' 6"  (1.676 m)   Wt 177 lb 9.6 oz (80.6 kg)   SpO2 94%   BMI 28.67 kg/m  BMI: Body mass index is 28.67 kg/m. Well nourished, well developed, in no acute distress  HEENT: normocephalic, atraumatic  Neck: no JVD, carotid bruits or masses Cardiac:  RRR; no significant murmurs, no rubs, or gallops Lungs:  CTA b/l, no wheezing, rhonchi or rales  Abd: soft, nontender, obese MS: no deformity, age appropriate atrophy Ext: no edema  Skin: warm and dry, no rash.  L chest superior to breast has patch of slightly darkened skin, no dimpling, no tethering, no skin breakdown or erythema, no near the ILR that is well inferior to this Neuro:  No gross deficits appreciated Psych: euthymic mood, full affect   ILR site is stable, no tethering or discomfort   EKG:  Not done today ILR interrogation done today and reviewed by myself: battery status is good, R waves 0.64mV, no AF or device observations  10/27/14: TTE Study Conclusions - Left ventricle: The cavity size was normal. Wall thickness was increased in a pattern of moderate LVH. Systolic function was normal. The estimated ejection fraction was in the range of 60% to 65%. Wall motion was normal; there were no regional wall motion abnormalities. Doppler parameters are consistent with abnormal left  ventricular relaxation (grade 1 diastolic dysfunction).   Recent Labs: 07/31/2017: BUN 21; Creatinine, Ser 1.00; Hemoglobin 13.0; Platelets 295.0; Potassium 4.7; Sodium 143  No results found for requested labs within last 8760 hours.   CrCl cannot be calculated (Patient's most recent lab result is older than the maximum 21 days allowed.).   Wt Readings from Last 3 Encounters:  11/22/17 177 lb 9.6 oz (80.6 kg)  10/08/17 175 lb (79.4 kg)  07/31/17 174 lb (78.9 kg)     Other studies reviewed: Additional studies/records reviewed today include: summarized above    ASSESSMENT AND PLAN:  1. ILR     no observations of AF to date  2. HTN     Looks OK, no changes today  3. Skin discoloration  Unclear, does not appear infected or inflamed, she is recommended to see her PMD    Disposition:   Continue monthly transmissions, back to EP in a year, sooner if needed.  Current medicines are reviewed at length with the patient today.  The patient did not have any concerns regarding medicines.  Tracy FredricksonSigned, Renee Ursuy, PA-C 11/22/2017 8:22 AM     [facility] [address] [place] [address] [phone] (office)  [phone] (fax)

## 2017-11-22 ENCOUNTER — Ambulatory Visit (INDEPENDENT_AMBULATORY_CARE_PROVIDER_SITE_OTHER): Payer: Medicare HMO | Admitting: *Deleted

## 2017-11-22 ENCOUNTER — Ambulatory Visit: Payer: Medicare HMO | Admitting: Physician Assistant

## 2017-11-22 ENCOUNTER — Encounter: Payer: Self-pay | Admitting: Physician Assistant

## 2017-11-22 VITALS — BP 120/74 | HR 68 | Ht 66.0 in | Wt 177.6 lb

## 2017-11-22 DIAGNOSIS — I10 Essential (primary) hypertension: Secondary | ICD-10-CM

## 2017-11-22 DIAGNOSIS — I639 Cerebral infarction, unspecified: Secondary | ICD-10-CM

## 2017-11-22 DIAGNOSIS — Z4509 Encounter for adjustment and management of other cardiac device: Secondary | ICD-10-CM | POA: Diagnosis not present

## 2017-11-22 NOTE — Patient Instructions (Addendum)
Medication Instructions:   Your physician recommends that you continue on your current medications as directed. Please refer to the Current Medication list given to you today.   If you need a refill on your cardiac medications before your next appointment, please call your pharmacy.  Labwork: NONE ORDERED  TODAY    Testing/Procedures: NONE ORDERED  TODAY    Follow-Up:  Your physician wants you to follow-up in: ONE YEAR WITH  URSUY  You will receive a reminder letter in the mail two months in advance. If you don't receive a letter, please call our office to schedule the follow-up appointment.     Any Other Special Instructions Will Be Listed Below (If Applicable).                                                                                                                                                 ' 

## 2017-11-22 NOTE — Progress Notes (Signed)
Carelink Summary Report / Loop Recorder 

## 2017-12-08 ENCOUNTER — Other Ambulatory Visit: Payer: Self-pay | Admitting: Internal Medicine

## 2017-12-17 ENCOUNTER — Other Ambulatory Visit: Payer: Self-pay | Admitting: Internal Medicine

## 2017-12-17 MED ORDER — SPIRONOLACTONE 25 MG PO TABS: ORAL_TABLET | ORAL | 0 refills | Status: DC

## 2017-12-17 NOTE — Telephone Encounter (Signed)
Copied from CRM #[id number]. Topic: Quick Communication - Rx Refill/Question °>> Dec 17, 2017 10:43 AM Sebastian, Jennifer S wrote: °Medication: spironolactone (ALDACTONE) 25 MG tablet,  °Has the patient contacted their pharmacy? Yes.   °(Agent: If no, request that the patient contact the pharmacy for the refill.) °Preferred Pharmacy (with phone number or street name): [facility] [address] [facility], [facility] needs a 90 day supply °Agent: Please be advised that RX refills may take up to 3 business days. We ask that you follow-up with your pharmacy. °

## 2017-12-18 ENCOUNTER — Telehealth: Payer: Self-pay | Admitting: Internal Medicine

## 2017-12-18 NOTE — Telephone Encounter (Signed)
[facility] called and spoke to [place]onMegan, Pharmacologistharmacy Technician, she verified receipt of the spironolactone 25 mg 90 tab/1 refill on 12/17/17.  [facility] and [facility] KremlinMeredith[facility], she verified receipt of the spironolactone and says it is ready for pick up. Patient called and made aware of above, she verbalized understanding.

## 2017-12-18 NOTE — Telephone Encounter (Signed)
Copied from CRM [phone]. Topic: Quick Communication - Rx Refill/Question >> Dec 17, 2017 10:43 AM Oneal GroutSebastian, Jennifer S wrote: Medication: spironolactone (ALDACTONE) 25 MG tablet,  Has the patient contacted their pharmacy? Yes.   (Agent: If no, request that the patient contact the pharmacy for the refill.) Preferred Pharmacy (with phone number or [facility] [address] [facility], [facility] day supply Agent: Please be advised that RX refills may take up to 3 business days. We ask that you follow-up with your pharmacy.

## 2017-12-24 ENCOUNTER — Other Ambulatory Visit: Payer: Self-pay | Admitting: Internal Medicine

## 2017-12-24 ENCOUNTER — Ambulatory Visit (INDEPENDENT_AMBULATORY_CARE_PROVIDER_SITE_OTHER): Payer: Medicare HMO | Admitting: *Deleted

## 2017-12-24 DIAGNOSIS — I639 Cerebral infarction, unspecified: Secondary | ICD-10-CM | POA: Diagnosis not present

## 2017-12-25 NOTE — Progress Notes (Signed)
Carelink Summary Report / Loop Recorder 

## 2017-12-31 LAB — CUP PACEART REMOTE DEVICE CHECK
Implantable Pulse Generator Implant Date: 20160921
MDC IDC SESS DTM: 20190314033933

## 2018-01-23 LAB — CUP PACEART REMOTE DEVICE CHECK
Date Time Interrogation Session: 20190416060916
MDC IDC PG IMPLANT DT: 20160921

## 2018-01-28 ENCOUNTER — Ambulatory Visit (INDEPENDENT_AMBULATORY_CARE_PROVIDER_SITE_OTHER): Payer: Medicare HMO | Admitting: *Deleted

## 2018-01-28 DIAGNOSIS — I639 Cerebral infarction, unspecified: Secondary | ICD-10-CM | POA: Diagnosis not present

## 2018-01-28 NOTE — Progress Notes (Signed)
Carelink Summary Report / Loop Recorder 

## 2018-02-05 ENCOUNTER — Ambulatory Visit: Payer: Medicare HMO | Admitting: Internal Medicine

## 2018-02-19 ENCOUNTER — Ambulatory Visit (INDEPENDENT_AMBULATORY_CARE_PROVIDER_SITE_OTHER): Payer: Medicare HMO | Admitting: Internal Medicine

## 2018-02-19 ENCOUNTER — Other Ambulatory Visit (INDEPENDENT_AMBULATORY_CARE_PROVIDER_SITE_OTHER): Payer: Medicare HMO

## 2018-02-19 ENCOUNTER — Encounter: Payer: Self-pay | Admitting: Internal Medicine

## 2018-02-19 VITALS — BP 116/68 | HR 81 | Temp 98.5°F | Ht 66.0 in | Wt 171.0 lb

## 2018-02-19 DIAGNOSIS — I10 Essential (primary) hypertension: Secondary | ICD-10-CM | POA: Diagnosis not present

## 2018-02-19 DIAGNOSIS — G301 Alzheimer's disease with late onset: Secondary | ICD-10-CM | POA: Diagnosis not present

## 2018-02-19 DIAGNOSIS — F411 Generalized anxiety disorder: Secondary | ICD-10-CM

## 2018-02-19 DIAGNOSIS — I6310 Cerebral infarction due to embolism of unspecified precerebral artery: Secondary | ICD-10-CM | POA: Diagnosis not present

## 2018-02-19 DIAGNOSIS — E669 Obesity, unspecified: Secondary | ICD-10-CM

## 2018-02-19 DIAGNOSIS — F0281 Dementia in other diseases classified elsewhere with behavioral disturbance: Secondary | ICD-10-CM

## 2018-02-19 DIAGNOSIS — K2100 Gastro-esophageal reflux disease with esophagitis, without bleeding: Secondary | ICD-10-CM

## 2018-02-19 DIAGNOSIS — F02818 Dementia in other diseases classified elsewhere, unspecified severity, with other behavioral disturbance: Secondary | ICD-10-CM

## 2018-02-19 DIAGNOSIS — E1169 Type 2 diabetes mellitus with other specified complication: Secondary | ICD-10-CM

## 2018-02-19 LAB — BASIC METABOLIC PANEL
BUN: 25 mg/dL — AB (ref 6–23)
CO2: 30 mEq/L (ref 19–32)
CREATININE: 1.15 mg/dL (ref 0.40–1.20)
Calcium: 9.8 mg/dL (ref 8.4–10.5)
Chloride: 105 mEq/L (ref 96–112)
GFR: 58.91 mL/min — AB (ref >60.00)
GLUCOSE: 101 mg/dL — AB (ref 70–99)
Potassium: 4.5 mEq/L (ref 3.5–5.1)
Sodium: 144 mEq/L (ref 135–145)

## 2018-02-19 LAB — HEMOGLOBIN A1C: Hgb A1c MFr Bld: 6.1 % (ref 4.6–6.5)

## 2018-02-19 LAB — CUP PACEART REMOTE DEVICE CHECK
Date Time Interrogation Session: 20190519114001
MDC IDC PG IMPLANT DT: 20160921

## 2018-02-19 MED ORDER — RANITIDINE HCL 150 MG PO TABS: 150.0000 mg | ORAL_TABLET | Freq: Two times a day (BID) | ORAL | 11 refills | Status: DC

## 2018-02-19 NOTE — Assessment & Plan Note (Signed)
A1c

## 2018-02-19 NOTE — Assessment & Plan Note (Signed)
Lexapro 

## 2018-02-19 NOTE — Assessment & Plan Note (Signed)
Worse Ranitidine 300 mg qd

## 2018-02-19 NOTE — Assessment & Plan Note (Signed)
No relapse 

## 2018-02-19 NOTE — Assessment & Plan Note (Addendum)
mild Alzheimer's - on Aricept

## 2018-02-19 NOTE — Progress Notes (Signed)
Subjective:  Patient ID: Tracy [name], female    DOB: [birth year]  Age: 77 y.o. MRN: [id number]  CC: No chief complaint on file.   HPI Tracy [name] presents for GERD s'x. Eating canned soups, hot dogs, peanuts etc F/u CVA, anxiety, memory loss  Outpatient Medications Prior to Visit  Medication Sig Dispense Refill  . acetaminophen (TYLENOL) 325 MG tablet Take 650 mg by mouth every 6 (six) hours as needed for pain.    Marland Kitchen aspirin EC 81 MG tablet Take 81 mg by mouth daily.    . cetaphil (CETAPHIL) lotion Apply 1 application topically 2 (two) times daily.    . cholecalciferol (VITAMIN D) 1000 UNITS tablet Take 1,000 Units by mouth daily.    . CVS VITAMIN B12 1000 MCG tablet TAKE 1 TABLET EVERY DAY 90 tablet 3  . donepezil (ARICEPT) 10 MG tablet TAKE 1 TABLET (10 MG TOTAL) BY MOUTH AT BEDTIME. 90 tablet 3  . escitalopram (LEXAPRO) 10 MG tablet TAKE 1 TABLET (10 MG TOTAL) BY MOUTH DAILY. 90 tablet 3  . esomeprazole (NEXIUM) 40 MG capsule TAKE 1 CAPSULE BY MOUTH EVERY DAY 30 capsule 5  . gabapentin (NEURONTIN) 100 MG capsule TAKE 2 CAPSULES (200 MG TOTAL) BY MOUTH AT BEDTIME. 180 capsule 1  . glucose blood test strip One touch Verio Flex.  Use as instructed to test sugars daily and prn.  E11.59 100 each 12  . Ibuprofen-Famotidine 800-26.6 MG TABS Take 1 tablet by mouth 2 (two) times daily as needed (for pain).    . lubiprostone (AMITIZA) 24 MCG capsule Take 24 mcg by mouth 2 (two) times daily as needed for constipation.    . meclizine (ANTIVERT) 12.5 MG tablet Take 12.5 mg by mouth as needed for dizziness.    . metFORMIN (GLUCOPHAGE) 500 MG tablet TAKE 1 TABLET  DAILY WITH BREAKFAST. 90 tablet 3  . spironolactone (ALDACTONE) 25 MG tablet TAKE 1 TABLET (25 MG TOTAL) BY MOUTH DAILY. 14 tablet 0   No facility-administered medications prior to visit.     ROS: Review of Systems  Constitutional: Negative for activity change, appetite change, chills, fatigue and unexpected weight change.    HENT: Negative for congestion, mouth sores and sinus pressure.   Eyes: Negative for visual disturbance.  Respiratory: Negative for cough and chest tightness.   Gastrointestinal: Negative for abdominal pain and nausea.  Genitourinary: Negative for difficulty urinating, frequency and vaginal pain.  Musculoskeletal: Negative for back pain and gait problem.  Skin: Negative for pallor and rash.  Neurological: Negative for dizziness, tremors, weakness, numbness and headaches.  Psychiatric/Behavioral: Positive for confusion and decreased concentration. Negative for sleep disturbance and suicidal ideas.  GERD  Objective:  BP 116/68 (BP Location: Left Arm, Patient Position: Sitting, Cuff Size: Large)   Pulse 81   Temp 98.5 F (36.9 C) (Oral)   Ht 5\' 6"  (1.676 m)   Wt 171 lb (77.6 kg)   SpO2 96%   BMI 27.60 kg/m   BP Readings from Last 3 Encounters:  02/19/18 116/68  11/22/17 120/74  10/08/17 126/74    Wt Readings from Last 3 Encounters:  02/19/18 171 lb (77.6 kg)  11/22/17 177 lb 9.6 oz (80.6 kg)  10/08/17 175 lb (79.4 kg)    Physical Exam  Constitutional: She appears well-developed. No distress.  HENT:  Head: Normocephalic.  Right Ear: External ear normal.  Left Ear: External ear normal.  Nose: Nose normal.  Mouth/Throat: Oropharynx is clear and moist.  Eyes: Pupils  are equal, round, and reactive to light. Conjunctivae are normal. Right eye exhibits no discharge. Left eye exhibits no discharge.  Neck: Normal range of motion. Neck supple. No JVD present. No tracheal deviation present. No thyromegaly present.  Cardiovascular: Normal rate, regular rhythm and normal heart sounds.  Pulmonary/Chest: No stridor. No respiratory distress. She has no wheezes.  Abdominal: Soft. Bowel sounds are normal. She exhibits no distension and no mass. There is no tenderness. There is no rebound and no guarding.  Musculoskeletal: She exhibits no edema or tenderness.  Lymphadenopathy:    She has  no cervical adenopathy.  Neurological: She displays normal reflexes. No cranial nerve deficit. She exhibits normal muscle tone. Coordination normal.  Skin: No rash noted. No erythema.  Psychiatric: She has a normal mood and affect. Her behavior is normal.    Lab Results  Component Value Date   WBC 5.3 07/31/2017   HGB 13.0 07/31/2017   HCT 40.0 07/31/2017   PLT 295.0 07/31/2017   GLUCOSE 96 07/31/2017   CHOL 129 10/27/2014   TRIG 82 10/27/2014   HDL 45 10/27/2014   LDLCALC 68 10/27/2014   ALT 8 05/17/2016   AST 14 05/17/2016   NA 143 07/31/2017   K 4.7 07/31/2017   CL 106 07/31/2017   CREATININE 1.00 07/31/2017   BUN 21 07/31/2017   CO2 29 07/31/2017   TSH 4.305 10/28/2014   INR 1.12 02/05/2015   HGBA1C 5.8 07/31/2017   MICROALBUR <0.7 05/17/2016    Mm Screening Breast Tomo Bilateral  Result Date: 10/30/2017 CLINICAL DATA:  Screening. EXAM: DIGITAL SCREENING BILATERAL MAMMOGRAM WITH TOMO AND CAD COMPARISON:  Previous exam(s). ACR Breast Density Category a: The breast tissue is almost entirely fatty. FINDINGS: There are no findings suspicious for malignancy. Images were processed with CAD. IMPRESSION: No mammographic evidence of malignancy. A result letter of this screening mammogram will be mailed directly to the patient. RECOMMENDATION: Screening mammogram in one year. (Code:SM-B-01Y) BI-RADS CATEGORY  1: Negative. Electronically Signed   By: Gerome Samavid  Williams III M.D   On: 10/30/2017 13:33    Assessment & Plan:   There are no diagnoses linked to this encounter.   No orders of the defined types were placed in this encounter.    Follow-up: No follow-ups on file.  Sonda PrimesAlex Sharief Wainwright, MD

## 2018-02-19 NOTE — Patient Instructions (Signed)

## 2018-02-19 NOTE — Assessment & Plan Note (Signed)
Low sodium labels discussed

## 2018-03-01 ENCOUNTER — Ambulatory Visit (INDEPENDENT_AMBULATORY_CARE_PROVIDER_SITE_OTHER): Payer: Medicare HMO | Admitting: *Deleted

## 2018-03-01 DIAGNOSIS — I639 Cerebral infarction, unspecified: Secondary | ICD-10-CM | POA: Diagnosis not present

## 2018-03-04 NOTE — Progress Notes (Signed)
Carelink Summary Report / Loop Recorder 

## 2018-04-03 ENCOUNTER — Ambulatory Visit (INDEPENDENT_AMBULATORY_CARE_PROVIDER_SITE_OTHER): Payer: Medicare HMO | Admitting: *Deleted

## 2018-04-03 DIAGNOSIS — I639 Cerebral infarction, unspecified: Secondary | ICD-10-CM | POA: Diagnosis not present

## 2018-04-03 NOTE — Progress Notes (Signed)
Carelink Summary Report / Loop Recorder 

## 2018-04-04 LAB — CUP PACEART REMOTE DEVICE CHECK
MDC IDC PG IMPLANT DT: 20160921
MDC IDC SESS DTM: 20190621113757

## 2018-05-02 ENCOUNTER — Other Ambulatory Visit: Payer: Self-pay | Admitting: Internal Medicine

## 2018-05-06 ENCOUNTER — Ambulatory Visit (INDEPENDENT_AMBULATORY_CARE_PROVIDER_SITE_OTHER): Payer: Medicare HMO | Admitting: *Deleted

## 2018-05-06 DIAGNOSIS — I639 Cerebral infarction, unspecified: Secondary | ICD-10-CM

## 2018-05-06 NOTE — Progress Notes (Signed)
Carelink Summary Report / Loop Recorder 

## 2018-05-11 ENCOUNTER — Encounter (HOSPITAL_COMMUNITY): Payer: Self-pay | Admitting: Emergency Medicine

## 2018-05-11 ENCOUNTER — Emergency Department (HOSPITAL_COMMUNITY)
Admission: EM | Admit: 2018-05-11 | Discharge: 2018-05-11 | Disposition: A | Discharge status: Home / Self Care | Payer: Medicare HMO | Attending: Emergency Medicine | Admitting: Emergency Medicine

## 2018-05-11 ENCOUNTER — Other Ambulatory Visit: Payer: Self-pay

## 2018-05-11 ENCOUNTER — Ambulatory Visit (HOSPITAL_COMMUNITY)
Admission: EM | Admit: 2018-05-11 | Discharge: 2018-05-11 | Disposition: A | Discharge status: Home / Self Care | Payer: Medicare HMO | Source: Home / Self Care

## 2018-05-11 ENCOUNTER — Emergency Department (HOSPITAL_COMMUNITY): Payer: Medicare HMO

## 2018-05-11 DIAGNOSIS — Z7984 Long term (current) use of oral hypoglycemic drugs: Secondary | ICD-10-CM | POA: Insufficient documentation

## 2018-05-11 DIAGNOSIS — F419 Anxiety disorder, unspecified: Secondary | ICD-10-CM | POA: Diagnosis not present

## 2018-05-11 DIAGNOSIS — Z9049 Acquired absence of other specified parts of digestive tract: Secondary | ICD-10-CM | POA: Diagnosis not present

## 2018-05-11 DIAGNOSIS — Z7982 Long term (current) use of aspirin: Secondary | ICD-10-CM | POA: Insufficient documentation

## 2018-05-11 DIAGNOSIS — Z79899 Other long term (current) drug therapy: Secondary | ICD-10-CM | POA: Insufficient documentation

## 2018-05-11 DIAGNOSIS — I10 Essential (primary) hypertension: Secondary | ICD-10-CM | POA: Insufficient documentation

## 2018-05-11 DIAGNOSIS — Z8673 Personal history of transient ischemic attack (TIA), and cerebral infarction without residual deficits: Secondary | ICD-10-CM | POA: Diagnosis not present

## 2018-05-11 DIAGNOSIS — Z95810 Presence of automatic (implantable) cardiac defibrillator: Secondary | ICD-10-CM | POA: Insufficient documentation

## 2018-05-11 DIAGNOSIS — R1084 Generalized abdominal pain: Secondary | ICD-10-CM | POA: Diagnosis not present

## 2018-05-11 DIAGNOSIS — K5900 Constipation, unspecified: Secondary | ICD-10-CM | POA: Diagnosis not present

## 2018-05-11 DIAGNOSIS — F329 Major depressive disorder, single episode, unspecified: Secondary | ICD-10-CM | POA: Insufficient documentation

## 2018-05-11 DIAGNOSIS — R109 Unspecified abdominal pain: Secondary | ICD-10-CM | POA: Diagnosis present

## 2018-05-11 DIAGNOSIS — E119 Type 2 diabetes mellitus without complications: Secondary | ICD-10-CM | POA: Insufficient documentation

## 2018-05-11 MED ORDER — POLYETHYLENE GLYCOL 3350 17 GM/SCOOP PO POWD: ORAL | 0 refills | Status: DC

## 2018-05-11 MED ORDER — DICYCLOMINE HCL 10 MG PO CAPS
10.0000 mg | ORAL_CAPSULE | Freq: Once | ORAL | Status: AC
  Administered 2018-05-11: 10 mg via ORAL
  Filled 2018-05-11: qty 1

## 2018-05-11 MED ORDER — SORBITOL 70 % SOLN
960.0000 mL | TOPICAL_OIL | Freq: Once | ORAL | Status: AC
  Administered 2018-05-11: 960 mL via RECTAL
  Filled 2018-05-11: qty 473

## 2018-05-11 NOTE — ED Triage Notes (Addendum)
Pt reports last bm was two weeks ago, has not taken anything for constipation. Abdomen soft and non tender, denies abd pain at this time

## 2018-05-11 NOTE — ED Notes (Signed)
Patient transported to X-ray 

## 2018-05-11 NOTE — ED Notes (Signed)
Signature pad unavailable at time of pt discharge. Pt verbalized understanding of d/c instructions and prescriptions. Pt denies any further requests.

## 2018-05-11 NOTE — Discharge Instructions (Addendum)
Contact a health care provider if:  You have pain that gets worse.  You have a fever.  You do not have a bowel movement after 4 days.  You vomit.  You are not hungry.  You lose weight.  You are bleeding from the anus.  You have thin, pencil-like stools.  Get help right away if:  You have a fever and your symptoms suddenly get worse.  You leak stool or have blood in your stool.  Your abdomen is bloated.  You have severe pain in your abdomen.  You feel dizzy or you faint.

## 2018-05-11 NOTE — ED Notes (Signed)
Pt produced large BM after receiving small amount of enema. Cammy CopaAbigail, PA notified.

## 2018-05-11 NOTE — ED Provider Notes (Signed)
[facility] Provider Note   CSN: [id number] Arrival date & time: 05/11/18  1047     History   Chief Complaint Chief Complaint  Patient presents with  . Constipation    HPI Tracy [name] is a 77 y.o. female who presents emergency department with complaint of abdominal pain.  Patient states that she has had some abdominal bloating and cramping along with rectal urgency.  She has not made a bowel movement for 2 weeks.  She states that normally she takes a bowel movement about every other day.  She has had bouts of significant constipation in the past.  She has not tried anything at home nor taken anything for constipation in the past 2 weeks.  She denies nausea, vomiting, diarrhea, fevers, chills, urinary symptoms.  Patient asks that we give her an enema here today.  HPI  Past Medical History:  Diagnosis Date  . Acute cholecystitis 08/10/2013   Lap chole on 08/13/13   . Anxiety   . Depression   . Diabetes mellitus without complication ([name])   . Hypertension   . Restless leg syndrome   . Stroke [facility]([name])     Patient Active Problem List   Diagnosis Date Noted  . Hematochezia 07/31/2017  . Well adult exam 06/20/2017  . Abdominal pain 02/26/2017  . Insomnia 02/26/2017  . Cerumen impaction 12/04/2016  . Cervical disc disorder with radiculopathy of cervical region 09/22/2016  . Left rotator cuff tear arthropathy 09/22/2016  . Left shoulder pain 09/01/2016  . Left wrist pain 09/01/2016  . GERD (gastroesophageal reflux disease) 04/25/2016  . Onychomycosis 03/06/2016  . Viral illness 12/21/2015  . Blood in stool 11/30/2015  . Tension headache 10/08/2015  . Subdural hematoma ([name]) 02/05/2015  . Palpitations 01/18/2015  . HLD (hyperlipidemia) 01/12/2015  . Stroke ([name]) 01/12/2015  . Anemia, iron deficiency 12/24/2014  . Generalized anxiety disorder 11/27/2014  . Abscess of right buttock 11/04/2014  . Diabetes mellitus type 2 in obese  ([name])   . SIRS (systemic inflammatory response syndrome) ([name]) 10/26/2014  . Umbilical hernia 02/03/2014  . Dementia 02/03/2014  . Loss of weight 10/06/2013  . Constipation, chronic 10/06/2013  . HTN (hypertension) 08/10/2013  . History of CVA (cerebrovascular accident) 08/10/2013    Past Surgical History:  Procedure Laterality Date  . CHOLECYSTECTOMY N/A 08/13/2013   Procedure: LAPAROSCOPIC CHOLECYSTECTOMY ;  Surgeon: Currie Parishristian J Streck, MD;  [facility];  Service: General;  Laterality: N/A;  . EP IMPLANTABLE DEVICE N/A 06/02/2015   Procedure: Loop Recorder Insertion;  Surgeon: Duke SalviaSteven C Klein, MD;  [facility];  Service: Cardiovascular;  Laterality: N/A;  . ERCP N/A 08/12/2013   Procedure: ENDOSCOPIC RETROGRADE CHOLANGIOPANCREATOGRAPHY (ERCP);  Surgeon: Theda BelfastPatrick D Hung, MD;  [facility];  Service: Endoscopy;  Laterality: N/A;  . INCISIONAL HERNIA REPAIR N/A 08/20/2013   Procedure: HERNIA REPAIR INCISIONAL UMBILICAL;  Surgeon: Velora Hecklerodd M Gerkin, MD;  [facility];  Service: General;  Laterality: N/A;  . SPHINCTEROTOMY  08/12/2013   Procedure: Dennison MascotSPHINCTEROTOMY;  Surgeon: Theda BelfastPatrick D Hung, MD;  Location[facility] END[facility];  Service: Endoscopy;;     OB History   None      Home Medications    Prior to Admission medications   Medication Sig Start Date End Date Taking? Authorizing Provider  acetaminophen (TYLENOL) 325 MG tablet Take 650 mg by mouth every 6 (six) hours as needed for pain.    [provider]  aspirin EC 81 MG tablet  Take 81 mg by mouth daily.    [provider]  cetaphil (CETAPHIL) lotion Apply 1 application topically 2 (two) times daily.    [provider]  cholecalciferol (VITAMIN D) 1000 UNITS tablet Take 1,000 Units by mouth daily.    [provider]  [facility] VITAMIN B12 1000 MCG tablet TAKE 1 TABLET EVERY DAY 05/02/18   Plotnikov, Georgina Quint, MD  donepezil (ARICEPT) 10 MG tablet TAKE 1 TABLET (10 MG TOTAL) BY MOUTH AT  BEDTIME. 08/05/17   Plotnikov, Georgina Quint, MD  escitalopram (LEXAPRO) 10 MG tablet TAKE 1 TABLET (10 MG TOTAL) BY MOUTH DAILY. 08/01/17   Plotnikov, Georgina Quint, MD  gabapentin (NEURONTIN) 100 MG capsule TAKE 2 CAPSULES (200 MG TOTAL) BY MOUTH AT BEDTIME. 04/03/17   Judi Saa, DO  glucose blood test strip One touch Verio Flex.  Use as instructed to test sugars daily and prn.  E11.59 11/19/15   Pincus Sanes, MD  Ibuprofen-Famotidine 800-26.6 MG TABS Take 1 tablet by mouth 2 (two) times daily as needed (for pain).    [provider]  lubiprostone (AMITIZA) 24 MCG capsule Take 24 mcg by mouth 2 (two) times daily as needed for constipation.    [provider]  meclizine (ANTIVERT) 12.5 MG tablet Take 12.5 mg by mouth as needed for dizziness.    [provider]  metFORMIN (GLUCOPHAGE) 500 MG tablet TAKE 1 TABLET  DAILY WITH BREAKFAST. 08/01/17   Plotnikov, Georgina Quint, MD  ranitidine (ZANTAC) 150 MG tablet Take 1 tablet (150 mg total) by mouth 2 (two) times daily. 02/19/18 02/19/19  Plotnikov, Georgina Quint, MD  spironolactone (ALDACTONE) 25 MG tablet TAKE 1 TABLET (25 MG TOTAL) BY MOUTH DAILY. 12/17/17   Plotnikov, Georgina Quint, MD    Family History Family History  Problem Relation Age of Onset  . Stroke Mother     Social History Social History   Tobacco Use  . Smoking status: Never Smoker  . Smokeless tobacco: Never Used  Substance Use Topics  . Alcohol use: No  . Drug use: No     Allergies   Wellbutrin [bupropion]   Review of Systems Review of Systems  Ten systems reviewed and are negative for acute change, except as noted in the HPI.   Physical Exam Updated Vital Signs BP 113/72 (BP Location: Right Arm)   Pulse 69   Temp 98.6 F (37 C) (Oral)   Resp 18   SpO2 98%   Physical Exam  Constitutional: She is oriented to person, place, and time. She appears well-developed and well-nourished. No distress.  HENT:  Head: Normocephalic and atraumatic.  Eyes:  Pupils are equal, round, and reactive to light. Conjunctivae and EOM are normal. No scleral icterus.  Neck: Normal range of motion.  Cardiovascular: Normal rate, regular rhythm and normal heart sounds. Exam reveals no gallop and no friction rub.  No murmur heard. Pulmonary/Chest: Effort normal and breath sounds normal. No respiratory distress.  Abdominal: Soft. Bowel sounds are normal. She exhibits distension. She exhibits no mass. There is tenderness (diffuse). There is no guarding.  Genitourinary:  Genitourinary Comments: Digital Rectal Exam reveals sphincter with good tone. No external hemorrhoids. No masses or fissures. Stool color is brown with no overt blood. Small amount of firm stool felt within the rectal vault.  Neurological: She is alert and oriented to person, place, and time.  Skin: Skin is warm and dry. She is not diaphoretic.  Psychiatric: Her behavior is normal.  Nursing note  and vitals reviewed.    ED Treatments / Results  Labs (all labs ordered are listed, but only abnormal results are displayed) Labs Reviewed - No data to display  EKG None  Radiology No results found.  Procedures Procedures (including critical care time)  Medications Ordered in ED Medications  dicyclomine (BENTYL) capsule 10 mg (has no administration in time range)  sorbitol, milk of mag, mineral oil, glycerin (SMOG) enema (has no administration in time range)     Initial Impression / Assessment and Plan / ED Course  I have reviewed the triage vital signs and the nursing notes.  Pertinent labs & imaging results that were available during my care of the patient were reviewed by me and considered in my medical decision making (see chart for details).  Clinical Course as of May 11 1432  Sat May 11, 2018  [id number] 77 year old female here with no bowel movement in a few days.  She has not tried anything for it.  She is complaining of some mild stomach pain.  On exam she is soft without masses  guarding or rebound.  She is getting an x-ray and then will see if she needs an enema or further work-up.   [MB]  1423 Personally reviewed the patient's acute abdominal film.  He appears to have a normal bowel gas pattern and a moderate colonic stool burden.  I agree with the radiologist's interpretation of this film.   [AH]  1429 Patient able to produce a large bowel movement after only a small amount of the enema was administered.  She is feeling improved.   [AH]    Clinical Course User Index [AH] Arthor Captain, PA-C [MB] Terrilee Files, MD    77 y/o F with c/o constipation and generalized with abdominal pain. The causes for generalized abdominal pain include but are not limited to gastritis, gastroenteritis, IBS, pancreatitis, peritonitis, intestinal ischemia, constipation, UTI, intestinal obstruction, perforated viscus, eg, peptic ulcer, appendix, gallbladder, diverticulitis, physical or sexual abuse, abdominal abscess, ruptured spleen, AAA, diabetic ketoacidosis, hypercalcemia, uremia, parasitic or other infection,  Yersinia, adrenal insufficiency,lead poisoning, iron toxicity, polyarteritis nodosa, Henoch-Schnlein purpura, porphyria, eg, acute intermittent porphyria, familial Mediterranean fever.  Patient had a large bowel movement here in the emergency department with minimal enema applied.  She is feeling greatly improved and appears appropriate for discharge at this time.  I doubt any other emergent cause of her abdominal distention and generalized tenderness.  Patient will be started on MiraLAX.  She will follow-up with her primary care physician.  I discussed return precautions the patient  Final Clinical Impressions(s) / ED Diagnoses   Final diagnoses:  Constipation, unspecified constipation type    ED Discharge Orders    None       Arthor Captain, PA-C 05/11/18 2206    Terrilee Files, MD 05/12/18 1010

## 2018-05-11 NOTE — ED Notes (Signed)
Pt here for severe abdominal pain and no BM for two weeks, pt sent to the ER.

## 2018-05-17 ENCOUNTER — Other Ambulatory Visit: Payer: Self-pay | Admitting: Internal Medicine

## 2018-05-20 LAB — CUP PACEART REMOTE DEVICE CHECK
Date Time Interrogation Session: 20190724121148
Implantable Pulse Generator Implant Date: 20160921

## 2018-05-21 ENCOUNTER — Other Ambulatory Visit (INDEPENDENT_AMBULATORY_CARE_PROVIDER_SITE_OTHER): Payer: Medicare HMO

## 2018-05-21 ENCOUNTER — Encounter: Payer: Self-pay | Admitting: Internal Medicine

## 2018-05-21 ENCOUNTER — Ambulatory Visit (INDEPENDENT_AMBULATORY_CARE_PROVIDER_SITE_OTHER): Payer: Medicare HMO | Admitting: Internal Medicine

## 2018-05-21 VITALS — BP 116/62 | HR 66 | Temp 98.0°F | Ht 66.0 in | Wt 166.0 lb

## 2018-05-21 DIAGNOSIS — K5901 Slow transit constipation: Secondary | ICD-10-CM

## 2018-05-21 DIAGNOSIS — E1169 Type 2 diabetes mellitus with other specified complication: Secondary | ICD-10-CM | POA: Diagnosis not present

## 2018-05-21 DIAGNOSIS — F0391 Unspecified dementia with behavioral disturbance: Secondary | ICD-10-CM

## 2018-05-21 DIAGNOSIS — Z23 Encounter for immunization: Secondary | ICD-10-CM

## 2018-05-21 DIAGNOSIS — F411 Generalized anxiety disorder: Secondary | ICD-10-CM

## 2018-05-21 DIAGNOSIS — E669 Obesity, unspecified: Secondary | ICD-10-CM | POA: Diagnosis not present

## 2018-05-21 DIAGNOSIS — E1159 Type 2 diabetes mellitus with other circulatory complications: Secondary | ICD-10-CM | POA: Diagnosis not present

## 2018-05-21 DIAGNOSIS — F03918 Unspecified dementia, unspecified severity, with other behavioral disturbance: Secondary | ICD-10-CM

## 2018-05-21 LAB — BASIC METABOLIC PANEL
BUN: 18 mg/dL (ref 6–23)
CALCIUM: 9 mg/dL (ref 8.4–10.5)
CO2: 28 mEq/L (ref 19–32)
CREATININE: 1.1 mg/dL (ref 0.40–1.20)
Chloride: 106 mEq/L (ref 96–112)
GFR: 61.97 mL/min (ref >60.00)
GLUCOSE: 100 mg/dL — AB (ref 70–99)
Potassium: 4.2 mEq/L (ref 3.5–5.1)
Sodium: 143 mEq/L (ref 135–145)

## 2018-05-21 LAB — HEMOGLOBIN A1C: Hgb A1c MFr Bld: 5.9 % (ref 4.6–6.5)

## 2018-05-21 MED ORDER — ARIPIPRAZOLE 2 MG PO TABS: 2.0000 mg | ORAL_TABLET | Freq: Every day | ORAL | 5 refills | Status: DC

## 2018-05-21 MED ORDER — BISACODYL 5 MG PO TBEC: 5.0000 mg | DELAYED_RELEASE_TABLET | Freq: Every day | ORAL | 5 refills | Status: DC | PRN

## 2018-05-21 NOTE — Assessment & Plan Note (Signed)
Metformin 

## 2018-05-21 NOTE — Addendum Note (Signed)
Addended by: Scarlett Presto on: 05/21/2018 08:37 AM   Modules accepted: Orders

## 2018-05-21 NOTE — Assessment & Plan Note (Signed)
Dulcolax prn

## 2018-05-21 NOTE — Progress Notes (Signed)
Subjective:  Patient ID: Tracy [name], female    DOB: [birth year]  Age: 77 y.o. MRN: [id number]  CC: No chief complaint on file.   HPI RICHA SHOR presents for anxiety, dementia C/o constipation Son is c/o a rage episodes every day x 1 year - worse  Outpatient Medications Prior to Visit  Medication Sig Dispense Refill  . acetaminophen (TYLENOL) 325 MG tablet Take 650 mg by mouth every 6 (six) hours as needed for pain.    Marland Kitchen aspirin EC 81 MG tablet Take 81 mg by mouth daily.    . cetaphil (CETAPHIL) lotion Apply 1 application topically 2 (two) times daily.    . cholecalciferol (VITAMIN D) 1000 UNITS tablet Take 1,000 Units by mouth daily.    . [facility] VITAMIN B12 1000 MCG tablet TAKE 1 TABLET EVERY DAY 90 tablet 3  . donepezil (ARICEPT) 10 MG tablet TAKE 1 TABLET (10 MG TOTAL) BY MOUTH AT BEDTIME. 90 tablet 3  . escitalopram (LEXAPRO) 10 MG tablet TAKE 1 TABLET (10 MG TOTAL) BY MOUTH DAILY. 90 tablet 3  . gabapentin (NEURONTIN) 100 MG capsule TAKE 2 CAPSULES (200 MG TOTAL) BY MOUTH AT BEDTIME. 180 capsule 1  . glucose blood test strip One touch Verio Flex.  Use as instructed to test sugars daily and prn.  E11.59 100 each 12  . Ibuprofen-Famotidine 800-26.6 MG TABS Take 1 tablet by mouth 2 (two) times daily as needed (for pain).    . lubiprostone (AMITIZA) 24 MCG capsule Take 24 mcg by mouth 2 (two) times daily as needed for constipation.    . meclizine (ANTIVERT) 12.5 MG tablet Take 12.5 mg by mouth as needed for dizziness.    . metFORMIN (GLUCOPHAGE) 500 MG tablet TAKE 1 TABLET  DAILY WITH BREAKFAST. 90 tablet 3  . polyethylene glycol powder (GLYCOLAX/MIRALAX) powder Take 1 capful in 10 oz of fluid 2 times daily until you are having daily soft stools 250 g 0  . ranitidine (ZANTAC) 150 MG tablet Take 1 tablet (150 mg total) by mouth 2 (two) times daily. 60 tablet 11  . spironolactone (ALDACTONE) 25 MG tablet TAKE 1 TABLET EVERY DAY 90 tablet 1   No facility-administered medications  prior to visit.     ROS: Review of Systems  Constitutional: Negative for activity change, appetite change, chills, fatigue and unexpected weight change.  HENT: Negative for congestion, mouth sores and sinus pressure.   Eyes: Negative for visual disturbance.  Respiratory: Negative for cough and chest tightness.   Gastrointestinal: Negative for abdominal pain and nausea.  Genitourinary: Negative for difficulty urinating, frequency and vaginal pain.  Musculoskeletal: Positive for arthralgias. Negative for back pain and gait problem.  Skin: Negative for pallor and rash.  Neurological: Negative for dizziness, tremors, weakness, numbness and headaches.  Psychiatric/Behavioral: Positive for behavioral problems and confusion. Negative for sleep disturbance and suicidal ideas. The patient is nervous/anxious.     Objective:  BP 116/62 (BP Location: Left Arm, Patient Position: Sitting, Cuff Size: Normal)   Pulse 66   Temp 98 F (36.7 C) (Oral)   Ht 5\' 6"  (1.676 m)   Wt 166 lb (75.3 kg)   SpO2 99%   BMI 26.79 kg/m   BP Readings from Last 3 Encounters:  05/21/18 116/62  05/11/18 129/83  02/19/18 116/68    Wt Readings from Last 3 Encounters:  05/21/18 166 lb (75.3 kg)  02/19/18 171 lb (77.6 kg)  11/22/17 177 lb 9.6 oz (80.6 kg)    Physical  Exam  Constitutional: She appears well-developed. No distress.  HENT:  Head: Normocephalic.  Right Ear: External ear normal.  Left Ear: External ear normal.  Nose: Nose normal.  Mouth/Throat: Oropharynx is clear and moist.  Eyes: Pupils are equal, round, and reactive to light. Conjunctivae are normal. Right eye exhibits no discharge. Left eye exhibits no discharge.  Neck: Normal range of motion. Neck supple. No JVD present. No tracheal deviation present. No thyromegaly present.  Cardiovascular: Normal rate, regular rhythm and normal heart sounds.  Pulmonary/Chest: No stridor. No respiratory distress. She has no wheezes.  Abdominal: Soft. Bowel  sounds are normal. She exhibits no distension and no mass. There is no tenderness. There is no rebound and no guarding.  Musculoskeletal: She exhibits no edema or tenderness.  Lymphadenopathy:    She has no cervical adenopathy.  Neurological: She displays normal reflexes. No cranial nerve deficit. She exhibits normal muscle tone. Coordination normal.  Skin: No rash noted. No erythema.  Psychiatric: She has a normal mood and affect. Her behavior is normal. Judgment and thought content normal.  a/c  Lab Results  Component Value Date   WBC 5.3 07/31/2017   HGB 13.0 07/31/2017   HCT 40.0 07/31/2017   PLT 295.0 07/31/2017   GLUCOSE 101 (H) 02/19/2018   CHOL 129 10/27/2014   TRIG 82 10/27/2014   HDL 45 10/27/2014   LDLCALC 68 10/27/2014   ALT 8 05/17/2016   AST 14 05/17/2016   NA 144 02/19/2018   K 4.5 02/19/2018   CL 105 02/19/2018   CREATININE 1.15 02/19/2018   BUN 25 (H) 02/19/2018   CO2 30 02/19/2018   TSH 4.305 10/28/2014   INR 1.12 02/05/2015   HGBA1C 6.1 02/19/2018   MICROALBUR <0.7 05/17/2016    Dg Abd Acute W/chest  Result Date: 05/11/2018 CLINICAL DATA:  77 year old female with constipation. Last bowel movement 2 weeks ago. EXAM: DG ABDOMEN ACUTE W/ 1V CHEST COMPARISON:  None. FINDINGS: Cardiac and mediastinal contours are within normal limits. The lungs are clear. No pleural effusion or pneumothorax. In implantable loop recorder projects over the left cardiac shadow. No evidence of free air on the upright radiograph. Surgical clips in a right upper quadrant suggest prior cholecystectomy. The bowel gas pattern is not obstructed. There is a moderate volume of stool overlying the rectum and in the left colon consistent with the clinical history of constipation. No organomegaly or abnormal calcifications. No acute osseous abnormality. Advanced degenerative disc disease and facet arthropathy in the lower lumbar spine. IMPRESSION: 1. No acute cardiopulmonary disease. 2.  Nonobstructed bowel gas pattern. 3. Moderate colonic stool burden. 4. Lower lumbar degenerative disc disease and facet arthropathy. Electronically Signed   By: Malachy Moan M.D.   On: 05/11/2018 14:06    Assessment & Plan:   There are no diagnoses linked to this encounter.   No orders of the defined types were placed in this encounter.    Follow-up: No follow-ups on file.  Sonda Primes, MD

## 2018-05-21 NOTE — Assessment & Plan Note (Signed)
Worse - will add Abilify

## 2018-05-21 NOTE — Assessment & Plan Note (Signed)
Add abilify

## 2018-06-04 LAB — CUP PACEART REMOTE DEVICE CHECK
Implantable Pulse Generator Implant Date: 20160921
MDC IDC SESS DTM: 20190826124159

## 2018-06-05 ENCOUNTER — Ambulatory Visit (INDEPENDENT_AMBULATORY_CARE_PROVIDER_SITE_OTHER): Payer: Medicare HMO | Admitting: Internal Medicine

## 2018-06-05 ENCOUNTER — Encounter: Payer: Self-pay | Admitting: Internal Medicine

## 2018-06-05 ENCOUNTER — Other Ambulatory Visit (INDEPENDENT_AMBULATORY_CARE_PROVIDER_SITE_OTHER): Payer: Medicare HMO

## 2018-06-05 ENCOUNTER — Ambulatory Visit: Payer: Self-pay

## 2018-06-05 VITALS — BP 100/60 | HR 84 | Temp 98.2°F | Ht 66.0 in | Wt 165.0 lb

## 2018-06-05 DIAGNOSIS — D509 Iron deficiency anemia, unspecified: Secondary | ICD-10-CM

## 2018-06-05 DIAGNOSIS — K921 Melena: Secondary | ICD-10-CM | POA: Diagnosis not present

## 2018-06-05 DIAGNOSIS — I10 Essential (primary) hypertension: Secondary | ICD-10-CM | POA: Diagnosis not present

## 2018-06-05 LAB — CBC WITH DIFFERENTIAL/PLATELET
Basophils Absolute: 0.1 10*3/uL (ref 0.0–0.1)
Basophils Relative: 1.1 % (ref 0.0–3.0)
EOS PCT: 2.9 % (ref 0.0–5.0)
Eosinophils Absolute: 0.1 10*3/uL (ref 0.0–0.7)
HCT: 37.2 % (ref 36.0–46.0)
Hemoglobin: 12.5 g/dL (ref 12.0–15.0)
LYMPHS ABS: 2.3 10*3/uL (ref 0.7–4.0)
Lymphocytes Relative: 46.7 % — ABNORMAL HIGH (ref 12.0–46.0)
MCHC: 33.7 g/dL (ref 30.0–36.0)
MCV: 91.5 fl (ref 78.0–100.0)
MONO ABS: 0.3 10*3/uL (ref 0.1–1.0)
Monocytes Relative: 6.7 % (ref 3.0–12.0)
NEUTROS ABS: 2.1 10*3/uL (ref 1.4–7.7)
NEUTROS PCT: 42.6 % — AB (ref 43.0–77.0)
PLATELETS: 296 10*3/uL (ref 150.0–400.0)
RBC: 4.07 Mil/uL (ref 3.87–5.11)
RDW: 15.1 % (ref 11.5–15.5)
WBC: 4.9 10*3/uL (ref 4.0–10.5)

## 2018-06-05 MED ORDER — HYDROCORTISONE ACETATE 25 MG RE SUPP: 25.0000 mg | Freq: Two times a day (BID) | RECTAL | 1 refills | Status: AC

## 2018-06-05 NOTE — Patient Instructions (Signed)
Please take all new medication as prescribed - the suppositories for the hemorrhoids bleeding  Please continue all other medications as before, and refills have been done if requested.  Please have the pharmacy call with any other refills you may need.  Please keep your appointments with your specialists as you may have planned  Please go to the LAB in the Basement (turn left off the elevator) for the tests to be done today  You will be contacted by phone if any changes need to be made immediately.  Otherwise, you will receive a letter about your results with an explanation, but please check with MyChart first.  Please remember to sign up for MyChart if you have not done so, as this will be important to you in the future with finding out test results, communicating by private email, and scheduling acute appointments online when needed.

## 2018-06-05 NOTE — Assessment & Plan Note (Signed)
Cbc stable,  to f/u any worsening symptoms or concerns

## 2018-06-05 NOTE — Assessment & Plan Note (Signed)
Suspect anal fissure vs hemorrhoid, though also has noted diverticulosis on last colonoscopy, Hgb stable, declines f/u GI or colonoscopy, for anusol HC supp bid

## 2018-06-05 NOTE — Assessment & Plan Note (Signed)
stable overall by history and exam, recent data reviewed with pt, and pt to continue medical treatment as before,  to f/u any worsening symptoms or concerns  

## 2018-06-05 NOTE — Telephone Encounter (Signed)
Incoming call from Patient who complains of rectal bleeding.  States it comes and goes.  ( intermittent) states most of the time its dark red.  Refer to the color as "liver blood color"  Occurs more during the day.  Does occur  During the night also.  Onset was around two weeks ago.  States that diarrhea is the consistency of the stools. States this has occurred 3 to 4 times.  Patient states that she does take Aspirin occasionally.  Abdomin tends to be crampy at times, was dizzy. Dizziness has stopped.  Denies fever.  Provided care advice.  Patient voiced understanding.  Schedule appointment for this afternoon 06/05/18 @4 :20pm with Dr. Oliver Barre.  No availability with Dr. Posey Rea   Patient voiced understanding.   Reason for Disposition . MILD rectal bleeding (more than just a few drops or streaks)  Answer Assessment - Initial Assessment Questions 1. APPEARANCE of BLOOD: "What color is it?" "Is it passed separately, on the surface of the stool, or mixed in with the stool?"      Sometime dark sometime red mostly when I have a bowel movement 2. AMOUNT: "How much blood was passed?"      Just comes and goes 3. FREQUENCY: "How many times has blood been passed with the stools?"      More during day time, happened at night  also 4. ONSET: "When was the blood first seen in the stools?" (Days or weeks)      Onset two weeks ago 5. DIARRHEA: "Is there also some diarrhea?" If so, ask: "How many diarrhea stools were passed in past 24 hours?"      diarrhea 6. CONSTIPATION: "Do you have constipation?" If so, "How bad is it?"     no 7. RECURRENT SYMPTOMS: "Have you had blood in your stools before?" If so, ask: "When was the last time?" and "What happened that time?"      To or three times  8. BLOOD THINNERS: "Do you take any blood thinners?" (e.g., Coumadin/warfarin, Pradaxa/dabigatran, aspirin)     aspirin 9. OTHER SYMPTOMS: "Do you have any other symptoms?"  (e.g., abdominal pain, vomiting, dizziness,  fever)    Cramping was dizzy has stopped, no fever 10. PREGNANCY: "Is there any chance you are pregnant?" "When was your last menstrual period?"       na  Protocols used: RECTAL BLEEDING-A-AH

## 2018-06-05 NOTE — Progress Notes (Signed)
Subjective:    Patient ID: Tracy [name], female    DOB: [birth year], 77 y.o.   MRN: [id number]  HPI  Here with son with c/o 2 wks daily small volume BRBPR, initially started with a painful rectal episode, then none now for over 1 wk; Pt denies chest pain, increased sob or doe, wheezing, orthopnea, PND, increased LE swelling, palpitations, dizziness or syncope.  Has hx of similar 2017 felt due to internal hemorrhoid after colonoscopy.  Pt denies new neurological symptoms such as new headache, or facial or extremity weakness or numbness   Pt denies polydipsia, polyuria Past Medical History:  Diagnosis Date  . Acute cholecystitis 08/10/2013   Lap chole on 08/13/13   . Anxiety   . Depression   . Diabetes mellitus without complication ([name])   . Hypertension   . Restless leg syndrome   . Stroke [facility])    Past Surgical History:  Procedure Laterality Date  . CHOLECYSTECTOMY N/A 08/13/2013   Procedure: LAPAROSCOPIC CHOLECYSTECTOMY ;  Surgeon: Currie Paris, MD;  Location: [facility] OR;  Service: General;  Laterality: N/A;  . EP IMPLANTABLE DEVICE N/A 06/02/2015   Procedure: Loop Recorder Insertion;  Surgeon: Duke Salvia, MD;  Location: [facility] INVASIVE CV LAB;  Service: Cardiovascular;  Laterality: N/A;  . ERCP N/A 08/12/2013   Procedure: ENDOSCOPIC RETROGRADE CHOLANGIOPANCREATOGRAPHY (ERCP);  Surgeon: Theda Belfast, MD;  Location: [facility] ENDOSCOPY;  Service: Endoscopy;  Laterality: N/A;  . INCISIONAL HERNIA REPAIR N/A 08/20/2013   Procedure: HERNIA REPAIR INCISIONAL UMBILICAL;  Surgeon: Velora Heckler, MD;  Location: [facility] OR;  Service: General;  Laterality: N/A;  . SPHINCTEROTOMY  08/12/2013   Procedure: Dennison Mascot;  Surgeon: Theda Belfast, MD;  Location: [facility] ENDOSCOPY;  Service: Endoscopy;;    reports that she has never smoked. She has never used smokeless tobacco. She reports that she does not drink alcohol or use drugs. family history includes Stroke in her mother. Allergies  Allergen Reactions    . Wellbutrin [Bupropion] Other (See Comments)    Wt loss   Current Outpatient Medications on File Prior to Visit  Medication Sig Dispense Refill  . acetaminophen (TYLENOL) 325 MG tablet Take 650 mg by mouth every 6 (six) hours as needed for pain.    . ARIPiprazole (ABILIFY) 2 MG tablet Take 1 tablet (2 mg total) by mouth daily. 30 tablet 5  . aspirin EC 81 MG tablet Take 81 mg by mouth daily.    . bisacodyl (DULCOLAX) 5 MG EC tablet Take 1-2 tablets (5-10 mg total) by mouth daily as needed for moderate constipation. 60 tablet 5  . cetaphil (CETAPHIL) lotion Apply 1 application topically 2 (two) times daily.    . cholecalciferol (VITAMIN D) 1000 UNITS tablet Take 1,000 Units by mouth daily.    . [facility]S VITAMIN B12 1000 MCG tablet TAKE 1 TABLET EVERY DAY 90 tablet 3  . donepezil (ARICEPT) 10 MG tablet TAKE 1 TABLET (10 MG TOTAL) BY MOUTH AT BEDTIME. 90 tablet 3  . escitalopram (LEXAPRO) 10 MG tablet TAKE 1 TABLET (10 MG TOTAL) BY MOUTH DAILY. 90 tablet 3  . gabapentin (NEURONTIN) 100 MG capsule TAKE 2 CAPSULES (200 MG TOTAL) BY MOUTH AT BEDTIME. 180 capsule 1  . glucose blood test strip One touch Verio Flex.  Use as instructed to test sugars daily and prn.  E11.59 100 each 12  . Ibuprofen-Famotidine 800-26.6 MG TABS Take 1 tablet by mouth 2 (two) times daily as needed (for pain).    Marland Kitchen  lubiprostone (AMITIZA) 24 MCG capsule Take 24 mcg by mouth 2 (two) times daily as needed for constipation.    . meclizine (ANTIVERT) 12.5 MG tablet Take 12.5 mg by mouth as needed for dizziness.    . metFORMIN (GLUCOPHAGE) 500 MG tablet TAKE 1 TABLET  DAILY WITH BREAKFAST. 90 tablet 3  . polyethylene glycol powder (GLYCOLAX/MIRALAX) powder Take 1 capful in 10 oz of fluid 2 times daily until you are having daily soft stools 250 g 0  . ranitidine (ZANTAC) 150 MG tablet Take 1 tablet (150 mg total) by mouth 2 (two) times daily. 60 tablet 11  . spironolactone (ALDACTONE) 25 MG tablet TAKE 1 TABLET EVERY DAY 90 tablet 1    No current facility-administered medications on file prior to visit.    Review of Systems  Constitutional: Negative for other unusual diaphoresis or sweats HENT: Negative for ear discharge or swelling Eyes: Negative for other worsening visual disturbances Respiratory: Negative for stridor or other swelling  Gastrointestinal: Negative for worsening distension or other blood Genitourinary: Negative for retention or other urinary change Musculoskeletal: Negative for other MSK pain or swelling Skin: Negative for color change or other new lesions Neurological: Negative for worsening tremors and other numbness  Psychiatric/Behavioral: Negative for worsening agitation or other fatigue All other system neg per pt    Objective:   Physical Exam BP 100/60 (BP Location: Left Arm, Patient Position: Sitting, Cuff Size: Normal)   Pulse 84   Temp 98.2 F (36.8 C) (Oral)   Ht 5\' 6"  (1.676 m)   Wt 165 lb (74.8 kg)   SpO2 96%   BMI 26.63 kg/m  VS noted,  Constitutional: Pt appears in NAD HENT: Head: NCAT.  Right Ear: External ear normal.  Left Ear: External ear normal.  Eyes: . Pupils are equal, round, and reactive to light. Conjunctivae and EOM are normal Nose: without d/c or deformity Neck: Neck supple. Gross normal ROM Cardiovascular: Normal rate and regular rhythm.   Pulmonary/Chest: Effort normal and breath sounds without rales or wheezing.  Abd:  Soft, NT, ND, + BS, no organomegaly Neurological: Pt is alert. At baseline orientation, motor grossly intact Skin: Skin is warm. No rashes, other new lesions, no LE edema Psychiatric: Pt behavior is normal without agitation  No other exam findings Lab Results  Component Value Date   WBC 4.9 06/05/2018   HGB 12.5 06/05/2018   HCT 37.2 06/05/2018   PLT 296.0 06/05/2018   GLUCOSE 100 (H) 05/21/2018   CHOL 129 10/27/2014   TRIG 82 10/27/2014   HDL 45 10/27/2014   LDLCALC 68 10/27/2014   ALT 8 05/17/2016   AST 14 05/17/2016   NA 143  05/21/2018   K 4.2 05/21/2018   CL 106 05/21/2018   CREATININE 1.10 05/21/2018   BUN 18 05/21/2018   CO2 28 05/21/2018   TSH 4.305 10/28/2014   INR 1.12 02/05/2015   HGBA1C 5.9 05/21/2018   MICROALBUR <0.7 05/17/2016       Assessment & Plan:

## 2018-06-10 ENCOUNTER — Ambulatory Visit (INDEPENDENT_AMBULATORY_CARE_PROVIDER_SITE_OTHER): Payer: Medicare HMO | Admitting: *Deleted

## 2018-06-10 ENCOUNTER — Other Ambulatory Visit: Payer: Self-pay | Admitting: Internal Medicine

## 2018-06-10 DIAGNOSIS — I639 Cerebral infarction, unspecified: Secondary | ICD-10-CM

## 2018-06-10 NOTE — Progress Notes (Signed)
Carelink Summary Report / Loop Recorder 

## 2018-06-11 LAB — CUP PACEART REMOTE DEVICE CHECK
Implantable Pulse Generator Implant Date: 20160921
MDC IDC SESS DTM: 20190928133602

## 2018-07-11 ENCOUNTER — Ambulatory Visit (INDEPENDENT_AMBULATORY_CARE_PROVIDER_SITE_OTHER): Payer: Medicare HMO | Admitting: *Deleted

## 2018-07-11 DIAGNOSIS — I639 Cerebral infarction, unspecified: Secondary | ICD-10-CM | POA: Diagnosis not present

## 2018-07-12 NOTE — Progress Notes (Signed)
Carelink Summary Report / Loop Recorder 

## 2018-07-22 ENCOUNTER — Telehealth: Payer: Self-pay | Admitting: Internal Medicine

## 2018-07-22 NOTE — Telephone Encounter (Unsigned)
Copied from CRM [id number]. Topic: Quick Communication - See Telephone Encounter >> Jul 22, 2018  8:45 AM Aretta Nip wrote: CRM for notification. See Telephone encounter for: 07/22/18. Needed to see if there is a cheaper alternative to Aripiprazole 2 mg for her nerves. She cannot afford to get last script filled on 9/10 because of expense. Please call son Megin Consalvo [facility] [place]- Fax

## 2018-08-02 LAB — CUP PACEART REMOTE DEVICE CHECK
Implantable Pulse Generator Implant Date: 20160921
MDC IDC SESS DTM: 20191031144007

## 2018-08-12 ENCOUNTER — Encounter: Payer: Self-pay | Admitting: Internal Medicine

## 2018-08-12 ENCOUNTER — Ambulatory Visit (INDEPENDENT_AMBULATORY_CARE_PROVIDER_SITE_OTHER): Payer: Medicare HMO | Admitting: Internal Medicine

## 2018-08-12 DIAGNOSIS — F5105 Insomnia due to other mental disorder: Secondary | ICD-10-CM | POA: Diagnosis not present

## 2018-08-12 DIAGNOSIS — F0391 Unspecified dementia with behavioral disturbance: Secondary | ICD-10-CM | POA: Diagnosis not present

## 2018-08-12 DIAGNOSIS — E669 Obesity, unspecified: Secondary | ICD-10-CM | POA: Diagnosis not present

## 2018-08-12 DIAGNOSIS — E1169 Type 2 diabetes mellitus with other specified complication: Secondary | ICD-10-CM | POA: Diagnosis not present

## 2018-08-12 DIAGNOSIS — I6310 Cerebral infarction due to embolism of unspecified precerebral artery: Secondary | ICD-10-CM

## 2018-08-12 DIAGNOSIS — F99 Mental disorder, not otherwise specified: Secondary | ICD-10-CM | POA: Diagnosis not present

## 2018-08-12 DIAGNOSIS — F03918 Unspecified dementia, unspecified severity, with other behavioral disturbance: Secondary | ICD-10-CM

## 2018-08-12 MED ORDER — QUETIAPINE FUMARATE 25 MG PO TABS: 25.0000 mg | ORAL_TABLET | Freq: Two times a day (BID) | ORAL | 5 refills | Status: DC

## 2018-08-12 NOTE — Assessment & Plan Note (Signed)
ASA No relapse 

## 2018-08-12 NOTE — Assessment & Plan Note (Signed)
Metformin 

## 2018-08-12 NOTE — Assessment & Plan Note (Addendum)
   Quetiapine trial Potential benefits of a long term antipsychotic use as well as potential risks  and complications were explained to the patient's family (including increased risk of death) and were aknowledged.

## 2018-08-12 NOTE — Progress Notes (Signed)
Subjective:  Patient ID: Tracy [name], female    DOB: 02/28/1941  Age: 77 y.o. MRN: [id number]  CC: No chief complaint on file.   HPI Tracy [name] presents for anger, aggression and confusion rampages. C/o wondering the streets. Much worse over past 2 mo. Comes w/son and dtr. She has supervision at home most of the time  Abilify was too $$$ - not taking  Outpatient Medications Prior to Visit  Medication Sig Dispense Refill  . acetaminophen (TYLENOL) 325 MG tablet Take 650 mg by mouth every 6 (six) hours as needed for pain.    . ARIPiprazole (ABILIFY) 2 MG tablet Take 1 tablet (2 mg total) by mouth daily. 30 tablet 5  . aspirin EC 81 MG tablet Take 81 mg by mouth daily.    . bisacodyl (DULCOLAX) 5 MG EC tablet Take 1-2 tablets (5-10 mg total) by mouth daily as needed for moderate constipation. 60 tablet 5  . cetaphil (CETAPHIL) lotion Apply 1 application topically 2 (two) times daily.    . cholecalciferol (VITAMIN D) 1000 UNITS tablet Take 1,000 Units by mouth daily.    . [facility]S VITAMIN B12 1000 MCG tablet TAKE 1 TABLET EVERY DAY 90 tablet 3  . donepezil (ARICEPT) 10 MG tablet TAKE 1 TABLET (10 MG TOTAL) BY MOUTH AT BEDTIME. 90 tablet 3  . escitalopram (LEXAPRO) 10 MG tablet TAKE 1 TABLET (10 MG TOTAL) BY MOUTH DAILY. 90 tablet 3  . esomeprazole (NEXIUM) 40 MG capsule TAKE 1 CAPSULE BY MOUTH EVERY DAY 90 capsule 3  . gabapentin (NEURONTIN) 100 MG capsule TAKE 2 CAPSULES (200 MG TOTAL) BY MOUTH AT BEDTIME. 180 capsule 1  . glucose blood test strip One touch Verio Flex.  Use as instructed to test sugars daily and prn.  E11.59 100 each 12  . hydrocortisone (ANUSOL-HC) 25 MG suppository Place 1 suppository (25 mg total) rectally every 12 (twelve) hours. 12 suppository 1  . Ibuprofen-Famotidine 800-26.6 MG TABS Take 1 tablet by mouth 2 (two) times daily as needed (for pain).    . lubiprostone (AMITIZA) 24 MCG capsule Take 24 mcg by mouth 2 (two) times daily as needed for constipation.      . meclizine (ANTIVERT) 12.5 MG tablet Take 12.5 mg by mouth as needed for dizziness.    . metFORMIN (GLUCOPHAGE) 500 MG tablet TAKE 1 TABLET  DAILY WITH BREAKFAST. 90 tablet 3  . polyethylene glycol powder (GLYCOLAX/MIRALAX) powder Take 1 capful in 10 oz of fluid 2 times daily until you are having daily soft stools 250 g 0  . ranitidine (ZANTAC) 150 MG tablet Take 1 tablet (150 mg total) by mouth 2 (two) times daily. 60 tablet 11  . spironolactone (ALDACTONE) 25 MG tablet TAKE 1 TABLET EVERY DAY 90 tablet 1   No facility-administered medications prior to visit.     ROS: Review of Systems  Constitutional: Negative for activity change, appetite change, chills, fatigue and unexpected weight change.  HENT: Negative for congestion, mouth sores and sinus pressure.   Eyes: Negative for visual disturbance.  Respiratory: Negative for cough and chest tightness.   Gastrointestinal: Negative for abdominal pain and nausea.  Genitourinary: Negative for difficulty urinating, frequency and vaginal pain.  Musculoskeletal: Negative for back pain and gait problem.  Skin: Negative for pallor and rash.  Neurological: Negative for dizziness, tremors, weakness, numbness and headaches.  Psychiatric/Behavioral: Positive for agitation, behavioral problems, confusion, decreased concentration, dysphoric mood, hallucinations and sleep disturbance. Negative for suicidal ideas. The patient is  nervous/anxious.     Objective:  BP 126/78 (BP Location: Left Arm, Patient Position: Sitting, Cuff Size: Normal)   Pulse 76   Temp 98 F (36.7 C) (Oral)   Ht 5\' 6"  (1.676 m)   Wt 155 lb (70.3 kg)   BMI 25.02 kg/m   BP Readings from Last 3 Encounters:  08/12/18 126/78  06/05/18 100/60  05/21/18 116/62    Wt Readings from Last 3 Encounters:  08/12/18 155 lb (70.3 kg)  06/05/18 165 lb (74.8 kg)  05/21/18 166 lb (75.3 kg)    Physical Exam  Constitutional: She appears well-developed. No distress.  HENT:  Head:  Normocephalic.  Right Ear: External ear normal.  Left Ear: External ear normal.  Nose: Nose normal.  Mouth/Throat: Oropharynx is clear and moist.  Eyes: Pupils are equal, round, and reactive to light. Conjunctivae are normal. Right eye exhibits no discharge. Left eye exhibits no discharge.  Neck: Normal range of motion. Neck supple. No JVD present. No tracheal deviation present. No thyromegaly present.  Cardiovascular: Normal rate, regular rhythm and normal heart sounds.  Pulmonary/Chest: No stridor. No respiratory distress. She has no wheezes.  Abdominal: Soft. Bowel sounds are normal. She exhibits no distension and no mass. There is no tenderness. There is no rebound and no guarding.  Musculoskeletal: She exhibits no edema or tenderness.  Lymphadenopathy:    She has no cervical adenopathy.  Neurological: She displays normal reflexes. No cranial nerve deficit. She exhibits normal muscle tone. Coordination abnormal.  Skin: No rash noted. No erythema.  cane Alert, disoriented  Lab Results  Component Value Date   WBC 4.9 06/05/2018   HGB 12.5 06/05/2018   HCT 37.2 06/05/2018   PLT 296.0 06/05/2018   GLUCOSE 100 (H) 05/21/2018   CHOL 129 10/27/2014   TRIG 82 10/27/2014   HDL 45 10/27/2014   LDLCALC 68 10/27/2014   ALT 8 05/17/2016   AST 14 05/17/2016   NA 143 05/21/2018   K 4.2 05/21/2018   CL 106 05/21/2018   CREATININE 1.10 05/21/2018   BUN 18 05/21/2018   CO2 28 05/21/2018   TSH 4.305 10/28/2014   INR 1.12 02/05/2015   HGBA1C 5.9 05/21/2018   MICROALBUR <0.7 05/17/2016    Dg Abd Acute W/chest  Result Date: 05/11/2018 CLINICAL DATA:  77 year old female with constipation. Last bowel movement 2 weeks ago. EXAM: DG ABDOMEN ACUTE W/ 1V CHEST COMPARISON:  None. FINDINGS: Cardiac and mediastinal contours are within normal limits. The lungs are clear. No pleural effusion or pneumothorax. In implantable loop recorder projects over the left cardiac shadow. No evidence of free air  on the upright radiograph. Surgical clips in a right upper quadrant suggest prior cholecystectomy. The bowel gas pattern is not obstructed. There is a moderate volume of stool overlying the rectum and in the left colon consistent with the clinical history of constipation. No organomegaly or abnormal calcifications. No acute osseous abnormality. Advanced degenerative disc disease and facet arthropathy in the lower lumbar spine. IMPRESSION: 1. No acute cardiopulmonary disease. 2. Nonobstructed bowel gas pattern. 3. Moderate colonic stool burden. 4. Lower lumbar degenerative disc disease and facet arthropathy. Electronically Signed   By: Malachy MoanHeath  McCullough M.D.   On: 05/11/2018 14:06    Assessment & Plan:   There are no diagnoses linked to this encounter.   No orders of the defined types were placed in this encounter.    Follow-up: No follow-ups on file.  Sonda PrimesAlex Kyler Lerette, MD

## 2018-08-12 NOTE — Assessment & Plan Note (Addendum)
Worse. Quetiapine trial Potential benefits of a long term antipsychotic use as well as potential risks  and complications were explained to the patient's family (including increased risk of death) and were aknowledged. Placement discussed

## 2018-08-13 ENCOUNTER — Ambulatory Visit (INDEPENDENT_AMBULATORY_CARE_PROVIDER_SITE_OTHER): Payer: Medicare HMO

## 2018-08-13 DIAGNOSIS — I639 Cerebral infarction, unspecified: Secondary | ICD-10-CM

## 2018-08-14 NOTE — Progress Notes (Signed)
Carelink Summary Report / Loop Recorder 

## 2018-08-29 ENCOUNTER — Telehealth: Payer: Self-pay

## 2018-08-29 NOTE — Telephone Encounter (Signed)
I spoke with the patient about her battery reaching RRT. She said she wants to just leave it in. I will send her a return kit and un-enroll her in [facility].

## 2018-08-30 NOTE — Telephone Encounter (Signed)
LINQ at RRT as of 08/26/18. Routed to Dr. Graciela HusbandsKlein as Lorain ChildesFYI.

## 2018-08-30 NOTE — Telephone Encounter (Signed)
Can cancel  We can see prn

## 2018-09-02 NOTE — Telephone Encounter (Signed)
Called and spoke with patient, she is aware that per Dr. Graciela HusbandsKlein she can be seen as needed unless she wants LINQ explanted. Patient verbalized understanding and had no additional questions.

## 2018-09-03 ENCOUNTER — Ambulatory Visit (INDEPENDENT_AMBULATORY_CARE_PROVIDER_SITE_OTHER): Payer: Medicare HMO | Admitting: Internal Medicine

## 2018-09-03 ENCOUNTER — Other Ambulatory Visit (INDEPENDENT_AMBULATORY_CARE_PROVIDER_SITE_OTHER): Payer: Medicare HMO

## 2018-09-03 ENCOUNTER — Encounter: Payer: Self-pay | Admitting: Internal Medicine

## 2018-09-03 DIAGNOSIS — E669 Obesity, unspecified: Secondary | ICD-10-CM

## 2018-09-03 DIAGNOSIS — I10 Essential (primary) hypertension: Secondary | ICD-10-CM | POA: Diagnosis not present

## 2018-09-03 DIAGNOSIS — F03918 Unspecified dementia, unspecified severity, with other behavioral disturbance: Secondary | ICD-10-CM

## 2018-09-03 DIAGNOSIS — Z8673 Personal history of transient ischemic attack (TIA), and cerebral infarction without residual deficits: Secondary | ICD-10-CM

## 2018-09-03 DIAGNOSIS — F0391 Unspecified dementia with behavioral disturbance: Secondary | ICD-10-CM

## 2018-09-03 DIAGNOSIS — E1159 Type 2 diabetes mellitus with other circulatory complications: Secondary | ICD-10-CM

## 2018-09-03 DIAGNOSIS — E1169 Type 2 diabetes mellitus with other specified complication: Secondary | ICD-10-CM | POA: Diagnosis not present

## 2018-09-03 LAB — BASIC METABOLIC PANEL
BUN: 17 mg/dL (ref 6–23)
CHLORIDE: 105 meq/L (ref 96–112)
CO2: 30 mEq/L (ref 19–32)
Calcium: 9.4 mg/dL (ref 8.4–10.5)
Creatinine, Ser: 1.06 mg/dL (ref 0.40–1.20)
GFR: 64.62 mL/min (ref >60.00)
Glucose, Bld: 110 mg/dL — ABNORMAL HIGH (ref 70–99)
POTASSIUM: 4.6 meq/L (ref 3.5–5.1)
SODIUM: 144 meq/L (ref 135–145)

## 2018-09-03 LAB — HEMOGLOBIN A1C: HEMOGLOBIN A1C: 5.9 % (ref 4.6–6.5)

## 2018-09-03 MED ORDER — QUETIAPINE FUMARATE 50 MG PO TABS: 50.0000 mg | ORAL_TABLET | Freq: Two times a day (BID) | ORAL | 5 refills | Status: DC

## 2018-09-03 NOTE — Progress Notes (Signed)
Subjective:  Patient ID: Tracy [name], female    DOB: 02/24/1941  Age: 77 y.o. MRN: [id number]  CC: No chief complaint on file.   HPI Tracy [name] presents for dementia, HTN, mood disorder/anger - better on Rx, still having problems however  Outpatient Medications Prior to Visit  Medication Sig Dispense Refill  . acetaminophen (TYLENOL) 325 MG tablet Take 650 mg by mouth every 6 (six) hours as needed for pain.    Marland Kitchen. aspirin EC 81 MG tablet Take 81 mg by mouth daily.    . bisacodyl (DULCOLAX) 5 MG EC tablet Take 1-2 tablets (5-10 mg total) by mouth daily as needed for moderate constipation. 60 tablet 5  . cetaphil (CETAPHIL) lotion Apply 1 application topically 2 (two) times daily.    . cholecalciferol (VITAMIN D) 1000 UNITS tablet Take 1,000 Units by mouth daily.    . [facility] VITAMIN B12 1000 MCG tablet TAKE 1 TABLET EVERY DAY 90 tablet 3  . donepezil (ARICEPT) 10 MG tablet TAKE 1 TABLET (10 MG TOTAL) BY MOUTH AT BEDTIME. 90 tablet 3  . escitalopram (LEXAPRO) 10 MG tablet TAKE 1 TABLET (10 MG TOTAL) BY MOUTH DAILY. 90 tablet 3  . esomeprazole (NEXIUM) 40 MG capsule TAKE 1 CAPSULE BY MOUTH EVERY DAY 90 capsule 3  . gabapentin (NEURONTIN) 100 MG capsule TAKE 2 CAPSULES (200 MG TOTAL) BY MOUTH AT BEDTIME. 180 capsule 1  . glucose blood test strip One touch Verio Flex.  Use as instructed to test sugars daily and prn.  E11.59 100 each 12  . hydrocortisone (ANUSOL-HC) 25 MG suppository Place 1 suppository (25 mg total) rectally every 12 (twelve) hours. 12 suppository 1  . Ibuprofen-Famotidine 800-26.6 MG TABS Take 1 tablet by mouth 2 (two) times daily as needed (for pain).    . lubiprostone (AMITIZA) 24 MCG capsule Take 24 mcg by mouth 2 (two) times daily as needed for constipation.    . meclizine (ANTIVERT) 12.5 MG tablet Take 12.5 mg by mouth as needed for dizziness.    . metFORMIN (GLUCOPHAGE) 500 MG tablet TAKE 1 TABLET  DAILY WITH BREAKFAST. 90 tablet 3  . polyethylene glycol powder  (GLYCOLAX/MIRALAX) powder Take 1 capful in 10 oz of fluid 2 times daily until you are having daily soft stools 250 g 0  . QUEtiapine (SEROQUEL) 25 MG tablet Take 1 tablet (25 mg total) by mouth 2 (two) times daily. 60 tablet 5  . ranitidine (ZANTAC) 150 MG tablet Take 1 tablet (150 mg total) by mouth 2 (two) times daily. 60 tablet 11  . spironolactone (ALDACTONE) 25 MG tablet TAKE 1 TABLET EVERY DAY 90 tablet 1   No facility-administered medications prior to visit.     ROS: Review of Systems  Constitutional: Negative.  Negative for activity change, appetite change, chills, diaphoresis, fatigue, fever and unexpected weight change.  HENT: Negative for congestion, ear pain, facial swelling, hearing loss, mouth sores, nosebleeds, postnasal drip, rhinorrhea, sinus pressure, sneezing, sore throat, tinnitus and trouble swallowing.   Eyes: Negative for pain, discharge, redness, itching and visual disturbance.  Respiratory: Negative for cough, chest tightness, shortness of breath, wheezing and stridor.   Cardiovascular: Negative for chest pain, palpitations and leg swelling.  Gastrointestinal: Negative for abdominal distention, anal bleeding, blood in stool, constipation, diarrhea, nausea and rectal pain.  Genitourinary: Negative for difficulty urinating, dysuria, flank pain, frequency, genital sores, hematuria, pelvic pain, urgency, vaginal bleeding and vaginal discharge.  Musculoskeletal: Negative for arthralgias, back pain, gait problem, joint swelling,  neck pain and neck stiffness.  Skin: Negative.  Negative for rash.  Neurological: Negative for dizziness, tremors, seizures, syncope, speech difficulty, weakness, numbness and headaches.  Hematological: Negative for adenopathy. Does not bruise/bleed easily.  Psychiatric/Behavioral: Positive for agitation, behavioral problems, confusion, decreased concentration, dysphoric mood and sleep disturbance. Negative for suicidal ideas. The patient is  nervous/anxious.     Objective:  BP 126/82 (BP Location: Right Arm, Patient Position: Sitting, Cuff Size: Large)   Pulse 86   Temp 97.6 F (36.4 C) (Oral)   Ht 5\' 6"  (1.676 m)   Wt 157 lb (71.2 kg)   SpO2 97%   BMI 25.34 kg/m   BP Readings from Last 3 Encounters:  09/03/18 126/82  08/12/18 126/78  06/05/18 100/60    Wt Readings from Last 3 Encounters:  09/03/18 157 lb (71.2 kg)  08/12/18 155 lb (70.3 kg)  06/05/18 165 lb (74.8 kg)    Physical Exam Constitutional:      General: She is not in acute distress.    Appearance: She is well-developed.  HENT:     Head: Normocephalic.     Right Ear: External ear normal.     Left Ear: External ear normal.     Nose: Nose normal.  Eyes:     General:        Right eye: No discharge.        Left eye: No discharge.     Conjunctiva/sclera: Conjunctivae normal.     Pupils: Pupils are equal, round, and reactive to light.  Neck:     Musculoskeletal: Normal range of motion and neck supple.     Thyroid: No thyromegaly.     Vascular: No JVD.     Trachea: No tracheal deviation.  Cardiovascular:     Rate and Rhythm: Normal rate and regular rhythm.     Heart sounds: Normal heart sounds.  Pulmonary:     Effort: No respiratory distress.     Breath sounds: No stridor. No wheezing.  Abdominal:     General: Bowel sounds are normal. There is no distension.     Palpations: Abdomen is soft. There is no mass.     Tenderness: There is no abdominal tenderness. There is no guarding or rebound.  Musculoskeletal:        General: No tenderness.  Lymphadenopathy:     Cervical: No cervical adenopathy.  Skin:    Findings: No erythema or rash.  Neurological:     Mental Status: She is disoriented.     Cranial Nerves: No cranial nerve deficit.     Motor: No abnormal muscle tone.     Coordination: Coordination normal.     Deep Tendon Reflexes: Reflexes normal.  Psychiatric:        Mood and Affect: Mood normal.     Lab Results  Component  Value Date   WBC 4.9 06/05/2018   HGB 12.5 06/05/2018   HCT 37.2 06/05/2018   PLT 296.0 06/05/2018   GLUCOSE 100 (H) 05/21/2018   CHOL 129 10/27/2014   TRIG 82 10/27/2014   HDL 45 10/27/2014   LDLCALC 68 10/27/2014   ALT 8 05/17/2016   AST 14 05/17/2016   NA 143 05/21/2018   K 4.2 05/21/2018   CL 106 05/21/2018   CREATININE 1.10 05/21/2018   BUN 18 05/21/2018   CO2 28 05/21/2018   TSH 4.305 10/28/2014   INR 1.12 02/05/2015   HGBA1C 5.9 05/21/2018   MICROALBUR <0.7 05/17/2016    Dg Abd Acute  W/chest  Result Date: 05/11/2018 CLINICAL DATA:  77 year old female with constipation. Last bowel movement 2 weeks ago. EXAM: DG ABDOMEN ACUTE W/ 1V CHEST COMPARISON:  None. FINDINGS: Cardiac and mediastinal contours are within normal limits. The lungs are clear. No pleural effusion or pneumothorax. In implantable loop recorder projects over the left cardiac shadow. No evidence of free air on the upright radiograph. Surgical clips in a right upper quadrant suggest prior cholecystectomy. The bowel gas pattern is not obstructed. There is a moderate volume of stool overlying the rectum and in the left colon consistent with the clinical history of constipation. No organomegaly or abnormal calcifications. No acute osseous abnormality. Advanced degenerative disc disease and facet arthropathy in the lower lumbar spine. IMPRESSION: 1. No acute cardiopulmonary disease. 2. Nonobstructed bowel gas pattern. 3. Moderate colonic stool burden. 4. Lower lumbar degenerative disc disease and facet arthropathy. Electronically Signed   By: Malachy Moan M.D.   On: 05/11/2018 14:06    Assessment & Plan:   There are no diagnoses linked to this encounter.   No orders of the defined types were placed in this encounter.    Follow-up: No follow-ups on file.  Sonda Primes, MD

## 2018-09-03 NOTE — Assessment & Plan Note (Signed)
Spironolactone

## 2018-09-03 NOTE — Assessment & Plan Note (Signed)
No relapse R hemiparesis On Plavix, Pravachol

## 2018-09-03 NOTE — Assessment & Plan Note (Signed)
Metformin 

## 2018-09-03 NOTE — Assessment & Plan Note (Signed)
Better We will increase Seroquel dose - so far no side effects Cost $67 - gave Good Rx

## 2018-09-05 ENCOUNTER — Other Ambulatory Visit: Payer: Self-pay | Admitting: Internal Medicine

## 2018-09-11 LAB — CUP PACEART REMOTE DEVICE CHECK
Date Time Interrogation Session: 20191203151049
Implantable Pulse Generator Implant Date: 20160921

## 2018-09-20 ENCOUNTER — Other Ambulatory Visit: Payer: Self-pay | Admitting: Internal Medicine

## 2018-09-20 DIAGNOSIS — Z1231 Encounter for screening mammogram for malignant neoplasm of breast: Secondary | ICD-10-CM

## 2018-09-24 ENCOUNTER — Other Ambulatory Visit: Payer: Self-pay

## 2018-09-24 NOTE — Patient Outreach (Signed)
[name] Care Management Chronic Special Needs Program  09/24/2018  Name: Tracy [name] DOB: 06/10/1941  MRN: [id number]  Tracy [name] is enrolled in a chronic special needs plan for Diabetes. Chronic Care Management Coordinator telephoned client to review health risk assessment and to develop individualized care plan.  Introduced the chronic care management program, importance of client participation, and taking their care plan to all provider appointments and inpatient facilities.  Reviewed the transition of care process and possible referral to community care management. Client gave verbal permission to speak with daughter Tracy HickmanSylvia [name] [phone]  Subjective:Daughter Tracy ElmSylvia states that the biggest issue with her Mother is her dementia.  States she gets confused and agitated at times.  States that all 7 of the clients children help with their Mother and Father.  States that they each have a separate duty that they handle.  States she would like some information about Adult Daycare.  Goals Addressed            This Visit's Progress   .  Acknowledge receipt of Medical illustratorAdvanced Directive package      . Advanced Care Planning complete by  next 3 months       Social Worker from [facility] will call you to assist with Kelly Servicesdvanced Directives    . Client understands the importance of follow-up with providers by attending scheduled visits      . Client will not report change from baseline and no repeated symptoms of stroke with in the next 3 months       . Client will report no fall or injuries in the next 3 months.      . Client will use Assistive Devices as needed and verbalize understanding of device use      . Client will verbalize knowledge of self management of Hypertension as evidences by BP reading of 140/90 or less; or as defined by provider      . Client/Caregiver will verbalize understanding of instructions related to self-care and safety      .  Client/Caregivers will report improved coping with dementia in the next 3 months       Social Worker from [facility] will call you to provide resources for Adult Day care options     . HEMOGLOBIN A1C < 7.0       Diabetes self management actions:  Glucose monitoring per provider recommendations  Eat Healthy  Check feet daily  Visit provider every 3-6 months as directed  Hbg A1C level every 3-6 months.  Eye Exam yearly    . Maintain timely refills of diabetic medication as prescribed within the year .      Marland Kitchen. Obtain annual  Lipid Profile, LDL-C      . Obtain Annual Eye (retinal)  Exam       . Obtain Annual Foot Exam      . Obtain annual screen for micro albuminuria (urine) , nephropathy (kidney problems)      . Obtain Hemoglobin A1C at least 2 times per year      . Visit Primary Care Provider or Endocrinologist at least 2 times per year        Caregiver reports client has help with her ADLs/IADLs from all of her children.  Reports clients husband is blind so her is limited help.  Meeting diabetes self management goals with last hemoglobin A1C 5.9%.  Needs assistance with community resources and Advanced Directives.  Plan:  Send successful outreach  letter with a copy of their individualized care plan, Send individual care plan to provider and Send educational material  Chronic care management coordination will outreach in:  3 Months  Will refer client to:  Social Work   Dudley Major RN, [employer], CDE Chronic Care Producer, television/film/video Care Management [phone]

## 2018-09-27 ENCOUNTER — Other Ambulatory Visit: Payer: Self-pay | Admitting: Internal Medicine

## 2018-10-01 ENCOUNTER — Telehealth: Payer: Self-pay | Admitting: Internal Medicine

## 2018-10-01 ENCOUNTER — Other Ambulatory Visit: Payer: Self-pay

## 2018-10-01 MED ORDER — SPIRONOLACTONE 25 MG PO TABS: ORAL_TABLET | ORAL | 1 refills | Status: DC

## 2018-10-01 MED ORDER — ESCITALOPRAM OXALATE 10 MG PO TABS: 10.0000 mg | ORAL_TABLET | Freq: Every day | ORAL | 3 refills | Status: DC

## 2018-10-01 NOTE — Telephone Encounter (Signed)
Copied from CRM [id number]. Topic: Quick Communication - Rx Refill/Question >> Oct 01, 2018  9:42 AM Crist Infante wrote: Medication: spironolactone (ALDACTONE) 25 MG tablet escitalopram (LEXAPRO) 10 MG tablet 90 day Pt has changed to Health Team advantage and needs all meds sent to:  [facility] [place], [place] - [address] [phone] (Phone) [phone] (Fax)

## 2018-10-01 NOTE — Telephone Encounter (Signed)
Requested Prescriptions  Pending Prescriptions Disp Refills  . spironolactone (ALDACTONE) 25 MG tablet 90 tablet 1    Sig: TAKE 1 TABLET EVERY DAY     Cardiovascular: Diuretics - Aldosterone Antagonist Passed - 10/01/2018  9:52 AM      Passed - Cr in normal range and within 360 days    Creatinine, Ser  Date Value Ref Range Status  09/03/2018 1.06 0.40 - 1.20 mg/dL Final         Passed - K in normal range and within 360 days    Potassium  Date Value Ref Range Status  09/03/2018 4.6 3.5 - 5.1 mEq/L Final         Passed - Na in normal range and within 360 days    Sodium  Date Value Ref Range Status  09/03/2018 144 135 - 145 mEq/L Final         Passed - Last BP in normal range    BP Readings from Last 1 Encounters:  09/03/18 126/82         Passed - Valid encounter within last 6 months    Recent Outpatient Visits          4 weeks ago Essential hypertension   [facility] Plotnikov, Georgina Quint, MD   1 month ago Diabetes mellitus type 2 in obese [facility])   [facility] [facility], Georgina Quint, MD   3 months ago Hematochezia   [facility] -[facility] Gulling, MD   4 months ago Diabetes mellitus type 2 in obese [facility])   [facility] Plotnikov, Georgina Quint, MD   7 months ago Diabetes mellitus type 2 in obese [facility])   [facility] Plotnikov, Georgina Quint, MD      Future Appointments            In 2 months Plotnikov, Georgina Quint, MD [facility]         . escitalopram (LEXAPRO) 10 MG tablet 90 tablet 3    Sig: Take 1 tablet (10 mg total) by mouth daily.     Psychiatry:  Antidepressants - SSRI Passed - 10/01/2018  9:52 AM      Passed - Valid encounter within last 6 months    Recent Outpatient Visits          4 weeks ago Essential hypertension   [facility] Plotnikov, Georgina Quint, MD   1 month ago Diabetes mellitus  type 2 in obese [facility])   [facility] [facility], Georgina Quint, MD   3 months ago Hematochezia   [facility] -[facility] Gulling, MD   4 months ago Diabetes mellitus type 2 in obese [facility])   [facility] Plotnikov, Georgina Quint, MD   7 months ago Diabetes mellitus type 2 in obese [facility])   [facility] Plotnikov, Georgina Quint, MD      Future Appointments            In 2 months Plotnikov, Georgina Quint, MD [facility] -[place], [place]

## 2018-10-01 NOTE — Patient Outreach (Addendum)
[name]) Care Management  10/01/2018  KRISELDA SPILLMAN [birth year] [id number]   Successful outreach to patient's daughter, [name], regarding social work referral for assistance with "Advance Directives and Adult Day Care resources." [name] reported that multiple members of the family are providing care for her mother and father.  She expressed concern that this will be overwhelming for the family as her mother's dementia progresses.  Her and other family members have discussed long term care but they want to attempt to provide care in her home for the time being.  BSW inquired about whether or not patient has applied for Medicaid.  [name] reported that patient and spouse previously applied but were not qualified due to being over income limit.  BSW and [name] discussed multiple services including in-home support, day programs, respite programs, and caregiver support programs.  [name] requested that BSW send her as much information on resources as possible.  BSW and [name] also discussed Merchant navy officer.  BSW educated her on [facility] POA and Living Will.  BSW agreed to send Advance Directive packet and EMMI.  The following other resources were mailed: Senior Resources of [employer] Brochure  [employer] ACE/[employer] List of local Adult Day Centers Home Care Providers [place] [employer] Brochure Choice Journalist, newspaper Area on Aging Brochure List of local Dementia Support Groups Dementia Alliance of [place] Pamphlet Alzheimer's Association Pamphlet [employer] on Aging-Caring for a Person with Alzheimer's Disease BSW will follow up with [name] within the next two weeks to ensure receipt of resources and to further review Advance Directives.   Malachy Chamber, BSW Social Worker [phone]

## 2018-10-02 ENCOUNTER — Other Ambulatory Visit: Payer: Self-pay

## 2018-10-02 NOTE — Patient Outreach (Signed)
  [name]) Care Management Chronic Special Needs Program   10/02/2018  Name: Tracy [name], DOB: 06/01/1941  MRN: [id number]  The client was discussed in today's interdisciplinary care team meeting.  The following issues were discussed:  Client's needs, Key risk triggers/risk stratification, Care Plan, Coordination of care and Issues/barriers to care  Participants present: Kathyrn SheriffJuana Wallace, MSN, RN, CCM   Harleen Fineberg RN,BSN,CCM, CDE     Abner GreenspanBeth Hodges, MD Charlott RakesFrancisco Hodges, MD  Recommendations:  Continue to have social worker work with family for community resources and Advanced Directives  Plan: Social worker to work with family  Follow-up:  In 3 months as per tier level or as needed.  Dudley MajorMelissa Jai Bear RN, Maximiano CossBSN,CCM, CDE Chronic Care Management Coordinator [name] Care Management [phone]

## 2018-10-15 ENCOUNTER — Other Ambulatory Visit: Payer: Self-pay

## 2018-10-15 ENCOUNTER — Ambulatory Visit: Payer: Self-pay

## 2018-10-15 NOTE — Patient Outreach (Signed)
[name]) Care Management  10/15/2018  Tracy [name] June 21, 1941 [id number]   Unsuccessful outreach to patient's daughter, [name], to ensure receipt of resources mailed on 10/01/18.  Unable to leave message due to voicemail box being full. Will attempt to reach again within four business days.  Malachy Chamber, BSW Social Worker [phone]

## 2018-10-15 NOTE — Telephone Encounter (Signed)
[facility] yet [facility] - the fax number is [phone]. Please advise

## 2018-10-16 ENCOUNTER — Ambulatory Visit: Payer: Self-pay

## 2018-10-17 ENCOUNTER — Other Ambulatory Visit: Payer: Self-pay

## 2018-10-17 NOTE — Telephone Encounter (Signed)
Med list faxed

## 2018-10-17 NOTE — Patient Outreach (Signed)
[name]) Care Management  10/17/2018  Tracy [name] 1941/03/21 [id number]   Successful outreach to patient's daughter, [name], to ensure receipt of resources that were mailed on 10/01/18.  [name] confirmed receipt and had actually just left [facility] at the time of our call.  She is working to complete all the necessary paperwork for patient to participate in this program.  [name] denied need for additional resources at this time.  BSW is closing case but encouraged her to call if additional needs arise.    Malachy Chamber, BSW Social Worker [phone]

## 2018-10-18 ENCOUNTER — Other Ambulatory Visit: Payer: Self-pay | Admitting: Internal Medicine

## 2018-10-18 MED ORDER — ESCITALOPRAM OXALATE 10 MG PO TABS: 10.0000 mg | ORAL_TABLET | Freq: Every day | ORAL | 3 refills | Status: DC

## 2018-10-18 MED ORDER — SPIRONOLACTONE 25 MG PO TABS: ORAL_TABLET | ORAL | 1 refills | Status: DC

## 2018-10-18 NOTE — Telephone Encounter (Signed)
Pt wanting meds to different pharmacy Requested Prescriptions  Pending Prescriptions Disp Refills  . escitalopram (LEXAPRO) 10 MG tablet 90 tablet 3    Sig: Take 1 tablet (10 mg total) by mouth daily.     Psychiatry:  Antidepressants - SSRI Passed - 10/18/2018  9:32 AM      Passed - Valid encounter within last 6 months    Recent Outpatient Visits          1 month ago Essential hypertension   [facility] Plot[facility], Georgina Quint, MD   2 months ago Diabetes mellitus type 2 in obese [facility])   [facility] [facility], Georgina Quint, MD   4 months ago Hematochezia   [facility] -[facility] Gulling, MD   5 months ago Diabetes mellitus type 2 in obese [facility])   [facility] -[place] Plotnikov, Georgina Quint, MD   8 months ago Diabetes mellitus type 2 in obese [facility])   [facility] -[place] Plotnikov, Georgina Quint, MD      Future Appointments            In 1 month Plotnikov, Georgina Quint, MD [facility], [facility]         . spironolactone (ALDACTONE) 25 MG tablet 90 tablet 1    Sig: TAKE 1 TABLET EVERY DAY     Cardiovascular: Diuretics - Aldosterone Antagonist Passed - 10/18/2018  9:32 AM      Passed - Cr in normal range and within 360 days    Creatinine, Ser  Date Value Ref Range Status  09/03/2018 1.06 0.40 - 1.20 mg/dL Final         Passed - K in normal range and within 360 days    Potassium  Date Value Ref Range Status  09/03/2018 4.6 3.5 - 5.1 mEq/L Final         Passed - Na in normal range and within 360 days    Sodium  Date Value Ref Range Status  09/03/2018 144 135 - 145 mEq/L Final         Passed - Last BP in normal range    BP Readings from Last 1 Encounters:  09/03/18 126/82         Passed - Valid encounter within last 6 months    Recent Outpatient Visits          1 month ago Essential hypertension   [facility] Plotnikov, Georgina Quint,  MD   2 months ago Diabetes mellitus type 2 in obese [facility])   [facility] Plotnikov, Georgina Quint, MD   4 months ago Hematochezia   [facility] -Clair Gulling, MD   5 months ago Diabetes mellitus type 2 in obese [facility])   [facility] -[place] Plotnikov, Georgina Quint, MD   8 months ago Diabetes mellitus type 2 in obese [facility])   [facility] -[place] Plotnikov, Georgina Quint, MD      Future Appointments            In 1 month Plotnikov, Georgina Quint, MD [facility] Primary Care -[place], [place]

## 2018-10-18 NOTE — Telephone Encounter (Signed)
Copied from CRM [id number]. Topic: Quick Communication - Rx Refill/Question >> Oct 18, 2018  9:19 AM Jaquita Rector A wrote: Medication: escitalopram (LEXAPRO) 10 MG tablet, spironolactone (ALDACTONE) 25 MG tablet  Has the patient contacted their pharmacy? Yes.   (Agent: If no, request that the patient contact the pharmacy for the refill.) (Agent: If yes, when and what did the pharmacy advise?)  Preferred Pharmacy (with phone number or street name): [id number] [place] - [address] [phone] (Phone) [phone] (Fax)    Agent: Please be advised that RX refills may take up to 3 business days. We ask that you follow-up with your pharmacy.

## 2018-10-18 NOTE — Telephone Encounter (Signed)
Patient son  Tracy [name] called to say that the mail order pharmacy have yet to receive the medication list and his mom is out of the medications submitted for refill. He is asking for the patient medication list to be faxed to  Fax# [phone] and requesting a call back when process have taken place. Also asking for Dr Posey Rea to please follow up with this process. Please advise

## 2018-10-18 NOTE — Telephone Encounter (Signed)
LM notifying [name] that med list was faxed

## 2018-10-21 ENCOUNTER — Other Ambulatory Visit: Payer: Self-pay | Admitting: Internal Medicine

## 2018-10-21 ENCOUNTER — Telehealth: Payer: Self-pay | Admitting: Internal Medicine

## 2018-10-21 MED ORDER — SPIRONOLACTONE 25 MG PO TABS: ORAL_TABLET | ORAL | 1 refills | Status: DC

## 2018-10-21 MED ORDER — SPIRONOLACTONE 25 MG PO TABS: ORAL_TABLET | ORAL | 0 refills | Status: DC

## 2018-10-21 MED ORDER — METFORMIN HCL 500 MG PO TABS: 500.0000 mg | ORAL_TABLET | Freq: Every day | ORAL | 1 refills | Status: DC

## 2018-10-21 NOTE — Telephone Encounter (Signed)
RX sent

## 2018-10-21 NOTE — Telephone Encounter (Signed)
Copied from CRM [id number]. Topic: Quick Communication - Rx Refill/Question >> Oct 21, 2018 12:34 PM Arlyss Gandy, NT wrote: Medication: metFORMIN (GLUCOPHAGE) 500 MG tablet and donepezil (ARICEPT) 10 MG tablet   Has the patient contacted their pharmacy? Yes.   (Agent: If no, request that the patient contact the pharmacy for the refill.) (Agent: If yes, when and what did the pharmacy advise?)  Preferred Pharmacy (with phone number or street name): [facility]) - [place], [place] - [address] [place] [phone] (Phone) [phone] (Fax)    Agent: Please be advised that RX refills may take up to 3 business days. We ask that you follow-up with your pharmacy.

## 2018-10-21 NOTE — Telephone Encounter (Signed)
Copied from CRM [id number]. Topic: Quick Communication - Rx Refill/Question >> Oct 21, 2018 12:44 PM Fanny Bien wrote: Medication: spironolactone (ALDACTONE) 25 MG tablet [[id number]] escitalopram (LEXAPRO) 10 MG tablet [[id number]] called pharmacy and pharmacy does not have this because they lost power the day that these were sent over.  Pt states that she needs some sent in until mail order can send medication in  [id number] [place], [place] - [address] AT [facility] [address] [phone] (Phone) [phone] (Fax)     Please resend rest   [facility]) - [place], [place] - [address] [place] [phone] (Phone) [phone] (Fax)

## 2018-10-30 ENCOUNTER — Telehealth: Payer: Self-pay | Admitting: Internal Medicine

## 2018-10-30 NOTE — Telephone Encounter (Signed)
Unable to reach [name], will try to call later

## 2018-10-30 NOTE — Telephone Encounter (Signed)
Copied from CRM [id number]. Topic: Quick Communication - See Telephone Encounter >> Oct 30, 2018  9:47 AM Angela Nevin wrote: CRM for notification. See Telephone encounter for: 10/30/18.  Patient's son, [name], calling on behalf of patient stating that they have contacted office multiple times trying to get all prescriptions on file (list of prescriptions) faxed to mail order pharmacy. Please advise.    Pharmacy:[facility]) - [place], [place] - [address] [place]  [phone] (Phone) [phone] (Fax)

## 2018-10-31 ENCOUNTER — Ambulatory Visit
Admission: RE | Admit: 2018-10-31 | Discharge: 2018-10-31 | Disposition: A | Discharge status: Home / Self Care | Payer: HMO | Source: Ambulatory Visit | Attending: Internal Medicine | Admitting: Internal Medicine

## 2018-10-31 DIAGNOSIS — Z1231 Encounter for screening mammogram for malignant neoplasm of breast: Secondary | ICD-10-CM

## 2018-11-18 ENCOUNTER — Other Ambulatory Visit: Payer: Self-pay | Admitting: Internal Medicine

## 2018-11-20 ENCOUNTER — Other Ambulatory Visit: Payer: Self-pay | Admitting: Internal Medicine

## 2018-11-20 MED ORDER — DONEPEZIL HCL 10 MG PO TABS: ORAL_TABLET | ORAL | 1 refills | Status: DC

## 2018-11-20 MED ORDER — ESCITALOPRAM OXALATE 10 MG PO TABS: 10.0000 mg | ORAL_TABLET | Freq: Every day | ORAL | 3 refills | Status: DC

## 2018-11-20 NOTE — Telephone Encounter (Signed)
Pt switching to local pharmacy Requested Prescriptions  Pending Prescriptions Disp Refills  . donepezil (ARICEPT) 10 MG tablet 90 tablet 1    Sig: TAKE 1 TABLET (10 MG TOTAL) BY MOUTH AT BEDTIME.     Neurology:  Alzheimer's Agents Passed - 11/20/2018 10:25 AM      Passed - Valid encounter within last 6 months    Recent Outpatient Visits          2 months ago Essential hypertension   [facility] Plotnikov, Georgina Quint, MD   3 months ago Diabetes mellitus type 2 in obese [facility])   [facility] -[place] Plotnikov, Georgina Quint, MD   5 months ago Hematochezia   [facility] -[facility] Gulling, MD   6 months ago Diabetes mellitus type 2 in obese [facility])   [facility] -[place] Plotnikov, Georgina Quint, MD   9 months ago Diabetes mellitus type 2 in obese [facility])   [facility] -[place] Plotnikov, Georgina Quint, MD      Future Appointments            In 2 weeks Plotnikov, Georgina Quint, MD [facility], [facility]         . escitalopram (LEXAPRO) 10 MG tablet 90 tablet 3    Sig: Take 1 tablet (10 mg total) by mouth daily.     Psychiatry:  Antidepressants - SSRI Passed - 11/20/2018 10:25 AM      Passed - Valid encounter within last 6 months    Recent Outpatient Visits          2 months ago Essential hypertension   [facility] Plotnikov, Georgina Quint, MD   3 months ago Diabetes mellitus type 2 in obese [facility])   [facility] Plotnikov, Georgina Quint, MD   5 months ago Hematochezia   [facility] -Clair Gulling, MD   6 months ago Diabetes mellitus type 2 in obese [facility])   [facility] -[place] Plotnikov, Georgina Quint, MD   9 months ago Diabetes mellitus type 2 in obese [facility])   [facility] -[place] Plotnikov, Georgina Quint, MD      Future Appointments            In 2 weeks Plotnikov, Georgina Quint, MD  [facility] -[place], [place]

## 2018-11-20 NOTE — Telephone Encounter (Signed)
Copied from CRM #[id number]. Topic: Quick Communication - Rx Refill/Question °>> Nov 20, 2018  9:42 AM Kennedy, Cheryl W wrote: °Medication    donepezil (ARICEPT) 10 MG tablet  °                        escitalopram (LEXAPRO) 10 MG tablet  ° °Please resend meds was sent to incorrect pharmacy and one of the meds sent 10/2018 pharmacy said did not received  ° °[facility]  ° ° ° ° ° ° ° ° °Preferred Pharmacy (with phone number or street name): *** ° °Agent: Please be advised that RX refills may take up to 3 business days. We ask that you follow-up with your pharmacy. °

## 2018-11-20 NOTE — Telephone Encounter (Signed)
Copied from CRM [id number]. Topic: Quick Communication - Rx Refill/Question >> Nov 20, 2018  9:42 AM Percival Spanish wrote: Medication    donepezil (ARICEPT) 10 MG tablet                          escitalopram (LEXAPRO) 10 MG tablet   Please resend meds was sent to incorrect pharmacy and one of the meds sent 10/2018 pharmacy said did not received   [facility]          Preferred Pharmacy (with phone number or street name): ***  Agent: Please be advised that RX refills may take up to 3 business days. We ask that you follow-up with your pharmacy.

## 2018-11-20 NOTE — Telephone Encounter (Signed)
Copied from CRM [id number]. Topic: Quick Communication - Rx Refill/Question >> Nov 20, 2018  9:50 AM Percival Spanish wrote: Medication    donepezil (ARICEPT) 10 MG tablet Sent to wrong pharmacy                           escitalopram (LEXAPRO) 10 MG tablet  [facility] not [facility]    Please resend meds to below pharmacy   [facility]

## 2018-12-04 ENCOUNTER — Ambulatory Visit: Payer: Medicare HMO | Admitting: Internal Medicine

## 2018-12-24 ENCOUNTER — Other Ambulatory Visit: Payer: Self-pay

## 2018-12-24 NOTE — Patient Outreach (Signed)
[name]) Care Management Chronic Special Needs Program  12/24/2018  Name: Tracy [name] DOB: [birth year]  MRN: [id number]  [name] is enrolled in a chronic special needs plan for Diabetes. Reviewed and updated care plan. Spoke with daughter Tracy [name] per client's permission Subjective: States that client has been doing well.  States her family decided to wait on Adult Day care or PACE at this time and they might look into it later.  States that they have the resources that the Child psychotherapist gave them.  States that she and her siblings are coping well with caring for client at this time.  States that they are keeping client isolated and they are limiting who comes in contact with her.   Goals Addressed            This Visit's Progress   . COMPLETED:  Acknowledge receipt of Medical illustrator      . Advanced Care Planning complete by  next 9 months   On track   . Client understands the importance of follow-up with providers by attending scheduled visits   On track   . Client will not report change from baseline and no repeated symptoms of stroke with in the next 3 months    On track   . Client will report no fall or injuries in the next 3 months.   On track   . Client will use Assistive Devices as needed and verbalize understanding of device use   On track   . Client will verbalize knowledge of self management of Hypertension as evidences by BP reading of 140/90 or less; or as defined by provider   No change   . Client/Caregiver will verbalize understanding of instructions related to self-care and safety   On track   . COMPLETED: Client/Caregivers will report improved coping with dementia in the next 3 months       Social Worker from [employer] will call you to provide resources for Adult Day care options     . HEMOGLOBIN A1C < 7.0       Continue Diabetes self management actions:  Glucose monitoring per provider recommendations   Eat Healthy  Check feet daily  Visit provider every 3-6 months as directed  Hbg A1C level every 3-6 months.  Eye Exam yearly    . Maintain timely refills of diabetic medication as prescribed within the year .   On track   . Obtain annual  Lipid Profile, LDL-C   On track   . Obtain Annual Eye (retinal)  Exam    On track   . Obtain Annual Foot Exam   On track   . Obtain annual screen for micro albuminuria (urine) , nephropathy (kidney problems)   On track   . Obtain Hemoglobin A1C at least 2 times per year   On track   . Visit Primary Care Provider or Endocrinologist at least 2 times per year    On track    Client is meeting diabetes self management goal of hemoglobin A1C of <7% with last reading of 5.9%  Family has discussed resources with Social Work and they have closed their case.  They are not pursuing Adult Day care or PACE at this time due to COVID 19 and they are coping well at this time.   Discussed COVID19 cause, symptoms, precautions (social distancing, stay at home order, hand washing), confirmed client/family knows how to contact provider.  Plan:  Send successful outreach  letter with a copy of their individualized care plan and Send individual care plan to provider  Chronic care management coordinator will outreach in:  6 Months    Dudley MajorMelissa Salah Burlison RN, [facility] LLCBSN,CCM, CDE Chronic Care Management Coordinator [name] Care Management [phone]

## 2019-03-24 ENCOUNTER — Ambulatory Visit (INDEPENDENT_AMBULATORY_CARE_PROVIDER_SITE_OTHER): Payer: HMO | Admitting: Internal Medicine

## 2019-03-24 ENCOUNTER — Other Ambulatory Visit: Payer: Self-pay

## 2019-03-24 ENCOUNTER — Other Ambulatory Visit (INDEPENDENT_AMBULATORY_CARE_PROVIDER_SITE_OTHER): Payer: HMO

## 2019-03-24 ENCOUNTER — Encounter: Payer: Self-pay | Admitting: Internal Medicine

## 2019-03-24 VITALS — BP 120/74 | HR 65 | Temp 98.2°F | Ht 66.0 in | Wt 146.0 lb

## 2019-03-24 DIAGNOSIS — E669 Obesity, unspecified: Secondary | ICD-10-CM | POA: Diagnosis not present

## 2019-03-24 DIAGNOSIS — E1159 Type 2 diabetes mellitus with other circulatory complications: Secondary | ICD-10-CM | POA: Diagnosis not present

## 2019-03-24 DIAGNOSIS — F03918 Unspecified dementia, unspecified severity, with other behavioral disturbance: Secondary | ICD-10-CM

## 2019-03-24 DIAGNOSIS — I6310 Cerebral infarction due to embolism of unspecified precerebral artery: Secondary | ICD-10-CM

## 2019-03-24 DIAGNOSIS — I10 Essential (primary) hypertension: Secondary | ICD-10-CM | POA: Diagnosis not present

## 2019-03-24 DIAGNOSIS — F0391 Unspecified dementia with behavioral disturbance: Secondary | ICD-10-CM

## 2019-03-24 DIAGNOSIS — E1169 Type 2 diabetes mellitus with other specified complication: Secondary | ICD-10-CM

## 2019-03-24 LAB — BASIC METABOLIC PANEL
BUN: 21 mg/dL (ref 6–23)
CO2: 28 mEq/L (ref 19–32)
Calcium: 9.4 mg/dL (ref 8.4–10.5)
Chloride: 108 mEq/L (ref 96–112)
Creatinine, Ser: 1.04 mg/dL (ref 0.40–1.20)
GFR: 62.06 mL/min (ref >60.00)
Glucose, Bld: 97 mg/dL (ref 70–99)
Potassium: 4.7 mEq/L (ref 3.5–5.1)
Sodium: 146 mEq/L — ABNORMAL HIGH (ref 135–145)

## 2019-03-24 LAB — HEMOGLOBIN A1C: Hgb A1c MFr Bld: 5.9 % (ref 4.6–6.5)

## 2019-03-24 MED ORDER — DONEPEZIL HCL 10 MG PO TABS: ORAL_TABLET | ORAL | 3 refills | Status: DC

## 2019-03-24 MED ORDER — FAMOTIDINE 40 MG PO TABS: 40.0000 mg | ORAL_TABLET | Freq: Every day | ORAL | 3 refills | Status: DC

## 2019-03-24 MED ORDER — METFORMIN HCL 500 MG PO TABS: 500.0000 mg | ORAL_TABLET | Freq: Every day | ORAL | 3 refills | Status: DC

## 2019-03-24 MED ORDER — SPIRONOLACTONE 25 MG PO TABS: ORAL_TABLET | ORAL | 3 refills | Status: DC

## 2019-03-24 MED ORDER — QUETIAPINE FUMARATE 100 MG PO TABS: 100.0000 mg | ORAL_TABLET | Freq: Two times a day (BID) | ORAL | 1 refills | Status: DC

## 2019-03-24 NOTE — Assessment & Plan Note (Signed)
ASA No relapse 

## 2019-03-24 NOTE — Assessment & Plan Note (Signed)
Labs

## 2019-03-24 NOTE — Progress Notes (Signed)
Subjective:  Patient ID: Tracy [name], female    DOB: 10/01/1940  Age: 78 y.o. MRN: [id number]  CC: No chief complaint on file.   HPI Tracy [name] presents for dementia, GERD,  Depression C/o confusion, agitation - worse  Outpatient Medications Prior to Visit  Medication Sig Dispense Refill  . acetaminophen (TYLENOL) 325 MG tablet Take 650 mg by mouth every 6 (six) hours as needed for pain.    Marland Kitchen. aspirin EC 81 MG tablet Take 81 mg by mouth daily.    . bisacodyl (DULCOLAX) 5 MG EC tablet Take 1-2 tablets (5-10 mg total) by mouth daily as needed for moderate constipation. 60 tablet 5  . cetaphil (CETAPHIL) lotion Apply 1 application topically 2 (two) times daily.    . cholecalciferol (VITAMIN D) 1000 UNITS tablet Take 1,000 Units by mouth daily.    . CVS VITAMIN B12 1000 MCG tablet TAKE 1 TABLET EVERY DAY 90 tablet 3  . donepezil (ARICEPT) 10 MG tablet TAKE 1 TABLET (10 MG TOTAL) BY MOUTH AT BEDTIME. 90 tablet 1  . escitalopram (LEXAPRO) 10 MG tablet Take 1 tablet (10 mg total) by mouth daily. 90 tablet 3  . esomeprazole (NEXIUM) 40 MG capsule TAKE 1 CAPSULE BY MOUTH EVERY DAY 90 capsule 3  . gabapentin (NEURONTIN) 100 MG capsule TAKE 2 CAPSULES (200 MG TOTAL) BY MOUTH AT BEDTIME. 180 capsule 1  . glucose blood test strip One touch Verio Flex.  Use as instructed to test sugars daily and prn.  E11.59 100 each 12  . hydrocortisone (ANUSOL-HC) 25 MG suppository Place 1 suppository (25 mg total) rectally every 12 (twelve) hours. 12 suppository 1  . Ibuprofen-Famotidine 800-26.6 MG TABS Take 1 tablet by mouth 2 (two) times daily as needed (for pain).    . lubiprostone (AMITIZA) 24 MCG capsule Take 24 mcg by mouth 2 (two) times daily as needed for constipation.    . meclizine (ANTIVERT) 12.5 MG tablet Take 12.5 mg by mouth as needed for dizziness.    . metFORMIN (GLUCOPHAGE) 500 MG tablet Take 1 tablet (500 mg total) by mouth daily with breakfast. 90 tablet 1  . polyethylene glycol  powder (GLYCOLAX/MIRALAX) powder Take 1 capful in 10 oz of fluid 2 times daily until you are having daily soft stools 250 g 0  . QUEtiapine (SEROQUEL) 50 MG tablet TAKE 1 TABLET BY MOUTH TWICE A DAY 180 tablet 2  . ranitidine (ZANTAC) 150 MG tablet TAKE 1 TABLET BY MOUTH TWICE A DAY 180 tablet 3  . spironolactone (ALDACTONE) 25 MG tablet TAKE 1 TABLET EVERY DAY 90 tablet 1   No facility-administered medications prior to visit.     ROS: Review of Systems  Constitutional: Negative for activity change, appetite change, chills, fatigue and unexpected weight change.  HENT: Negative for congestion, mouth sores and sinus pressure.   Eyes: Negative for visual disturbance.  Respiratory: Negative for cough and chest tightness.   Gastrointestinal: Negative for abdominal pain and nausea.  Genitourinary: Negative for difficulty urinating, frequency and vaginal pain.  Musculoskeletal: Negative for back pain and gait problem.  Skin: Negative for pallor and rash.  Neurological: Negative for dizziness, tremors, weakness, numbness and headaches.  Psychiatric/Behavioral: Positive for behavioral problems, confusion, decreased concentration, dysphoric mood and hallucinations. Negative for self-injury, sleep disturbance and suicidal ideas. The patient is nervous/anxious.     Objective:  BP 120/74 (BP Location: Right Arm, Patient Position: Sitting, Cuff Size: Normal)   Pulse 65   Temp 98.2 F (  36.8 C) (Oral)   Ht 5\' 6"  (1.676 m)   Wt 146 lb (66.2 kg)   SpO2 99%   BMI 23.57 kg/m   BP Readings from Last 3 Encounters:  03/24/19 120/74  09/03/18 126/82  08/12/18 126/78    Wt Readings from Last 3 Encounters:  03/24/19 146 lb (66.2 kg)  09/03/18 157 lb (71.2 kg)  08/12/18 155 lb (70.3 kg)    Physical Exam Constitutional:      General: She is not in acute distress.    Appearance: She is well-developed.  HENT:     Head: Normocephalic.     Right Ear: External ear normal.     Left Ear: External  ear normal.     Nose: Nose normal.  Eyes:     General:        Right eye: No discharge.        Left eye: No discharge.     Conjunctiva/sclera: Conjunctivae normal.     Pupils: Pupils are equal, round, and reactive to light.  Neck:     Musculoskeletal: Normal range of motion and neck supple.     Thyroid: No thyromegaly.     Vascular: No JVD.     Trachea: No tracheal deviation.  Cardiovascular:     Rate and Rhythm: Normal rate and regular rhythm.     Heart sounds: Normal heart sounds.  Pulmonary:     Effort: No respiratory distress.     Breath sounds: No stridor. No wheezing.  Abdominal:     General: Bowel sounds are normal. There is no distension.     Palpations: Abdomen is soft. There is no mass.     Tenderness: There is no abdominal tenderness. There is no guarding or rebound.  Musculoskeletal:        General: No tenderness.  Lymphadenopathy:     Cervical: No cervical adenopathy.  Skin:    Findings: No erythema or rash.  Neurological:     Mental Status: She is disoriented.     Cranial Nerves: No cranial nerve deficit.     Motor: No abnormal muscle tone.     Coordination: Coordination abnormal.     Deep Tendon Reflexes: Reflexes normal.  Psychiatric:        Thought Content: Thought content normal.   fussing Moody, disoriented, agitated at times   Lab Results  Component Value Date   WBC 4.9 06/05/2018   HGB 12.5 06/05/2018   HCT 37.2 06/05/2018   PLT 296.0 06/05/2018   GLUCOSE 110 (H) 09/03/2018   CHOL 129 10/27/2014   TRIG 82 10/27/2014   HDL 45 10/27/2014   LDLCALC 68 10/27/2014   ALT 8 05/17/2016   AST 14 05/17/2016   NA 144 09/03/2018   K 4.6 09/03/2018   CL 105 09/03/2018   CREATININE 1.06 09/03/2018   BUN 17 09/03/2018   CO2 30 09/03/2018   TSH 4.305 10/28/2014   INR 1.12 02/05/2015   HGBA1C 5.9 09/03/2018   MICROALBUR <0.7 05/17/2016    Mm 3d Screen Breast Bilateral  Result Date: 11/01/2018 CLINICAL DATA:  Screening. Please note, the patient  could not tolerate optimal positioning; the best possible images were obtained. EXAM: DIGITAL SCREENING BILATERAL MAMMOGRAM WITH TOMO AND CAD COMPARISON:  Previous exam(s). ACR Breast Density Category a: The breast tissue is almost entirely fatty. FINDINGS: There are no findings suspicious for malignancy. Images were processed with CAD. IMPRESSION: No mammographic evidence of malignancy. A result letter of this screening mammogram will be mailed  directly to the patient. RECOMMENDATION: Screening mammogram in one year. (Code:SM-B-01Y) BI-RADS CATEGORY  1: Negative. Electronically Signed   By: Sande BrothersSerena  Chacko M.D.   On: 11/01/2018 09:08    Assessment & Plan:   There are no diagnoses linked to this encounter.   No orders of the defined types were placed in this encounter.    Follow-up: No follow-ups on file.  Sonda PrimesAlex Gotti Alwin, MD

## 2019-03-24 NOTE — Assessment & Plan Note (Signed)
Worse Increase Seroquel [facility] ref - SW and RN consult

## 2019-03-24 NOTE — Assessment & Plan Note (Signed)
BP Readings from Last 3 Encounters:  03/24/19 120/74  09/03/18 126/82  08/12/18 126/78

## 2019-04-14 ENCOUNTER — Telehealth: Payer: Self-pay | Admitting: Internal Medicine

## 2019-04-14 NOTE — Telephone Encounter (Signed)
Eyvonne Mechanic pt's daughter called in to confirm receipt of Medical Certification form? She said that [employer] stated that they faxed over. If so, she would like to know the status?   CB: [phone]

## 2019-04-15 DIAGNOSIS — F411 Generalized anxiety disorder: Secondary | ICD-10-CM | POA: Diagnosis not present

## 2019-04-15 DIAGNOSIS — K219 Gastro-esophageal reflux disease without esophagitis: Secondary | ICD-10-CM | POA: Diagnosis not present

## 2019-04-15 DIAGNOSIS — F329 Major depressive disorder, single episode, unspecified: Secondary | ICD-10-CM | POA: Diagnosis not present

## 2019-04-15 DIAGNOSIS — Z9181 History of falling: Secondary | ICD-10-CM | POA: Diagnosis not present

## 2019-04-15 DIAGNOSIS — Z7984 Long term (current) use of oral hypoglycemic drugs: Secondary | ICD-10-CM | POA: Diagnosis not present

## 2019-04-15 DIAGNOSIS — E785 Hyperlipidemia, unspecified: Secondary | ICD-10-CM | POA: Diagnosis not present

## 2019-04-15 DIAGNOSIS — E119 Type 2 diabetes mellitus without complications: Secondary | ICD-10-CM | POA: Diagnosis not present

## 2019-04-15 DIAGNOSIS — D509 Iron deficiency anemia, unspecified: Secondary | ICD-10-CM | POA: Diagnosis not present

## 2019-04-15 DIAGNOSIS — Z8673 Personal history of transient ischemic attack (TIA), and cerebral infarction without residual deficits: Secondary | ICD-10-CM | POA: Diagnosis not present

## 2019-04-15 DIAGNOSIS — I10 Essential (primary) hypertension: Secondary | ICD-10-CM | POA: Diagnosis not present

## 2019-04-15 DIAGNOSIS — M5010 Cervical disc disorder with radiculopathy, unspecified cervical region: Secondary | ICD-10-CM | POA: Diagnosis not present

## 2019-04-15 DIAGNOSIS — F0391 Unspecified dementia with behavioral disturbance: Secondary | ICD-10-CM | POA: Diagnosis not present

## 2019-04-15 DIAGNOSIS — G4700 Insomnia, unspecified: Secondary | ICD-10-CM | POA: Diagnosis not present

## 2019-04-28 DIAGNOSIS — M5010 Cervical disc disorder with radiculopathy, unspecified cervical region: Secondary | ICD-10-CM

## 2019-04-28 DIAGNOSIS — E785 Hyperlipidemia, unspecified: Secondary | ICD-10-CM | POA: Diagnosis not present

## 2019-04-28 DIAGNOSIS — D509 Iron deficiency anemia, unspecified: Secondary | ICD-10-CM

## 2019-04-28 DIAGNOSIS — E119 Type 2 diabetes mellitus without complications: Secondary | ICD-10-CM | POA: Diagnosis not present

## 2019-04-28 DIAGNOSIS — F329 Major depressive disorder, single episode, unspecified: Secondary | ICD-10-CM | POA: Diagnosis not present

## 2019-04-28 DIAGNOSIS — Z7984 Long term (current) use of oral hypoglycemic drugs: Secondary | ICD-10-CM | POA: Diagnosis not present

## 2019-04-28 DIAGNOSIS — G4700 Insomnia, unspecified: Secondary | ICD-10-CM | POA: Diagnosis not present

## 2019-04-28 DIAGNOSIS — F0391 Unspecified dementia with behavioral disturbance: Secondary | ICD-10-CM | POA: Diagnosis not present

## 2019-04-28 DIAGNOSIS — I10 Essential (primary) hypertension: Secondary | ICD-10-CM

## 2019-04-28 DIAGNOSIS — F411 Generalized anxiety disorder: Secondary | ICD-10-CM | POA: Diagnosis not present

## 2019-04-28 DIAGNOSIS — Z8673 Personal history of transient ischemic attack (TIA), and cerebral infarction without residual deficits: Secondary | ICD-10-CM

## 2019-04-28 DIAGNOSIS — Z9181 History of falling: Secondary | ICD-10-CM

## 2019-04-28 DIAGNOSIS — K219 Gastro-esophageal reflux disease without esophagitis: Secondary | ICD-10-CM | POA: Diagnosis not present

## 2019-04-30 DIAGNOSIS — I10 Essential (primary) hypertension: Secondary | ICD-10-CM | POA: Diagnosis not present

## 2019-04-30 DIAGNOSIS — Z9181 History of falling: Secondary | ICD-10-CM | POA: Diagnosis not present

## 2019-04-30 DIAGNOSIS — K219 Gastro-esophageal reflux disease without esophagitis: Secondary | ICD-10-CM | POA: Diagnosis not present

## 2019-04-30 DIAGNOSIS — F411 Generalized anxiety disorder: Secondary | ICD-10-CM | POA: Diagnosis not present

## 2019-04-30 DIAGNOSIS — E119 Type 2 diabetes mellitus without complications: Secondary | ICD-10-CM | POA: Diagnosis not present

## 2019-04-30 DIAGNOSIS — E785 Hyperlipidemia, unspecified: Secondary | ICD-10-CM | POA: Diagnosis not present

## 2019-04-30 DIAGNOSIS — F0391 Unspecified dementia with behavioral disturbance: Secondary | ICD-10-CM | POA: Diagnosis not present

## 2019-04-30 DIAGNOSIS — M5010 Cervical disc disorder with radiculopathy, unspecified cervical region: Secondary | ICD-10-CM | POA: Diagnosis not present

## 2019-04-30 DIAGNOSIS — G4700 Insomnia, unspecified: Secondary | ICD-10-CM | POA: Diagnosis not present

## 2019-04-30 DIAGNOSIS — Z8673 Personal history of transient ischemic attack (TIA), and cerebral infarction without residual deficits: Secondary | ICD-10-CM | POA: Diagnosis not present

## 2019-04-30 DIAGNOSIS — Z7984 Long term (current) use of oral hypoglycemic drugs: Secondary | ICD-10-CM | POA: Diagnosis not present

## 2019-04-30 DIAGNOSIS — F329 Major depressive disorder, single episode, unspecified: Secondary | ICD-10-CM | POA: Diagnosis not present

## 2019-04-30 DIAGNOSIS — D509 Iron deficiency anemia, unspecified: Secondary | ICD-10-CM | POA: Diagnosis not present

## 2019-05-12 ENCOUNTER — Other Ambulatory Visit: Payer: Self-pay | Admitting: Internal Medicine

## 2019-05-24 ENCOUNTER — Other Ambulatory Visit: Payer: Self-pay | Admitting: Internal Medicine

## 2019-06-15 ENCOUNTER — Other Ambulatory Visit: Payer: Self-pay | Admitting: Internal Medicine

## 2019-06-24 ENCOUNTER — Other Ambulatory Visit: Payer: Self-pay

## 2019-06-24 NOTE — Patient Outreach (Signed)
  [facility]) Care Management Chronic Special Needs Program  06/24/2019  Name: Tracy [name] DOB: 17-May-1941  MRN: [id number]  [name] is enrolled in a chronic special needs plan for Diabetes. Reviewed and updated care plan.  Spoke with [employer] daughter Tracy [name].  HIPPA verified Subjective: States that client's dementia continues to get worse.  States that her family now arrange for someone to be with her at all times.  States that they all take turns with her.  States that she has spoken with Education officer, museum about her care options and they do not want to have her place in a home yet.    Goals Addressed            This Visit's Progress   . Advanced Care Planning complete by  next 9 months   On track   . Client understands the importance of follow-up with providers by attending scheduled visits   On track   . Client will not report change from baseline and no repeated symptoms of stroke with in the next 6 months(continue 06/24/19)   On track   . Client will report no fall or injuries in the next 6 months(continue 06/24/19)   On track   . Client will use Assistive Devices as needed and verbalize understanding of device use   On track   . Client will verbalize knowledge of self management of Hypertension as evidences by BP reading of 140/90 or less; or as defined by provider   On track   . Client/Caregiver will verbalize understanding of instructions related to self-care and safety   On track   . HEMOGLOBIN A1C < 7.0        Diabetes self management actions:  Glucose monitoring per provider recommendations  Eat Healthy  Check feet daily  Visit provider every 3-6 months as directed  Hbg A1C level every 3-6 months.  Eye Exam yearly    . Maintain timely refills of diabetic medication as prescribed within the year .   On track   . Obtain annual  Lipid Profile, LDL-C   On track   . Obtain Annual Eye (retinal)  Exam    On track   .  Obtain Annual Foot Exam   On track   . Obtain annual screen for micro albuminuria (urine) , nephropathy (kidney problems)   On track   . Obtain Hemoglobin A1C at least 2 times per year   On track   . Visit Primary Care Provider or Endocrinologist at least 2 times per year    On track    Client is meeting diabetes self management goal of hemoglobin A1C of <7% with last reading of 5.9% Discussed having social work referral to again go over their different care options for client but declines a this time.   Encouraged daughter who is a Furniture conservator/restorer to take advantage of the Freight forwarder Program at [facility] to help with her caregiver stress Reviewed number for 24-hour nurse Line Reviewed COVID 19 precautions Plan:  Send successful outreach letter with a copy of their individualized care plan and Send individual care plan to provider  Chronic care management coordinator will outreach in:  4-6 Months     Peter Garter RN, [facility], [place] Management Coordinator [facility] [phone]

## 2019-07-01 ENCOUNTER — Ambulatory Visit: Payer: HMO | Admitting: Internal Medicine

## 2019-07-09 ENCOUNTER — Other Ambulatory Visit: Payer: Self-pay

## 2019-07-09 ENCOUNTER — Other Ambulatory Visit (INDEPENDENT_AMBULATORY_CARE_PROVIDER_SITE_OTHER): Payer: HMO

## 2019-07-09 ENCOUNTER — Encounter: Payer: Self-pay | Admitting: Internal Medicine

## 2019-07-09 ENCOUNTER — Ambulatory Visit (INDEPENDENT_AMBULATORY_CARE_PROVIDER_SITE_OTHER): Payer: HMO | Admitting: Internal Medicine

## 2019-07-09 VITALS — BP 122/76 | HR 73 | Temp 98.2°F | Ht 66.0 in | Wt 141.0 lb

## 2019-07-09 DIAGNOSIS — F0391 Unspecified dementia with behavioral disturbance: Secondary | ICD-10-CM

## 2019-07-09 DIAGNOSIS — Z23 Encounter for immunization: Secondary | ICD-10-CM | POA: Diagnosis not present

## 2019-07-09 DIAGNOSIS — F411 Generalized anxiety disorder: Secondary | ICD-10-CM

## 2019-07-09 DIAGNOSIS — I10 Essential (primary) hypertension: Secondary | ICD-10-CM

## 2019-07-09 DIAGNOSIS — E1159 Type 2 diabetes mellitus with other circulatory complications: Secondary | ICD-10-CM

## 2019-07-09 DIAGNOSIS — F03918 Unspecified dementia, unspecified severity, with other behavioral disturbance: Secondary | ICD-10-CM

## 2019-07-09 DIAGNOSIS — I6310 Cerebral infarction due to embolism of unspecified precerebral artery: Secondary | ICD-10-CM | POA: Diagnosis not present

## 2019-07-09 LAB — BASIC METABOLIC PANEL
BUN: 17 mg/dL (ref 6–23)
CO2: 29 mEq/L (ref 19–32)
Calcium: 9.4 mg/dL (ref 8.4–10.5)
Chloride: 106 mEq/L (ref 96–112)
Creatinine, Ser: 1.02 mg/dL (ref 0.40–1.20)
GFR: 63.42 mL/min (ref >60.00)
Glucose, Bld: 109 mg/dL — ABNORMAL HIGH (ref 70–99)
Potassium: 4.2 mEq/L (ref 3.5–5.1)
Sodium: 145 mEq/L (ref 135–145)

## 2019-07-09 LAB — HEMOGLOBIN A1C: Hgb A1c MFr Bld: 6 % (ref 4.6–6.5)

## 2019-07-09 MED ORDER — DOXEPIN HCL 25 MG PO CAPS: 25.0000 mg | ORAL_CAPSULE | Freq: Two times a day (BID) | ORAL | 3 refills | Status: DC

## 2019-07-09 NOTE — Assessment & Plan Note (Signed)
ASA 81 mg

## 2019-07-09 NOTE — Assessment & Plan Note (Signed)
Doxepine to try Seroquel intolerant

## 2019-07-09 NOTE — Assessment & Plan Note (Signed)
BP Readings from Last 3 Encounters:  07/09/19 122/76  03/24/19 120/74  09/03/18 126/82

## 2019-07-09 NOTE — Assessment & Plan Note (Signed)
Doxepine to try Seroquel intolerant 

## 2019-07-09 NOTE — Addendum Note (Signed)
Addended by: Karren Cobble on: 07/09/2019 11:15 AM   Modules accepted: Orders

## 2019-07-09 NOTE — Progress Notes (Signed)
Subjective:  Patient ID: Tracy [name], female    DOB: Apr 29, 1941  Age: 78 y.o. MRN: [id number]  CC: No chief complaint on file.   HPI Tracy [name] presents for dementia w/agitation. Seroquel is too strong at 1/2 - 1/4 - drooling F/u GERD, HTN   Outpatient Medications Prior to Visit  Medication Sig Dispense Refill  . acetaminophen (TYLENOL) 325 MG tablet Take 650 mg by mouth every 6 (six) hours as needed for pain.    Marland Kitchen aspirin EC 81 MG tablet Take 81 mg by mouth daily.    . bisacodyl (DULCOLAX) 5 MG EC tablet Take 1-2 tablets (5-10 mg total) by mouth daily as needed for moderate constipation. 60 tablet 5  . cetaphil (CETAPHIL) lotion Apply 1 application topically 2 (two) times daily.    . cholecalciferol (VITAMIN D) 1000 UNITS tablet Take 1,000 Units by mouth daily.    . CVS VITAMIN B12 1000 MCG tablet TAKE 1 TABLET EVERY DAY 90 tablet 3  . donepezil (ARICEPT) 10 MG tablet TAKE 1 TABLET BY MOUTH EVERYDAY AT BEDTIME 90 tablet 3  . escitalopram (LEXAPRO) 10 MG tablet Take 1 tablet (10 mg total) by mouth daily. 90 tablet 3  . esomeprazole (NEXIUM) 40 MG capsule TAKE 1 CAPSULE BY MOUTH EVERY DAY 90 capsule 3  . famotidine (PEPCID) 40 MG tablet Take 1 tablet (40 mg total) by mouth daily. 90 tablet 3  . gabapentin (NEURONTIN) 100 MG capsule TAKE 2 CAPSULES (200 MG TOTAL) BY MOUTH AT BEDTIME. 180 capsule 1  . glucose blood test strip One touch Verio Flex.  Use as instructed to test sugars daily and prn.  E11.59 100 each 12  . Ibuprofen-Famotidine 800-26.6 MG TABS Take 1 tablet by mouth 2 (two) times daily as needed (for pain).    . lubiprostone (AMITIZA) 24 MCG capsule Take 24 mcg by mouth 2 (two) times daily as needed for constipation.    . meclizine (ANTIVERT) 12.5 MG tablet Take 12.5 mg by mouth as needed for dizziness.    . metFORMIN (GLUCOPHAGE) 500 MG tablet Take 1 tablet (500 mg total) by mouth daily with breakfast. 90 tablet 3  . polyethylene glycol powder (GLYCOLAX/MIRALAX)  powder Take 1 capful in 10 oz of fluid 2 times daily until you are having daily soft stools 250 g 0  . QUEtiapine (SEROQUEL) 100 MG tablet Take 1 tablet (100 mg total) by mouth 2 (two) times daily. 180 tablet 1  . spironolactone (ALDACTONE) 25 MG tablet TAKE 1 TABLET EVERY DAY 90 tablet 3   No facility-administered medications prior to visit.     ROS: Review of Systems  Constitutional: Negative for activity change, appetite change, chills, fatigue and unexpected weight change.  HENT: Negative for congestion, mouth sores and sinus pressure.   Eyes: Negative for visual disturbance.  Respiratory: Negative for cough and chest tightness.   Gastrointestinal: Negative for abdominal pain and nausea.  Genitourinary: Negative for difficulty urinating, frequency and vaginal pain.  Musculoskeletal: Positive for gait problem. Negative for back pain.  Skin: Negative for pallor and rash.  Neurological: Negative for dizziness, tremors, weakness, numbness and headaches.  Psychiatric/Behavioral: Positive for behavioral problems, confusion, decreased concentration and dysphoric mood. Negative for sleep disturbance and suicidal ideas. The patient is nervous/anxious.     Objective:  BP 122/76 (BP Location: Left Arm, Patient Position: Sitting, Cuff Size: Normal)   Pulse 73   Temp 98.2 F (36.8 C) (Oral)   Ht 5\' 6"  (1.676 m)  Wt 141 lb (64 kg)   SpO2 97%   BMI 22.76 kg/m   BP Readings from Last 3 Encounters:  07/09/19 122/76  03/24/19 120/74  09/03/18 126/82    Wt Readings from Last 3 Encounters:  07/09/19 141 lb (64 kg)  03/24/19 146 lb (66.2 kg)  09/03/18 157 lb (71.2 kg)    Physical Exam Constitutional:      General: She is not in acute distress.    Appearance: She is well-developed. She is obese.  HENT:     Head: Normocephalic.     Right Ear: External ear normal.     Left Ear: External ear normal.     Nose: Nose normal.  Eyes:     General:        Right eye: No discharge.         Left eye: No discharge.     Conjunctiva/sclera: Conjunctivae normal.     Pupils: Pupils are equal, round, and reactive to light.  Neck:     Musculoskeletal: Normal range of motion and neck supple.     Thyroid: No thyromegaly.     Vascular: No JVD.     Trachea: No tracheal deviation.  Cardiovascular:     Rate and Rhythm: Normal rate and regular rhythm.     Heart sounds: Normal heart sounds.  Pulmonary:     Effort: No respiratory distress.     Breath sounds: No stridor. No wheezing.  Abdominal:     General: Bowel sounds are normal. There is no distension.     Palpations: Abdomen is soft. There is no mass.     Tenderness: There is no abdominal tenderness. There is no guarding or rebound.  Musculoskeletal:        General: No tenderness.  Lymphadenopathy:     Cervical: No cervical adenopathy.  Skin:    Findings: No erythema or rash.  Neurological:     Mental Status: She is disoriented.     Cranial Nerves: No cranial nerve deficit.     Motor: No abnormal muscle tone.     Coordination: Coordination abnormal.     Deep Tendon Reflexes: Reflexes normal.  Psychiatric:        Behavior: Behavior normal.        Thought Content: Thought content normal.        Judgment: Judgment normal.     Lab Results  Component Value Date   WBC 4.9 06/05/2018   HGB 12.5 06/05/2018   HCT 37.2 06/05/2018   PLT 296.0 06/05/2018   GLUCOSE 97 03/24/2019   CHOL 129 10/27/2014   TRIG 82 10/27/2014   HDL 45 10/27/2014   LDLCALC 68 10/27/2014   ALT 8 05/17/2016   AST 14 05/17/2016   NA 146 (H) 03/24/2019   K 4.7 03/24/2019   CL 108 03/24/2019   CREATININE 1.04 03/24/2019   BUN 21 03/24/2019   CO2 28 03/24/2019   TSH 4.305 10/28/2014   INR 1.12 02/05/2015   HGBA1C 5.9 03/24/2019   MICROALBUR <0.7 05/17/2016    Mm 3d Screen Breast Bilateral  Result Date: 11/01/2018 CLINICAL DATA:  Screening. Please note, the patient could not tolerate optimal positioning; the best possible images were obtained.  EXAM: DIGITAL SCREENING BILATERAL MAMMOGRAM WITH TOMO AND CAD COMPARISON:  Previous exam(s). ACR Breast Density Category a: The breast tissue is almost entirely fatty. FINDINGS: There are no findings suspicious for malignancy. Images were processed with CAD. IMPRESSION: No mammographic evidence of malignancy. A result letter of this  screening mammogram will be mailed directly to the patient. RECOMMENDATION: Screening mammogram in one year. (Code:SM-B-01Y) BI-RADS CATEGORY  1: Negative. Electronically Signed   By: Sande BrothersSerena  Chacko M.D.   On: 11/01/2018 09:08    Assessment & Plan:   There are no diagnoses linked to this encounter.   No orders of the defined types were placed in this encounter.    Follow-up: No follow-ups on file.  Sonda PrimesAlex Plotnikov, MD

## 2019-07-28 ENCOUNTER — Emergency Department (HOSPITAL_COMMUNITY): Payer: HMO

## 2019-07-28 ENCOUNTER — Encounter (HOSPITAL_COMMUNITY): Payer: Self-pay

## 2019-07-28 ENCOUNTER — Inpatient Hospital Stay (HOSPITAL_COMMUNITY)
Admission: EM | Admit: 2019-07-28 | Discharge: 2019-08-09 | DRG: 177 | Disposition: A | Discharge status: Skilled Nursing Facility | Payer: HMO | Attending: Internal Medicine | Admitting: Internal Medicine

## 2019-07-28 ENCOUNTER — Ambulatory Visit: Payer: Self-pay | Admitting: *Deleted

## 2019-07-28 ENCOUNTER — Other Ambulatory Visit: Payer: Self-pay

## 2019-07-28 DIAGNOSIS — G9341 Metabolic encephalopathy: Secondary | ICD-10-CM | POA: Diagnosis present

## 2019-07-28 DIAGNOSIS — G2581 Restless legs syndrome: Secondary | ICD-10-CM | POA: Diagnosis present

## 2019-07-28 DIAGNOSIS — Z7984 Long term (current) use of oral hypoglycemic drugs: Secondary | ICD-10-CM | POA: Diagnosis not present

## 2019-07-28 DIAGNOSIS — R531 Weakness: Secondary | ICD-10-CM

## 2019-07-28 DIAGNOSIS — R1312 Dysphagia, oropharyngeal phase: Secondary | ICD-10-CM | POA: Diagnosis not present

## 2019-07-28 DIAGNOSIS — R442 Other hallucinations: Secondary | ICD-10-CM | POA: Diagnosis not present

## 2019-07-28 DIAGNOSIS — L89152 Pressure ulcer of sacral region, stage 2: Secondary | ICD-10-CM | POA: Diagnosis present

## 2019-07-28 DIAGNOSIS — F419 Anxiety disorder, unspecified: Secondary | ICD-10-CM | POA: Diagnosis present

## 2019-07-28 DIAGNOSIS — E876 Hypokalemia: Secondary | ICD-10-CM | POA: Diagnosis present

## 2019-07-28 DIAGNOSIS — S22080A Wedge compression fracture of T11-T12 vertebra, initial encounter for closed fracture: Secondary | ICD-10-CM

## 2019-07-28 DIAGNOSIS — E119 Type 2 diabetes mellitus without complications: Secondary | ICD-10-CM | POA: Diagnosis present

## 2019-07-28 DIAGNOSIS — K219 Gastro-esophageal reflux disease without esophagitis: Secondary | ICD-10-CM | POA: Diagnosis present

## 2019-07-28 DIAGNOSIS — Z79899 Other long term (current) drug therapy: Secondary | ICD-10-CM

## 2019-07-28 DIAGNOSIS — Z823 Family history of stroke: Secondary | ICD-10-CM

## 2019-07-28 DIAGNOSIS — Z8673 Personal history of transient ischemic attack (TIA), and cerebral infarction without residual deficits: Secondary | ICD-10-CM

## 2019-07-28 DIAGNOSIS — R627 Adult failure to thrive: Secondary | ICD-10-CM | POA: Diagnosis present

## 2019-07-28 DIAGNOSIS — U071 COVID-19: Secondary | ICD-10-CM | POA: Diagnosis present

## 2019-07-28 DIAGNOSIS — E1169 Type 2 diabetes mellitus with other specified complication: Secondary | ICD-10-CM | POA: Diagnosis not present

## 2019-07-28 DIAGNOSIS — E669 Obesity, unspecified: Secondary | ICD-10-CM | POA: Diagnosis not present

## 2019-07-28 DIAGNOSIS — Z7982 Long term (current) use of aspirin: Secondary | ICD-10-CM | POA: Diagnosis not present

## 2019-07-28 DIAGNOSIS — F0391 Unspecified dementia with behavioral disturbance: Secondary | ICD-10-CM | POA: Diagnosis present

## 2019-07-28 DIAGNOSIS — I959 Hypotension, unspecified: Secondary | ICD-10-CM | POA: Diagnosis not present

## 2019-07-28 DIAGNOSIS — R2689 Other abnormalities of gait and mobility: Secondary | ICD-10-CM | POA: Diagnosis not present

## 2019-07-28 DIAGNOSIS — L8990 Pressure ulcer of unspecified site, unspecified stage: Secondary | ICD-10-CM | POA: Insufficient documentation

## 2019-07-28 DIAGNOSIS — F329 Major depressive disorder, single episode, unspecified: Secondary | ICD-10-CM | POA: Diagnosis present

## 2019-07-28 DIAGNOSIS — J9601 Acute respiratory failure with hypoxia: Secondary | ICD-10-CM | POA: Diagnosis present

## 2019-07-28 DIAGNOSIS — E43 Unspecified severe protein-calorie malnutrition: Secondary | ICD-10-CM | POA: Diagnosis present

## 2019-07-28 DIAGNOSIS — R52 Pain, unspecified: Secondary | ICD-10-CM | POA: Diagnosis not present

## 2019-07-28 DIAGNOSIS — J1289 Other viral pneumonia: Secondary | ICD-10-CM | POA: Diagnosis present

## 2019-07-28 DIAGNOSIS — M6281 Muscle weakness (generalized): Secondary | ICD-10-CM | POA: Diagnosis not present

## 2019-07-28 DIAGNOSIS — Z7401 Bed confinement status: Secondary | ICD-10-CM | POA: Diagnosis not present

## 2019-07-28 DIAGNOSIS — Z6824 Body mass index (BMI) 24.0-24.9, adult: Secondary | ICD-10-CM

## 2019-07-28 DIAGNOSIS — R410 Disorientation, unspecified: Secondary | ICD-10-CM | POA: Diagnosis not present

## 2019-07-28 DIAGNOSIS — I10 Essential (primary) hypertension: Secondary | ICD-10-CM | POA: Diagnosis present

## 2019-07-28 DIAGNOSIS — M546 Pain in thoracic spine: Secondary | ICD-10-CM | POA: Diagnosis not present

## 2019-07-28 DIAGNOSIS — M2550 Pain in unspecified joint: Secondary | ICD-10-CM | POA: Diagnosis not present

## 2019-07-28 DIAGNOSIS — M545 Low back pain: Secondary | ICD-10-CM | POA: Diagnosis not present

## 2019-07-28 DIAGNOSIS — R41841 Cognitive communication deficit: Secondary | ICD-10-CM | POA: Diagnosis not present

## 2019-07-28 DIAGNOSIS — S299XXA Unspecified injury of thorax, initial encounter: Secondary | ICD-10-CM | POA: Diagnosis not present

## 2019-07-28 DIAGNOSIS — W19XXXA Unspecified fall, initial encounter: Secondary | ICD-10-CM | POA: Diagnosis not present

## 2019-07-28 DIAGNOSIS — R278 Other lack of coordination: Secondary | ICD-10-CM | POA: Diagnosis not present

## 2019-07-28 LAB — COMPREHENSIVE METABOLIC PANEL
ALT: 13 U/L (ref 0–44)
AST: 23 U/L (ref 15–41)
Albumin: 3.6 g/dL (ref 3.5–5.0)
Alkaline Phosphatase: 48 U/L (ref 38–126)
Anion gap: 12 (ref 5–15)
BUN: 13 mg/dL (ref 8–23)
CO2: 23 mmol/L (ref 22–32)
Calcium: 8.2 mg/dL — ABNORMAL LOW (ref 8.9–10.3)
Chloride: 103 mmol/L (ref 98–111)
Creatinine, Ser: 0.87 mg/dL (ref 0.44–1.00)
GFR calc Af Amer: >60 mL/min (ref >60)
GFR calc non Af Amer: >60 mL/min (ref >60)
Glucose, Bld: 92 mg/dL (ref 70–99)
Potassium: 3.2 mmol/L — ABNORMAL LOW (ref 3.5–5.1)
Sodium: 138 mmol/L (ref 135–145)
Total Bilirubin: 0.7 mg/dL (ref 0.3–1.2)
Total Protein: 6.6 g/dL (ref 6.5–8.1)

## 2019-07-28 LAB — CBC WITH DIFFERENTIAL/PLATELET
Abs Immature Granulocytes: 0.02 10*3/uL (ref 0.00–0.07)
Basophils Absolute: 0 10*3/uL (ref 0.0–0.1)
Basophils Relative: 0 %
Eosinophils Absolute: 0 10*3/uL (ref 0.0–0.5)
Eosinophils Relative: 0 %
HCT: 39.6 % (ref 36.0–46.0)
Hemoglobin: 12.8 g/dL (ref 12.0–15.0)
Immature Granulocytes: 1 %
Lymphocytes Relative: 33 %
Lymphs Abs: 1.4 10*3/uL (ref 0.7–4.0)
MCH: 30.3 pg (ref 26.0–34.0)
MCHC: 32.3 g/dL (ref 30.0–36.0)
MCV: 93.8 fL (ref 80.0–100.0)
Monocytes Absolute: 0.3 10*3/uL (ref 0.1–1.0)
Monocytes Relative: 7 %
Neutro Abs: 2.4 10*3/uL (ref 1.7–7.7)
Neutrophils Relative %: 59 %
Platelets: 225 10*3/uL (ref 150–400)
RBC: 4.22 MIL/uL (ref 3.87–5.11)
RDW: 14 % (ref 11.5–15.5)
WBC: 4.1 10*3/uL (ref 4.0–10.5)
nRBC: 0 % (ref 0.0–0.2)

## 2019-07-28 LAB — URINALYSIS, ROUTINE W REFLEX MICROSCOPIC
Bacteria, UA: NONE SEEN
Bilirubin Urine: NEGATIVE
Glucose, UA: NEGATIVE mg/dL
Ketones, ur: 20 mg/dL — AB
Leukocytes,Ua: NEGATIVE
Nitrite: NEGATIVE
Protein, ur: 30 mg/dL — AB
Specific Gravity, Urine: 1.021 (ref 1.005–1.030)
pH: 5 (ref 5.0–8.0)

## 2019-07-28 LAB — GLUCOSE, CAPILLARY
Glucose-Capillary: 88 mg/dL (ref 70–99)
Glucose-Capillary: 80 mg/dL (ref 70–99)

## 2019-07-28 LAB — SARS CORONAVIRUS 2 (TAT 6-24 HRS): SARS Coronavirus 2: POSITIVE — AB

## 2019-07-28 LAB — CBG MONITORING, ED: Glucose-Capillary: 85 mg/dL (ref 70–99)

## 2019-07-28 MED ORDER — INSULIN ASPART 100 UNIT/ML ~~LOC~~ SOLN
0.0000 [IU] | Freq: Three times a day (TID) | SUBCUTANEOUS | Status: DC
  Administered 2019-08-04 (×2): 1 [IU] via SUBCUTANEOUS
  Administered 2019-08-06: 2 [IU] via SUBCUTANEOUS
  Administered 2019-08-06 – 2019-08-07 (×2): 1 [IU] via SUBCUTANEOUS
  Filled 2019-07-28: qty 0.09

## 2019-07-28 MED ORDER — POTASSIUM CHLORIDE CRYS ER 20 MEQ PO TBCR
40.0000 meq | EXTENDED_RELEASE_TABLET | Freq: Once | ORAL | Status: AC
  Administered 2019-07-28: 40 meq via ORAL
  Filled 2019-07-28: qty 2

## 2019-07-28 MED ORDER — VITAMIN D 25 MCG (1000 UNIT) PO TABS
1000.0000 [IU] | ORAL_TABLET | Freq: Every day | ORAL | Status: DC
  Administered 2019-07-29 – 2019-08-09 (×8): 1000 [IU] via ORAL
  Filled 2019-07-28 (×10): qty 1

## 2019-07-28 MED ORDER — ESCITALOPRAM OXALATE 10 MG PO TABS
10.0000 mg | ORAL_TABLET | Freq: Every day | ORAL | Status: DC
  Administered 2019-07-29 – 2019-08-09 (×10): 10 mg via ORAL
  Filled 2019-07-28 (×12): qty 1

## 2019-07-28 MED ORDER — ADULT MULTIVITAMIN W/MINERALS CH
1.0000 | ORAL_TABLET | Freq: Every day | ORAL | Status: DC
  Administered 2019-07-29 – 2019-08-09 (×8): 1 via ORAL
  Filled 2019-07-28 (×9): qty 1

## 2019-07-28 MED ORDER — DOXEPIN HCL 25 MG PO CAPS
25.0000 mg | ORAL_CAPSULE | Freq: Two times a day (BID) | ORAL | Status: DC
  Administered 2019-07-28 – 2019-08-09 (×15): 25 mg via ORAL
  Filled 2019-07-28 (×26): qty 1

## 2019-07-28 MED ORDER — SPIRONOLACTONE 25 MG PO TABS
25.0000 mg | ORAL_TABLET | Freq: Every day | ORAL | Status: DC
  Administered 2019-07-29 – 2019-08-06 (×6): 25 mg via ORAL
  Filled 2019-07-28 (×9): qty 1

## 2019-07-28 MED ORDER — CETAPHIL MOISTURIZING EX LOTN
1.0000 "application " | TOPICAL_LOTION | Freq: Two times a day (BID) | CUTANEOUS | Status: DC
  Administered 2019-07-28 – 2019-08-07 (×14): 1 via TOPICAL
  Filled 2019-07-28: qty 473
  Filled 2019-07-28 (×2): qty 118

## 2019-07-28 MED ORDER — FAMOTIDINE 20 MG PO TABS
40.0000 mg | ORAL_TABLET | Freq: Every day | ORAL | Status: DC
  Administered 2019-07-29 – 2019-08-09 (×9): 40 mg via ORAL
  Filled 2019-07-28 (×9): qty 2

## 2019-07-28 MED ORDER — ACETAMINOPHEN 325 MG PO TABS
650.0000 mg | ORAL_TABLET | Freq: Four times a day (QID) | ORAL | Status: DC | PRN
  Administered 2019-07-28 – 2019-08-08 (×4): 650 mg via ORAL
  Filled 2019-07-28 (×6): qty 2

## 2019-07-28 MED ORDER — MECLIZINE HCL 12.5 MG PO TABS
12.5000 mg | ORAL_TABLET | Freq: Every day | ORAL | Status: DC | PRN
  Filled 2019-07-28: qty 1

## 2019-07-28 MED ORDER — POLYETHYLENE GLYCOL 3350 17 GM/SCOOP PO POWD: 1.0000 | Freq: Every day | ORAL | Status: DC | PRN

## 2019-07-28 MED ORDER — DONEPEZIL HCL 10 MG PO TABS
10.0000 mg | ORAL_TABLET | Freq: Every day | ORAL | Status: DC
  Administered 2019-07-28 – 2019-08-08 (×5): 10 mg via ORAL
  Filled 2019-07-28 (×9): qty 1

## 2019-07-28 MED ORDER — POTASSIUM CHLORIDE 10 MEQ/100ML IV SOLN
10.0000 meq | Freq: Once | INTRAVENOUS | Status: AC
  Administered 2019-07-28: 10 meq via INTRAVENOUS
  Filled 2019-07-28: qty 100

## 2019-07-28 MED ORDER — PANTOPRAZOLE SODIUM 40 MG PO TBEC
40.0000 mg | DELAYED_RELEASE_TABLET | Freq: Every day | ORAL | Status: DC
  Administered 2019-07-28 – 2019-08-09 (×10): 40 mg via ORAL
  Filled 2019-07-28 (×10): qty 1

## 2019-07-28 MED ORDER — GABAPENTIN 100 MG PO CAPS: 200.0000 mg | ORAL_CAPSULE | Freq: Every day | ORAL | Status: DC

## 2019-07-28 MED ORDER — POLYETHYLENE GLYCOL 3350 17 G PO PACK: 17.0000 g | PACK | Freq: Every day | ORAL | Status: DC | PRN

## 2019-07-28 MED ORDER — POTASSIUM CHLORIDE IN NACL 40-0.9 MEQ/L-% IV SOLN
INTRAVENOUS | Status: DC
  Administered 2019-07-28: 100 mL/h via INTRAVENOUS
  Filled 2019-07-28 (×2): qty 1000

## 2019-07-28 MED ORDER — LUBIPROSTONE 24 MCG PO CAPS
24.0000 ug | ORAL_CAPSULE | Freq: Two times a day (BID) | ORAL | Status: DC | PRN
  Filled 2019-07-28: qty 1

## 2019-07-28 MED ORDER — BISACODYL 5 MG PO TBEC: 5.0000 mg | DELAYED_RELEASE_TABLET | Freq: Every day | ORAL | Status: DC | PRN

## 2019-07-28 MED ORDER — SODIUM CHLORIDE 0.9 % IV SOLN
Freq: Once | INTRAVENOUS | Status: AC
  Administered 2019-07-28: 17:00:00 via INTRAVENOUS

## 2019-07-28 MED ORDER — ENOXAPARIN SODIUM 40 MG/0.4ML ~~LOC~~ SOLN
40.0000 mg | SUBCUTANEOUS | Status: DC
  Administered 2019-07-28 – 2019-08-06 (×10): 40 mg via SUBCUTANEOUS
  Filled 2019-07-28 (×10): qty 0.4

## 2019-07-28 MED ORDER — SODIUM CHLORIDE 0.9 % IV BOLUS
1000.0000 mL | Freq: Once | INTRAVENOUS | Status: AC
  Administered 2019-07-28: 1000 mL via INTRAVENOUS

## 2019-07-28 MED ORDER — VITAMIN B-12 1000 MCG PO TABS
1000.0000 ug | ORAL_TABLET | Freq: Every day | ORAL | Status: DC
  Administered 2019-07-29 – 2019-08-09 (×8): 1000 ug via ORAL
  Filled 2019-07-28 (×10): qty 1

## 2019-07-28 MED ORDER — ASPIRIN EC 81 MG PO TBEC
81.0000 mg | DELAYED_RELEASE_TABLET | Freq: Every day | ORAL | Status: DC
  Administered 2019-07-28 – 2019-08-09 (×11): 81 mg via ORAL
  Filled 2019-07-28 (×12): qty 1

## 2019-07-28 NOTE — Progress Notes (Signed)
Patient admitted from the ED, daughter at the bedside and states pt has severe dementia at night. Patient attempting to swing and get OOB. Sitter ordered.

## 2019-07-28 NOTE — ED Triage Notes (Signed)
Per EMS: Pt from home. Pt has had weakness x2 days.  Pt usually uses walker/cane.  Pt unable to walk 2 days.  Pt hx of dementia and CVA affecting R side.  Pt has had decreased appetite x2 days.  pts initial bp 98/64 BP 110/64 P 77 97-100% RA CBG 138

## 2019-07-28 NOTE — Progress Notes (Signed)
CRITICAL VALUE ALERT  Critical Value:  COVID POSITIVE  Date & Time Notied:  [id number]  Provider Notified: YES  Orders Received/Actions taken: YES

## 2019-07-28 NOTE — H&P (Signed)
History and Physical    Tracy [name]:[id number] DOB: 11/30/1940 DOA: 07/28/2019  PCP: Tresa Garter, MD   Patient coming from: Home  I have personally briefly reviewed patient's old medical records in [facility]  Chief Complaint: Tracy [name]  HPI: Tracy [name] is a 78 y.o. female with medical history significant of strokes, hypertension, diabetes, depression, anxiety, RLS, dementia who presents with progressive weakness and inability for self-care.  Patient is accompanied by her daughter who provides much of the history.  The daughter reports that she is 1 of 6 siblings and they make decisions together and 3 of them are very involved with the patient's care.  She states the patient lives with her brother and their dad.  Per the daughter the patient has dementia and has been having a gradual yet progressive decline over the last year.  She states she has not had anything acute occur but in the last 2 weeks it seems to be more rapid decline where the patient is no longer ambulating independently, no longer eating well and just generally more fatigued.  She states that her brother had to lift her today because the patient cannot even sit up.  She furthermore has no appetite and has not eaten anything in the last 2 days.  The daughter does not think anything new has occurred but attributes all of her changes to her dementia. She states she has been more confused, they also feel she has been occasionally hallucinating at times. She states they knew the state would come but it seems to have worsened more recently.  They report she has not had any fevers, chills, other infectious symptoms.  No shortness of breath or cough, no diarrhea.  She has not had any new medications in the last month.  She had been taken off a mood stabilizer because she was having weight loss on it.  They have not noticed any new focal deficit.  She previously ambulated with a walker but inside the  house could get around without any DME.  She does not have a history of tobacco use, alcohol use or drug use  Review of Systems: As per HPI otherwise 10 point review of systems negative.    Past Medical History:  Diagnosis Date  . Acute cholecystitis 08/10/2013   Lap chole on 08/13/13   . Anxiety   . Depression   . Diabetes mellitus without complication ([name])   . Hypertension   . Restless leg syndrome   . Stroke [facility])     Past Surgical History:  Procedure Laterality Date  . CHOLECYSTECTOMY N/A 08/13/2013   Procedure: LAPAROSCOPIC CHOLECYSTECTOMY ;  Surgeon: Currie Paris, MD;  Location: [facility] OR;  Service: General;  Laterality: N/A;  . EP IMPLANTABLE DEVICE N/A 06/02/2015   Procedure: Loop Recorder Insertion;  Surgeon: Duke Salvia, MD;  Location: [facility] INVASIVE CV LAB;  Service: Cardiovascular;  Laterality: N/A;  . ERCP N/A 08/12/2013   Procedure: ENDOSCOPIC RETROGRADE CHOLANGIOPANCREATOGRAPHY (ERCP);  Surgeon: Theda Belfast, MD;  Location: [facility] ENDOSCOPY;  Service: Endoscopy;  Laterality: N/A;  . INCISIONAL HERNIA REPAIR N/A 08/20/2013   Procedure: HERNIA REPAIR INCISIONAL UMBILICAL;  Surgeon: Velora Heckler, MD;  Location: [facility] OR;  Service: General;  Laterality: N/A;  . SPHINCTEROTOMY  08/12/2013   Procedure: Dennison Mascot;  Surgeon: Theda Belfast, MD;  Location: [facility] ENDOSCOPY;  Service: Endoscopy;;     reports that she has never smoked. She has never used smokeless tobacco. She reports that  she does not drink alcohol or use drugs.  Allergies  Allergen Reactions  . Wellbutrin [Bupropion] Other (See Comments)    Wt loss  . Seroquel [Quetiapine Fumarate]     Seroquel is too strong at 1/2 - 1/4 - drooling      Family History  Problem Relation Age of Onset  . Stroke Mother      Prior to Admission medications   Medication Sig Start Date End Date Taking? Authorizing Provider  acetaminophen (TYLENOL) 325 MG tablet Take 650 mg by mouth every 6 (six) hours as needed for pain.     [provider]  aspirin EC 81 MG tablet Take 81 mg by mouth daily.    [provider]  bisacodyl (DULCOLAX) 5 MG EC tablet Take 1-2 tablets (5-10 mg total) by mouth daily as needed for moderate constipation. 05/21/18   Plotnikov, Georgina Quint, MD  cetaphil (CETAPHIL) lotion Apply 1 application topically 2 (two) times daily.    [provider]  cholecalciferol (VITAMIN D) 1000 UNITS tablet Take 1,000 Units by mouth daily.    [provider]  [facility] VITAMIN B12 1000 MCG tablet TAKE 1 TABLET EVERY DAY 06/16/19   Plotnikov, Georgina Quint, MD  donepezil (ARICEPT) 10 MG tablet TAKE 1 TABLET BY MOUTH EVERYDAY AT BEDTIME 05/12/19   Plotnikov, Georgina Quint, MD  doxepin (SINEQUAN) 25 MG capsule Take 1-2 capsules (25-50 mg total) by mouth 2 (two) times daily. 07/09/19   Plotnikov, Georgina Quint, MD  escitalopram (LEXAPRO) 10 MG tablet Take 1 tablet (10 mg total) by mouth daily. 11/20/18   Plotnikov, Georgina Quint, MD  esomeprazole (NEXIUM) 40 MG capsule TAKE 1 CAPSULE BY MOUTH EVERY DAY 05/26/19   Plotnikov, Georgina Quint, MD  famotidine (PEPCID) 40 MG tablet Take 1 tablet (40 mg total) by mouth daily. 03/24/19   Plotnikov, Georgina Quint, MD  gabapentin (NEURONTIN) 100 MG capsule TAKE 2 CAPSULES (200 MG TOTAL) BY MOUTH AT BEDTIME. 04/03/17   Judi Saa, DO  glucose blood test strip One touch Verio Flex.  Use as instructed to test sugars daily and prn.  E11.59 11/19/15   Pincus Sanes, MD  Ibuprofen-Famotidine 800-26.6 MG TABS Take 1 tablet by mouth 2 (two) times daily as needed (for pain).    [provider]  lubiprostone (AMITIZA) 24 MCG capsule Take 24 mcg by mouth 2 (two) times daily as needed for constipation.    [provider]  meclizine (ANTIVERT) 12.5 MG tablet Take 12.5 mg by mouth as needed for dizziness.    [provider]  metFORMIN (GLUCOPHAGE) 500 MG tablet Take 1 tablet (500 mg total) by mouth daily with breakfast. 03/24/19   Plotnikov, Georgina Quint, MD   polyethylene glycol powder (GLYCOLAX/MIRALAX) powder Take 1 capful in 10 oz of fluid 2 times daily until you are having daily soft stools 05/11/18   Arthor Captain, PA-C  spironolactone (ALDACTONE) 25 MG tablet TAKE 1 TABLET EVERY DAY 03/24/19   Plotnikov, Georgina Quint, MD    Physical Exam: Vitals:   07/28/19 1009 07/28/19 1010 07/28/19 1350 07/28/19 1553  BP:  111/79 123/76 117/69  Pulse:  68 67 84  Resp:  11 17 (!) 22  Temp:  98.7 F (37.1 C)    TempSrc:  Oral    SpO2:  96% 100% 100%  Weight: 65.8 kg     Height: 5\' 2"  (1.575 m)       Constitutional: NAD, calm, comfortable Vitals:   07/28/19 1009 07/28/19 1010 07/28/19  1350 07/28/19 1553  BP:  111/79 123/76 117/69  Pulse:  68 67 84  Resp:  11 17 (!) 22  Temp:  98.7 F (37.1 C)    TempSrc:  Oral    SpO2:  96% 100% 100%  Weight: 65.8 kg     Height: 5\' 2"  (1.575 m)      General: Pleasant, comfortable, answers to name and appropriately answers to most questions Eyes: PERRL, lids and conjunctivae normal ENMT: Dry mucous membrane. Posterior pharynx clear of any exudate or lesions. Neck: normal, supple, no masses, no thyromegaly Respiratory: clear to auscultation bilaterally, no wheezing, no crackles. Normal respiratory effort. No accessory muscle use.  Cardiovascular: Regular rate and rhythm, no murmurs / rubs / gallops. No extremity edema. 2+ pedal pulses. No carotid bruits.  Abdomen: no tenderness, no masses palpated. No hepatosplenomegaly. Bowel sounds positive.  Musculoskeletal: no clubbing / cyanosis. No joint deformity upper and lower extremities. Good ROM, no contractures. Normal muscle tone.  Skin: no rashes, lesions, ulcers. No induration Neurologic: CN 2-12 grossly intact.  Strength and sensation grossly intact, no focal deficits appreciated Psychiatric: Guarded judgment. Alert and oriented x1. Normal mood.    Labs on Admission: I have personally reviewed following labs and imaging studies  CBC: Recent Labs  Lab  07/28/19 1155  WBC 4.1  NEUTROABS 2.4  HGB 12.8  HCT 39.6  MCV 93.8  PLT 225   Basic Metabolic Panel: Recent Labs  Lab 07/28/19 1155  NA 138  K 3.2*  CL 103  CO2 23  GLUCOSE 92  BUN 13  CREATININE 0.87  CALCIUM 8.2*   GFR: Estimated Creatinine Clearance: 47.5 mL/min (by C-G formula based on SCr of 0.87 mg/dL). Liver Function Tests: Recent Labs  Lab 07/28/19 1155  AST 23  ALT 13  ALKPHOS 48  BILITOT 0.7  PROT 6.6  ALBUMIN 3.6   No results for input(s): LIPASE, AMYLASE in the last 168 hours. No results for input(s): AMMONIA in the last 168 hours. Coagulation Profile: No results for input(s): INR, PROTIME in the last 168 hours. Cardiac Enzymes: No results for input(s): CKTOTAL, CKMB, CKMBINDEX, TROPONINI in the last 168 hours. BNP (last 3 results) No results for input(s): PROBNP in the last 8760 hours. HbA1C: No results for input(s): HGBA1C in the last 72 hours. CBG: Recent Labs  Lab 07/28/19 1032  GLUCAP 85   Lipid Profile: No results for input(s): CHOL, HDL, LDLCALC, TRIG, CHOLHDL, LDLDIRECT in the last 72 hours. Thyroid Function Tests: No results for input(s): TSH, T4TOTAL, FREET4, T3FREE, THYROIDAB in the last 72 hours. Anemia Panel: No results for input(s): VITAMINB12, FOLATE, FERRITIN, TIBC, IRON, RETICCTPCT in the last 72 hours. Urine analysis:    Component Value Date/Time   COLORURINE YELLOW 07/28/2019 1100   APPEARANCEUR CLEAR 07/28/2019 1100   LABSPEC 1.021 07/28/2019 1100   PHURINE 5.0 07/28/2019 1100   GLUCOSEU NEGATIVE 07/28/2019 1100   GLUCOSEU NEGATIVE 05/17/2016 1201   HGBUR SMALL (A) 07/28/2019 1100   BILIRUBINUR NEGATIVE 07/28/2019 1100   KETONESUR 20 (A) 07/28/2019 1100   PROTEINUR 30 (A) 07/28/2019 1100   UROBILINOGEN 1.0 05/17/2016 1201   NITRITE NEGATIVE 07/28/2019 1100   LEUKOCYTESUR NEGATIVE 07/28/2019 1100    Radiological Exams on Admission: Dg Thoracic Spine 2 View  Result Date: 07/28/2019 CLINICAL DATA:  Fall with  midline tenderness, back pain, initial encounter. EXAM: THORACIC SPINE 2 VIEWS COMPARISON:  Two-view chest radiograph 04/25/2015. FINDINGS: Alignment is anatomic. Difficult to exclude very mild compression of the T12  superior endplate. Multilevel endplate degenerative changes. IMPRESSION: 1. Difficult to exclude very mild compression of the T12 superior endplate. 2. Multilevel degenerative disc disease. Electronically Signed   By: Lorin Picket M.D.   On: 07/28/2019 12:52   Dg Lumbar Spine Complete  Result Date: 07/28/2019 CLINICAL DATA:  78 year old female with fall back pain. EXAM: LUMBAR SPINE - COMPLETE 4+ VIEW COMPARISON:  Abdominal radiograph dated 05/11/2018. FINDINGS: There is no acute fracture or subluxation of the lumbar spine. There is osteopenia with multilevel degenerative changes. Grade 2 L4-L5 anterolisthesis. Degenerative changes at L5-S1 with endplate irregularity and sclerosis. Right upper quadrant cholecystectomy clips. IMPRESSION: 1. No acute fracture or subluxation of the lumbar spine. 2. Multilevel degenerative changes and grade 2 L4 L5 anterolisthesis. Electronically Signed   By: Anner Crete M.D.   On: 07/28/2019 12:52   Dg Chest Portable 1 View  Result Date: 07/28/2019 CLINICAL DATA:  Recent fall.  Generalized weakness EXAM: PORTABLE CHEST 1 VIEW COMPARISON:  December 15, 2015 FINDINGS: No edema or consolidation. Heart is upper normal in size with pulmonary vascularity normal. No adenopathy. Loop recorder noted on the left. There is degenerative change in each shoulder with superior migration of each humeral head. No pneumothorax. IMPRESSION: No edema or consolidation. Heart upper normal in size. Loop recorder on the left. Suspect chronic rotator cuff tear in each shoulder. Electronically Signed   By: Lowella Grip III M.D.   On: 07/28/2019 11:43    EKG: Independently reviewed.   Assessment/Plan Tracy [name] is a 78 y.o. female with medical history significant of  strokes, hypertension, diabetes, depression, anxiety, RLS, dementia who presents with progressive weakness and inability for self-care which is most likely secondary to progression of patient's dementia.  #Advanced dementia with behavioral agitation #Depression and anxiety -Patient now with diminished p.o. intake, worsening confusion and occasional hallucinations in the absence of new focal deficits, new medication changes, acute infectious symptoms or signs.  Only metabolic abnormality is hypokalemia. -On exam patient does not have remarkable findings, no focal deficits -Her UA is not consistent with infection.  Her metabolic panel shows hypokalemia which we will replete but not profound enough to explain symptoms nor the duration of symptoms -At this time I did not appreciate any focal deficits to obtain an MRI, however if with working with PT/OT something is delineated it would be reasonable to obtain -Continue IV fluids and replete potassium -Continue home meds of donepezil, doxepin, escitalopram -Nutrition consult, PT consult, OT consult -Would monitor for 24 hours but can consider addition of mirtazapine to her regimen especially given patient's insomnia and poor appetite -Ultimately, family aware the patient will probably need higher level of care and they are try to figure this out immediately given she has had multiple children supporting her closely and they no longer feel they can adequately support her needs  #History of stroke -Continue aspirin, she has not been on statin  #GERD -Continue PPI and H2 blocker  #Type 2 diabetes -Held Metformin, will order low-dose ISS and Accu-Cheks -Continue gabapentin  DVT prophylaxis: Lovenox Code Status: Full-daughter wants to talk with the rest of the siblings before making a decision Family Communication: Multiple children and daughter will converse with the remainder of the family Disposition Plan: Pending, but will likely need higher  level care Admission status: Observation   Truddie Hidden MD [facility] Pager [phone]  If 7PM-7AM, please contact night-coverage [email or web address] Password [facility]  07/28/2019, 4:54 PM

## 2019-07-28 NOTE — Progress Notes (Addendum)
Notified that family notes that the patient is no longer taking gabapentin.  Discontinued off MAR.  Patient also was noted to be COVID-19 positive and orders placed to transfer to the fourth floor.

## 2019-07-28 NOTE — Telephone Encounter (Signed)
Pt's son Juanda Crumble called stating the pt is having weakness and not eating since 07/24/2019;; he says the pt is walking slower; the pt has alzheimer and can not speak for herself; she has not eaten or drank "ardly anything", and her urine is dark; he also states that he "had to pick her up and carry her to the car yesterday because she was so weak"; recommendations made per nurse triage protocol; he verbalized understanding; the pt sees Dr Alain Marion, [facility]; will route to office for notification.  Reason for Disposition . [1] Drinking very little AND [2] dehydration suspected (e.g., no urine > 12 hours, very dry mouth, very lightheaded)  Answer Assessment - Initial Assessment Questions 1. DESCRIPTION: "Describe how you are feeling."     Pt can not verbalize due to Alzheimer 2. SEVERITY: "How bad is it?"  "Can you stand and walk?"   - MILD - Feels weak or tired, but does not interfere with work, school or normal activities   - Gary to stand and walk; weakness interferes with work, school, or normal activities   - SEVERE - Unable to stand or walk    severe 3. ONSET:  "When did the weakness begin?"     07/24/2019 4. CAUSE: "What do you think is causing the weakness?"    Not sure 5. MEDICINES: "Have you recently started a new medicine or had a change in the amount of a medicine?"     Pt's family stopped giving her dementia medication because it made her drowsy 6. OTHER SYMPTOMS: "Do you have any other symptoms?" (e.g., chest pain, fever, cough, SOB, vomiting, diarrhea, bleeding, other areas of pain)    no 7. PREGNANCY: "Is there any chance you are pregnant?" "When was your last menstrual period?"   no  Protocols used: WEAKNESS (GENERALIZED) AND FATIGUE-A-AH

## 2019-07-28 NOTE — ED Provider Notes (Signed)
[facility] DEPT Provider Note   CSN: [id number] Arrival date & time: 07/28/19  9449     History   Chief Complaint No chief complaint on file.   HPI Tracy [name] is a 78 y.o. female.     HPI   Level 5 caveat due to dementia.  Tracy [name] is a 78 y.o. female, with a history of dementia DM, HTN, multiple strokes, presenting to the ED via EMS from home with generalized weakness increasing over the last 2 weeks.  For the last week she has not been able to walk and has had poor food and fluid intake. Over the last 2 days, she has had several falls, usually simply sliding to the floor off the sofa or bed. Nausea and intermittent loose stools.  Denies fever/chills, chest pain, shortness of breath, cough, abdominal pain, urinary symptoms, syncope, or any other complaints.  Past Medical History:  Diagnosis Date   Acute cholecystitis 08/10/2013   Lap chole on 08/13/13    Anxiety    Depression    Diabetes mellitus without complication ([place])    Hypertension    Restless leg syndrome    Stroke [facility])     Patient Active Problem List   Diagnosis Date Noted   Hematochezia 07/31/2017   Well adult exam 06/20/2017   Abdominal pain 02/26/2017   Insomnia 02/26/2017   Cerumen impaction 12/04/2016   Cervical disc disorder with radiculopathy of cervical region 09/22/2016   Left rotator cuff tear arthropathy 09/22/2016   Left shoulder pain 09/01/2016   Left wrist pain 09/01/2016   GERD (gastroesophageal reflux disease) 04/25/2016   Onychomycosis 03/06/2016   Viral illness 12/21/2015   Blood in stool 11/30/2015   Tension headache 10/08/2015   Subdural hematoma ([place]) 02/05/2015   Palpitations 01/18/2015   HLD (hyperlipidemia) 01/12/2015   Stroke ([place]) 01/12/2015   Anemia, iron deficiency 12/24/2014   Generalized anxiety disorder 11/27/2014   Abscess of right buttock 11/04/2014   Diabetes mellitus type 2 in obese  ([place])    SIRS (systemic inflammatory response syndrome) ([place]) 67/59/1638   Umbilical hernia 46/65/9935   Dementia with aggressive behavior ([place]) 02/03/2014   Loss of weight 10/06/2013   Constipation 10/06/2013   HTN (hypertension) 08/10/2013   History of CVA (cerebrovascular accident) 08/10/2013    Past Surgical History:  Procedure Laterality Date   CHOLECYSTECTOMY N/A 08/13/2013   Procedure: LAPAROSCOPIC CHOLECYSTECTOMY ;  Surgeon: Haywood Lasso, MD;  Location: [facility];  Service: General;  Laterality: N/A;   EP IMPLANTABLE DEVICE N/A 06/02/2015   Procedure: Loop Recorder Insertion;  Surgeon: Deboraha Sprang, MD;  Location: [facility] CV LAB;  Service: Cardiovascular;  Laterality: N/A;   ERCP N/A 08/12/2013   Procedure: ENDOSCOPIC RETROGRADE CHOLANGIOPANCREATOGRAPHY (ERCP);  Surgeon: Beryle Beams, MD;  Location: [facility] ENDOSCOPY;  Service: Endoscopy;  Laterality: N/A;   INCISIONAL HERNIA REPAIR N/A 08/20/2013   Procedure: HERNIA REPAIR INCISIONAL UMBILICAL;  Surgeon: Earnstine Regal, MD;  Location: [facility];  Service: General;  Laterality: N/A;   SPHINCTEROTOMY  08/12/2013   Procedure: Joan Mayans;  Surgeon: Beryle Beams, MD;  Location: [facility] ENDOSCOPY;  Service: Endoscopy;;     OB History   No obstetric history on file.      Home Medications    Prior to Admission medications   Medication Sig Start Date End Date Taking? Authorizing Provider  acetaminophen (TYLENOL) 325 MG tablet Take 650 mg by mouth every 6 (six) hours as needed for pain.  [provider]  aspirin EC 81 MG tablet Take 81 mg by mouth daily.    [provider]  bisacodyl (DULCOLAX) 5 MG EC tablet Take 1-2 tablets (5-10 mg total) by mouth daily as needed for moderate constipation. 05/21/18   Plotnikov, Georgina Quint, MD  cetaphil (CETAPHIL) lotion Apply 1 application topically 2 (two) times daily.    [provider]  cholecalciferol (VITAMIN D) 1000 UNITS tablet Take 1,000 Units by  mouth daily.    [provider]  [employer] VITAMIN B12 1000 MCG tablet TAKE 1 TABLET EVERY DAY 06/16/19   Plotnikov, Georgina Quint, MD  donepezil (ARICEPT) 10 MG tablet TAKE 1 TABLET BY MOUTH EVERYDAY AT BEDTIME 05/12/19   Plotnikov, Georgina Quint, MD  doxepin (SINEQUAN) 25 MG capsule Take 1-2 capsules (25-50 mg total) by mouth 2 (two) times daily. 07/09/19   Plotnikov, Georgina Quint, MD  escitalopram (LEXAPRO) 10 MG tablet Take 1 tablet (10 mg total) by mouth daily. 11/20/18   Plotnikov, Georgina Quint, MD  esomeprazole (NEXIUM) 40 MG capsule TAKE 1 CAPSULE BY MOUTH EVERY DAY 05/26/19   Plotnikov, Georgina Quint, MD  famotidine (PEPCID) 40 MG tablet Take 1 tablet (40 mg total) by mouth daily. 03/24/19   Plotnikov, Georgina Quint, MD  gabapentin (NEURONTIN) 100 MG capsule TAKE 2 CAPSULES (200 MG TOTAL) BY MOUTH AT BEDTIME. 04/03/17   Judi Saa, DO  glucose blood test strip One touch Verio Flex.  Use as instructed to test sugars daily and prn.  E11.59 11/19/15   Pincus Sanes, MD  Ibuprofen-Famotidine 800-26.6 MG TABS Take 1 tablet by mouth 2 (two) times daily as needed (for pain).    [provider]  lubiprostone (AMITIZA) 24 MCG capsule Take 24 mcg by mouth 2 (two) times daily as needed for constipation.    [provider]  meclizine (ANTIVERT) 12.5 MG tablet Take 12.5 mg by mouth as needed for dizziness.    [provider]  metFORMIN (GLUCOPHAGE) 500 MG tablet Take 1 tablet (500 mg total) by mouth daily with breakfast. 03/24/19   Plotnikov, Georgina Quint, MD  polyethylene glycol powder (GLYCOLAX/MIRALAX) powder Take 1 capful in 10 oz of fluid 2 times daily until you are having daily soft stools 05/11/18   Arthor Captain, PA-C  spironolactone (ALDACTONE) 25 MG tablet TAKE 1 TABLET EVERY DAY 03/24/19   Plotnikov, Georgina Quint, MD    Family History Family History  Problem Relation Age of Onset   Stroke Mother     Social History Social History   Tobacco Use   Smoking status: Never Smoker    Smokeless tobacco: Never Used  Substance Use Topics   Alcohol use: No   Drug use: No     Allergies   Wellbutrin [bupropion] and Seroquel [quetiapine fumarate]   Review of Systems Review of Systems  Constitutional: Positive for fatigue. Negative for chills, diaphoresis and fever.  Respiratory: Negative for cough and shortness of breath.   Cardiovascular: Negative for chest pain.  Gastrointestinal: Positive for diarrhea and nausea. Negative for abdominal pain, blood in stool and vomiting.  Genitourinary: Negative for dysuria and hematuria.  Skin: Negative for rash.  Neurological: Positive for weakness. Negative for syncope.  All other systems reviewed and are negative.    Physical Exam Updated Vital Signs BP 111/79    Pulse 68    Temp 98.7 F (37.1 C) (Oral)    Resp 11    Ht  (1.575 m)    Wt 65.8 kg  SpO2 96%    BMI 26.52 kg/m   Physical Exam Vitals signs and nursing note reviewed.  Constitutional:      General: She is not in acute distress.    Appearance: She is well-developed. She is not diaphoretic.  HENT:     Head: Normocephalic and atraumatic.     Mouth/Throat:     Mouth: Mucous membranes are moist.     Pharynx: Oropharynx is clear.  Eyes:     Conjunctiva/sclera: Conjunctivae normal.  Neck:     Musculoskeletal: Neck supple.  Cardiovascular:     Rate and Rhythm: Normal rate and regular rhythm.     Pulses: Normal pulses.          Radial pulses are 2+ on the right side and 2+ on the left side.       Posterior tibial pulses are 2+ on the right side and 2+ on the left side.     Heart sounds: Normal heart sounds.     Comments: Tactile temperature in the extremities appropriate and equal bilaterally. Pulmonary:     Effort: Pulmonary effort is normal. No respiratory distress.     Breath sounds: Normal breath sounds.  Abdominal:     Palpations: Abdomen is soft.     Tenderness: There is no abdominal tenderness. There is no guarding.  Musculoskeletal:      Right lower leg: No edema.     Left lower leg: No edema.  Lymphadenopathy:     Cervical: No cervical adenopathy.  Skin:    General: Skin is warm and dry.  Neurological:     Mental Status: She is alert.     Comments: Patient overall pleasantly confused, which patient's daughter states is consistent with her baseline. She has motor functioning in each of her extremities, though she does have evidence of overall muscular weakness without focal deficit.  Psychiatric:        Mood and Affect: Mood and affect normal.        Speech: Speech normal.        Behavior: Behavior normal.      ED Treatments / Results  Labs (all labs ordered are listed, but only abnormal results are displayed) Labs Reviewed  COMPREHENSIVE METABOLIC PANEL - Abnormal; Notable for the following components:      Result Value   Potassium 3.2 (*)    Calcium 8.2 (*)    All other components within normal limits  URINALYSIS, ROUTINE W REFLEX MICROSCOPIC - Abnormal; Notable for the following components:   Hgb urine dipstick SMALL (*)    Ketones, ur 20 (*)    Protein, ur 30 (*)    All other components within normal limits  URINE CULTURE  SARS CORONAVIRUS 2 (TAT 6-24 HRS)  CBC WITH DIFFERENTIAL/PLATELET  CBG MONITORING, ED    EKG EKG Interpretation  Date/Time:  Monday July 28 2019 10:26:34 EST Ventricular Rate:  69 PR Interval:    QRS Duration: 86 QT Interval:  390 QTC Calculation: 418 R Axis:   42 Text Interpretation: Sinus rhythm Low voltage, precordial leads When compared to prior, slower rate. No STEMI Confirmed by Theda Belfast ([id number]) on 07/28/2019 10:31:55 AM   Radiology Dg Thoracic Spine 2 View  Result Date: 07/28/2019 CLINICAL DATA:  Fall with midline tenderness, back pain, initial encounter. EXAM: THORACIC SPINE 2 VIEWS COMPARISON:  Two-view chest radiograph 04/25/2015. FINDINGS: Alignment is anatomic. Difficult to exclude very mild compression of the T12 superior endplate. Multilevel  endplate degenerative changes. IMPRESSION: 1. Difficult to  exclude very mild compression of the T12 superior endplate. 2. Multilevel degenerative disc disease. Electronically Signed   By: Leanna Battles M.D.   On: 07/28/2019 12:52   Dg Lumbar Spine Complete  Result Date: 07/28/2019 CLINICAL DATA:  78 year old female with fall back pain. EXAM: LUMBAR SPINE - COMPLETE 4+ VIEW COMPARISON:  Abdominal radiograph dated 05/11/2018. FINDINGS: There is no acute fracture or subluxation of the lumbar spine. There is osteopenia with multilevel degenerative changes. Grade 2 L4-L5 anterolisthesis. Degenerative changes at L5-S1 with endplate irregularity and sclerosis. Right upper quadrant cholecystectomy clips. IMPRESSION: 1. No acute fracture or subluxation of the lumbar spine. 2. Multilevel degenerative changes and grade 2 L4 L5 anterolisthesis. Electronically Signed   By: Elgie Collard M.D.   On: 07/28/2019 12:52   Dg Chest Portable 1 View  Result Date: 07/28/2019 CLINICAL DATA:  Recent fall.  Generalized weakness EXAM: PORTABLE CHEST 1 VIEW COMPARISON:  December 15, 2015 FINDINGS: No edema or consolidation. Heart is upper normal in size with pulmonary vascularity normal. No adenopathy. Loop recorder noted on the left. There is degenerative change in each shoulder with superior migration of each humeral head. No pneumothorax. IMPRESSION: No edema or consolidation. Heart upper normal in size. Loop recorder on the left. Suspect chronic rotator cuff tear in each shoulder. Electronically Signed   By: Bretta Bang III M.D.   On: 07/28/2019 11:43    Procedures Procedures (including critical care time)  Medications Ordered in ED Medications  potassium chloride SA (KLOR-CON) CR tablet 40 mEq (has no administration in time range)  0.9 %  sodium chloride infusion (has no administration in time range)  sodium chloride 0.9 % bolus 1,000 mL (0 mLs Intravenous Stopped 07/28/19 1452)  potassium chloride 10 mEq in  100 mL IVPB (0 mEq Intravenous Stopped 07/28/19 1451)     Initial Impression / Assessment and Plan / ED Course  I have reviewed the triage vital signs and the nursing notes.  Pertinent labs & imaging results that were available during my care of the patient were reviewed by me and considered in my medical decision making (see chart for details).  Clinical Course as of Jul 27 1624  Mon Jul 28, 2019  1426 Spoke with hospitalist.    [SJ]    Clinical Course User Index [SJ] Anselm Pancoast, [place]       Patient presents with generalized weakness with progressive decline over the last couple weeks. Patient is nontoxic appearing, afebrile, not tachycardic, not tachypneic, not hypotensive, maintains excellent SPO2 on room air, and is in no apparent distress.  Decline due to dementia is a consideration here. She does have ketonuria noted on her UA, raising suspicion for dehydration, consistent with her recent history of poor oral intake. Mild hypokalemia.  Possible T12 compression fracture. Observation admission and then plan to evaluate for possibility of placement.  Findings and plan of care discussed with Theda Belfast, MD. Dr. Rush Landmark personally evaluated and examined this patient.  Vitals:   07/28/19 1009 07/28/19 1010 07/28/19 1350  BP:  111/79 123/76  Pulse:  68 67  Resp:  11 17  Temp:  98.7 F (37.1 C)   TempSrc:  Oral   SpO2:  96% 100%  Weight: 65.8 kg    Height: 5\' 2"  (1.575 m)        Final Clinical Impressions(s) / ED Diagnoses   Final diagnoses:  Generalized weakness  Compression fracture of T12 vertebra, initial encounter [facility])    ED Discharge Orders  None       Concepcion LivingJoy, Giulliana Mcroberts C, PA-C 07/28/19 1628    Tegeler, Canary Brimhristopher J, MD 07/29/19 [phone]

## 2019-07-28 NOTE — Telephone Encounter (Signed)
Pt at ED.

## 2019-07-28 NOTE — Progress Notes (Signed)
Writer received notification that pt COVID positive. Attending and AC notified. Order received for pt transfer. Daughter Sunday Spillers notified of pt's pending transfer

## 2019-07-29 ENCOUNTER — Other Ambulatory Visit: Payer: Self-pay

## 2019-07-29 DIAGNOSIS — R531 Weakness: Secondary | ICD-10-CM | POA: Diagnosis not present

## 2019-07-29 LAB — BASIC METABOLIC PANEL
Anion gap: 9 (ref 5–15)
BUN: 9 mg/dL (ref 8–23)
CO2: 22 mmol/L (ref 22–32)
Calcium: 7.7 mg/dL — ABNORMAL LOW (ref 8.9–10.3)
Chloride: 108 mmol/L (ref 98–111)
Creatinine, Ser: 0.85 mg/dL (ref 0.44–1.00)
GFR calc Af Amer: >60 mL/min (ref >60)
GFR calc non Af Amer: >60 mL/min (ref >60)
Glucose, Bld: 82 mg/dL (ref 70–99)
Potassium: 3.8 mmol/L (ref 3.5–5.1)
Sodium: 139 mmol/L (ref 135–145)

## 2019-07-29 LAB — GLUCOSE, CAPILLARY
Glucose-Capillary: 69 mg/dL — ABNORMAL LOW (ref 70–99)
Glucose-Capillary: 80 mg/dL (ref 70–99)
Glucose-Capillary: 89 mg/dL (ref 70–99)
Glucose-Capillary: 74 mg/dL (ref 70–99)
Glucose-Capillary: 88 mg/dL (ref 70–99)

## 2019-07-29 LAB — URINE CULTURE: Culture: NO GROWTH

## 2019-07-29 LAB — CBC
HCT: 35.5 % — ABNORMAL LOW (ref 36.0–46.0)
Hemoglobin: 11.5 g/dL — ABNORMAL LOW (ref 12.0–15.0)
MCH: 30 pg (ref 26.0–34.0)
MCHC: 32.4 g/dL (ref 30.0–36.0)
MCV: 92.7 fL (ref 80.0–100.0)
Platelets: 198 10*3/uL (ref 150–400)
RBC: 3.83 MIL/uL — ABNORMAL LOW (ref 3.87–5.11)
RDW: 14 % (ref 11.5–15.5)
WBC: 3.6 10*3/uL — ABNORMAL LOW (ref 4.0–10.5)
nRBC: 0 % (ref 0.0–0.2)

## 2019-07-29 LAB — D-DIMER, QUANTITATIVE: D-Dimer, Quant: 0.99 ug/mL-FEU — ABNORMAL HIGH (ref 0.00–0.50)

## 2019-07-29 LAB — C-REACTIVE PROTEIN: CRP: 3.3 mg/dL — ABNORMAL HIGH (ref <1.0)

## 2019-07-29 MED ORDER — VITAMIN C 500 MG PO TABS
500.0000 mg | ORAL_TABLET | Freq: Two times a day (BID) | ORAL | Status: DC
  Administered 2019-07-29 – 2019-08-09 (×13): 500 mg via ORAL
  Filled 2019-07-29 (×18): qty 1

## 2019-07-29 MED ORDER — ACETAMINOPHEN 650 MG RE SUPP
650.0000 mg | RECTAL | Status: DC | PRN
  Administered 2019-07-29 – 2019-08-02 (×4): 650 mg via RECTAL
  Filled 2019-07-29 (×6): qty 1

## 2019-07-29 MED ORDER — ACETAMINOPHEN 325 MG PO TABS
650.0000 mg | ORAL_TABLET | ORAL | Status: AC
  Administered 2019-07-29: 650 mg via ORAL

## 2019-07-29 MED ORDER — ENSURE ENLIVE PO LIQD
237.0000 mL | Freq: Three times a day (TID) | ORAL | Status: DC
  Administered 2019-07-29 – 2019-08-08 (×13): 237 mL via ORAL
  Filled 2019-07-29 (×4): qty 237

## 2019-07-29 MED ORDER — ZINC SULFATE 220 (50 ZN) MG PO CAPS
220.0000 mg | ORAL_CAPSULE | Freq: Every day | ORAL | Status: DC
  Administered 2019-07-29 – 2019-08-09 (×8): 220 mg via ORAL
  Filled 2019-07-29 (×9): qty 1

## 2019-07-29 NOTE — Evaluation (Signed)
Physical Therapy Evaluation Patient Details Name: Tracy [name] MRN: [id number] DOB: 10-10-1940 Today's Date: 07/29/2019   History of Present Illness  Pt was admitted with weakness and found to have COVID.  PMH:  ? mild compression fx at T12, CVA, DM, HTN and dementia  Clinical Impression  Pt admitted with above diagnosis.  Pt currently with functional limitations due to the deficits listed below (see PT Problem List). Pt will benefit from skilled PT to increase their independence and safety with mobility to allow discharge to the venue listed below.  Pt assisted to sitting EOB and unable to maintain trunk upright without assist.  Pt difficult to understand most of the time, very soft spoken.  Pt did reports being cold however not following commands or assisting with mobility.  Pt poor historian and per chart, from home with spouse and son.  If family is unable to assist pt then, Recommend SNF at this time.     Follow Up Recommendations SNF    Equipment Recommendations  Other (comment)(defer to next venue, pt poor historian)    Recommendations for Other Services       Precautions / Restrictions Precautions Precautions: Fall Restrictions Weight Bearing Restrictions: No      Mobility  Bed Mobility Overal bed mobility: Needs Assistance Bed Mobility: Rolling;Sidelying to Sit;Sit to Supine Rolling: Total assist Sidelying to sit: Max assist;+2 for physical assistance   Sit to supine: Total assist;+2 for physical assistance   General bed mobility comments: pt assisted wtih trunk to sit up; she lifted LLE back onto bed twice during bed mob  Transfers                 General transfer comment: not attempted  Ambulation/Gait                Stairs            Wheelchair Mobility    Modified Rankin (Stroke Patients Only)       Balance Overall balance assessment: Needs assistance Sitting-balance support: Bilateral upper extremity supported;Feet  unsupported Sitting balance-Leahy Scale: Poor Sitting balance - Comments: min to max A to maintain sitting.                                       Pertinent Vitals/Pain Pain Assessment: Faces Faces Pain Scale: Hurts a little bit Pain Location: pt only states being cold Pain Descriptors / Indicators: Grimacing Pain Intervention(s): Repositioned    Home Living Family/patient expects to be discharged to:: Unsure                 Additional Comments: per chart, lives with husband and son    Prior Function           Comments: per chart, week h/o decreased ambulation and eating.  Unsure of how much assistance she had before     Hand Dominance        Extremity/Trunk Assessment   Upper Extremity Assessment Upper Extremity Assessment: Difficult to assess due to impaired cognition    Lower Extremity Assessment Lower Extremity Assessment: Generalized weakness;Difficult to assess due to impaired cognition       Communication   Communication: (difficult to understand)  Cognition Arousal/Alertness: (responsive but closed eyes) Behavior During Therapy: Flat affect Overall Cognitive Status: No family/caregiver present to determine baseline cognitive functioning  General Comments: did not really follow commands; did verbalize but couldn't understand her      General Comments      Exercises     Assessment/Plan    PT Assessment Patient needs continued PT services  PT Problem List Decreased strength;Decreased activity tolerance;Decreased balance;Decreased knowledge of use of DME;Decreased cognition;Decreased mobility       PT Treatment Interventions DME instruction;Therapeutic activities;Gait training;Therapeutic exercise;Functional mobility training;Patient/family education;Balance training    PT Goals (Current goals can be found in the Care Plan section)  Acute Rehab PT Goals Patient Stated Goal:  unable PT Goal Formulation: Patient unable to participate in goal setting Time For Goal Achievement: 08/19/19 Potential to Achieve Goals: Good    Frequency Min 2X/week   Barriers to discharge        Co-evaluation PT/OT/SLP Co-Evaluation/Treatment: Yes Reason for Co-Treatment: Necessary to address cognition/behavior during functional activity;For patient/therapist safety PT goals addressed during session: Mobility/safety with mobility OT goals addressed during session: ADL's and self-care       AM-PAC PT "6 Clicks" Mobility  Outcome Measure Help needed turning from your back to your side while in a flat bed without using bedrails?: A Lot Help needed moving from lying on your back to sitting on the side of a flat bed without using bedrails?: A Lot Help needed moving to and from a bed to a chair (including a wheelchair)?: Total Help needed standing up from a chair using your arms (e.g., wheelchair or bedside chair)?: Total Help needed to walk in hospital room?: Total Help needed climbing 3-5 steps with a railing? : Total 6 Click Score: 8    End of Session   Activity Tolerance: Patient tolerated treatment well Patient left: with call bell/phone within reach;in bed;with bed alarm set Nurse Communication: Mobility status PT Visit Diagnosis: Difficulty in walking, not elsewhere classified (R26.2);Muscle weakness (generalized) (M62.81)    Time: 1540-0867 PT Time Calculation (min) (ACUTE ONLY): 18 min   Charges:   PT Evaluation $PT Eval Low Complexity: [place], PT, DPT Acute Rehabilitation Services Office: [phone] Pager: [phone]  Trena Platt 07/29/2019, 4:41 PM

## 2019-07-29 NOTE — Plan of Care (Signed)
  Problem: Education: Goal: Knowledge of General Education information will improve Description: Including pain rating scale, medication(s)/side effects and non-pharmacologic comfort measures Outcome: Progressing   Problem: Clinical Measurements: Goal: Ability to maintain clinical measurements within normal limits will improve Outcome: Progressing Goal: Will remain free from infection Outcome: Progressing Goal: Diagnostic test results will improve Outcome: Progressing Goal: Respiratory complications will improve Outcome: Progressing Goal: Cardiovascular complication will be avoided Outcome: Progressing   Problem: Activity: Goal: Risk for activity intolerance will decrease Outcome: Progressing   Problem: Nutrition: Goal: Adequate nutrition will be maintained Outcome: Not Progressing Note: Decreased appetite   Problem: Elimination: Goal: Will not experience complications related to urinary retention Outcome: Progressing   Problem: Pain Managment: Goal: General experience of comfort will improve Outcome: Progressing   Problem: Safety: Goal: Ability to remain free from injury will improve Outcome: Progressing   Problem: Skin Integrity: Goal: Risk for impaired skin integrity will decrease Outcome: Progressing

## 2019-07-29 NOTE — Progress Notes (Signed)
Pt's daughter, Sunday Spillers, updated via telephone on plan of care. Verbalized understanding.

## 2019-07-29 NOTE — Progress Notes (Signed)
PROGRESS NOTE    Tracy [name]  DQQ:[id number] DOB: August 10, 1941 DOA: 07/28/2019 PCP: Cassandria Anger, MD   Brief Narrative:  Patient is a 78 year old female with history of advanced dementia, stroke, hypertension, diabetes, depression, anxiety, restless leg syndrome who presented from home with progressive weakness, poor appetite, inability to ambulate.  Patient lives with her family.  As per her family, she has progressively declined over last year.  But since last few days, she is more fatigued, has poor appetite.  Family did not report any fever, cough or any respiratory symptoms.  She was hemodynamically stable on presentation.  Covid test came out to be positive.  Chest x-ray did not show any pneumonia.  She saturating fine on room air.  We are transferring her to [facility] today.  PT/OT evaluation pending.  Assessment & Plan:   Active Problems:   Weakness   Covid-19: She was asymptomatic on presentation but had fever of 102 F early this morning but currently afebrile this morning.  Hemodynamically stable.  Saturating fine on room air.  X-ray does not show any pneumonia. Continue supportive care.  Tylenol for fever.  Continue zinc and vitamin C.  Continue daily labs.  No need of steroids or remedesivir.  Will be transferred to [facility].  Fatigue, poor appetite, difficulty ambulation: Most likely the symptoms are due to her Covid infection.  But as per family patient has been progressively declining.  Continue supportive care.  PT/OT evaluation pending.  She might need skilled nursing facility on discharge but could be difficult due to Covid status.  Advanced dementia: Currently at baseline.  Not oriented, confused.  Not agitated.  Continue donepezil.  Continue supportive care  History of stroke: Continue aspirin.She is usually ambulatory when she is at her baseline.  GERD: Continue PPI  Diabetes type 2: Continue sliding scale insulin for now.  On  metformin at home.  Anxiety/depression: Continue current medicines.          DVT prophylaxis: Lovenox Code Status: Full Family Communication: Called daughter on phone this morning. Disposition Plan: Skilled nursing facility versus home health PT. family not sure about skilled nursing facility disposition and they will discuss and decide if PT recommends that.   Consultants: None  Procedures:None  Antimicrobials:  Anti-infectives (From admission, onward)   None      Subjective:  Patient seen and examined at bedside this morning.  Hemodynamically stable.  Afebrile during my evaluation today.  Saturating fine on room air.  Very comfortable, not in any kind of respiratory distress.  Denies any complaints.  Confused.  Objective: Vitals:   07/28/19 2322 07/29/19 0105 07/29/19 0300 07/29/19 0648  BP:    107/62  Pulse:    77  Resp: 18   17  Temp:  (!) 102 F (38.9 C) 99.4 F (37.4 C) 98.3 F (36.8 C)  TempSrc:  Oral Oral   SpO2:    98%  Weight:      Height:        Intake/Output Summary (Last 24 hours) at 07/29/2019 1941 Last data filed at 07/29/2019 0400 Gross per 24 hour  Intake 1620.03 ml  Output -  Net 1620.03 ml   Filed Weights   07/28/19 1009  Weight: 65.8 kg    Examination:  General exam: Appears calm and comfortable ,Not in distress,elderly female, pleasantly confused  HEENT: Ear/Nose normal on gross exam Respiratory system: Bilateral equal air entry, normal vesicular breath sounds, no wheezes or crackles  Cardiovascular  system: S1 & S2 heard, RRR.  No pedal edema. Gastrointestinal system: Abdomen is nondistended, soft and nontender.  Central nervous system: Alert and awake but not oriented.  No obvious focal neurological deficits Extremities: No edema, no clubbing ,no cyanosis Skin: No rashes, lesions or ulcers,no icterus ,no pallor   Data Reviewed: I have personally reviewed following labs and imaging studies  CBC: Recent Labs  Lab 07/28/19  1155 07/29/19 0506  WBC 4.1 3.6*  NEUTROABS 2.4  --   HGB 12.8 11.5*  HCT 39.6 35.5*  MCV 93.8 92.7  PLT 225 198   Basic Metabolic Panel: Recent Labs  Lab 07/28/19 1155 07/29/19 0506  NA 138 139  K 3.2* 3.8  CL 103 108  CO2 23 22  GLUCOSE 92 82  BUN 13 9  CREATININE 0.87 0.85  CALCIUM 8.2* 7.7*   GFR: Estimated Creatinine Clearance: 48.6 mL/min (by C-G formula based on SCr of 0.85 mg/dL). Liver Function Tests: Recent Labs  Lab 07/28/19 1155  AST 23  ALT 13  ALKPHOS 48  BILITOT 0.7  PROT 6.6  ALBUMIN 3.6   No results for input(s): LIPASE, AMYLASE in the last 168 hours. No results for input(s): AMMONIA in the last 168 hours. Coagulation Profile: No results for input(s): INR, PROTIME in the last 168 hours. Cardiac Enzymes: No results for input(s): CKTOTAL, CKMB, CKMBINDEX, TROPONINI in the last 168 hours. BNP (last 3 results) No results for input(s): PROBNP in the last 8760 hours. HbA1C: No results for input(s): HGBA1C in the last 72 hours. CBG: Recent Labs  Lab 07/28/19 1032 07/28/19 2014 07/28/19 2244  GLUCAP 85 88 80   Lipid Profile: No results for input(s): CHOL, HDL, LDLCALC, TRIG, CHOLHDL, LDLDIRECT in the last 72 hours. Thyroid Function Tests: No results for input(s): TSH, T4TOTAL, FREET4, T3FREE, THYROIDAB in the last 72 hours. Anemia Panel: No results for input(s): VITAMINB12, FOLATE, FERRITIN, TIBC, IRON, RETICCTPCT in the last 72 hours. Sepsis Labs: No results for input(s): PROCALCITON, LATICACIDVEN in the last 168 hours.  Recent Results (from the past 240 hour(s))  SARS CORONAVIRUS 2 (TAT 6-24 HRS) Nasopharyngeal Nasopharyngeal Swab     Status: Abnormal   Collection Time: 07/28/19 12:46 PM   Specimen: Nasopharyngeal Swab  Result Value Ref Range Status   SARS Coronavirus 2 POSITIVE (A) NEGATIVE Final    Comment: RESULT CALLED TO, READ BACK BY AND VERIFIED WITH: V.JACKSON RN 2026 07/28/2019 MCCORMICK K (NOTE) SARS-CoV-2 target nucleic  acids are DETECTED. The SARS-CoV-2 RNA is generally detectable in upper and lower respiratory specimens during the acute phase of infection. Positive results are indicative of active infection with SARS-CoV-2. Clinical  correlation with patient history and other diagnostic information is necessary to determine patient infection status. Positive results do  not rule out bacterial infection or co-infection with other viruses. The expected result is Negative. Fact Sheet for Patients: HairSlick.nohttps://www.fda.gov/media/138098/download Fact Sheet for Healthcare Providers: quierodirigir.comhttps://www.fda.gov/media/138095/download This test is not yet approved or cleared by the [place] FDA and  has been authorized for detection and/or diagnosis of SARS-CoV-2 by FDA under an Emergency Use Authorization (EUA). This EUA will remain  in effect (meaning this test can be used)  for the duration of the COVID-19 declaration under Section 564(b)(1) of the Act, 21 U.S.C. section 360bbb-3(b)(1), unless the authorization is terminated or revoked sooner. Performed at [facility], [address] [place], [place] [address]          Radiology Studies: Dg Thoracic Spine 2 View  Result Date: 07/28/2019 CLINICAL DATA:  Fall with midline tenderness, back pain, initial encounter. EXAM: THORACIC SPINE 2 VIEWS COMPARISON:  Two-view chest radiograph 04/25/2015. FINDINGS: Alignment is anatomic. Difficult to exclude very mild compression of the T12 superior endplate. Multilevel endplate degenerative changes. IMPRESSION: 1. Difficult to exclude very mild compression of the T12 superior endplate. 2. Multilevel degenerative disc disease. Electronically Signed   By: Leanna Battles M.D.   On: 07/28/2019 12:52   Dg Lumbar Spine Complete  Result Date: 07/28/2019 CLINICAL DATA:  78 year old female with fall back pain. EXAM: LUMBAR SPINE - COMPLETE 4+ VIEW COMPARISON:  Abdominal radiograph dated 05/11/2018. FINDINGS: There is no  acute fracture or subluxation of the lumbar spine. There is osteopenia with multilevel degenerative changes. Grade 2 L4-L5 anterolisthesis. Degenerative changes at L5-S1 with endplate irregularity and sclerosis. Right upper quadrant cholecystectomy clips. IMPRESSION: 1. No acute fracture or subluxation of the lumbar spine. 2. Multilevel degenerative changes and grade 2 L4 L5 anterolisthesis. Electronically Signed   By: Elgie Collard M.D.   On: 07/28/2019 12:52   Dg Chest Portable 1 View  Result Date: 07/28/2019 CLINICAL DATA:  Recent fall.  Generalized weakness EXAM: PORTABLE CHEST 1 VIEW COMPARISON:  December 15, 2015 FINDINGS: No edema or consolidation. Heart is upper normal in size with pulmonary vascularity normal. No adenopathy. Loop recorder noted on the left. There is degenerative change in each shoulder with superior migration of each humeral head. No pneumothorax. IMPRESSION: No edema or consolidation. Heart upper normal in size. Loop recorder on the left. Suspect chronic rotator cuff tear in each shoulder. Electronically Signed   By: Bretta Bang III M.D.   On: 07/28/2019 11:43        Scheduled Meds: . aspirin EC  81 mg Oral Daily  . cetaphil  1 application Topical BID  . cholecalciferol  1,000 Units Oral Daily  . donepezil  10 mg Oral QHS  . doxepin  25 mg Oral BID  . enoxaparin (LOVENOX) injection  40 mg Subcutaneous Q24H  . escitalopram  10 mg Oral Daily  . famotidine  40 mg Oral Daily  . insulin aspart  0-9 Units Subcutaneous TID WC  . multivitamin with minerals  1 tablet Oral Daily  . pantoprazole  40 mg Oral Daily  . spironolactone  25 mg Oral Daily  . cyanocobalamin  1,000 mcg Oral Daily   Continuous Infusions:   LOS: 0 days    Time spent: 35 mins.More than 50% of that time was spent in counseling and/or coordination of care.      Burnadette Pop, MD [facility] Pager [phone]  If 7PM-7AM, please contact night-coverage [email or web address] Password  [facility] 07/29/2019, 8:28 AM

## 2019-07-29 NOTE — Patient Outreach (Signed)
  [facility]) Care Management Chronic Special Needs Program    07/29/2019  Name: Tracy [name], DOB: [birth year]  MRN: [id number]   [name] is enrolled in a chronic special needs plan for Diabetes. Admitted to [facility] with weakness Individualized care plan sent to [facility] for admission  [facility] will continue to follow   Peter Garter RN, [facility] [phone]

## 2019-07-29 NOTE — Progress Notes (Signed)
Initial Nutrition Assessment  DOCUMENTATION CODES:   Not applicable  INTERVENTION:   - Continue MVI with minerals daily  - Obtain measured admission weight  - Ensure Enlive po TID, each supplement provides 350 kcal and 20 grams of protein  - Agree with Regular diet order  NUTRITION DIAGNOSIS:   Increased nutrient needs related to acute illness (COVID-19) as evidenced by estimated needs.  GOAL:   Patient will meet greater than or equal to 90% of their needs  MONITOR:   PO intake, Supplement acceptance, Labs, Weight trends  REASON FOR ASSESSMENT:   Consult Poor PO  ASSESSMENT:   78 year old female who presented to the ED on 11/16 with weakness and poor PO intake. PMH of HTN, T2DM, multiple strokes, dementia, GERD, anxiety, depression. Pt was found to be COVID-19 positive.   Noted plan for pt to transfer to [facility] to reach pt via phone call at this time.  Per H&P, pt with poor appetite for several days PTA.  Weight of 145 lbs on admission appears to be stated rather than measured. Recommend obtaining measured weight.  Reviewed weight history in chart. Pt with a 5.4 kg weight loss over the last 11 months. This is a 7.6% weight loss which is not significant for timeframe but is concerning given weight loss seems to have started even prior to December 2019.  Pt is at risk for malnutrition and may already meet criteria. However, RD unable to confirm without NFPE and detailed diet history.  Given reported poor PO intake and weight loss, RD will order oral nutrition supplements to aid pt in meeting kcal and protein needs.  Medications reviewed and include: cholecalciferol, Pepcid, SSI, MVI with minerals, Protonix, spironolactone, vitamin B-12, vitamin C 500 mg BID, zinc sulfate 220 mg daily  Labs reviewed. CBG's: 69-88 x 24 hours  NUTRITION - FOCUSED PHYSICAL EXAM:  Unable to complete at this time.  Diet Order:   Diet Order            Diet  regular Room service appropriate? Yes; Fluid consistency: Thin  Diet effective now              EDUCATION NEEDS:   No education needs have been identified at this time  Skin:  Skin Assessment: Reviewed RN Assessment  Last BM:  no documented BM  Height:   Ht Readings from Last 1 Encounters:  07/28/19 5\' 2"  (1.575 m)    Weight:   Wt Readings from Last 1 Encounters:  07/28/19 65.8 kg    Ideal Body Weight:  50 kg  BMI:  Body mass index is 26.52 kg/m.  Estimated Nutritional Needs:   Kcal:  1450-1650  Protein:  70-85 grams  Fluid:  1.4-1.6 L    Gaynell Face, MS, RD, LDN Inpatient Clinical Dietitian Pager: [phone] Weekend/After Hours: [phone]

## 2019-07-29 NOTE — Progress Notes (Signed)
Hypoglycemic Event  CBG: 69  Treatment: 4 oz juice/soda  Symptoms: None  Follow-up CBG: Time: 6950 CBG Result: 80  Possible Reasons for Event: Inadequate meal intake  Comments/MD notified: Dr. Lolita Rieger, Francetta Found

## 2019-07-29 NOTE — Plan of Care (Signed)
°  Problem: Clinical Measurements: °Goal: Ability to maintain clinical measurements within normal limits will improve °Outcome: Progressing °  °Problem: Nutrition: °Goal: Adequate nutrition will be maintained °Outcome: Progressing °  °Problem: Coping: °Goal: Level of anxiety will decrease °Outcome: Progressing °  °Problem: Pain Managment: °Goal: General experience of comfort will improve °Outcome: Progressing °  °

## 2019-07-29 NOTE — Progress Notes (Signed)
Pt transferring to [facility] today per Dr. Tawanna Solo. Pt's IV site patent and WDL. Pt's VSS. Report called to [address], receiving RN. Daughter, Sunday Spillers, called and made aware of new bed assignment. Will update daughter when Karen Chafe arrives for transport.

## 2019-07-29 NOTE — Progress Notes (Signed)
Noted that PT's T is 102.0 No acute distress. Hospitalist notified via [place].

## 2019-07-29 NOTE — Evaluation (Signed)
Occupational Therapy Evaluation Patient Details Name: Tracy [name] MRN: [id number] DOB: 1940/12/12 Today's Date: 07/29/2019    History of Present Illness Pt was admitted with weakness and found to have COVID.  PMH:  ? mild compression fx at T12, CVA, DM, HTN and dementia   Clinical Impression   Pt was admitted for the above.  Per chart, she was ambulatory and fed herself. Unsure if she performed any other ADLs.  Pt did not follow commands during eval, and she did not engage in ADL activity even with HOH assist.  She did sit up and helped a little with this. She needed min to max A for trunk support sitting. Will follow in acute setting with the goals listed below.    Follow Up Recommendations  SNF    Equipment Recommendations  (tba further)    Recommendations for Other Services       Precautions / Restrictions Precautions Precautions: Fall Restrictions Weight Bearing Restrictions: No      Mobility Bed Mobility Overal bed mobility: Needs Assistance Bed Mobility: Rolling;Sidelying to Sit;Sit to Supine Rolling: Total assist Sidelying to sit: Max assist;+2 for physical assistance   Sit to supine: Total assist;+2 for physical assistance   General bed mobility comments: pt assisted wtih trunk to sit up; she lifted LLE back onto bed twice during bed mob  Transfers                 General transfer comment: not attempted    Balance Overall balance assessment: Needs assistance   Sitting balance-Leahy Scale: Poor Sitting balance - Comments: min to max A to maintain sitting.                                     ADL either performed or assessed with clinical judgement   ADL Overall ADL's : Needs assistance/impaired          Feeding:  Would not take a sip of beverage Grooming:  Total A to wash face                             General ADL Comments: total A for all ADLs; +2 to roll.  Pt did sit EOB with min to max A for support      Vision         Perception     Praxis      Pertinent Vitals/Pain Pain Assessment: Faces Faces Pain Scale: Hurts a little bit     Hand Dominance     Extremity/Trunk Assessment Upper Extremity Assessment Upper Extremity Assessment: Difficult to assess due to impaired cognition           Communication Communication Communication: (difficult to understand)   Cognition Arousal/Alertness: (responsive but closed eyes) Behavior During Therapy: Flat affect Overall Cognitive Status: No family/caregiver present to determine baseline cognitive functioning                                 General Comments: did not really follow commands; did verbalize but couldn't understand her   General Comments       Exercises     Shoulder Instructions      Home Living Family/patient expects to be discharged to:: Unsure  Additional Comments: per chart, lives with husband and son      Prior Functioning/Environment          Comments: per chart, week h/o decreased ambulation and eating.  Unsure of how much assistance she had before        OT Problem List: Decreased strength;Decreased activity tolerance;Impaired balance (sitting and/or standing);Decreased cognition;Impaired UE functional use      OT Treatment/Interventions: Self-care/ADL training;Therapeutic exercise;Therapeutic activities;Patient/family education;Balance training    OT Goals(Current goals can be found in the care plan section) Acute Rehab OT Goals Patient Stated Goal: unable OT Goal Formulation: Patient unable to participate in goal setting Time For Goal Achievement: 08/12/19 Potential to Achieve Goals: Fair ADL Goals Pt Will Perform Eating: with min assist;sitting Pt Will Transfer to Toilet: with mod assist;with +2 assist;bedside commode;stand pivot transfer Additional ADL Goal #1: pt will perform bed mobility and go from sit to stand with mod A,  +2 safety for adls  OT Frequency: Min 2X/week   Barriers to D/C:            Co-evaluation              AM-PAC OT "6 Clicks" Daily Activity     Outcome Measure Help from another person eating meals?: Total Help from another person taking care of personal grooming?: Total Help from another person toileting, which includes using toliet, bedpan, or urinal?: Total Help from another person bathing (including washing, rinsing, drying)?: Total Help from another person to put on and taking off regular upper body clothing?: Total Help from another person to put on and taking off regular lower body clothing?: Total 6 Click Score: 6   End of Session    Activity Tolerance: Patient tolerated treatment well Patient left: in bed;with call bell/phone within reach;with bed alarm set  OT Visit Diagnosis: Muscle weakness (generalized) (M62.81)                Time: 6213-0865 OT Time Calculation (min): 22 min Charges:  OT General Charges $OT Visit: 1 Visit OT Evaluation $OT Eval Low Complexity: [place], OTR/L Acute Rehabilitation Services [phone] [facility] pager [phone] office 07/29/2019  [place] 07/29/2019, 3:44 PM

## 2019-07-29 NOTE — Progress Notes (Addendum)
[facility] arrived to transport Pt to [facility]. Pt daughter Sunday Spillers has been notified. No questions att his time. Heart monitor removed. IV intact 20 G Lt A/C S/L. Ambulance package completed. Per nurse report has been called to [facility] report given to [facility]. Pt resting comfortably.

## 2019-07-30 DIAGNOSIS — Z7984 Long term (current) use of oral hypoglycemic drugs: Secondary | ICD-10-CM | POA: Diagnosis not present

## 2019-07-30 DIAGNOSIS — E119 Type 2 diabetes mellitus without complications: Secondary | ICD-10-CM | POA: Diagnosis present

## 2019-07-30 DIAGNOSIS — E1169 Type 2 diabetes mellitus with other specified complication: Secondary | ICD-10-CM | POA: Diagnosis not present

## 2019-07-30 DIAGNOSIS — E669 Obesity, unspecified: Secondary | ICD-10-CM | POA: Diagnosis not present

## 2019-07-30 DIAGNOSIS — J9601 Acute respiratory failure with hypoxia: Secondary | ICD-10-CM | POA: Diagnosis present

## 2019-07-30 DIAGNOSIS — G2581 Restless legs syndrome: Secondary | ICD-10-CM | POA: Diagnosis present

## 2019-07-30 DIAGNOSIS — I959 Hypotension, unspecified: Secondary | ICD-10-CM | POA: Diagnosis not present

## 2019-07-30 DIAGNOSIS — F419 Anxiety disorder, unspecified: Secondary | ICD-10-CM | POA: Diagnosis present

## 2019-07-30 DIAGNOSIS — Z8673 Personal history of transient ischemic attack (TIA), and cerebral infarction without residual deficits: Secondary | ICD-10-CM | POA: Diagnosis not present

## 2019-07-30 DIAGNOSIS — U071 COVID-19: Secondary | ICD-10-CM | POA: Diagnosis present

## 2019-07-30 DIAGNOSIS — Z79899 Other long term (current) drug therapy: Secondary | ICD-10-CM | POA: Diagnosis not present

## 2019-07-30 DIAGNOSIS — Z7982 Long term (current) use of aspirin: Secondary | ICD-10-CM | POA: Diagnosis not present

## 2019-07-30 DIAGNOSIS — Z6824 Body mass index (BMI) 24.0-24.9, adult: Secondary | ICD-10-CM | POA: Diagnosis not present

## 2019-07-30 DIAGNOSIS — K219 Gastro-esophageal reflux disease without esophagitis: Secondary | ICD-10-CM | POA: Diagnosis present

## 2019-07-30 DIAGNOSIS — F0391 Unspecified dementia with behavioral disturbance: Secondary | ICD-10-CM | POA: Diagnosis present

## 2019-07-30 DIAGNOSIS — G9341 Metabolic encephalopathy: Secondary | ICD-10-CM | POA: Diagnosis present

## 2019-07-30 DIAGNOSIS — R531 Weakness: Secondary | ICD-10-CM | POA: Diagnosis present

## 2019-07-30 DIAGNOSIS — F329 Major depressive disorder, single episode, unspecified: Secondary | ICD-10-CM | POA: Diagnosis present

## 2019-07-30 DIAGNOSIS — E43 Unspecified severe protein-calorie malnutrition: Secondary | ICD-10-CM | POA: Diagnosis present

## 2019-07-30 DIAGNOSIS — R627 Adult failure to thrive: Secondary | ICD-10-CM | POA: Diagnosis present

## 2019-07-30 DIAGNOSIS — I10 Essential (primary) hypertension: Secondary | ICD-10-CM | POA: Diagnosis present

## 2019-07-30 DIAGNOSIS — J1289 Other viral pneumonia: Secondary | ICD-10-CM | POA: Diagnosis present

## 2019-07-30 DIAGNOSIS — L89152 Pressure ulcer of sacral region, stage 2: Secondary | ICD-10-CM | POA: Diagnosis present

## 2019-07-30 DIAGNOSIS — E876 Hypokalemia: Secondary | ICD-10-CM | POA: Diagnosis present

## 2019-07-30 DIAGNOSIS — Z823 Family history of stroke: Secondary | ICD-10-CM | POA: Diagnosis not present

## 2019-07-30 LAB — LACTIC ACID, PLASMA
Lactic Acid, Venous: 1.4 mmol/L (ref 0.5–1.9)
Lactic Acid, Venous: 1.9 mmol/L (ref 0.5–1.9)

## 2019-07-30 LAB — GLUCOSE, CAPILLARY
Glucose-Capillary: 69 mg/dL — ABNORMAL LOW (ref 70–99)
Glucose-Capillary: 75 mg/dL (ref 70–99)
Glucose-Capillary: 84 mg/dL (ref 70–99)
Glucose-Capillary: 150 mg/dL — ABNORMAL HIGH (ref 70–99)

## 2019-07-30 LAB — PROCALCITONIN: Procalcitonin: 0.24 ng/mL

## 2019-07-30 MED ORDER — SODIUM CHLORIDE 0.9 % IV BOLUS
500.0000 mL | Freq: Once | INTRAVENOUS | Status: AC
  Administered 2019-07-30: 500 mL via INTRAVENOUS

## 2019-07-30 MED ORDER — DEXAMETHASONE SODIUM PHOSPHATE 10 MG/ML IJ SOLN
6.0000 mg | INTRAMUSCULAR | Status: DC
  Administered 2019-07-30 – 2019-08-04 (×5): 6 mg via INTRAVENOUS
  Filled 2019-07-30 (×7): qty 1

## 2019-07-30 MED ORDER — DEXTROSE-NACL 5-0.45 % IV SOLN
INTRAVENOUS | Status: DC
  Administered 2019-07-30 – 2019-08-01 (×2): via INTRAVENOUS

## 2019-07-30 NOTE — Progress Notes (Signed)
PROGRESS NOTE    Tracy [name]  BJY:[id number] DOB: [birth year] DOA: 07/28/2019 PCP: Cassandria Anger, MD   Brief Narrative:  Patient is a 78 year old female with history of advanced dementia, stroke, hypertension, diabetes, depression, anxiety, restless leg syndrome who presented from home with progressive weakness, poor appetite, inability to ambulate.  Patient lives with her family.  As per her family, she has progressively declined over last year.  But since last few days, she is more fatigued, has poor appetite.  Family did not report any fever, cough or any respiratory symptoms.  She was hemodynamically stable on presentation.  Covid test positive.  Chest x-ray did not show any pneumonia.  She saturating fine on room air.  We are transferring her to [facility] today.  PT/OT evaluation pending.  Assessment & Plan:   Principal Problem:   COVID-19 virus infection Active Problems:   Weakness  Covid-19, acute infection, without hypoxia:  -Continues to be febrile -Initiate steroids, hold off on remdesivir -Continues without hypoxia, hemodynamically stable. -X-ray personally reviewed, without overt infiltrate -Continue supportive care.  Tylenol for fever.  Continue zinc and vitamin C.  Continue daily labs.   Fatigue, poor appetite, difficulty ambulation, failure to thrive 2/2 above, POA -Most likely the symptoms are due to her Covid infection.  But as per family patient has been progressively declining.  Continue supportive care.  PT/OT evaluation pending.  She might need skilled nursing facility on discharge but could be difficult due to Covid status.  Advanced dementia: Currently at baseline.  Not oriented, confused.  Not agitated.  Continue donepezil.  Continue supportive care  History of stroke: Continue aspirin.She is usually ambulatory when she is at her baseline.  GERD: Continue PPI  Diabetes type 2: Continue sliding scale insulin for now.  On metformin at  home.  Anxiety/depression: Continue current medicines.  Nutrition Problem: Increased nutrient needs Etiology: acute illness(COVID-19)    DVT prophylaxis: Lovenox Code Status: Full Family Communication: Daughter updated on phone Disposition Plan: Skilled nursing facility versus home health PT. family not sure about skilled nursing facility disposition and they will discuss and decide if PT recommends that.  Consultants: None  Procedures:None  Antimicrobials:  Anti-infectives (From admission, onward)   None     Subjective: No acute issues or events overnight. Remains confused.  Objective: Vitals:   07/29/19 2230 07/30/19 0013 07/30/19 0500 07/30/19 0800  BP:  98/65 120/70 125/84  Pulse:    91  Resp:   18 16  Temp: 99.5 F (37.5 C) (!) 101.4 F (38.6 C) 99.4 F (37.4 C) (!) 100.8 F (38.2 C)  TempSrc: Oral Oral Oral Oral  SpO2:    100%  Weight:      Height:        Intake/Output Summary (Last 24 hours) at 07/30/2019 1457 Last data filed at 07/30/2019 0800 Gross per 24 hour  Intake 530 ml  Output 800 ml  Net -270 ml   Filed Weights   07/28/19 1009 07/29/19 2000  Weight: 65.8 kg 62.5 kg    Examination:  General exam: Appears calm and comfortable ,Not in distress,elderly female, pleasantly confused  HEENT: Ear/Nose normal on gross exam Respiratory system: Bilateral equal air entry, normal vesicular breath sounds, no wheezes or crackles  Cardiovascular system: S1 & S2 heard, RRR.  No pedal edema. Gastrointestinal system: Abdomen is nondistended, soft and nontender.  Central nervous system: Alert and awake but not oriented.  No obvious focal neurological deficits Extremities: No edema, no clubbing ,  no cyanosis Skin: No rashes, lesions or ulcers,no icterus ,no pallor   Data Reviewed: I have personally reviewed following labs and imaging studies  CBC: Recent Labs  Lab 07/28/19 1155 07/29/19 0506  WBC 4.1 3.6*  NEUTROABS 2.4  --   HGB 12.8 11.5*  HCT  39.6 35.5*  MCV 93.8 92.7  PLT 225 198   Basic Metabolic Panel: Recent Labs  Lab 07/28/19 1155 07/29/19 0506  NA 138 139  K 3.2* 3.8  CL 103 108  CO2 23 22  GLUCOSE 92 82  BUN 13 9  CREATININE 0.87 0.85  CALCIUM 8.2* 7.7*   GFR: Estimated Creatinine Clearance: 47.4 mL/min (by C-G formula based on SCr of 0.85 mg/dL). Liver Function Tests: Recent Labs  Lab 07/28/19 1155  AST 23  ALT 13  ALKPHOS 48  BILITOT 0.7  PROT 6.6  ALBUMIN 3.6   CBG: Recent Labs  Lab 07/29/19 1155 07/29/19 1754 07/29/19 2059 07/30/19 0934 07/30/19 1115  GLUCAP 89 74 88 69* 75   Sepsis Labs: Recent Labs  Lab 07/30/19 0410 07/30/19 0642  PROCALCITON 0.24  --   LATICACIDVEN 1.4 1.9    Recent Results (from the past 240 hour(s))  Urine culture     Status: None   Collection Time: 07/28/19 11:00 AM   Specimen: Urine, Clean Catch  Result Value Ref Range Status   Specimen Description   Final    URINE, CLEAN CATCH Performed at [facility], [address] [place], [place] [address]    Special Requests   Final    NONE Performed at [facility], [address], [place], [place] [address]    Culture   Final    NO GROWTH Performed at [facility], [address], [place], [place] [address]    Report Status 07/29/2019 FINAL  Final  SARS CORONAVIRUS 2 (TAT 6-24 HRS) Nasopharyngeal Nasopharyngeal Swab     Status: Abnormal   Collection Time: 07/28/19 12:46 PM   Specimen: Nasopharyngeal Swab  Result Value Ref Range Status   SARS Coronavirus 2 POSITIVE (A) NEGATIVE Final    Comment: RESULT CALLED TO, READ BACK BY AND VERIFIED WITH: V.JACKSON RN 2026 07/28/2019 MCCORMICK K (NOTE) SARS-CoV-2 target nucleic acids are DETECTED. The SARS-CoV-2 RNA is generally detectable in upper and lower respiratory specimens during the acute phase of infection. Positive results are indicative of active infection with SARS-CoV-2. Clinical  correlation with  patient history and other diagnostic information is necessary to determine patient infection status. Positive results do  not rule out bacterial infection or co-infection with other viruses. The expected result is Negative. Fact Sheet for Patients: HairSlick.no Fact Sheet for Healthcare Providers: quierodirigir.com This test is not yet approved or cleared by the [place] FDA and  has been authorized for detection and/or diagnosis of SARS-CoV-2 by FDA under an Emergency Use Authorization (EUA). This EUA will remain  in effect (meaning this test can be used)  for the duration of the COVID-19 declaration under Section 564(b)(1) of the Act, 21 U.S.C. section 360bbb-3(b)(1), unless the authorization is terminated or revoked sooner. Performed at [facility], [address], [place], [place] [address]     Radiology Studies: No results found.  Scheduled Meds:  aspirin EC  81 mg Oral Daily   cetaphil  1 application Topical BID   cholecalciferol  1,000 Units Oral Daily   donepezil  10 mg Oral QHS   doxepin  25 mg Oral BID  enoxaparin (LOVENOX) injection  40 mg Subcutaneous Q24H   escitalopram  10 mg Oral Daily   famotidine  40 mg Oral Daily   feeding supplement (ENSURE ENLIVE)  237 mL Oral TID BM   insulin aspart  0-9 Units Subcutaneous TID WC   multivitamin with minerals  1 tablet Oral Daily   pantoprazole  40 mg Oral Daily   spironolactone  25 mg Oral Daily   cyanocobalamin  1,000 mcg Oral Daily   vitamin C  500 mg Oral BID   zinc sulfate  220 mg Oral Daily   Continuous Infusions:  dextrose 5 % and 0.45% NaCl 30 mL/hr at 07/30/19 1124     LOS: 0 days   Time spent: 35 mins  Azucena FallenWilliam C Berenise Hunton, DO [facility] Pager [phone]  If 7PM-7AM, please contact night-coverage [email or web address] Password [email or web address] 07/30/2019, 2:57 PM

## 2019-07-30 NOTE — Plan of Care (Signed)
  Problem: Clinical Measurements: Goal: Will remain free from infection Outcome: Progressing   Problem: Nutrition: Goal: Adequate nutrition will be maintained Outcome: Not Progressing   Problem: Activity: Goal: Risk for activity intolerance will decrease Outcome: Not Progressing

## 2019-07-30 NOTE — Progress Notes (Signed)
This RN facetimed with daughter Sunday Spillers and patient, updated on pt status and plan of care. No further questions at this time.

## 2019-07-30 NOTE — Progress Notes (Signed)
Spoke with daughter Sunday Spillers and given update.  All questions and concerns answered.

## 2019-07-30 NOTE — Progress Notes (Signed)
   07/30/19 0500  Provider Notification  Provider Name/Title Dr Shanon Brow  Date Provider Notified 07/30/19  Time Provider Notified [phone]  Notification Reason Other (Comment) (urine retention)  pt with approx 184ml urine inc via purewick. Bladder scan reveals 268ml

## 2019-07-30 NOTE — NC FL2 (Signed)
[place] LEVEL OF CARE SCREENING TOOL     IDENTIFICATION  Patient Name: Tracy [name] Birthdate: 21-Sep-1940 Sex: female Admission Date (Current Location): 07/28/2019  [facility] and [place] Number:  Herbalist and Address:  [facility] [place], [place] [address], [place], [place] [address]([address]      Provider Number: [id number]  Attending Physician Name and Address:  Little Ishikawa, MD  Relative Name and Phone Number:  Daughter: Mayley Lish  [phone]    Current Level of Care: Hospital Recommended Level of Care: [place] Prior Approval Number:    Date Approved/Denied:   PASRR Number: [id number]  Discharge Plan: SNF    Current Diagnoses: Patient Active Problem List   Diagnosis Date Noted  . COVID-19 virus infection 07/30/2019  . Weakness 07/28/2019  . Hematochezia 07/31/2017  . Well adult exam 06/20/2017  . Abdominal pain 02/26/2017  . Insomnia 02/26/2017  . Cerumen impaction 12/04/2016  . Cervical disc disorder with radiculopathy of cervical region 09/22/2016  . Left rotator cuff tear arthropathy 09/22/2016  . Left shoulder pain 09/01/2016  . Left wrist pain 09/01/2016  . GERD (gastroesophageal reflux disease) 04/25/2016  . Onychomycosis 03/06/2016  . Viral illness 12/21/2015  . Blood in stool 11/30/2015  . Tension headache 10/08/2015  . Subdural hematoma ([place]) 02/05/2015  . Palpitations 01/18/2015  . HLD (hyperlipidemia) 01/12/2015  . Stroke ([place]) 01/12/2015  . Anemia, iron deficiency 12/24/2014  . Generalized anxiety disorder 11/27/2014  . Abscess of right buttock 11/04/2014  . Diabetes mellitus type 2 in obese ([place])   . SIRS (systemic inflammatory response syndrome) ([place]) 10/26/2014  . Umbilical hernia 95/28/4132  . Dementia with aggressive behavior ([place]) 02/03/2014  . Loss of weight 10/06/2013  . Constipation 10/06/2013  . HTN (hypertension) 08/10/2013  . History of  CVA (cerebrovascular accident) 08/10/2013    Orientation RESPIRATION BLADDER Height & Weight     Self  Normal Incontinent Weight: 62.5 kg Height:  5\' 2"  (157.5 cm)  BEHAVIORAL SYMPTOMS/MOOD NEUROLOGICAL BOWEL NUTRITION STATUS      Continent Diet  AMBULATORY STATUS COMMUNICATION OF NEEDS Skin   Extensive Assist Verbally Normal                       Personal Care Assistance Level of Assistance  Dressing, Bathing Bathing Assistance: Maximum assistance   Dressing Assistance: Maximum assistance     Functional Limitations Info             SPECIAL CARE FACTORS FREQUENCY  PT (By licensed PT), OT (By licensed OT)     PT Frequency: 5x/week OT Frequency: min3x/week            Contractures Contractures Info: Not present    Additional Factors Info  Code Status, Allergies Code Status Info: Full Allergies Info: Wellbutrin- weight loss, Seroquel-too strong           Current Medications (07/30/2019):  This is the current hospital active medication list Current Facility-Administered Medications  Medication Dose Route Frequency Provider Last Rate Last Dose  . acetaminophen (TYLENOL) suppository 650 mg  650 mg Rectal Q4H PRN Phillips Grout, MD   650 mg at 07/30/19 1015  . acetaminophen (TYLENOL) tablet 650 mg  650 mg Oral Q6H PRN Truddie Hidden, MD   650 mg at 07/28/19 2321  . aspirin EC tablet 81 mg  81 mg Oral Daily Truddie Hidden, MD   81 mg at 07/29/19 4401  .  bisacodyl (DULCOLAX) EC tablet 5-10 mg  5-10 mg Oral Daily PRN Clydia Llano, MD      . cetaphil lotion 1 application  1 application Topical BID Clydia Llano, MD   1 application at 07/29/19 2120  . cholecalciferol (VITAMIN D3) tablet 1,000 Units  1,000 Units Oral Daily Clydia Llano, MD   1,000 Units at 07/29/19 [phone]  . dexamethasone (DECADRON) injection 6 mg  6 mg Intravenous Q24H Azucena Fallen, MD   6 mg at 07/30/19 1543  . dextrose 5 %-0.45 % sodium chloride infusion   Intravenous  Continuous Azucena Fallen, MD 30 mL/hr at 07/30/19 1124    . donepezil (ARICEPT) tablet 10 mg  10 mg Oral QHS Clydia Llano, MD   10 mg at 07/28/19 2238  . doxepin (SINEQUAN) capsule 25 mg  25 mg Oral BID Clydia Llano, MD   25 mg at 07/29/19 3785  . enoxaparin (LOVENOX) injection 40 mg  40 mg Subcutaneous Q24H Clydia Llano, MD   40 mg at 07/29/19 2116  . escitalopram (LEXAPRO) tablet 10 mg  10 mg Oral Daily Clydia Llano, MD   10 mg at 07/29/19 0926  . famotidine (PEPCID) tablet 40 mg  40 mg Oral Daily Clydia Llano, MD   40 mg at 07/29/19 0925  . feeding supplement (ENSURE ENLIVE) (ENSURE ENLIVE) liquid 237 mL  237 mL Oral TID BM Adhikari, Amrit, MD   237 mL at 07/29/19 1231  . insulin aspart (novoLOG) injection 0-9 Units  0-9 Units Subcutaneous TID WC Clydia Llano, MD      . lubiprostone (AMITIZA) capsule 24 mcg  24 mcg Oral BID PRN Clydia Llano, MD      . meclizine (ANTIVERT) tablet 12.5 mg  12.5 mg Oral Daily PRN Clydia Llano, MD      . multivitamin with minerals tablet 1 tablet  1 tablet Oral Daily Clydia Llano, MD   1 tablet at 07/29/19 [phone]  . pantoprazole (PROTONIX) EC tablet 40 mg  40 mg Oral Daily Clydia Llano, MD   40 mg at 07/29/19 0926  . polyethylene glycol (MIRALAX / GLYCOLAX) packet 17 g  17 g Oral Daily PRN Cindi Carbon, RPH      . spironolactone (ALDACTONE) tablet 25 mg  25 mg Oral Daily Clydia Llano, MD   25 mg at 07/29/19 0925  . vitamin B-12 (CYANOCOBALAMIN) tablet 1,000 mcg  1,000 mcg Oral Daily Clydia Llano, MD   1,000 mcg at 07/29/19 0926  . vitamin C (ASCORBIC ACID) tablet 500 mg  500 mg Oral BID Burnadette Pop, MD   500 mg at 07/29/19 0925  . zinc sulfate capsule 220 mg  220 mg Oral Daily Burnadette Pop, MD   220 mg at 07/29/19 1231     Discharge Medications: Please see discharge summary for a list of discharge medications.  Relevant Imaging Results:  Relevant Lab Results:   Additional Information SSN#:  [id number]  Durenda Guthrie, RN

## 2019-07-30 NOTE — Plan of Care (Signed)
Pt still not eating continue to monitor blood glucose and encourage oral intake.

## 2019-07-31 ENCOUNTER — Other Ambulatory Visit: Payer: Self-pay | Admitting: Internal Medicine

## 2019-07-31 LAB — GLUCOSE, CAPILLARY
Glucose-Capillary: 106 mg/dL — ABNORMAL HIGH (ref 70–99)
Glucose-Capillary: 124 mg/dL — ABNORMAL HIGH (ref 70–99)
Glucose-Capillary: 112 mg/dL — ABNORMAL HIGH (ref 70–99)
Glucose-Capillary: 183 mg/dL — ABNORMAL HIGH (ref 70–99)

## 2019-07-31 LAB — COMPREHENSIVE METABOLIC PANEL
ALT: 14 U/L (ref 0–44)
AST: 23 U/L (ref 15–41)
Albumin: 3.2 g/dL — ABNORMAL LOW (ref 3.5–5.0)
Alkaline Phosphatase: 53 U/L (ref 38–126)
Anion gap: 13 (ref 5–15)
BUN: 15 mg/dL (ref 8–23)
CO2: 22 mmol/L (ref 22–32)
Calcium: 8.4 mg/dL — ABNORMAL LOW (ref 8.9–10.3)
Chloride: 102 mmol/L (ref 98–111)
Creatinine, Ser: 0.64 mg/dL (ref 0.44–1.00)
GFR calc Af Amer: >60 mL/min (ref >60)
GFR calc non Af Amer: >60 mL/min (ref >60)
Glucose, Bld: 139 mg/dL — ABNORMAL HIGH (ref 70–99)
Potassium: 4 mmol/L (ref 3.5–5.1)
Sodium: 137 mmol/L (ref 135–145)
Total Bilirubin: 0.7 mg/dL (ref 0.3–1.2)
Total Protein: 6.7 g/dL (ref 6.5–8.1)

## 2019-07-31 LAB — C-REACTIVE PROTEIN: CRP: 4.8 mg/dL — ABNORMAL HIGH (ref <1.0)

## 2019-07-31 LAB — D-DIMER, QUANTITATIVE: D-Dimer, Quant: 0.37 ug/mL-FEU (ref 0.00–0.50)

## 2019-07-31 LAB — MAGNESIUM: Magnesium: 1.5 mg/dL — ABNORMAL LOW (ref 1.7–2.4)

## 2019-07-31 LAB — LACTATE DEHYDROGENASE: LDH: 270 U/L — ABNORMAL HIGH (ref 98–192)

## 2019-07-31 LAB — FERRITIN: Ferritin: 352 ng/mL — ABNORMAL HIGH (ref 11–307)

## 2019-07-31 NOTE — Progress Notes (Signed)
PROGRESS NOTE    Tracy PaganiniYvonne D Timm  JYN:[id number] DOB: 05/20/1941 DOA: 07/28/2019 PCP: Tresa GarterPlotnikov, Aleksei V, MD   Brief Narrative:  Patient is a 78 year old female with history of advanced dementia, stroke, hypertension, diabetes, depression, anxiety, restless leg syndrome who presented from home with progressive weakness, poor appetite, inability to ambulate.  Patient lives with her family(husband and son).  As per her family, she has progressively declined over last year.  But since last few days, she is more fatigued, has poor appetite.  Family did not report any fever, cough or any respiratory symptoms.  She was hemodynamically stable on presentation.  Covid test positive.  Chest x-ray did not show any pneumonia.  She saturating fine on room air.  [facility].  PT/OT evaluation pending.  Assessment & Plan:   Principal Problem:   COVID-19 virus infection Active Problems:   Weakness   Covid-19, acute infection, without hypoxia, POA:  - Continues to have low-grade fevers since admission - improving generally -Continue steroids, hold off on remdesivir - Continues without hypoxia, hemodynamically stable. - X-ray personally reviewed, without overt infiltrate - Continue supportive care.  Tylenol for fever.  Continue zinc and vitamin C.  - Continue daily labs.   Fatigue, poor appetite, difficulty ambulation, failure to thrive 2/2 above, POA -Most likely the symptoms are due to her Covid infection.  Per family patient has been progressively declining.  Continue supportive care.  PT/OT evaluation pending.  Likely to need skilled nursing facility on discharge but could be difficult due to Covid status.  Acute metabolic encephalopathy on chronic advanced dementia: -Further discussion with family indicates patient is not currently at baseline -Patient is generally conversational, occasionally recognizes family members but typically able to get around  the house without assistance. -Daughter indicates the patient does on occasion experience hallucinations and mood disorder has been markedly well controlled over the past few months on current medication treatment -Patient poorly verbal, unable to orient the patient  -Continue home medications including donepezil,   History of stroke:  -Continue aspirin.She is usually ambulatory when she is at her baseline.  PT to follow along  GERD: Continue PPI  Diabetes type 2: Continue sliding scale insulin for now.  On metformin at home.  Anxiety/depression: Continue current medicines.  DVT prophylaxis: Lovenox Code Status: Full Family Communication: Daughter updated on phone Disposition Plan: Skilled nursing facility versus home health PT. family now requesting evaluation for SNF seems certainly reasonable given patient's advanced age, needs, and ambulatory dysfunction.  Antimicrobials:  Anti-infectives (From admission, onward)   None     Subjective: No acute issues or events overnight. Remains confused poorly verbal.  Objective: Vitals:   07/30/19 2000 07/31/19 0400 07/31/19 0500 07/31/19 0751  BP: 121/63 121/74  (!) 143/72  Pulse: 69 67  81  Resp: 20 17  13   Temp: 99 F (37.2 C) 98.3 F (36.8 C)  100.3 F (37.9 C)  TempSrc: Axillary Oral  Axillary  SpO2: 98% 96%  95%  Weight:   59.7 kg   Height:        Intake/Output Summary (Last 24 hours) at 07/31/2019 0837 Last data filed at 07/31/2019 0300 Gross per 24 hour  Intake 467.17 ml  Output -  Net 467.17 ml   Filed Weights   07/28/19 1009 07/29/19 2000 07/31/19 0500  Weight: 65.8 kg 62.5 kg 59.7 kg    Examination:  General exam: Appears calm and comfortable ,Not in distress,elderly female, pleasantly confused  does not follow commands HEENT: Ear/Nose normal on gross exam Respiratory system: Bilateral equal air entry, without overt wheezes or crackles  Cardiovascular system: S1 & S2 heard, RRR.  No pedal edema.  Gastrointestinal system: Abdomen is nondistended, soft and nontender.  Central nervous system: Alert and awake but not oriented.  No obvious focal neurological deficits Extremities: No edema, no clubbing ,no cyanosis Skin: No rashes, lesions or ulcers,no icterus ,no pallor  Data Reviewed: I have personally reviewed following labs and imaging studies  CBC: Recent Labs  Lab 07/28/19 1155 07/29/19 0506  WBC 4.1 3.6*  NEUTROABS 2.4  --   HGB 12.8 11.5*  HCT 39.6 35.5*  MCV 93.8 92.7  PLT 225 237   Basic Metabolic Panel: Recent Labs  Lab 07/28/19 1155 07/29/19 0506 07/31/19 0424  NA 138 139 137  K 3.2* 3.8 4.0  CL 103 108 102  CO2 23 22 22   GLUCOSE 92 82 139*  BUN 13 9 15   CREATININE 0.87 0.85 0.64  CALCIUM 8.2* 7.7* 8.4*  MG  --   --  1.5*   GFR: Estimated Creatinine Clearance: 45.8 mL/min (by C-G formula based on SCr of 0.64 mg/dL). Liver Function Tests: Recent Labs  Lab 07/28/19 1155 07/31/19 0424  AST 23 23  ALT 13 14  ALKPHOS 48 53  BILITOT 0.7 0.7  PROT 6.6 6.7  ALBUMIN 3.6 3.2*   CBG: Recent Labs  Lab 07/30/19 0934 07/30/19 1115 07/30/19 1632 07/30/19 2247 07/31/19 0738  GLUCAP 69* 75 84 150* 106*   Sepsis Labs: Recent Labs  Lab 07/30/19 0410 07/30/19 0642  PROCALCITON 0.24  --   LATICACIDVEN 1.4 1.9    Recent Results (from the past 240 hour(s))  Urine culture     Status: None   Collection Time: 07/28/19 11:00 AM   Specimen: Urine, Clean Catch  Result Value Ref Range Status   Specimen Description   Final    URINE, CLEAN CATCH Performed at [facility], [name] [address] [place], [place] [address]    Special Requests   Final    NONE Performed at [facility], [place] [address], [place], [place] [address]    Culture   Final    NO GROWTH Performed at [facility], [place] [address], [place], [place] [address]    Report Status 07/29/2019 FINAL  Final  SARS CORONAVIRUS 2 (TAT 6-24 HRS)  Nasopharyngeal Nasopharyngeal Swab     Status: Abnormal   Collection Time: 07/28/19 12:46 PM   Specimen: Nasopharyngeal Swab  Result Value Ref Range Status   SARS Coronavirus 2 POSITIVE (A) NEGATIVE Final    Comment: RESULT CALLED TO, READ BACK BY AND VERIFIED WITH: V.JACKSON RN 2026 07/28/2019 MCCORMICK K (NOTE) SARS-CoV-2 target nucleic acids are DETECTED. The SARS-CoV-2 RNA is generally detectable in upper and lower respiratory specimens during the acute phase of infection. Positive results are indicative of active infection with SARS-CoV-2. Clinical  correlation with patient history and other diagnostic information is necessary to determine patient infection status. Positive results do  not rule out bacterial infection or co-infection with other viruses. The expected result is Negative. Fact Sheet for Patients: SugarRoll.be Fact Sheet for Healthcare Providers: https://www.woods-mathews.com/ This test is not yet approved or cleared by the [place] FDA and  has been authorized for detection and/or diagnosis of SARS-CoV-2 by FDA under an Emergency Use Authorization (EUA). This EUA will remain  in effect (meaning this test can be used)  for the duration  of the COVID-19 declaration under Section 564(b)(1) of the Act, 21 U.S.C. section 360bbb-3(b)(1), unless the authorization is terminated or revoked sooner. Performed at [facility], [address] [place], [place] [address]     Radiology Studies: No results found.  Scheduled Meds: . aspirin EC  81 mg Oral Daily  . cetaphil  1 application Topical BID  . cholecalciferol  1,000 Units Oral Daily  . dexamethasone (DECADRON) injection  6 mg Intravenous Q24H  . donepezil  10 mg Oral QHS  . doxepin  25 mg Oral BID  . enoxaparin (LOVENOX) injection  40 mg Subcutaneous Q24H  . escitalopram  10 mg Oral Daily  . famotidine  40 mg Oral Daily  . feeding supplement (ENSURE ENLIVE)  237  mL Oral TID BM  . insulin aspart  0-9 Units Subcutaneous TID WC  . multivitamin with minerals  1 tablet Oral Daily  . pantoprazole  40 mg Oral Daily  . spironolactone  25 mg Oral Daily  . cyanocobalamin  1,000 mcg Oral Daily  . vitamin C  500 mg Oral BID  . zinc sulfate  220 mg Oral Daily   Continuous Infusions: . dextrose 5 % and 0.45% NaCl 30 mL/hr at 07/30/19 1700     LOS: 1 day   Time spent: 35 mins  Azucena Fallen, DO [facility] Pager [phone]  If 7PM-7AM, please contact night-coverage [email or web address] Password [facility] 07/31/2019, 8:37 AM

## 2019-07-31 NOTE — Progress Notes (Signed)
Physical Therapy Treatment Patient Details Name: VALE PERAZA MRN: [id number] DOB: [birth year] Today's Date: 07/31/2019    History of Present Illness Pt was admitted with weakness and found to have COVID.  PMH:  ? mild compression fx at T12, CVA, DM, HTN and dementia    PT Comments    Pt making steady progress with mobility. Pt was able to get to EOB with mod-max a, sits unsupported EOB and also attempted sit<>stand x 3 with max a and RW. Pt stands briefly but in very flexed position with trunk, hip and knees flexed. Overall shows increased activity tolerance and also independence this session.    Follow Up Recommendations  SNF     Equipment Recommendations       Recommendations for Other Services       Precautions / Restrictions Precautions Precautions: Fall Restrictions Weight Bearing Restrictions: No    Mobility  Bed Mobility Overal bed mobility: Needs Assistance Bed Mobility: Supine to Sit;Sit to Supine Rolling: Mod assist   Supine to sit: Mod assist Sit to supine: Mod assist   General bed mobility comments: needs max cues to complete tasks  Transfers Overall transfer level: Needs assistance Equipment used: Rolling walker (2 wheeled) Transfers: Sit to/from Stand Sit to Stand: Max assist         General transfer comment: only able to complete partial sit<>stand this am x 3  Ambulation/Gait             General Gait Details: did not attempt ambulation this session as was not able to fully stand with RW   Stairs             Wheelchair Mobility    Modified Rankin (Stroke Patients Only)       Balance Overall balance assessment: Needs assistance Sitting-balance support: Feet unsupported Sitting balance-Leahy Scale: Fair Sitting balance - Comments: was able to sit EOB unsupported for some time   Standing balance support: During functional activity;Bilateral upper extremity supported Standing balance-Leahy Scale: Poor                               Cognition Arousal/Alertness: Lethargic Behavior During Therapy: Restless Overall Cognitive Status: No family/caregiver present to determine baseline cognitive functioning                                        Exercises      General Comments General comments (skin integrity, edema, etc.): pt still very confused and speaking nonsensically throughout. was agreeable to tx and did give a very good effort but still needing mod-max a with all functional mobility/ was able to get to EOB and also sit unsupported for some time but unable to fully stand with RW, attempted x 3 with max a and was minimally able to get into standing position with trunk, hips and knees flexed and only briefly each time      Pertinent Vitals/Pain Pain Assessment: Faces Faces Pain Scale: No hurt Pain Intervention(s): Limited activity within patient's tolerance    Home Living                      Prior Function            PT Goals (current goals can now be found in the care plan section) Acute Rehab PT Goals Patient Stated Goal:  unable to give goals just speaking non sensically PT Goal Formulation: Patient unable to participate in goal setting Time For Goal Achievement: 08/19/19 Potential to Achieve Goals: Fair Progress towards PT goals: Progressing toward goals    Frequency    Min 2X/week      PT Plan Current plan remains appropriate    Co-evaluation              AM-PAC PT "6 Clicks" Mobility   Outcome Measure  Help needed turning from your back to your side while in a flat bed without using bedrails?: A Lot Help needed moving from lying on your back to sitting on the side of a flat bed without using bedrails?: A Lot Help needed moving to and from a bed to a chair (including a wheelchair)?: Total Help needed standing up from a chair using your arms (e.g., wheelchair or bedside chair)?: A Lot Help needed to walk in hospital room?:  Total Help needed climbing 3-5 steps with a railing? : Total 6 Click Score: 9    End of Session   Activity Tolerance: Patient limited by fatigue;Patient limited by lethargy Patient left: in bed;with call bell/phone within reach;with bed alarm set Nurse Communication: Mobility status PT Visit Diagnosis: Difficulty in walking, not elsewhere classified (R26.2);Muscle weakness (generalized) (M62.81)     Time: 1751-0258 PT Time Calculation (min) (ACUTE ONLY): 23 min  Charges:  $Therapeutic Activity: 23-37 mins                     Horald Chestnut, PT    Delford Field 07/31/2019, 2:39 PM

## 2019-07-31 NOTE — Progress Notes (Signed)
Spoke with daughter Sunday Spillers.  All questions and concerns addressed.

## 2019-07-31 NOTE — Consult Note (Signed)
   [facility] Inpatient Consult   07/31/2019  Tracy [name] September 16, 1940 [id number]   Patient is currently active with [facility] for chronic disease management services.  Patient has been engaged by a [place] Coordinator in the [place] [CSNP] for Diabetes Management, who is already aware of patient's hospitalization.  Patient with severe dementia and admitted with COVID-19 positive.  Plan:  Follow progression and review disposition needs of  Inpatient [facility] team member and to make aware that [facility] following in [place].  Current PT recommendations showing for a SNF for rehab.  Patient is from home with family prior to admission.  Of note, [facility] Care Management services does not replace or interfere with any services that are needed or arranged by inpatient [facility] care management team.    For additional questions or referrals please contact:  Natividad Brood, RN BSN [facility] Liaison  [phone] business mobile phone Toll free office [phone]  Fax number: [phone] [email or web address]

## 2019-08-01 LAB — COMPREHENSIVE METABOLIC PANEL
ALT: 17 U/L (ref 0–44)
AST: 28 U/L (ref 15–41)
Albumin: 3 g/dL — ABNORMAL LOW (ref 3.5–5.0)
Alkaline Phosphatase: 53 U/L (ref 38–126)
Anion gap: 11 (ref 5–15)
BUN: 14 mg/dL (ref 8–23)
CO2: 27 mmol/L (ref 22–32)
Calcium: 8.4 mg/dL — ABNORMAL LOW (ref 8.9–10.3)
Chloride: 101 mmol/L (ref 98–111)
Creatinine, Ser: 0.8 mg/dL (ref 0.44–1.00)
GFR calc Af Amer: >60 mL/min (ref >60)
GFR calc non Af Amer: >60 mL/min (ref >60)
Glucose, Bld: 194 mg/dL — ABNORMAL HIGH (ref 70–99)
Potassium: 3.9 mmol/L (ref 3.5–5.1)
Sodium: 139 mmol/L (ref 135–145)
Total Bilirubin: 0.4 mg/dL (ref 0.3–1.2)
Total Protein: 6.5 g/dL (ref 6.5–8.1)

## 2019-08-01 LAB — C-REACTIVE PROTEIN: CRP: 4.4 mg/dL — ABNORMAL HIGH (ref <1.0)

## 2019-08-01 LAB — FERRITIN: Ferritin: 397 ng/mL — ABNORMAL HIGH (ref 11–307)

## 2019-08-01 LAB — LACTATE DEHYDROGENASE: LDH: 289 U/L — ABNORMAL HIGH (ref 98–192)

## 2019-08-01 LAB — GLUCOSE, CAPILLARY
Glucose-Capillary: 112 mg/dL — ABNORMAL HIGH (ref 70–99)
Glucose-Capillary: 127 mg/dL — ABNORMAL HIGH (ref 70–99)
Glucose-Capillary: 131 mg/dL — ABNORMAL HIGH (ref 70–99)

## 2019-08-01 LAB — D-DIMER, QUANTITATIVE: D-Dimer, Quant: 0.46 ug/mL-FEU (ref 0.00–0.50)

## 2019-08-01 LAB — MAGNESIUM: Magnesium: 1.6 mg/dL — ABNORMAL LOW (ref 1.7–2.4)

## 2019-08-01 MED ORDER — SODIUM CHLORIDE 0.9 % IV SOLN
INTRAVENOUS | Status: DC
  Administered 2019-08-01 – 2019-08-06 (×7): via INTRAVENOUS

## 2019-08-01 NOTE — Plan of Care (Signed)
  Problem: Clinical Measurements: Goal: Will remain free from infection Outcome: Progressing   Problem: Elimination: Goal: Will not experience complications related to bowel motility Outcome: Progressing   Problem: Safety: Goal: Ability to remain free from injury will improve Outcome: Progressing   

## 2019-08-01 NOTE — Progress Notes (Signed)
PROGRESS NOTE    Tracy [name]  NFA:[id number] DOB: 04/22/1941 DOA: 07/28/2019 PCP: Tresa GarterPlotnikov, Aleksei V, MD   Brief Narrative:  Patient is a 78 year old female with history of advanced dementia, stroke, hypertension, diabetes, depression, anxiety, restless leg syndrome who presented from home with progressive weakness, poor appetite, inability to ambulate.  Patient lives with her family(husband and son).  As per her family, she has progressively declined over last year.  But since last few days, she is more fatigued, has poor appetite.  Family did not report any fever, cough or any respiratory symptoms.  She was hemodynamically stable on presentation.  Covid test positive.  Chest x-ray did not show any pneumonia.  She saturating fine on room air.  [facility]  PT/OT evaluation pending.  Assessment & Plan:   Principal Problem:   COVID-19 virus infection Active Problems:   Weakness   Covid-19, acute infection, without hypoxia, POA:  - Continues to have low-grade fevers since admission - improving generally -Continue steroids, hold off on remdesivir - Continues without hypoxia, hemodynamically stable. - X-ray personally reviewed, without overt infiltrate - Continue supportive care.  Tylenol for fever.  Continue zinc and vitamin C.  - Continue daily labs.  SpO2: 95 % - currently on Room air Recent Labs    07/31/19 0424 08/01/19 0037  DDIMER 0.37 0.46  FERRITIN 352* 397*  LDH 270* 289*  CRP 4.8* 4.4*    Lab Results  Component Value Date   SARSCOV2NAA POSITIVE (A) 07/28/2019     Fatigue, poor appetite, difficulty ambulation, failure to thrive 2/2 above, POA Severe protein caloric malnutrition, POA -Most likely the symptoms are due to her Covid infection.   -Per family patient has been progressively declining.   -Continue supportive care. IV fluids until PO intake improves -PT/OT evaluation pending.  Likely to need skilled  nursing facility on discharge but could be difficult due to Covid status.  Acute metabolic encephalopathy on chronic advanced dementia: -Further discussion with family indicates patient is not currently at baseline -Patient is generally conversational, occasionally recognizes family members but typically able to get around the house without assistance. -Daughter indicates the patient does on occasion experience hallucinations and mood disorder has been markedly well controlled over the past few months on current medication treatment -Patient poorly verbal, unable to orient the patient  -Continue home medications including donepezil,   History of stroke:  -Continue aspirin.She is usually ambulatory when she is at her baseline.  PT to follow along  GERD: Continue PPI  Diabetes type 2: Continue sliding scale insulin for now.  On metformin at home.  Anxiety/depression: Continue current medicines.  DVT prophylaxis: Lovenox Code Status: Full Family Communication: Daughter updated on phone Disposition Plan: Skilled nursing facility given patient's advanced age, needs, and ambulatory dysfunction.  Antimicrobials:  Anti-infectives (From admission, onward)   None     Subjective: No acute issues or events overnight. Remains confused poorly verbal.  Objective: Vitals:   07/31/19 2041 08/01/19 0400 08/01/19 0410 08/01/19 0500  BP: 112/71  118/73   Pulse: 94 83    Resp: 18 15    Temp: 98.9 F (37.2 C) 98.2 F (36.8 C)    TempSrc: Axillary Axillary    SpO2: 97% 95%    Weight:    58.5 kg  Height:        Intake/Output Summary (Last 24 hours) at 08/01/2019 0827 Last data filed at 07/31/2019 1600 Gross per 24 hour  Intake  389.98 ml  Output 400 ml  Net -10.02 ml   Filed Weights   07/29/19 2000 07/31/19 0500 08/01/19 0500  Weight: 62.5 kg 59.7 kg 58.5 kg    Examination:  General exam: Appears calm and comfortable ,Not in distress,elderly female, pleasantly confused does not  follow commands HEENT: Ear/Nose normal on gross exam, mucus membranes appear dry Respiratory system: Bilateral equal air entry, without overt wheezes or crackles  Cardiovascular system: S1 & S2 heard, RRR.  No pedal edema. Gastrointestinal system: Abdomen is nondistended, soft and nontender.  Central nervous system: Alert and awake but not oriented.  No obvious focal neurological deficits Extremities: No edema, no clubbing ,no cyanosis Skin: No rashes, lesions or ulcers,no icterus ,no pallor  Data Reviewed: I have personally reviewed following labs and imaging studies  CBC: Recent Labs  Lab 07/28/19 1155 07/29/19 0506  WBC 4.1 3.6*  NEUTROABS 2.4  --   HGB 12.8 11.5*  HCT 39.6 35.5*  MCV 93.8 92.7  PLT 225 616   Basic Metabolic Panel: Recent Labs  Lab 07/28/19 1155 07/29/19 0506 07/31/19 0424 08/01/19 0037  NA 138 139 137 139  K 3.2* 3.8 4.0 3.9  CL 103 108 102 101  CO2 23 22 22 27   GLUCOSE 92 82 139* 194*  BUN 13 9 15 14   CREATININE 0.87 0.85 0.64 0.80  CALCIUM 8.2* 7.7* 8.4* 8.4*  MG  --   --  1.5* 1.6*   GFR: Estimated Creatinine Clearance: 45.8 mL/min (by C-G formula based on SCr of 0.8 mg/dL). Liver Function Tests: Recent Labs  Lab 07/28/19 1155 07/31/19 0424 08/01/19 0037  AST 23 23 28   ALT 13 14 17   ALKPHOS 48 53 53  BILITOT 0.7 0.7 0.4  PROT 6.6 6.7 6.5  ALBUMIN 3.6 3.2* 3.0*   CBG: Recent Labs  Lab 07/30/19 2247 07/31/19 0738 07/31/19 1137 07/31/19 1620 07/31/19 2109  GLUCAP 150* 106* 124* 112* 183*   Sepsis Labs: Recent Labs  Lab 07/30/19 0410 07/30/19 0642  PROCALCITON 0.24  --   LATICACIDVEN 1.4 1.9    Recent Results (from the past 240 hour(s))  Urine culture     Status: None   Collection Time: 07/28/19 11:00 AM   Specimen: Urine, Clean Catch  Result Value Ref Range Status   Specimen Description   Final    URINE, CLEAN CATCH Performed at [facility], [place] [address], [place], [place] [address]    Special  Requests   Final    NONE Performed at [facility], [place] [address] [place], [place] [address]    Culture   Final    NO GROWTH Performed at [facility], [place] [address] [place], [place] [address]    Report Status 07/29/2019 FINAL  Final  SARS CORONAVIRUS 2 (TAT 6-24 HRS) Nasopharyngeal Nasopharyngeal Swab     Status: Abnormal   Collection Time: 07/28/19 12:46 PM   Specimen: Nasopharyngeal Swab  Result Value Ref Range Status   SARS Coronavirus 2 POSITIVE (A) NEGATIVE Final    Comment: RESULT CALLED TO, READ BACK BY AND VERIFIED WITH: V.JACKSON RN 2026 07/28/2019 MCCORMICK K (NOTE) SARS-CoV-2 target nucleic acids are DETECTED. The SARS-CoV-2 RNA is generally detectable in upper and lower respiratory specimens during the acute phase of infection. Positive results are indicative of active infection with SARS-CoV-2. Clinical  correlation with patient history and other diagnostic information is necessary to determine patient infection status. Positive results do  not rule out bacterial infection  or co-infection with other viruses. The expected result is Negative. Fact Sheet for Patients: HairSlick.no Fact Sheet for Healthcare Providers: quierodirigir.com This test is not yet approved or cleared by the [place] FDA and  has been authorized for detection and/or diagnosis of SARS-CoV-2 by FDA under an Emergency Use Authorization (EUA). This EUA will remain  in effect (meaning this test can be used)  for the duration of the COVID-19 declaration under Section 564(b)(1) of the Act, 21 U.S.C. section 360bbb-3(b)(1), unless the authorization is terminated or revoked sooner. Performed at [facility], [address], [place], [place] [address]     Radiology Studies: No results found.  Scheduled Meds: . aspirin EC  81 mg Oral Daily  . cetaphil  1 application Topical BID  . cholecalciferol  1,000  Units Oral Daily  . dexamethasone (DECADRON) injection  6 mg Intravenous Q24H  . donepezil  10 mg Oral QHS  . doxepin  25 mg Oral BID  . enoxaparin (LOVENOX) injection  40 mg Subcutaneous Q24H  . escitalopram  10 mg Oral Daily  . famotidine  40 mg Oral Daily  . feeding supplement (ENSURE ENLIVE)  237 mL Oral TID BM  . insulin aspart  0-9 Units Subcutaneous TID WC  . multivitamin with minerals  1 tablet Oral Daily  . pantoprazole  40 mg Oral Daily  . spironolactone  25 mg Oral Daily  . cyanocobalamin  1,000 mcg Oral Daily  . vitamin C  500 mg Oral BID  . zinc sulfate  220 mg Oral Daily   Continuous Infusions: . dextrose 5 % and 0.45% NaCl 30 mL/hr at 07/31/19 1600     LOS: 2 days   Time spent: 35 mins  Azucena Fallen, DO [facility] Pager [phone]  If 7PM-7AM, please contact night-coverage [email or web address] Password [facility] 08/01/2019, 8:27 AM

## 2019-08-01 NOTE — Progress Notes (Signed)
Nutrition Follow-up / Consult  DOCUMENTATION CODES:   Not applicable  INTERVENTION:    If intake remains minimal, recommend placing a feeding tube for TF. Cortrak service is available on Wednesdays at [facility], or RN can place NG tube at bedside. Jevity 1.2 at 60 ml/h would provide 1728 kcal, 80 gm protein, 1166 ml free water daily.    Continue MVI with minerals daily   Continue to offer Ensure Enlive po TID when alert, each supplement provides 350 kcal and 20 grams of protein   Continue Hormel Shake daily with Breakfast which provides 520 kcals and 22 g of protein and Magic cup BID with lunch and dinner, each supplement provides 290 kcal and 9 grams of protein, automatically on meal trays to optimize nutritional intake.   NUTRITION DIAGNOSIS:   Increased nutrient needs related to acute illness (COVID-19) as evidenced by estimated needs   Ongoing   GOAL:   Patient will meet greater than or equal to 90% of their needs   Progressing  MONITOR:   PO intake, Supplement acceptance, Labs, Weight trends  REASON FOR ASSESSMENT:   Consult Assessment of nutrition requirement/status  ASSESSMENT:   78 year old female who presented to the ED on 11/16 with weakness and poor PO intake. PMH of HTN, T2DM, multiple strokes, dementia, GERD, anxiety, depression. Pt was found to be COVID-19 positive.   Labs and medications reviewed.  Patient is on a regular diet, consuming 0-25% of meals. She is refusing most Ensure Enlive supplements or too lethargic to safely take PO's.   Patient weighed 64 kg one month ago. Now down to 58.5 kg. 9% weight loss within one month is significant for the time frame.  NUTRITION - FOCUSED PHYSICAL EXAM:  Unable to complete at this time.  Diet Order:   Diet Order            Diet regular Room service appropriate? Yes; Fluid consistency: Thin  Diet effective now              EDUCATION NEEDS:   No education needs have been identified at this  time  Skin:  Skin Assessment: Reviewed RN Assessment  Last BM:  11/17  Height:   Ht Readings from Last 1 Encounters:  07/28/19 5\' 2"  (1.575 m)    Weight:   Wt Readings from Last 1 Encounters:  08/01/19 58.5 kg    Ideal Body Weight:  50 kg  BMI:  Body mass index is 23.59 kg/m.  Estimated Nutritional Needs:   Kcal:  1600-1800  Protein:  80-90 gm  Fluid:  >/= 1.6 L   Molli Barrows, RD, LDN, [place] Pager [phone] After Hours Pager [phone]

## 2019-08-02 LAB — BASIC METABOLIC PANEL
Anion gap: 14 (ref 5–15)
BUN: 15 mg/dL (ref 8–23)
CO2: 27 mmol/L (ref 22–32)
Calcium: 8.6 mg/dL — ABNORMAL LOW (ref 8.9–10.3)
Chloride: 101 mmol/L (ref 98–111)
Creatinine, Ser: 0.73 mg/dL (ref 0.44–1.00)
GFR calc Af Amer: >60 mL/min (ref >60)
GFR calc non Af Amer: >60 mL/min (ref >60)
Glucose, Bld: 150 mg/dL — ABNORMAL HIGH (ref 70–99)
Potassium: 4.6 mmol/L (ref 3.5–5.1)
Sodium: 142 mmol/L (ref 135–145)

## 2019-08-02 LAB — CBC
HCT: 40.8 % (ref 36.0–46.0)
Hemoglobin: 13.3 g/dL (ref 12.0–15.0)
MCH: 29.8 pg (ref 26.0–34.0)
MCHC: 32.6 g/dL (ref 30.0–36.0)
MCV: 91.3 fL (ref 80.0–100.0)
Platelets: 351 10*3/uL (ref 150–400)
RBC: 4.47 MIL/uL (ref 3.87–5.11)
RDW: 13.7 % (ref 11.5–15.5)
WBC: 11.7 10*3/uL — ABNORMAL HIGH (ref 4.0–10.5)
nRBC: 0 % (ref 0.0–0.2)

## 2019-08-02 LAB — GLUCOSE, CAPILLARY
Glucose-Capillary: 84 mg/dL (ref 70–99)
Glucose-Capillary: 97 mg/dL (ref 70–99)
Glucose-Capillary: 101 mg/dL — ABNORMAL HIGH (ref 70–99)
Glucose-Capillary: 153 mg/dL — ABNORMAL HIGH (ref 70–99)
Glucose-Capillary: 147 mg/dL — ABNORMAL HIGH (ref 70–99)

## 2019-08-02 LAB — D-DIMER, QUANTITATIVE: D-Dimer, Quant: 0.54 ug/mL-FEU — ABNORMAL HIGH (ref 0.00–0.50)

## 2019-08-02 LAB — C-REACTIVE PROTEIN: CRP: 9.6 mg/dL — ABNORMAL HIGH (ref <1.0)

## 2019-08-02 NOTE — Progress Notes (Signed)
PROGRESS NOTE    Tracy [name]  NLZ:[id number] DOB: September 28, 1940 DOA: 07/28/2019 PCP: Tresa Garter, MD   Brief Narrative:  Patient is a 78 year old female with history of advanced dementia, stroke, hypertension, diabetes, depression, anxiety, restless leg syndrome who presented from home with progressive weakness, poor appetite, inability to ambulate.  Patient lives with her family(husband and son).  As per her family, she has progressively declined over last year.  But since last few days, she is more fatigued, has poor appetite.  Family did not report any fever, cough or any respiratory symptoms.  She was hemodynamically stable on presentation.  Covid test positive.  Chest x-ray did not show any pneumonia.  She saturating fine on room air.  We are transferring her to [facility] today.  PT/OT evaluation pending.  Assessment & Plan:  Principal Problem:   COVID-19 virus infection Active Problems:   Weakness   Covid-19, acute infection, without hypoxia, POA:  - Continues to have low-grade fevers since admission - improving generally -Continue steroids, hold off on remdesivir - X-ray personally reviewed, without overt infiltrate - Continue supportive care.  Tylenol for fever.  Continue zinc and vitamin C.  - Continue daily labs. SpO2: 92 % O2 Flow Rate (L/min): 2 L/min Recent Labs    07/31/19 0424 08/01/19 0037 08/02/19 0045  DDIMER 0.37 0.46 0.54*  FERRITIN 352* 397*  --   LDH 270* 289*  --   CRP 4.8* 4.4* 9.6*   Lab Results  Component Value Date   SARSCOV2NAA POSITIVE (A) 07/28/2019    Fatigue, poor appetite, difficulty ambulation, failure to thrive 2/2 above, POA; Severe protein caloric malnutrition, POA - Most likely the symptoms are due to her Covid infection.   - Per family patient has been progressively declining over months/year.   - Continue supportive care. IV fluids until PO intake improves - PT/OT evaluation pending. Likely to need skilled  nursing facility on discharge but could be difficult due to Covid status - Nutrition recommending tube feeds but given patient's baseline dementia this would likely be more traumatic than useful - patient continues to pull at IV/external catheter  Acute metabolic encephalopathy on chronic advanced dementia: -Further discussion with family indicates patient is not currently at baseline -Patient is generally conversational, occasionally recognizes family members but typically able to get around the house without "much assistance". -Daughter indicates the patient does on occasion experience hallucinations and mood disorder but has been markedly well controlled over the past few months on current medication treatment -Patient poorly verbal, unable to orient the patient  -Continue home medications including donepezil  History of stroke:  -Continue aspirin.She is usually ambulatory when she is at her baseline.  PT to follow along  GERD: Continue PPI  Diabetes type 2: Continue sliding scale insulin for now. On metformin at home.  Anxiety/depression: Continue current medicines.  Goals of care Lengthy discussion with daughter over the phone. Very difficult conversation daughter as patient's husband Primitivo Gauze) is now at [facility] with worsening status. Will continue current plan with patient as above - likely to discuss palliative care in the next 24-48h if patient does not begin to improve.  DVT prophylaxis: Lovenox Code Status: Full Family Communication: Daughter updated on phone Disposition Plan: Skilled nursing facility given patient's advanced age, needs, and ambulatory dysfunction.  Antimicrobials:  Anti-infectives (From admission, onward)   None     Subjective: No acute issues or events overnight. Remains confused poorly verbal.  Objective: Vitals:   08/02/19 0600 08/02/19  9211 08/02/19 0903 08/02/19 1543  BP: (!) 165/81  (!) 142/79   Pulse: 95 95 (!) 104   Resp: 20 17 19 20   Temp:  (!)  101 F (38.3 C) 98.4 F (36.9 C) (!) 101 F (38.3 C)  TempSrc:  Axillary Axillary Oral  SpO2: 95% 96% 93% 92%  Weight:      Height:        Intake/Output Summary (Last 24 hours) at 08/02/2019 1633 Last data filed at 08/02/2019 1300 Gross per 24 hour  Intake 1349.77 ml  Output 350 ml  Net 999.77 ml   Filed Weights   07/31/19 0500 08/01/19 0500 08/02/19 0444  Weight: 59.7 kg 58.5 kg 59.1 kg    Examination:  General exam: Appears calm and comfortable ,Not in distress,elderly female, pleasantly confused does not follow commands HEENT: Ear/Nose normal on gross exam, mucus membranes appear dry Respiratory system: Bilateral equal air entry, without overt wheezes or crackles  Cardiovascular system: S1 & S2 heard, RRR.  No pedal edema. Gastrointestinal system: Abdomen is nondistended, soft and nontender.  Central nervous system: Alert and awake but not oriented.  No obvious focal neurological deficits Extremities: No edema, no clubbing ,no cyanosis Skin: No rashes, lesions or ulcers,no icterus ,no pallor  Data Reviewed: I have personally reviewed following labs and imaging studies  CBC: Recent Labs  Lab 07/28/19 1155 07/29/19 0506 08/02/19 0045  WBC 4.1 3.6* 11.7*  NEUTROABS 2.4  --   --   HGB 12.8 11.5* 13.3  HCT 39.6 35.5* 40.8  MCV 93.8 92.7 91.3  PLT 225 198 941   Basic Metabolic Panel: Recent Labs  Lab 07/28/19 1155 07/29/19 0506 07/31/19 0424 08/01/19 0037 08/02/19 0045  NA 138 139 137 139 142  K 3.2* 3.8 4.0 3.9 4.6  CL 103 108 102 101 101  CO2 23 22 22 27 27   GLUCOSE 92 82 139* 194* 150*  BUN 13 9 15 14 15   CREATININE 0.87 0.85 0.64 0.80 0.73  CALCIUM 8.2* 7.7* 8.4* 8.4* 8.6*  MG  --   --  1.5* 1.6*  --    GFR: Estimated Creatinine Clearance: 45.8 mL/min (by C-G formula based on SCr of 0.73 mg/dL). Liver Function Tests: Recent Labs  Lab 07/28/19 1155 07/31/19 0424 08/01/19 0037  AST 23 23 28   ALT 13 14 17   ALKPHOS 48 53 53  BILITOT 0.7 0.7  0.4  PROT 6.6 6.7 6.5  ALBUMIN 3.6 3.2* 3.0*   CBG: Recent Labs  Lab 08/01/19 0731 08/01/19 1217 08/01/19 1615 08/02/19 0902 08/02/19 1210  GLUCAP 112* 127* 131* 84 97   Sepsis Labs: Recent Labs  Lab 07/30/19 0410 07/30/19 0642  PROCALCITON 0.24  --   LATICACIDVEN 1.4 1.9    Recent Results (from the past 240 hour(s))  Urine culture     Status: None   Collection Time: 07/28/19 11:00 AM   Specimen: Urine, Clean Catch  Result Value Ref Range Status   Specimen Description   Final    URINE, CLEAN CATCH Performed at [facility], [place] [address], [place], [place] [address]    Special Requests   Final    NONE Performed at [facility], [place] [address], [place], [place] [address]    Culture   Final    NO GROWTH Performed at [facility], [place] [address] [place], [place] [address]    Report Status 07/29/2019 FINAL  Final  SARS CORONAVIRUS 2 (TAT 6-24 HRS) Nasopharyngeal Nasopharyngeal  Swab     Status: Abnormal   Collection Time: 07/28/19 12:46 PM   Specimen: Nasopharyngeal Swab  Result Value Ref Range Status   SARS Coronavirus 2 POSITIVE (A) NEGATIVE Final    Comment: RESULT CALLED TO, READ BACK BY AND VERIFIED WITH: V.JACKSON RN 2026 07/28/2019 MCCORMICK K (NOTE) SARS-CoV-2 target nucleic acids are DETECTED. The SARS-CoV-2 RNA is generally detectable in upper and lower respiratory specimens during the acute phase of infection. Positive results are indicative of active infection with SARS-CoV-2. Clinical  correlation with patient history and other diagnostic information is necessary to determine patient infection status. Positive results do  not rule out bacterial infection or co-infection with other viruses. The expected result is Negative. Fact Sheet for Patients: HairSlick.nohttps://www.fda.gov/media/138098/download Fact Sheet for Healthcare Providers: quierodirigir.comhttps://www.fda.gov/media/138095/download This test is not yet approved or  cleared by the [place] FDA and  has been authorized for detection and/or diagnosis of SARS-CoV-2 by FDA under an Emergency Use Authorization (EUA). This EUA will remain  in effect (meaning this test can be used)  for the duration of the COVID-19 declaration under Section 564(b)(1) of the Act, 21 U.S.C. section 360bbb-3(b)(1), unless the authorization is terminated or revoked sooner. Performed at [facility], [address] [place], [place] [address]     Radiology Studies: No results found.  Scheduled Meds: . aspirin EC  81 mg Oral Daily  . cetaphil  1 application Topical BID  . cholecalciferol  1,000 Units Oral Daily  . dexamethasone (DECADRON) injection  6 mg Intravenous Q24H  . donepezil  10 mg Oral QHS  . doxepin  25 mg Oral BID  . enoxaparin (LOVENOX) injection  40 mg Subcutaneous Q24H  . escitalopram  10 mg Oral Daily  . famotidine  40 mg Oral Daily  . feeding supplement (ENSURE ENLIVE)  237 mL Oral TID BM  . insulin aspart  0-9 Units Subcutaneous TID WC  . multivitamin with minerals  1 tablet Oral Daily  . pantoprazole  40 mg Oral Daily  . spironolactone  25 mg Oral Daily  . cyanocobalamin  1,000 mcg Oral Daily  . vitamin C  500 mg Oral BID  . zinc sulfate  220 mg Oral Daily   Continuous Infusions: . sodium chloride Stopped (08/02/19 0631)     LOS: 3 days   Time spent: 35 mins  Azucena FallenWilliam C Nakeisha Greenhouse, DO [facility] Pager [phone]  If 7PM-7AM, please contact night-coverage [email or web address] Password [email or web address] 08/02/2019, 4:33 PM

## 2019-08-02 NOTE — Progress Notes (Signed)
Updated daughter, Sunday Spillers, via phone. All questions answered at present time.

## 2019-08-02 NOTE — Plan of Care (Signed)
  Problem: Education: Goal: Knowledge of General Education information will improve Description: Including pain rating scale, medication(s)/side effects and non-pharmacologic comfort measures Outcome: Not Progressing   Problem: Clinical Measurements: Goal: Will remain free from infection Outcome: Not Progressing   Problem: Nutrition: Goal: Adequate nutrition will be maintained Outcome: Not Progressing   Problem: Safety: Goal: Ability to remain free from injury will improve Outcome: Not Progressing

## 2019-08-03 LAB — BASIC METABOLIC PANEL
Anion gap: 12 (ref 5–15)
BUN: 16 mg/dL (ref 8–23)
CO2: 27 mmol/L (ref 22–32)
Calcium: 8.5 mg/dL — ABNORMAL LOW (ref 8.9–10.3)
Chloride: 105 mmol/L (ref 98–111)
Creatinine, Ser: 0.9 mg/dL (ref 0.44–1.00)
GFR calc Af Amer: >60 mL/min (ref >60)
GFR calc non Af Amer: >60 mL/min (ref >60)
Glucose, Bld: 139 mg/dL — ABNORMAL HIGH (ref 70–99)
Potassium: 4.4 mmol/L (ref 3.5–5.1)
Sodium: 144 mmol/L (ref 135–145)

## 2019-08-03 LAB — CBC
HCT: 39.7 % (ref 36.0–46.0)
Hemoglobin: 13 g/dL (ref 12.0–15.0)
MCH: 30 pg (ref 26.0–34.0)
MCHC: 32.7 g/dL (ref 30.0–36.0)
MCV: 91.7 fL (ref 80.0–100.0)
Platelets: 361 10*3/uL (ref 150–400)
RBC: 4.33 MIL/uL (ref 3.87–5.11)
RDW: 13.6 % (ref 11.5–15.5)
WBC: 11.6 10*3/uL — ABNORMAL HIGH (ref 4.0–10.5)
nRBC: 0 % (ref 0.0–0.2)

## 2019-08-03 LAB — C-REACTIVE PROTEIN: CRP: 13.2 mg/dL — ABNORMAL HIGH (ref <1.0)

## 2019-08-03 LAB — D-DIMER, QUANTITATIVE: D-Dimer, Quant: 0.8 ug/mL-FEU — ABNORMAL HIGH (ref 0.00–0.50)

## 2019-08-03 LAB — GLUCOSE, CAPILLARY
Glucose-Capillary: 98 mg/dL (ref 70–99)
Glucose-Capillary: 91 mg/dL (ref 70–99)
Glucose-Capillary: 123 mg/dL — ABNORMAL HIGH (ref 70–99)

## 2019-08-03 MED ORDER — SODIUM CHLORIDE 0.9 % IV SOLN
100.0000 mg | INTRAVENOUS | Status: AC
  Administered 2019-08-04 – 2019-08-07 (×4): 100 mg via INTRAVENOUS
  Filled 2019-08-03 (×4): qty 100

## 2019-08-03 MED ORDER — SODIUM CHLORIDE 0.9 % IV SOLN
200.0000 mg | Freq: Once | INTRAVENOUS | Status: AC
  Administered 2019-08-03: 200 mg via INTRAVENOUS
  Filled 2019-08-03: qty 40

## 2019-08-03 NOTE — Plan of Care (Signed)
  Problem: Elimination: Goal: Will not experience complications related to urinary retention Outcome: Progressing   Problem: Skin Integrity: Goal: Risk for impaired skin integrity will decrease Outcome: Progressing   Problem: Clinical Measurements: Goal: Ability to maintain clinical measurements within normal limits will improve Outcome: Progressing   Problem: Nutrition: Goal: Adequate nutrition will be maintained Outcome: Not Progressing   Problem: Safety: Goal: Ability to remain free from injury will improve Outcome: Not Progressing

## 2019-08-03 NOTE — Progress Notes (Signed)
PROGRESS NOTE    Tracy [name]  WJX:[id number] DOB: [birth year] DOA: 07/28/2019 PCP: Tresa Garter, MD   Brief Narrative:  Patient is a 78 year old female with history of advanced dementia, stroke, hypertension, diabetes, depression, anxiety, restless leg syndrome who presented from home with progressive weakness, poor appetite, inability to ambulate.  Patient lives with her family(husband and son).  As per her family, she has progressively declined over last year.  But since last few days, she is more fatigued, has poor appetite.  Family did not report any fever, cough or any respiratory symptoms.  She was hemodynamically stable on presentation.  Covid test positive.  Chest x-ray did not show any pneumonia.  She saturating fine on room air.  We are transferring her to [facility] today.  PT/OT evaluation pending.  Assessment & Plan:  Principal Problem:   COVID-19 virus infection Active Problems:   Weakness   Covid-19, acute infection, without hypoxia, POA:  -Continues to have low-grade fevers since admission - improving generally -Continue steroids -Initiate remdesivir given labs/symptoms/hypoxia -X-ray personally reviewed, without overt infiltrate -Continue supportive care.  Tylenol for fever.  Continue zinc and vitamin C.   -Continue daily labs SpO2: 92 % O2 Flow Rate (L/min): 2 L/min Recent Labs    08/01/19 0037 08/02/19 0045 08/03/19 0030  DDIMER 0.46 0.54* 0.80*  FERRITIN 397*  --   --   LDH 289*  --   --   CRP 4.4* 9.6* 13.2*    Fatigue, poor appetite, difficulty ambulation, failure to thrive 2/2 above, POA; Severe protein caloric malnutrition, POA -Most likely the symptoms are due to her Covid infection.   -Per family patient has been progressively declining over months/year.   -Continue supportive care. IV fluids until PO intake improves -PT/OT evaluation pending. Likely to need skilled nursing facility on discharge but could be difficult due to  Covid status -Nutrition recommending tube feeds but given patient's baseline dementia this would likely be more traumatic than useful - patient continues to pull at IV/external catheter  Acute metabolic encephalopathy on chronic advanced dementia: -Further discussion with family indicates patient is not currently at baseline -Patient is generally conversational, occasionally recognizes family members but typically able to get around the house without "much assistance". -Daughter indicates the patient does on occasion experience hallucinations and mood disorder but has been markedly well controlled over the past few months on current medication treatment -Patient poorly verbal, unable to orient the patient  -Continue home medications including donepezil  History of stroke:  -Continue aspirin.She is usually ambulatory when she is at her baseline.  PT to follow along  GERD: Continue PPI  Diabetes type 2: Continue sliding scale insulin for now. On metformin at home.  Anxiety/depression: Continue current medicines.  Goals of care Lengthy discussion with daughter over the phone. Very difficult conversation with daughter as patient's husband Tracy [name] - the daughter's father) is now worsening in the ICU in critical condition. Will continue current plan with patient as above: oxygen/fluids/advancing diet/medication - likely to discuss palliative care in the next 24-48h if patient does not begin to improve or take adequate PO.  DVT prophylaxis: Lovenox Code Status: Full Family Communication: Daughter updated on phone Disposition Plan: Skilled nursing facility given patient's advanced age, needs, and ambulatory dysfunction.  Antimicrobials:  Anti-infectives (From admission, onward)   None     Subjective: No acute issues or events overnight. Remains confused poorly verbal.  Objective: Vitals:   08/03/19 0400 08/03/19 0428 08/03/19 0456 08/03/19 2130  BP:  112/69  114/76  Pulse:  85  95   Resp:  15  (!) 23  Temp: 98.9 F (37.2 C)   98.3 F (36.8 C)  TempSrc: Oral   Axillary  SpO2:  91%  92%  Weight:   59.8 kg   Height:        Intake/Output Summary (Last 24 hours) at 08/03/2019 5643 Last data filed at 08/03/2019 0500 Gross per 24 hour  Intake 260 ml  Output 150 ml  Net 110 ml   Filed Weights   08/01/19 0500 08/02/19 0444 08/03/19 0456  Weight: 58.5 kg 59.1 kg 59.8 kg    Examination:  General exam: Appears calm and comfortable ,Not in distress,elderly female, pleasantly confused does not follow commands HEENT: Ear/Nose normal on gross exam, mucus membranes appear dry Respiratory system: Bilateral equal air entry, without overt wheezes or crackles  Cardiovascular system: S1 & S2 heard, RRR.  No pedal edema. Gastrointestinal system: Abdomen is nondistended, soft and nontender.  Central nervous system: Alert and awake but not oriented.  No obvious focal neurological deficits Extremities: No edema, no clubbing ,no cyanosis Skin: No rashes, lesions or ulcers,no icterus ,no pallor  Data Reviewed: I have personally reviewed following labs and imaging studies  CBC: Recent Labs  Lab 07/28/19 1155 07/29/19 0506 08/02/19 0045 08/03/19 0030  WBC 4.1 3.6* 11.7* 11.6*  NEUTROABS 2.4  --   --   --   HGB 12.8 11.5* 13.3 13.0  HCT 39.6 35.5* 40.8 39.7  MCV 93.8 92.7 91.3 91.7  PLT 225 198 351 329   Basic Metabolic Panel: Recent Labs  Lab 07/29/19 0506 07/31/19 0424 08/01/19 0037 08/02/19 0045 08/03/19 0030  NA 139 137 139 142 144  K 3.8 4.0 3.9 4.6 4.4  CL 108 102 101 101 105  CO2 22 22 27 27 27   GLUCOSE 82 139* 194* 150* 139*  BUN 9 15 14 15 16   CREATININE 0.85 0.64 0.80 0.73 0.90  CALCIUM 7.7* 8.4* 8.4* 8.6* 8.5*  MG  --  1.5* 1.6*  --   --    GFR: Estimated Creatinine Clearance: 40.7 mL/min (by C-G formula based on SCr of 0.9 mg/dL). Liver Function Tests: Recent Labs  Lab 07/28/19 1155 07/31/19 0424 08/01/19 0037  AST 23 23 28   ALT 13 14 17    ALKPHOS 48 53 53  BILITOT 0.7 0.7 0.4  PROT 6.6 6.7 6.5  ALBUMIN 3.6 3.2* 3.0*   CBG: Recent Labs  Lab 08/02/19 0902 08/02/19 1210 08/02/19 1554 08/02/19 2051 08/02/19 2202  GLUCAP 84 97 101* 153* 147*   Sepsis Labs: Recent Labs  Lab 07/30/19 0410 07/30/19 0642  PROCALCITON 0.24  --   LATICACIDVEN 1.4 1.9    Recent Results (from the past 240 hour(s))  Urine culture     Status: None   Collection Time: 07/28/19 11:00 AM   Specimen: Urine, Clean Catch  Result Value Ref Range Status   Specimen Description   Final    URINE, CLEAN CATCH Performed at [facility], [place] [address] [place], [place] [address]    Special Requests   Final    NONE Performed at [facility], [place] [address], [place], [place] [address]    Culture   Final    NO GROWTH Performed at [facility], [place] [address] [place],  [address]    Report Status 07/29/2019 FINAL  Final  SARS CORONAVIRUS 2 (TAT 6-24 HRS) Nasopharyngeal Nasopharyngeal Swab  Status: Abnormal   Collection Time: 07/28/19 12:46 PM   Specimen: Nasopharyngeal Swab  Result Value Ref Range Status   SARS Coronavirus 2 POSITIVE (A) NEGATIVE Final    Comment: RESULT CALLED TO, READ BACK BY AND VERIFIED WITH: V.JACKSON RN 2026 07/28/2019 MCCORMICK K (NOTE) SARS-CoV-2 target nucleic acids are DETECTED. The SARS-CoV-2 RNA is generally detectable in upper and lower respiratory specimens during the acute phase of infection. Positive results are indicative of active infection with SARS-CoV-2. Clinical  correlation with patient history and other diagnostic information is necessary to determine patient infection status. Positive results do  not rule out bacterial infection or co-infection with other viruses. The expected result is Negative. Fact Sheet for Patients: HairSlick.nohttps://www.fda.gov/media/138098/download Fact Sheet for Healthcare Providers: quierodirigir.comhttps://www.fda.gov/media/138095/download  This test is not yet approved or cleared by the [place] FDA and  has been authorized for detection and/or diagnosis of SARS-CoV-2 by FDA under an Emergency Use Authorization (EUA). This EUA will remain  in effect (meaning this test can be used)  for the duration of the COVID-19 declaration under Section 564(b)(1) of the Act, 21 U.S.C. section 360bbb-3(b)(1), unless the authorization is terminated or revoked sooner. Performed at [facility], [address] [place], [place] [address]     Radiology Studies: No results found.  Scheduled Meds: . aspirin EC  81 mg Oral Daily  . cetaphil  1 application Topical BID  . cholecalciferol  1,000 Units Oral Daily  . dexamethasone (DECADRON) injection  6 mg Intravenous Q24H  . donepezil  10 mg Oral QHS  . doxepin  25 mg Oral BID  . enoxaparin (LOVENOX) injection  40 mg Subcutaneous Q24H  . escitalopram  10 mg Oral Daily  . famotidine  40 mg Oral Daily  . feeding supplement (ENSURE ENLIVE)  237 mL Oral TID BM  . insulin aspart  0-9 Units Subcutaneous TID WC  . multivitamin with minerals  1 tablet Oral Daily  . pantoprazole  40 mg Oral Daily  . spironolactone  25 mg Oral Daily  . cyanocobalamin  1,000 mcg Oral Daily  . vitamin C  500 mg Oral BID  . zinc sulfate  220 mg Oral Daily   Continuous Infusions: . sodium chloride 75 mL/hr at 08/02/19 2008     LOS: 4 days   Time spent: 35 mins  Azucena FallenWilliam C Aquarius Tremper, DO [facility] Pager [phone]  If 7PM-7AM, please contact night-coverage [email or web address] Password [email or web address] 08/03/2019, 8:24 AM

## 2019-08-03 NOTE — Progress Notes (Signed)
Remdesivir - Pharmacy Brief Note   O:  ALT: 17 (11/20) CXR: No edema or consolidation. (11/16) SpO2: 86-96% on room air   A/P:  Remdesivir 200 mg once followed by 100 mg daily x 4 days.   Gretta Arab PharmD, BCPS Clinical pharmacist phone 7am- 5pm: [phone] 08/03/2019 8:26 AM

## 2019-08-04 LAB — CBC
HCT: 39.3 % (ref 36.0–46.0)
Hemoglobin: 12.9 g/dL (ref 12.0–15.0)
MCH: 30.2 pg (ref 26.0–34.0)
MCHC: 32.8 g/dL (ref 30.0–36.0)
MCV: 92 fL (ref 80.0–100.0)
Platelets: 407 10*3/uL — ABNORMAL HIGH (ref 150–400)
RBC: 4.27 MIL/uL (ref 3.87–5.11)
RDW: 13.9 % (ref 11.5–15.5)
WBC: 7.2 10*3/uL (ref 4.0–10.5)
nRBC: 0 % (ref 0.0–0.2)

## 2019-08-04 LAB — COMPREHENSIVE METABOLIC PANEL
ALT: 32 U/L (ref 0–44)
AST: 36 U/L (ref 15–41)
Albumin: 2.7 g/dL — ABNORMAL LOW (ref 3.5–5.0)
Alkaline Phosphatase: 55 U/L (ref 38–126)
Anion gap: 10 (ref 5–15)
BUN: 20 mg/dL (ref 8–23)
CO2: 26 mmol/L (ref 22–32)
Calcium: 8.3 mg/dL — ABNORMAL LOW (ref 8.9–10.3)
Chloride: 107 mmol/L (ref 98–111)
Creatinine, Ser: 0.83 mg/dL (ref 0.44–1.00)
GFR calc Af Amer: >60 mL/min (ref >60)
GFR calc non Af Amer: >60 mL/min (ref >60)
Glucose, Bld: 167 mg/dL — ABNORMAL HIGH (ref 70–99)
Potassium: 4.3 mmol/L (ref 3.5–5.1)
Sodium: 143 mmol/L (ref 135–145)
Total Bilirubin: 0.9 mg/dL (ref 0.3–1.2)
Total Protein: 6.4 g/dL — ABNORMAL LOW (ref 6.5–8.1)

## 2019-08-04 LAB — D-DIMER, QUANTITATIVE: D-Dimer, Quant: 0.78 ug/mL-FEU — ABNORMAL HIGH (ref 0.00–0.50)

## 2019-08-04 LAB — GLUCOSE, CAPILLARY
Glucose-Capillary: 152 mg/dL — ABNORMAL HIGH (ref 70–99)
Glucose-Capillary: 126 mg/dL — ABNORMAL HIGH (ref 70–99)
Glucose-Capillary: 105 mg/dL — ABNORMAL HIGH (ref 70–99)

## 2019-08-04 LAB — C-REACTIVE PROTEIN: CRP: 11.6 mg/dL — ABNORMAL HIGH (ref <1.0)

## 2019-08-04 NOTE — Progress Notes (Signed)
PROGRESS NOTE    Tracy [name]  YSA:[id number] DOB: Nov 08, 1940 DOA: 07/28/2019 PCP: Cassandria Anger, MD   Brief Narrative:  Patient is a 78 year old female with history of advanced dementia, stroke, hypertension, diabetes, depression, anxiety, restless leg syndrome who presented from home with progressive weakness, poor appetite, inability to ambulate.  Patient lives with her family(husband and son).  As per her family, she has progressively declined over last year.  But since last few days, she is more fatigued, has poor appetite.  Family did not report any fever, cough or any respiratory symptoms.  She was hemodynamically stable on presentation.  Covid test positive.  Chest x-ray did not show any pneumonia.  She saturating fine on room air.  We are transferring her to [facility] today.  PT/OT evaluation pending.  Assessment & Plan:  Principal Problem:   COVID-19 virus infection Active Problems:   Weakness   Covid-19, acute infection, transiently hypoxic, POA:  -Continues to have low-grade fevers since admission - resolved -Continue steroids -Continue remdesivir given labs/symptoms/hypoxia - resolving - remdesivir stop date 11/26 -X-ray personally reviewed, without overt infiltrate -Continue supportive care.  Tylenol for fever. Continue zinc and vitamin C.   -Continue daily labs -Pt now on room air  Recent Labs    08/02/19 0045 08/03/19 0030 08/04/19 0145  DDIMER 0.54* 0.80* 0.78*  CRP 9.6* 13.2* 11.6*    Fatigue, poor appetite, difficulty ambulation, failure to thrive 2/2 above, POA; Severe protein caloric malnutrition, POA -Most likely acute on chronic as symptoms are 2/2 Covid infection. -Per family patient has been progressively declining over months/year.   -Continue supportive care. IV fluids until PO intake improves -PT/OT recommending SNF -Diet improving slightly - extremely poor PO intake at home at baseline; will not offer; feeding tube given  mental status and advanced age.  Acute metabolic encephalopathy on chronic advanced dementia, improving: -Further discussion with family - patient is close to baseline per daughter over phone -Patient is generally conversational, occasionally recognizes family members but typically able to get around the house without "much assistance" using walker/cane. -Daughter indicates the patient does on occasion experience hallucinations and mood disorder but has been markedly well controlled over the past few months on current medication treatment -Patient poorly verbal, unable to orient the patient  -Continue home medications including donepezil as able to tolerate  History of stroke:  -Continue aspirin.She is usually ambulatory when she is at her baseline.  PT to follow along  GERD: Continue PPI  Diabetes type 2: Continue sliding scale insulin for now. On metformin at home.  Anxiety/depression: Continue current medicines.  Goals of care Lengthy discussion with daughter over the phone. Very difficult conversation with daughter as patient's husband Kris Mouton - the daughter's father) is now worsening in the ICU in critical condition. Will continue current plan with patient as above: oxygen/fluids/advancing diet/medication - likely to discuss palliative care in the next 24-48h if patient does not begin to improve or take adequate PO.  DVT prophylaxis: Lovenox Code Status: Full Family Communication: Daughter updated on phone Disposition Plan: Skilled nursing facility given patient's advanced age, needs, and ambulatory dysfunction.  Antimicrobials:  Anti-infectives (From admission, onward)   Start     Dose/Rate Route Frequency Ordered Stop   08/04/19 1000  remdesivir 100 mg in sodium chloride 0.9 % 250 mL IVPB     100 mg 500 mL/hr over 30 Minutes Intravenous Every 24 hours 08/03/19 0830 08/08/19 0959   08/03/19 1000  remdesivir 200 mg in  sodium chloride 0.9 % 250 mL IVPB     200 mg 500 mL/hr over  30 Minutes Intravenous Once 08/03/19 0830 08/03/19 1115     Subjective: No acute issues or events overnight. Remains confused poorly verbal.  Objective: Vitals:   08/03/19 1927 08/04/19 0425 08/04/19 0600 08/04/19 0745  BP: (!) 112/57 121/79  122/75  Pulse: 97 64 73 64  Resp: 20 13 17 19   Temp: 98.2 F (36.8 C) 97.9 F (36.6 C)  98.6 F (37 C)  TempSrc: Oral Oral  Axillary  SpO2: 94% 94% 95% 94%  Weight:      Height:        Intake/Output Summary (Last 24 hours) at 08/04/2019 0819 Last data filed at 08/04/2019 0600 Gross per 24 hour  Intake 2082.6 ml  Output 250 ml  Net 1832.6 ml   Filed Weights   08/01/19 0500 08/02/19 0444 08/03/19 0456  Weight: 58.5 kg 59.1 kg 59.8 kg    Examination:  General exam: Appears calm and comfortable ,Not in distress,elderly female, pleasantly confused does not follow commands HEENT: Ear/Nose normal on gross exam, mucus membranes appear dry Respiratory system: Bilateral equal air entry, without overt wheezes or crackles  Cardiovascular system: S1 & S2 heard, RRR.  No pedal edema. Gastrointestinal system: Abdomen is nondistended, soft and nontender.  Central nervous system: Alert and awake but not oriented.  No obvious focal neurological deficits Extremities: No edema, no clubbing ,no cyanosis Skin: No rashes, lesions or ulcers,no icterus ,no pallor  Data Reviewed: I have personally reviewed following labs and imaging studies  CBC: Recent Labs  Lab 07/28/19 1155 07/29/19 0506 08/02/19 0045 08/03/19 0030 08/04/19 0145  WBC 4.1 3.6* 11.7* 11.6* 7.2  NEUTROABS 2.4  --   --   --   --   HGB 12.8 11.5* 13.3 13.0 12.9  HCT 39.6 35.5* 40.8 39.7 39.3  MCV 93.8 92.7 91.3 91.7 92.0  PLT 225 198 351 361 407*   Basic Metabolic Panel: Recent Labs  Lab 07/31/19 0424 08/01/19 0037 08/02/19 0045 08/03/19 0030 08/04/19 0145  NA 137 139 142 144 143  K 4.0 3.9 4.6 4.4 4.3  CL 102 101 101 105 107  CO2 22 27 27 27 26   GLUCOSE 139* 194*  150* 139* 167*  BUN 15 14 15 16 20   CREATININE 0.64 0.80 0.73 0.90 0.83  CALCIUM 8.4* 8.4* 8.6* 8.5* 8.3*  MG 1.5* 1.6*  --   --   --    GFR: Estimated Creatinine Clearance: 44.2 mL/min (by C-G formula based on SCr of 0.83 mg/dL). Liver Function Tests: Recent Labs  Lab 07/28/19 1155 07/31/19 0424 08/01/19 0037 08/04/19 0145  AST 23 23 28  36  ALT 13 14 17  32  ALKPHOS 48 53 53 55  BILITOT 0.7 0.7 0.4 0.9  PROT 6.6 6.7 6.5 6.4*  ALBUMIN 3.6 3.2* 3.0* 2.7*   CBG: Recent Labs  Lab 08/02/19 2051 08/02/19 2202 08/03/19 0714 08/03/19 1058 08/03/19 1551  GLUCAP 153* 147* 98 91 123*   Sepsis Labs: Recent Labs  Lab 07/30/19 0410 07/30/19 0642  PROCALCITON 0.24  --   LATICACIDVEN 1.4 1.9    Recent Results (from the past 240 hour(s))  Urine culture     Status: None   Collection Time: 07/28/19 11:00 AM   Specimen: Urine, Clean Catch  Result Value Ref Range Status   Specimen Description   Final    URINE, CLEAN CATCH Performed at [facility], [address]  [place], [place] [address]    Special Requests   Final    NONE Performed at [facility], [address], [place], [place] [address]    Culture   Final    NO GROWTH Performed at [facility], [address] [place], [place] [address]    Report Status 07/29/2019 FINAL  Final  SARS CORONAVIRUS 2 (TAT 6-24 HRS) Nasopharyngeal Nasopharyngeal Swab     Status: Abnormal   Collection Time: 07/28/19 12:46 PM   Specimen: Nasopharyngeal Swab  Result Value Ref Range Status   SARS Coronavirus 2 POSITIVE (A) NEGATIVE Final    Comment: RESULT CALLED TO, READ BACK BY AND VERIFIED WITH: V.JACKSON RN 2026 07/28/2019 MCCORMICK K (NOTE) SARS-CoV-2 target nucleic acids are DETECTED. The SARS-CoV-2 RNA is generally detectable in upper and lower respiratory specimens during the acute phase of infection. Positive results are indicative of active infection with SARS-CoV-2. Clinical   correlation with patient history and other diagnostic information is necessary to determine patient infection status. Positive results do  not rule out bacterial infection or co-infection with other viruses. The expected result is Negative. Fact Sheet for Patients: HairSlick.no Fact Sheet for Healthcare Providers: quierodirigir.com This test is not yet approved or cleared by the [place] FDA and  has been authorized for detection and/or diagnosis of SARS-CoV-2 by FDA under an Emergency Use Authorization (EUA). This EUA will remain  in effect (meaning this test can be used)  for the duration of the COVID-19 declaration under Section 564(b)(1) of the Act, 21 U.S.C. section 360bbb-3(b)(1), unless the authorization is terminated or revoked sooner. Performed at [facility], [address] [place], [place] [address]     Radiology Studies: No results found.  Scheduled Meds: . aspirin EC  81 mg Oral Daily  . cetaphil  1 application Topical BID  . cholecalciferol  1,000 Units Oral Daily  . dexamethasone (DECADRON) injection  6 mg Intravenous Q24H  . donepezil  10 mg Oral QHS  . doxepin  25 mg Oral BID  . enoxaparin (LOVENOX) injection  40 mg Subcutaneous Q24H  . escitalopram  10 mg Oral Daily  . famotidine  40 mg Oral Daily  . feeding supplement (ENSURE ENLIVE)  237 mL Oral TID BM  . insulin aspart  0-9 Units Subcutaneous TID WC  . multivitamin with minerals  1 tablet Oral Daily  . pantoprazole  40 mg Oral Daily  . spironolactone  25 mg Oral Daily  . cyanocobalamin  1,000 mcg Oral Daily  . vitamin C  500 mg Oral BID  . zinc sulfate  220 mg Oral Daily   Continuous Infusions: . sodium chloride 75 mL/hr at 08/04/19 0600  . remdesivir 100 mg in NS 250 mL       LOS: 5 days   Time spent: 35 mins  Azucena Fallen, DO [facility] Pager [phone]  If 7PM-7AM, please contact night-coverage  [email or web address] Password [facility] 08/04/2019, 8:19 AM

## 2019-08-04 NOTE — Progress Notes (Signed)
   08/04/19 1330  Family/Significant Other Communication  Family/Significant Other Update Called;Updated (daughter Sunday Spillers)

## 2019-08-04 NOTE — Progress Notes (Addendum)
CBG 133 at this time, glucose monitoring machine unable to recognize pt. Pt identification verified by this rn.

## 2019-08-05 DIAGNOSIS — L8990 Pressure ulcer of unspecified site, unspecified stage: Secondary | ICD-10-CM | POA: Insufficient documentation

## 2019-08-05 LAB — GLUCOSE, CAPILLARY
Glucose-Capillary: 138 mg/dL — ABNORMAL HIGH (ref 70–99)
Glucose-Capillary: 133 mg/dL — ABNORMAL HIGH (ref 70–99)
Glucose-Capillary: 93 mg/dL (ref 70–99)
Glucose-Capillary: 79 mg/dL (ref 70–99)
Glucose-Capillary: 88 mg/dL (ref 70–99)
Glucose-Capillary: 96 mg/dL (ref 70–99)
Glucose-Capillary: 119 mg/dL — ABNORMAL HIGH (ref 70–99)

## 2019-08-05 LAB — COMPREHENSIVE METABOLIC PANEL
ALT: 31 U/L (ref 0–44)
AST: 35 U/L (ref 15–41)
Albumin: 2.9 g/dL — ABNORMAL LOW (ref 3.5–5.0)
Alkaline Phosphatase: 51 U/L (ref 38–126)
Anion gap: 11 (ref 5–15)
BUN: 15 mg/dL (ref 8–23)
CO2: 23 mmol/L (ref 22–32)
Calcium: 8.1 mg/dL — ABNORMAL LOW (ref 8.9–10.3)
Chloride: 105 mmol/L (ref 98–111)
Creatinine, Ser: 0.69 mg/dL (ref 0.44–1.00)
GFR calc Af Amer: >60 mL/min (ref >60)
GFR calc non Af Amer: >60 mL/min (ref >60)
Glucose, Bld: 99 mg/dL (ref 70–99)
Potassium: 3.1 mmol/L — ABNORMAL LOW (ref 3.5–5.1)
Sodium: 139 mmol/L (ref 135–145)
Total Bilirubin: 1.2 mg/dL (ref 0.3–1.2)
Total Protein: 6.5 g/dL (ref 6.5–8.1)

## 2019-08-05 LAB — CBC
HCT: 38.1 % (ref 36.0–46.0)
Hemoglobin: 12.8 g/dL (ref 12.0–15.0)
MCH: 30.1 pg (ref 26.0–34.0)
MCHC: 33.6 g/dL (ref 30.0–36.0)
MCV: 89.6 fL (ref 80.0–100.0)
Platelets: 521 10*3/uL — ABNORMAL HIGH (ref 150–400)
RBC: 4.25 MIL/uL (ref 3.87–5.11)
RDW: 13.6 % (ref 11.5–15.5)
WBC: 8 10*3/uL (ref 4.0–10.5)
nRBC: 0 % (ref 0.0–0.2)

## 2019-08-05 LAB — D-DIMER, QUANTITATIVE: D-Dimer, Quant: 6.02 ug/mL-FEU — ABNORMAL HIGH (ref 0.00–0.50)

## 2019-08-05 LAB — C-REACTIVE PROTEIN: CRP: 4.2 mg/dL — ABNORMAL HIGH (ref <1.0)

## 2019-08-05 MED ORDER — DEXAMETHASONE 6 MG PO TABS
6.0000 mg | ORAL_TABLET | Freq: Every day | ORAL | Status: DC
  Administered 2019-08-05 – 2019-08-06 (×2): 6 mg via ORAL
  Filled 2019-08-05 (×2): qty 1

## 2019-08-05 MED ORDER — DEXAMETHASONE 6 MG PO TABS: 6.0000 mg | ORAL_TABLET | Freq: Every day | ORAL | Status: DC

## 2019-08-05 NOTE — Plan of Care (Signed)
  Problem: Nutrition: Goal: Adequate nutrition will be maintained Outcome: Progressing   Problem: Safety: Goal: Ability to remain free from injury will improve Outcome: Progressing   Problem: Skin Integrity: Goal: Risk for impaired skin integrity will decrease Outcome: Progressing   Problem: Education: Goal: Knowledge of General Education information will improve Description: Including pain rating scale, medication(s)/side effects and non-pharmacologic comfort measures Outcome: Not Progressing   Problem: Health Behavior/Discharge Planning: Goal: Ability to manage health-related needs will improve Outcome: Not Progressing

## 2019-08-05 NOTE — Progress Notes (Signed)
Pt is active this hs, attempting to get oob on own/confused. Bed alarm for safety and pt room close to nursing station. Pt said "Im hungry", ate 25% of lean cuisine and approx 144ml chocolate boost with assist.

## 2019-08-05 NOTE — Progress Notes (Signed)
PROGRESS NOTE    Tracy [name]  HUD:[id number] DOB: Aug 07, 1941 DOA: 07/28/2019 PCP: Tresa Garter, MD   Brief Narrative:  Patient is a 78 year old female with history of advanced dementia, stroke, hypertension, diabetes, depression, anxiety, restless leg syndrome who presented from home with progressive weakness, poor appetite, inability to ambulate.  Patient lives with her family(husband and son).  As per her family, she has progressively declined over last year.  But since last few days, she is more fatigued, has poor appetite.  Family did not report any fever, cough or any respiratory symptoms.  She was hemodynamically stable on presentation.  Covid test positive.  Chest x-ray did not show any pneumonia.  She saturating fine on room air.  We are transferring her to [facility] today.  PT/OT evaluation pending.  Assessment & Plan:  Principal Problem:   COVID-19 virus infection Active Problems:   Weakness   Pressure injury of skin   Covid-19, acute infection, transiently hypoxic, POA:  -Low-grade fevers at admission - now resolved -Continue steroids; started 07/30/19 x 10 days -Continue remdesivir given labs/symptoms/hypoxia - resolving - remdesivir stop date 11/26 -X-ray personally reviewed, without overt infiltrate -Continue supportive care.  Tylenol for fever. Continue zinc and vitamin C.   -Continue daily labs -Pt now on room air x48h Recent Labs    08/03/19 0030 08-26-2019 0145  DDIMER 0.80* 0.78*  CRP 13.2* 11.6*    Fatigue, poor appetite, difficulty ambulation, failure to thrive 2/2 above, POA; Severe protein caloric malnutrition, POA -Most likely acute on chronic as symptoms are 2/2 Covid infection. -Per family patient has been progressively declining over months/year.   -Continue supportive care. IV fluids until PO intake improves -PT/OT recommending SNF -Diet improving slightly - extremely poor PO intake at home at baseline; will not offer; feeding  tube given mental status and advanced age.  Acute metabolic encephalopathy on chronic advanced dementia, improving: -Further discussion with family - patient is close to baseline per daughter over phone -Patient is generally conversational, occasionally recognizes family members but typically able to get around the house without "much assistance" using walker/cane. -Daughter indicates the patient does on occasion experience hallucinations and mood disorder but has been markedly well controlled over the past few months on current medication treatment -Patient very quiet/weak but conversational today - more alert -Continue home medications including donepezil  History of stroke:  -Continue aspirin.She is usually ambulatory when she is at her baseline.  PT to follow along  GERD: Continue PPI  Diabetes type 2: Continue sliding scale insulin for now. On metformin at home.  Anxiety/depression: Continue current medicines.  Pressure injury of skin Likely POA, see nursing/wound eval for details  Goals of care Lengthy discussion with daughter over the phone. Very difficult conversation with daughter as patient's husband Primitivo Gauze - the daughter's father) recently died from Covid on 08-26-19 at [facility]. Will continue current plan with patient as above: oxygen/fluids/advancing diet/medication - given modest improvement in symptoms will continue current course. Certainly reasonable to discuss palliative care if patient does not continue to improve.  DVT prophylaxis: Lovenox Code Status: Full Family Communication: Daughter updated on phone Disposition Plan: Skilled nursing facility given patient's advanced age, needs, and ambulatory dysfunction.  Antimicrobials:  Anti-infectives (From admission, onward)   Start     Dose/Rate Route Frequency Ordered Stop   08/26/2019 1000  remdesivir 100 mg in sodium chloride 0.9 % 250 mL IVPB     100 mg 500 mL/hr over 30 Minutes Intravenous Every 24  hours 08/03/19 0830  08/08/19 0959   08/03/19 1000  remdesivir 200 mg in sodium chloride 0.9 % 250 mL IVPB     200 mg 500 mL/hr over 30 Minutes Intravenous Once 08/03/19 0830 08/03/19 1115     Subjective: No acute issues or events overnight. Remains confused poorly verbal.  Objective: Vitals:   08/04/19 0745 08/04/19 1607 08/04/19 2126 08/05/19 0335  BP: 122/75 135/73 109/74 (!) 142/78  Pulse: 64 99 85 76  Resp: 19 20 20 18   Temp: 98.6 F (37 C) 97.6 F (36.4 C) 98.2 F (36.8 C) 97.9 F (36.6 C)  TempSrc: Axillary Oral Oral Axillary  SpO2: 94% 90% 92% 96%  Weight:    61.2 kg  Height:        Intake/Output Summary (Last 24 hours) at 08/05/2019 0801 Last data filed at 08/05/2019 0342 Gross per 24 hour  Intake 170 ml  Output 250 ml  Net -80 ml   Filed Weights   08/02/19 0444 08/03/19 0456 08/05/19 0335  Weight: 59.1 kg 59.8 kg 61.2 kg    Examination:  General exam: Appears calm and comfortable ,Not in distress,elderly female, pleasantly confused; alert to person today. HEENT: Ear/Nose normal on gross exam, mucus membranes appear dry Respiratory system: Bilateral equal air entry, without overt wheezes or crackles  Cardiovascular system: S1 & S2 heard, RRR.  No pedal edema. Gastrointestinal system: Abdomen is nondistended, soft and nontender.  Central nervous system: Alert and awake but not oriented.  No obvious focal neurological deficits Extremities: No edema, no clubbing ,no cyanosis Skin: Pressure injury of skin as per nursing notes  Data Reviewed: I have personally reviewed following labs and imaging studies  CBC: Recent Labs  Lab 08/02/19 0045 08/03/19 0030 08/04/19 0145  WBC 11.7* 11.6* 7.2  HGB 13.3 13.0 12.9  HCT 40.8 39.7 39.3  MCV 91.3 91.7 92.0  PLT 351 361 407*   Basic Metabolic Panel: Recent Labs  Lab 07/31/19 0424 08/01/19 0037 08/02/19 0045 08/03/19 0030 08/04/19 0145  NA 137 139 142 144 143  K 4.0 3.9 4.6 4.4 4.3  CL 102 101 101 105 107  CO2 22 27 27 27  26   GLUCOSE 139* 194* 150* 139* 167*  BUN 15 14 15 16 20   CREATININE 0.64 0.80 0.73 0.90 0.83  CALCIUM 8.4* 8.4* 8.6* 8.5* 8.3*  MG 1.5* 1.6*  --   --   --    GFR: Estimated Creatinine Clearance: 48.1 mL/min (by C-G formula based on SCr of 0.83 mg/dL). Liver Function Tests: Recent Labs  Lab 07/31/19 0424 08/01/19 0037 08/04/19 0145  AST 23 28 36  ALT 14 17 32  ALKPHOS 53 53 55  BILITOT 0.7 0.4 0.9  PROT 6.7 6.5 6.4*  ALBUMIN 3.2* 3.0* 2.7*   CBG: Recent Labs  Lab 08/03/19 0714 08/03/19 1058 08/03/19 1551 08/04/19 0752 08/04/19 1248  GLUCAP 98 91 123* 126* 105*   Sepsis Labs: Recent Labs  Lab 07/30/19 0410 07/30/19 0642  PROCALCITON 0.24  --   LATICACIDVEN 1.4 1.9    Recent Results (from the past 240 hour(s))  Urine culture     Status: None   Collection Time: 07/28/19 11:00 AM   Specimen: Urine, Clean Catch  Result Value Ref Range Status   Specimen Description   Final    URINE, CLEAN CATCH Performed at [facility], [address] [place], [place] [id number]    Special Requests   Final    NONE Performed at [facility], [place] [address], [place], [place] [address]    Culture   Final    NO GROWTH Performed at [facility], [place] [address] [place], [place] [address]    Report Status 07/29/2019 FINAL  Final  SARS CORONAVIRUS 2 (TAT 6-24 HRS) Nasopharyngeal Nasopharyngeal Swab     Status: Abnormal   Collection Time: 07/28/19 12:46 PM   Specimen: Nasopharyngeal Swab  Result Value Ref Range Status   SARS Coronavirus 2 POSITIVE (A) NEGATIVE Final    Comment: RESULT CALLED TO, READ BACK BY AND VERIFIED WITH: V.JACKSON RN 2026 07/28/2019 MCCORMICK K (NOTE) SARS-CoV-2 target nucleic acids are DETECTED. The SARS-CoV-2 RNA is generally detectable in upper and lower respiratory specimens during the acute phase of infection. Positive results are indicative of active infection with SARS-CoV-2. Clinical  correlation with  patient history and other diagnostic information is necessary to determine patient infection status. Positive results do  not rule out bacterial infection or co-infection with other viruses. The expected result is Negative. Fact Sheet for Patients: SugarRoll.be Fact Sheet for Healthcare Providers: https://www.woods-mathews.com/ This test is not yet approved or cleared by the [place] FDA and  has been authorized for detection and/or diagnosis of SARS-CoV-2 by FDA under an Emergency Use Authorization (EUA). This EUA will remain  in effect (meaning this test can be used)  for the duration of the COVID-19 declaration under Section 564(b)(1) of the Act, 21 U.S.C. section 360bbb-3(b)(1), unless the authorization is terminated or revoked sooner. Performed at [facility], [place] [address] [place], [place] [address]     Radiology Studies: No results found.  Scheduled Meds: . aspirin EC  81 mg Oral Daily  . cetaphil  1 application Topical BID  . cholecalciferol  1,000 Units Oral Daily  . dexamethasone (DECADRON) injection  6 mg Intravenous Q24H  . donepezil  10 mg Oral QHS  . doxepin  25 mg Oral BID  . enoxaparin (LOVENOX) injection  40 mg Subcutaneous Q24H  . escitalopram  10 mg Oral Daily  . famotidine  40 mg Oral Daily  . feeding supplement (ENSURE ENLIVE)  237 mL Oral TID BM  . insulin aspart  0-9 Units Subcutaneous TID WC  . multivitamin with minerals  1 tablet Oral Daily  . pantoprazole  40 mg Oral Daily  . spironolactone  25 mg Oral Daily  . cyanocobalamin  1,000 mcg Oral Daily  . vitamin C  500 mg Oral BID  . zinc sulfate  220 mg Oral Daily   Continuous Infusions: . sodium chloride 75 mL/hr at 08/04/19 0600  . remdesivir 100 mg in NS 250 mL 100 mg (08/04/19 0907)     LOS: 6 days   Time spent: 35 mins  Little Ishikawa, DO [facility] Pager [phone]  If 7PM-7AM, please contact night-coverage  [email or web address] Password [email or web address] 08/05/2019, 8:01 AM

## 2019-08-05 NOTE — Progress Notes (Addendum)
This RN and patient facetimed with Sunday Spillers (pt daughter). Daughter states to offer pt chicken for meal choices.  Updates provided, no further questions at this time.

## 2019-08-05 NOTE — Progress Notes (Signed)
   08/05/19 0335  Provider Notification  Provider Name/Title Dr Shanon Brow  Date Provider Notified 08/05/19  Time Provider Notified [phone]  Notification Type Page  Notification Reason Other (Comment) (pt freq attempts oob, confused, combative)

## 2019-08-05 NOTE — Progress Notes (Addendum)
1710: Pt arrived on unit. Confused, somewhat directable. Purewick in place. Bed low and bed alarm on. Reminded to call for assistance and call bell placed near Pt.   1800: Changed, repositioned, bed alarm on

## 2019-08-05 NOTE — Progress Notes (Signed)
Pt pulled IV out. 2 nurse attempt to start new IV, unsuccessful. Notified MD and placed IV consult.

## 2019-08-05 NOTE — Progress Notes (Signed)
Called and spoke with patient's daughter, Sunday Spillers.  Updated provided and all questions and concerns addressed.  Daughter was able to speak with mother via phone.  Earleen Reaper RN

## 2019-08-06 ENCOUNTER — Encounter: Payer: Self-pay | Admitting: Internal Medicine

## 2019-08-06 ENCOUNTER — Telehealth: Payer: Self-pay | Admitting: Internal Medicine

## 2019-08-06 DIAGNOSIS — E1169 Type 2 diabetes mellitus with other specified complication: Secondary | ICD-10-CM

## 2019-08-06 LAB — CBC
HCT: 36.3 % (ref 36.0–46.0)
Hemoglobin: 12.1 g/dL (ref 12.0–15.0)
MCH: 29.7 pg (ref 26.0–34.0)
MCHC: 33.3 g/dL (ref 30.0–36.0)
MCV: 89.2 fL (ref 80.0–100.0)
Platelets: 535 10*3/uL — ABNORMAL HIGH (ref 150–400)
RBC: 4.07 MIL/uL (ref 3.87–5.11)
RDW: 13.6 % (ref 11.5–15.5)
WBC: 6.7 10*3/uL (ref 4.0–10.5)
nRBC: 0 % (ref 0.0–0.2)

## 2019-08-06 LAB — COMPREHENSIVE METABOLIC PANEL
ALT: 36 U/L (ref 0–44)
AST: 35 U/L (ref 15–41)
Albumin: 2.6 g/dL — ABNORMAL LOW (ref 3.5–5.0)
Alkaline Phosphatase: 56 U/L (ref 38–126)
Anion gap: 11 (ref 5–15)
BUN: 14 mg/dL (ref 8–23)
CO2: 27 mmol/L (ref 22–32)
Calcium: 8.3 mg/dL — ABNORMAL LOW (ref 8.9–10.3)
Chloride: 104 mmol/L (ref 98–111)
Creatinine, Ser: 0.66 mg/dL (ref 0.44–1.00)
GFR calc Af Amer: >60 mL/min (ref >60)
GFR calc non Af Amer: >60 mL/min (ref >60)
Glucose, Bld: 101 mg/dL — ABNORMAL HIGH (ref 70–99)
Potassium: 3.3 mmol/L — ABNORMAL LOW (ref 3.5–5.1)
Sodium: 142 mmol/L (ref 135–145)
Total Bilirubin: 0.9 mg/dL (ref 0.3–1.2)
Total Protein: 6.1 g/dL — ABNORMAL LOW (ref 6.5–8.1)

## 2019-08-06 LAB — C-REACTIVE PROTEIN: CRP: 3.6 mg/dL — ABNORMAL HIGH (ref <1.0)

## 2019-08-06 LAB — GLUCOSE, CAPILLARY
Glucose-Capillary: 93 mg/dL (ref 70–99)
Glucose-Capillary: 129 mg/dL — ABNORMAL HIGH (ref 70–99)
Glucose-Capillary: 159 mg/dL — ABNORMAL HIGH (ref 70–99)
Glucose-Capillary: 148 mg/dL — ABNORMAL HIGH (ref 70–99)
Glucose-Capillary: 201 mg/dL — ABNORMAL HIGH (ref 70–99)

## 2019-08-06 LAB — D-DIMER, QUANTITATIVE: D-Dimer, Quant: 0.51 ug/mL-FEU — ABNORMAL HIGH (ref 0.00–0.50)

## 2019-08-06 MED ORDER — POTASSIUM CHLORIDE CRYS ER 20 MEQ PO TBCR
40.0000 meq | EXTENDED_RELEASE_TABLET | Freq: Once | ORAL | Status: AC
  Administered 2019-08-06: 40 meq via ORAL
  Filled 2019-08-06: qty 2

## 2019-08-06 MED ORDER — DEXAMETHASONE 4 MG PO TABS
4.0000 mg | ORAL_TABLET | Freq: Every day | ORAL | Status: AC
  Administered 2019-08-07: 4 mg via ORAL
  Filled 2019-08-06: qty 1

## 2019-08-06 NOTE — Progress Notes (Signed)
Rapid Response Event Note  Overview: PCU charge RN paged this RN and stated pt in 156 very lethargic and BP in 70s      Initial Focused Assessment: Pt only responsive to stimulation, but will not open her eyes or follow commands. O2 sats on room air 90's. BP 90's but NS fluids running wide open for 10 mins prior   Interventions: 400 cc of NS delivered. Fluids turned off due to BP 108/74 MAP 84. HR 80s NSR. Manual BP taken per MD order 120/90. MD did not come to bedside or put in any additional orders.   Plan of Care (if not transferred): Continue to assess pt Q1hr. BP Q1hr.     Tracy [name]

## 2019-08-06 NOTE — Progress Notes (Signed)
Family update: Spoke with patient's daughter. Patient is very sleepy today, poor appetite, and disoriented.

## 2019-08-06 NOTE — Telephone Encounter (Signed)
na

## 2019-08-06 NOTE — Progress Notes (Signed)
PROGRESS NOTE                                                                                                                                                                                                             Patient Demographics:    Tracy [name], is a 78 y.o. female, DOB - Jun 13, 1941, IHW:[id number]  Outpatient Primary MD for the patient is Plotnikov, Evie Lacks, MD   Admit date - 07/28/2019   LOS - 7  No chief complaint on file.      Brief Narrative: Patient is a 78 y.o. female with PMHx of advanced dementia, depression, anxiety, DM-2, restless leg syndrome-hypoxia secondary to COVID-19 pneumonia.  She was treated with steroids and remdesivir with improvement in her hypoxemia-however hospital course now complicated by superimposed encephalopathy and delirium and failure to thrive syndrome.  See below for further details   Subjective:    Meridee Score today    Assessment  & Plan :   Acute Hypoxic Resp Failure due to Covid 19 Viral pneumonia: Improved-on room air.  Treated with steroids/remdesivir.  Continue supportive care.  Fever: afebrile  O2 requirements:  SpO2: 98 % O2 Flow Rate (L/min): 0 L/min   COVID-19 Labs: Recent Labs    08/04/19 0145 08/05/19 1128 08/06/19 0530  DDIMER 0.78* 6.02* 0.51*  CRP 11.6* 4.2* 3.6*    Lab Results  Component Value Date   SARSCOV2NAA POSITIVE (A) 07/28/2019     COVID-19 Medications: Steroids: 11/18>> Remdesivir: 11/22>> Actemra:not given Convalescent Plasma:not given  Other medications: Diuretics:Euvolemic-no need for lasix Antibiotics:Not needed as no evidence of bacterial infection  Prone/Incentive Spirometry: not cooperative given dementia  DVT Prophylaxis  :  Lovenox   Acute metabolic encephalopathy superimposed on advanced dementia: Metabolic encephalopathy likely secondary to COVID-19-she is pleasantly confused.  Continue Aricept-if  confusion/agitation worsens-we can try low-dose Risperdal at night.  Failure to thrive syndrome/poor appetite: Worsened by acute illness-suspect has significant amount of frailty at baseline.  Continue supportive care-do not think she is a good candidate for enteral/tube feeding given agitation/confusion.  Continue to engage family- do not think patient is a candidate for aggressive care.  DM-2: CBG stable with SSI-follow  CBG (last 3)  Recent Labs    08/05/19 2141 08/06/19 0729 08/06/19 1225  GLUCAP 119* 93 129*   GERD: Continue PPI  History of CVA:  Continue aspirin  Anxiety/depression: Continue doxepin, Lexapro  Nutrition Problem: Nutrition Problem: Increased nutrient needs Etiology: acute illness(COVID-19) Signs/Symptoms: estimated needs Interventions: Ensure Enlive (each supplement provides 350kcal and 20 grams of protein), MVI  RN pressure injury documentation: Pressure Injury 08/04/19 Coccyx Mid Stage II -  Partial thickness loss of dermis presenting as a shallow open ulcer with a red, pink wound bed without slough. (Active)  08/04/19 2000  Location: Coccyx  Location Orientation: Mid  Staging: Stage II -  Partial thickness loss of dermis presenting as a shallow open ulcer with a red, pink wound bed without slough.  Wound Description (Comments):   Present on Admission:    Consults  :  None  Procedures  :  None  ABG:    Component Value Date/Time   TCO2 29 08/08/2008 1556    Vent Settings: N/A  Condition - Extremely Guarded  Family Communication  : Daughter updated over the phone  Code Status :  Full Code  Diet :  Diet Order            Diet regular Room service appropriate? Yes; Fluid consistency: Thin  Diet effective now              Disposition Plan  :  Remain hospitalized-SNF on discharge  Barriers to discharge: Hypoxia requiring O2 supplementation/complete 5 days of IV Remdesivir  Antimicorbials  :    Anti-infectives (From admission, onward)    Start     Dose/Rate Route Frequency Ordered Stop   08/04/19 1000  remdesivir 100 mg in sodium chloride 0.9 % 250 mL IVPB     100 mg 500 mL/hr over 30 Minutes Intravenous Every 24 hours 08/03/19 0830 08/08/19 0959   08/03/19 1000  remdesivir 200 mg in sodium chloride 0.9 % 250 mL IVPB     200 mg 500 mL/hr over 30 Minutes Intravenous Once 08/03/19 0830 08/03/19 1115      Inpatient Medications  Scheduled Meds: . aspirin EC  81 mg Oral Daily  . cetaphil  1 application Topical BID  . cholecalciferol  1,000 Units Oral Daily  . dexamethasone  6 mg Oral Daily  . donepezil  10 mg Oral QHS  . doxepin  25 mg Oral BID  . enoxaparin (LOVENOX) injection  40 mg Subcutaneous Q24H  . escitalopram  10 mg Oral Daily  . famotidine  40 mg Oral Daily  . feeding supplement (ENSURE ENLIVE)  237 mL Oral TID BM  . insulin aspart  0-9 Units Subcutaneous TID WC  . multivitamin with minerals  1 tablet Oral Daily  . pantoprazole  40 mg Oral Daily  . spironolactone  25 mg Oral Daily  . cyanocobalamin  1,000 mcg Oral Daily  . vitamin C  500 mg Oral BID  . zinc sulfate  220 mg Oral Daily   Continuous Infusions: . sodium chloride 75 mL/hr at 08/05/19 2203  . remdesivir 100 mg in NS 250 mL 100 mg (08/05/19 1008)   PRN Meds:.acetaminophen, acetaminophen, bisacodyl, lubiprostone, meclizine, polyethylene glycol   Time Spent in minutes  25  See all Orders from today for further details   Jeoffrey Massed M.D on 08/06/2019 at 9:09 AM  To page go to [email or web address] - use universal password  [facility] -  Office  [phone]    Objective:   Vitals:   08/06/19 0300 08/06/19 0500 08/06/19 0600 08/06/19 0731  BP: 133/90 126/79 125/73 130/73  Pulse: 96 86 76 86  Resp: 20   19  Temp:  97.7 F (36.5 C)   98.7 F (37.1 C)  TempSrc: Axillary   Axillary  SpO2: 97% 97% 97% 98%  Weight:      Height:        Wt Readings from Last 3 Encounters:  08/05/19 61.2 kg  07/09/19 64 kg  03/24/19 66.2 kg      Intake/Output Summary (Last 24 hours) at 08/06/2019 [id number] Last data filed at 08/06/2019 0600 Gross per 24 hour  Intake 2943.35 ml  Output 1600 ml  Net 1343.35 ml     Physical Exam Gen Exam: Awake-very confused-opens eyes-tells me her name but then mumbles incoherently. HEENT:atraumatic, normocephalic Chest: B/L clear to auscultation anteriorly CVS:S1S2 regular Abdomen:soft non tender, non distended Extremities:no edema Neurology: Seems to move all 4 extremities-difficult exam Skin: no rash   Data Review:    CBC Recent Labs  Lab 08/02/19 0045 08/03/19 0030 08/04/19 0145 08/05/19 1128 08/06/19 0530  WBC 11.7* 11.6* 7.2 8.0 6.7  HGB 13.3 13.0 12.9 12.8 12.1  HCT 40.8 39.7 39.3 38.1 36.3  PLT 351 361 407* 521* 535*  MCV 91.3 91.7 92.0 89.6 89.2  MCH 29.8 30.0 30.2 30.1 29.7  MCHC 32.6 32.7 32.8 33.6 33.3  RDW 13.7 13.6 13.9 13.6 13.6    Chemistries  Recent Labs  Lab 07/31/19 0424 08/01/19 0037 08/02/19 0045 08/03/19 0030 08/04/19 0145 08/05/19 1128 08/06/19 0530  NA 137 139 142 144 143 139 142  K 4.0 3.9 4.6 4.4 4.3 3.1* 3.3*  CL 102 101 101 105 107 105 104  CO2 22 27 27 27 26 23 27   GLUCOSE 139* 194* 150* 139* 167* 99 101*  BUN 15 14 15 16 20 15 14   CREATININE 0.64 0.80 0.73 0.90 0.83 0.69 0.66  CALCIUM 8.4* 8.4* 8.6* 8.5* 8.3* 8.1* 8.3*  MG 1.5* 1.6*  --   --   --   --   --   AST 23 28  --   --  36 35 35  ALT 14 17  --   --  32 31 36  ALKPHOS 53 53  --   --  55 51 56  BILITOT 0.7 0.4  --   --  0.9 1.2 0.9   ------------------------------------------------------------------------------------------------------------------ No results for input(s): CHOL, HDL, LDLCALC, TRIG, CHOLHDL, LDLDIRECT in the last 72 hours.  Lab Results  Component Value Date   HGBA1C 6.0 07/09/2019   ------------------------------------------------------------------------------------------------------------------ No results for input(s): TSH, T4TOTAL, T3FREE, THYROIDAB in  the last 72 hours.  Invalid input(s): FREET3 ------------------------------------------------------------------------------------------------------------------ No results for input(s): VITAMINB12, FOLATE, FERRITIN, TIBC, IRON, RETICCTPCT in the last 72 hours.  Coagulation profile No results for input(s): INR, PROTIME in the last 168 hours.  Recent Labs    08/05/19 1128 08/06/19 0530  DDIMER 6.02* 0.51*    Cardiac Enzymes No results for input(s): CKMB, TROPONINI, MYOGLOBIN in the last 168 hours.  Invalid input(s): CK ------------------------------------------------------------------------------------------------------------------ No results found for: BNP  Micro Results Recent Results (from the past 240 hour(s))  Urine culture     Status: None   Collection Time: 07/28/19 11:00 AM   Specimen: Urine, Clean Catch  Result Value Ref Range Status   Specimen Description   Final    URINE, CLEAN CATCH Performed at [facility], [address] [place], [place] [address]    Special Requests   Final    NONE Performed at [facility], [address] [place], [place] [address]    Culture   Final  NO GROWTH Performed at [facility], [address], [place], [place] [address]    Report Status 07/29/2019 FINAL  Final  SARS CORONAVIRUS 2 (TAT 6-24 HRS) Nasopharyngeal Nasopharyngeal Swab     Status: Abnormal   Collection Time: 07/28/19 12:46 PM   Specimen: Nasopharyngeal Swab  Result Value Ref Range Status   SARS Coronavirus 2 POSITIVE (A) NEGATIVE Final    Comment: RESULT CALLED TO, READ BACK BY AND VERIFIED WITH: V.JACKSON RN 2026 07/28/2019 MCCORMICK K (NOTE) SARS-CoV-2 target nucleic acids are DETECTED. The SARS-CoV-2 RNA is generally detectable in upper and lower respiratory specimens during the acute phase of infection. Positive results are indicative of active infection with SARS-CoV-2. Clinical  correlation with patient  history and other diagnostic information is necessary to determine patient infection status. Positive results do  not rule out bacterial infection or co-infection with other viruses. The expected result is Negative. Fact Sheet for Patients: HairSlick.no Fact Sheet for Healthcare Providers: quierodirigir.com This test is not yet approved or cleared by the [place] FDA and  has been authorized for detection and/or diagnosis of SARS-CoV-2 by FDA under an Emergency Use Authorization (EUA). This EUA will remain  in effect (meaning this test can be used)  for the duration of the COVID-19 declaration under Section 564(b)(1) of the Act, 21 U.S.C. section 360bbb-3(b)(1), unless the authorization is terminated or revoked sooner. Performed at [facility], [address], [place], [place] [address]     Radiology Reports Dg Thoracic Spine 2 View  Result Date: 07/28/2019 CLINICAL DATA:  Fall with midline tenderness, back pain, initial encounter. EXAM: THORACIC SPINE 2 VIEWS COMPARISON:  Two-view chest radiograph 04/25/2015. FINDINGS: Alignment is anatomic. Difficult to exclude very mild compression of the T12 superior endplate. Multilevel endplate degenerative changes. IMPRESSION: 1. Difficult to exclude very mild compression of the T12 superior endplate. 2. Multilevel degenerative disc disease. Electronically Signed   By: Leanna Battles M.D.   On: 07/28/2019 12:52   Dg Lumbar Spine Complete  Result Date: 07/28/2019 CLINICAL DATA:  78 year old female with fall back pain. EXAM: LUMBAR SPINE - COMPLETE 4+ VIEW COMPARISON:  Abdominal radiograph dated 05/11/2018. FINDINGS: There is no acute fracture or subluxation of the lumbar spine. There is osteopenia with multilevel degenerative changes. Grade 2 L4-L5 anterolisthesis. Degenerative changes at L5-S1 with endplate irregularity and sclerosis. Right upper quadrant cholecystectomy clips.  IMPRESSION: 1. No acute fracture or subluxation of the lumbar spine. 2. Multilevel degenerative changes and grade 2 L4 L5 anterolisthesis. Electronically Signed   By: Elgie Collard M.D.   On: 07/28/2019 12:52   Dg Chest Portable 1 View  Result Date: 07/28/2019 CLINICAL DATA:  Recent fall.  Generalized weakness EXAM: PORTABLE CHEST 1 VIEW COMPARISON:  December 15, 2015 FINDINGS: No edema or consolidation. Heart is upper normal in size with pulmonary vascularity normal. No adenopathy. Loop recorder noted on the left. There is degenerative change in each shoulder with superior migration of each humeral head. No pneumothorax. IMPRESSION: No edema or consolidation. Heart upper normal in size. Loop recorder on the left. Suspect chronic rotator cuff tear in each shoulder. Electronically Signed   By: Bretta Bang III M.D.   On: 07/28/2019 11:43

## 2019-08-06 NOTE — Progress Notes (Signed)
Nutrition Follow-up  RD working remotely.  DOCUMENTATION CODES:   Not applicable  INTERVENTION:    Continue MVI with minerals daily   Continue to offer Ensure Enlive po TID when alert, each supplement provides 350 kcal and 20 grams of protein   Continue Hormel Shake daily with Breakfast which provides 520 kcals and 22 g of protein and Magic cup BID with lunch and dinner, each supplement provides 290 kcal and 9 grams of protein, automatically on meal trays to optimize nutritional intake.    NUTRITION DIAGNOSIS:   Increased nutrient needs related to acute illness (COVID-19) as evidenced by estimated needs   Ongoing   GOAL:   Patient will meet greater than or equal to 90% of their needs   Progressing  MONITOR:   PO intake, Supplement acceptance, Labs, Weight trends  ASSESSMENT:   78 year old female who presented to the ED on 11/16 with weakness and poor PO intake. PMH of HTN, T2DM, multiple strokes, dementia, GERD, anxiety, depression. Pt was found to be COVID-19 positive.   Patient is on a regular diet, consuming 0-25% of meals. She continues to refuse most Ensure Enlive supplements. She has a new pressure injury, stage II to coccyx.  Noted no feeding tube planned given mental status and advanced age.  Labs reviewed. K 3.3 (L) CBG's: 119-93  Meds include vitamin D, decadron, novolog, KCl, MVI, vitamin B-12, vitamin C, zinc sulfate.  Diet Order:   Diet Order            Diet regular Room service appropriate? Yes; Fluid consistency: Thin  Diet effective now              EDUCATION NEEDS:   No education needs have been identified at this time  Skin:  Skin Assessment: Skin Integrity Issues: Skin Integrity Issues:: Stage II Stage II: coccyx  Last BM:  11/22  Height:   Ht Readings from Last 1 Encounters:  07/28/19 5\' 2"  (1.575 m)    Weight:   Wt Readings from Last 1 Encounters:  08/05/19 61.2 kg    Ideal Body Weight:  50 kg  BMI:  Body mass index  is 24.68 kg/m.  Estimated Nutritional Needs:   Kcal:  1600-1800  Protein:  80-90 gm  Fluid:  >/= 1.6 L   Molli Barrows, RD, LDN, [place] Pager [phone] After Hours Pager [phone]

## 2019-08-06 NOTE — Progress Notes (Addendum)
Soft bp at 5:44 MD made aware Bp was 84/57. Orders to wait and recheck in one hour. BP is now 71/54. MD paged via secure chat   Night coverage paged

## 2019-08-06 NOTE — Progress Notes (Signed)
Rapid Response called due to BP in the 70s and 80s. Dr Vanita Ingles was previously paged however no new orders were placed. Pt boluse 400 ml NS. BP currently in the 886 systolic. Spoke with Dr Vanita Ingles who ordered manual BP be checked at 120/90. Still no new orders per MD. Charge nurse at bedside. Will continue to monitor pt. All safety precautions implemented.

## 2019-08-07 LAB — CBC
HCT: 36.9 % (ref 36.0–46.0)
Hemoglobin: 12.2 g/dL (ref 12.0–15.0)
MCH: 29.7 pg (ref 26.0–34.0)
MCHC: 33.1 g/dL (ref 30.0–36.0)
MCV: 89.8 fL (ref 80.0–100.0)
Platelets: 582 10*3/uL — ABNORMAL HIGH (ref 150–400)
RBC: 4.11 MIL/uL (ref 3.87–5.11)
RDW: 13.7 % (ref 11.5–15.5)
WBC: 7 10*3/uL (ref 4.0–10.5)
nRBC: 0 % (ref 0.0–0.2)

## 2019-08-07 LAB — COMPREHENSIVE METABOLIC PANEL
ALT: 56 U/L — ABNORMAL HIGH (ref 0–44)
AST: 46 U/L — ABNORMAL HIGH (ref 15–41)
Albumin: 2.7 g/dL — ABNORMAL LOW (ref 3.5–5.0)
Alkaline Phosphatase: 59 U/L (ref 38–126)
Anion gap: 10 (ref 5–15)
BUN: 23 mg/dL (ref 8–23)
CO2: 26 mmol/L (ref 22–32)
Calcium: 8.4 mg/dL — ABNORMAL LOW (ref 8.9–10.3)
Chloride: 105 mmol/L (ref 98–111)
Creatinine, Ser: 0.77 mg/dL (ref 0.44–1.00)
GFR calc Af Amer: >60 mL/min (ref >60)
GFR calc non Af Amer: >60 mL/min (ref >60)
Glucose, Bld: 128 mg/dL — ABNORMAL HIGH (ref 70–99)
Potassium: 3.8 mmol/L (ref 3.5–5.1)
Sodium: 141 mmol/L (ref 135–145)
Total Bilirubin: 0.7 mg/dL (ref 0.3–1.2)
Total Protein: 6.2 g/dL — ABNORMAL LOW (ref 6.5–8.1)

## 2019-08-07 LAB — FERRITIN: Ferritin: 334 ng/mL — ABNORMAL HIGH (ref 11–307)

## 2019-08-07 LAB — GLUCOSE, CAPILLARY
Glucose-Capillary: 107 mg/dL — ABNORMAL HIGH (ref 70–99)
Glucose-Capillary: 97 mg/dL (ref 70–99)
Glucose-Capillary: 147 mg/dL — ABNORMAL HIGH (ref 70–99)
Glucose-Capillary: 126 mg/dL — ABNORMAL HIGH (ref 70–99)

## 2019-08-07 LAB — C-REACTIVE PROTEIN: CRP: 2.9 mg/dL — ABNORMAL HIGH (ref <1.0)

## 2019-08-07 LAB — D-DIMER, QUANTITATIVE: D-Dimer, Quant: 0.66 ug/mL-FEU — ABNORMAL HIGH (ref 0.00–0.50)

## 2019-08-07 MED ORDER — MIDODRINE HCL 2.5 MG PO TABS
2.5000 mg | ORAL_TABLET | Freq: Two times a day (BID) | ORAL | Status: DC
  Administered 2019-08-07 – 2019-08-09 (×5): 2.5 mg via ORAL
  Filled 2019-08-07: qty 0.5
  Filled 2019-08-07: qty 1
  Filled 2019-08-07 (×2): qty 0.5
  Filled 2019-08-07: qty 1
  Filled 2019-08-07: qty 0.5
  Filled 2019-08-07: qty 1

## 2019-08-07 MED ORDER — SODIUM CHLORIDE 0.45 % IV BOLUS
500.0000 mL | Freq: Once | INTRAVENOUS | Status: AC
  Administered 2019-08-07: 500 mL via INTRAVENOUS

## 2019-08-07 NOTE — Plan of Care (Signed)
  Problem: Activity: Goal: Risk for activity intolerance will decrease Outcome: Progressing   Problem: Nutrition: Goal: Adequate nutrition will be maintained Outcome: Progressing   Problem: Coping: Goal: Level of anxiety will decrease Outcome: Progressing   Problem: Elimination: Goal: Will not experience complications related to urinary retention Outcome: Progressing   Problem: Safety: Goal: Ability to remain free from injury will improve Outcome: Progressing   Problem: Skin Integrity: Goal: Risk for impaired skin integrity will decrease Outcome: Progressing   

## 2019-08-07 NOTE — Plan of Care (Signed)

## 2019-08-07 NOTE — Progress Notes (Signed)
At 0415 Bladder scan completed showing 555 ml. Dr Vanita Ingles notified orders placed for in and out Cath. Pt catheterized 250 ml clear yellow urine. Bladder scan rechecked 1 hour later showing 30 ml. Continuing to monitor pt. All safety precautions implemented.

## 2019-08-07 NOTE — Plan of Care (Signed)
Spoke with Pt daughter Sunday Spillers and updated her on pt plan of care. Daughter verbalized understanding

## 2019-08-07 NOTE — Progress Notes (Signed)
PROGRESS NOTE                                                                                                                                                                                                             Patient Demographics:    Tracy [name], is a 78 y.o. female, DOB - March 15, 1941, VHQ:[id number]  Outpatient Primary MD for the patient is Plotnikov, Georgina Quint, MD   Admit date - 07/28/2019   LOS - 8  No chief complaint on file.      Brief Narrative: Patient is a 78 y.o. female with PMHx of advanced dementia, depression, anxiety, DM-2, restless leg syndrome-hypoxia secondary to COVID-19 pneumonia.  She was treated with steroids and remdesivir with improvement in her hypoxemia-however hospital course now complicated by superimposed encephalopathy and delirium and failure to thrive syndrome.  See below for further details   Subjective:    Tracy [name] today remains somewhat confused-but seems to be a little bit more alert compared to yesterday.   Assessment  & Plan :   Acute Hypoxic Resp Failure due to Covid 19 Viral pneumonia: Improved-remains on room air.  Improved-on room air.  Treat remdesivir on 11/26-given that she is on room air-we will stop steroids-should help with encephalopathy.    Fever: afebrile  O2 requirements:  SpO2: 97 % O2 Flow Rate (L/min): 0 L/min   COVID-19 Labs: Recent Labs    08/05/19 1128 08/06/19 0530 08/07/19 0515  DDIMER 6.02* 0.51* 0.66*  FERRITIN  --   --  334*  CRP 4.2* 3.6* 2.9*    Lab Results  Component Value Date   SARSCOV2NAA POSITIVE (A) 07/28/2019     COVID-19 Medications: Steroids: 11/18>>11/26 Remdesivir: 11/22>>11/26 Actemra:not given Convalescent Plasma:not given  Other medications: Diuretics:Euvolemic-no need for lasix Antibiotics:Not needed as no evidence of bacterial infection  Prone/Incentive Spirometry: not cooperative given dementia  DVT  Prophylaxis  :  Lovenox   Acute metabolic encephalopathy superimposed on advanced dementia: Metabolic encephalopathy likely secondary to COVID-19-she is pleasantly confused.  Continue Aricept-if confusion/agitation worsens-we can try low-dose Risperdal at night.  Failure to thrive syndrome/poor appetite: Worsened by acute illness-suspect has significant amount of frailty at baseline.  Continue supportive care-do not think she is a good candidate for enteral/tube feeding given agitation/confusion.  Per nursing staff-her appetite is slowly coming back-she is able to consume  some Ensure, applesauce and around 20-30% of her meals.  Transient hypotension: Resolved with IV fluids.  Could have been secondary to dehydration.  Starting low-dose midodrine.  DM-2: CBG stable with SSI-follow  CBG (last 3)  Recent Labs    08/06/19 2223 08/07/19 0806 08/07/19 1159  GLUCAP 201* 107* 97   GERD: Continue PPI  History of CVA: Continue aspirin  Anxiety/depression: Continue doxepin, Lexapro  Nutrition Problem: Nutrition Problem: Increased nutrient needs Etiology: acute illness(COVID-19) Signs/Symptoms: estimated needs Interventions: Ensure Enlive (each supplement provides 350kcal and 20 grams of protein), MVI  RN pressure injury documentation: Pressure Injury 08/04/19 Coccyx Mid Stage II -  Partial thickness loss of dermis presenting as a shallow open ulcer with a red, pink wound bed without slough. (Active)  08/04/19 2000  Location: Coccyx  Location Orientation: Mid  Staging: Stage II -  Partial thickness loss of dermis presenting as a shallow open ulcer with a red, pink wound bed without slough.  Wound Description (Comments):   Present on Admission:    Consults  :  None  Procedures  :  None  ABG:    Component Value Date/Time   TCO2 29 08/08/2008 1556    Vent Settings: N/A  Condition - Extremely Guarded  Family Communication  : Daughter updated over the phone on 11/26  Code Status  :  Full Code  Diet :  Diet Order            Diet regular Room service appropriate? Yes; Fluid consistency: Thin  Diet effective now              Disposition Plan  :  Remain hospitalized-SNF on discharge  Barriers to discharge: Hypoxia requiring O2 supplementation/complete 5 days of IV Remdesivir  Antimicorbials  :    Anti-infectives (From admission, onward)   Start     Dose/Rate Route Frequency Ordered Stop   08/04/19 1000  remdesivir 100 mg in sodium chloride 0.9 % 250 mL IVPB     100 mg 500 mL/hr over 30 Minutes Intravenous Every 24 hours 08/03/19 0830 08/07/19 1145   08/03/19 1000  remdesivir 200 mg in sodium chloride 0.9 % 250 mL IVPB     200 mg 500 mL/hr over 30 Minutes Intravenous Once 08/03/19 0830 08/03/19 1115      Inpatient Medications  Scheduled Meds: . aspirin EC  81 mg Oral Daily  . cetaphil  1 application Topical BID  . cholecalciferol  1,000 Units Oral Daily  . donepezil  10 mg Oral QHS  . doxepin  25 mg Oral BID  . enoxaparin (LOVENOX) injection  40 mg Subcutaneous Q24H  . escitalopram  10 mg Oral Daily  . famotidine  40 mg Oral Daily  . feeding supplement (ENSURE ENLIVE)  237 mL Oral TID BM  . insulin aspart  0-9 Units Subcutaneous TID WC  . midodrine  2.5 mg Oral BID WC  . multivitamin with minerals  1 tablet Oral Daily  . pantoprazole  40 mg Oral Daily  . cyanocobalamin  1,000 mcg Oral Daily  . vitamin C  500 mg Oral BID  . zinc sulfate  220 mg Oral Daily   Continuous Infusions: . sodium chloride 40 mL/hr at 08/06/19 1650   PRN Meds:.acetaminophen, acetaminophen, bisacodyl, lubiprostone, meclizine, polyethylene glycol   Time Spent in minutes  25  See all Orders from today for further details   Jeoffrey MassedShanker Amya Hlad M.D on 08/07/2019 at 2:53 PM  To page go to [email or web address] - use universal password  [facility] -  Office  [phone]    Objective:   Vitals:   08/07/19 0656 08/07/19 0657 08/07/19 0700 08/07/19 0808  BP:   117/66  122/65  Pulse: 72  77   Resp: 13 16 15    Temp:    98.6 F (37 C)  TempSrc:    Oral  SpO2: 92%  97%   Weight:      Height:        Wt Readings from Last 3 Encounters:  08/07/19 59.6 kg  07/09/19 64 kg  03/24/19 66.2 kg     Intake/Output Summary (Last 24 hours) at 08/07/2019 1453 Last data filed at 08/07/2019 1252 Gross per 24 hour  Intake 620 ml  Output 250 ml  Net 370 ml     Physical Exam Gen Exam: Confused-not in any distress HEENT:atraumatic, normocephalic Chest: B/L clear to auscultation anteriorly CVS:S1S2 regular Abdomen:soft non tender, non distended Extremities:no edema Neurology: Difficult exam but seems to move all 4 extremities Skin: no rash   Data Review:    CBC Recent Labs  Lab 08/03/19 0030 08/04/19 0145 08/05/19 1128 08/06/19 0530 08/07/19 0515  WBC 11.6* 7.2 8.0 6.7 7.0  HGB 13.0 12.9 12.8 12.1 12.2  HCT 39.7 39.3 38.1 36.3 36.9  PLT 361 407* 521* 535* 582*  MCV 91.7 92.0 89.6 89.2 89.8  MCH 30.0 30.2 30.1 29.7 29.7  MCHC 32.7 32.8 33.6 33.3 33.1  RDW 13.6 13.9 13.6 13.6 13.7    Chemistries  Recent Labs  Lab 08/01/19 0037  08/03/19 0030 08/04/19 0145 08/05/19 1128 08/06/19 0530 08/07/19 0515  NA 139   < > 144 143 139 142 141  K 3.9   < > 4.4 4.3 3.1* 3.3* 3.8  CL 101   < > 105 107 105 104 105  CO2 27   < > 27 26 23 27 26   GLUCOSE 194*   < > 139* 167* 99 101* 128*  BUN 14   < > 16 20 15 14 23   CREATININE 0.80   < > 0.90 0.83 0.69 0.66 0.77  CALCIUM 8.4*   < > 8.5* 8.3* 8.1* 8.3* 8.4*  MG 1.6*  --   --   --   --   --   --   AST 28  --   --  36 35 35 46*  ALT 17  --   --  32 31 36 56*  ALKPHOS 53  --   --  55 51 56 59  BILITOT 0.4  --   --  0.9 1.2 0.9 0.7   < > = values in this interval not displayed.   ------------------------------------------------------------------------------------------------------------------ No results for input(s): CHOL, HDL, LDLCALC, TRIG, CHOLHDL, LDLDIRECT in the last 72 hours.  Lab Results   Component Value Date   HGBA1C 6.0 07/09/2019   ------------------------------------------------------------------------------------------------------------------ No results for input(s): TSH, T4TOTAL, T3FREE, THYROIDAB in the last 72 hours.  Invalid input(s): FREET3 ------------------------------------------------------------------------------------------------------------------ Recent Labs    08/07/19 0515  FERRITIN 334*    Coagulation profile No results for input(s): INR, PROTIME in the last 168 hours.  Recent Labs    08/06/19 0530 08/07/19 0515  DDIMER 0.51* 0.66*    Cardiac Enzymes No results for input(s): CKMB, TROPONINI, MYOGLOBIN in the last 168 hours.  Invalid input(s): CK ------------------------------------------------------------------------------------------------------------------ No results found for: BNP  Micro Results No results found for this or any previous visit (from the past 240 hour(s)).  Radiology Reports Dg Thoracic Spine 2 View  Result Date:  07/28/2019 CLINICAL DATA:  Fall with midline tenderness, back pain, initial encounter. EXAM: THORACIC SPINE 2 VIEWS COMPARISON:  Two-view chest radiograph 04/25/2015. FINDINGS: Alignment is anatomic. Difficult to exclude very mild compression of the T12 superior endplate. Multilevel endplate degenerative changes. IMPRESSION: 1. Difficult to exclude very mild compression of the T12 superior endplate. 2. Multilevel degenerative disc disease. Electronically Signed   By: Lorin Picket M.D.   On: 07/28/2019 12:52   Dg Lumbar Spine Complete  Result Date: 07/28/2019 CLINICAL DATA:  78 year old female with fall back pain. EXAM: LUMBAR SPINE - COMPLETE 4+ VIEW COMPARISON:  Abdominal radiograph dated 05/11/2018. FINDINGS: There is no acute fracture or subluxation of the lumbar spine. There is osteopenia with multilevel degenerative changes. Grade 2 L4-L5 anterolisthesis. Degenerative changes at L5-S1 with endplate  irregularity and sclerosis. Right upper quadrant cholecystectomy clips. IMPRESSION: 1. No acute fracture or subluxation of the lumbar spine. 2. Multilevel degenerative changes and grade 2 L4 L5 anterolisthesis. Electronically Signed   By: Anner Crete M.D.   On: 07/28/2019 12:52   Dg Chest Portable 1 View  Result Date: 07/28/2019 CLINICAL DATA:  Recent fall.  Generalized weakness EXAM: PORTABLE CHEST 1 VIEW COMPARISON:  December 15, 2015 FINDINGS: No edema or consolidation. Heart is upper normal in size with pulmonary vascularity normal. No adenopathy. Loop recorder noted on the left. There is degenerative change in each shoulder with superior migration of each humeral head. No pneumothorax. IMPRESSION: No edema or consolidation. Heart upper normal in size. Loop recorder on the left. Suspect chronic rotator cuff tear in each shoulder. Electronically Signed   By: Lowella Grip III M.D.   On: 07/28/2019 11:43

## 2019-08-08 LAB — GLUCOSE, CAPILLARY
Glucose-Capillary: 88 mg/dL (ref 70–99)
Glucose-Capillary: 97 mg/dL (ref 70–99)
Glucose-Capillary: 93 mg/dL (ref 70–99)
Glucose-Capillary: 90 mg/dL (ref 70–99)

## 2019-08-08 LAB — FERRITIN: Ferritin: 273 ng/mL (ref 11–307)

## 2019-08-08 MED ORDER — FLUCONAZOLE 200 MG PO TABS
200.0000 mg | ORAL_TABLET | Freq: Once | ORAL | Status: AC
  Administered 2019-08-08: 200 mg via ORAL
  Filled 2019-08-08: qty 1

## 2019-08-08 NOTE — Progress Notes (Signed)
Occupational Therapy Treatment Patient Details Name: Tracy [name] MRN: [id number] DOB: 17-Jun-1941 Today's Date: 08/08/2019    History of present illness Pt was admitted with weakness and found to have COVID.  PMH:  ? mild compression fx at T12, CVA, DM, HTN and dementia   OT comments  Attempted to focus session on feeding and basic grooming. Pt able to hold cup and bring to mouth. Pt asking for a banana, but would not eat the banana. Attempted hand over hand, however poor PO intake. BUE strengthening/AROM using movement while listening to [employer]. Continue to recommend rehab at [facility].   Follow Up Recommendations  SNF    Equipment Recommendations  None recommended by OT    Recommendations for Other Services      Precautions / Restrictions Precautions Precautions: Fall Restrictions Weight Bearing Restrictions: No       Mobility Bed Mobility Overal bed mobility: Needs Assistance Bed Mobility: Supine to Sit;Sit to Supine Rolling: Mod assist Sidelying to sit: Mod assist;HOB elevated(with rail)   Sit to supine: Min assist   General bed mobility comments: +2 Max A to scoot up in bed to reposition  Transfers Overall transfer level: Needs assistance Equipment used: 1 person hand held assist Transfers: Sit to/from Stand Sit to Stand: Mod assist;Min assist         General transfer comment: not attempted; stood earlier with PT    Balance Overall balance assessment: Needs assistance Sitting-balance support: Single extremity supported;Feet supported Sitting balance-Leahy Scale: Fair Sitting balance - Comments: was able to sit EOB unsupported for some time   Standing balance support: During functional activity;Bilateral upper extremity supported Standing balance-Leahy Scale: Poor Standing balance comment: slight posterior lean initially                           ADL either performed or assessed with clinical judgement   ADL Overall ADL's : Needs  assistance/impaired Eating/Feeding: Moderate assistance Eating/Feeding Details (indicate cue type and reason): Pt able to hold cup and bring to mouth; poor PO intake; attempted to feed banana as pt asked for a banana Grooming: Minimal assistance;Wash/dry face Grooming Details (indicate cue type and reason): Pt washed face once a warm washcloth was handed to her Upper Body Bathing: (VC required)                                   Vision   Vision Assessment?: Vision impaired- to be further tested in functional context   Perception     Praxis      Cognition Arousal/Alertness: Awake/alert Behavior During Therapy: Restless Overall Cognitive Status: Impaired/Different from baseline Area of Impairment: Orientation;Attention;Memory;Following commands;Safety/judgement;Awareness;Problem solving                 Orientation Level: Disoriented to;Place;Time;Situation Current Attention Level: Focused Memory: Decreased short-term memory Following Commands: Follows one step commands inconsistently Safety/Judgement: Decreased awareness of safety;Decreased awareness of deficits Awareness: Intellectual Problem Solving: Slow processing;Decreased initiation;Difficulty sequencing;Requires verbal cues;Requires tactile cues General Comments: followed instructions for coming to EOB, sit to stand (multiple reps with trying different hand-holds) and side-stepping toward [facility]        Exercises     Shoulder Instructions       General Comments Left Gospel Music playing for pt which she appeared to enjoy    Pertinent Vitals/ Pain       Pain Assessment: Faces Faces  Pain Scale: No hurt  Home Living                                          Prior Functioning/Environment              Frequency  Min 2X/week        Progress Toward Goals  OT Goals(current goals can now be found in the care plan section)  Progress towards OT goals: Progressing toward  goals  Acute Rehab OT Goals Patient Stated Goal: none stated OT Goal Formulation: Patient unable to participate in goal setting Time For Goal Achievement: 08/12/19 Potential to Achieve Goals: Fair ADL Goals Pt Will Perform Eating: with min assist;sitting Pt Will Transfer to Toilet: with mod assist;with +2 assist;bedside commode;stand pivot transfer Additional ADL Goal #1: pt will perform bed mobility and go from sit to stand with mod A, +2 safety for adls  Plan Discharge plan remains appropriate    Co-evaluation                 AM-PAC OT "6 Clicks" Daily Activity     Outcome Measure   Help from another person eating meals?: A Lot Help from another person taking care of personal grooming?: A Lot Help from another person toileting, which includes using toliet, bedpan, or urinal?: Total Help from another person bathing (including washing, rinsing, drying)?: Total Help from another person to put on and taking off regular upper body clothing?: Total Help from another person to put on and taking off regular lower body clothing?: Total 6 Click Score: 8    End of Session    OT Visit Diagnosis: Muscle weakness (generalized) (M62.81)   Activity Tolerance Patient tolerated treatment well   Patient Left in bed;in CPM;with bed alarm set   Nurse Communication Mobility status        Time: 1555-1610 OT Time Calculation (min): 15 min  Charges: OT General Charges $OT Visit: 1 Visit OT Treatments $Self Care/Home Management : 8-22 mins  Maurie Boettcher, OT/L   Acute OT Clinical Specialist [place] Pager [phone] Office [phone]    [facility] 08/08/2019, 4:29 PM

## 2019-08-08 NOTE — TOC Initial Note (Signed)
Transition of Care [facility]) - Initial/Assessment Note    Patient Details  Name: Tracy [name] MRN: [id number] Date of Birth: [birth year]  Transition of [facility]) CM/SW Contact:    Malon Kindle, LCSW Phone Number: [phone] 08/08/2019, 4:16 PM  Clinical Narrative:                  Clinical Social Worker spoke with patient daughter over the phone to offer support and discuss patient needs at discharge.  Patient daughter states that patient spouse passed away on 01-22-2023 and it has been an emotional adjustment.  Patient daughter understands that patient will need SNF placement at discharge and is agreeable with [facility].  [place] will have an available bed for patient on Saturday 11/28 pending insurance authorization.  CSW contacted insurance company and initiated the authorization process.  CSW remains available for support and to facilitate patient discharge needs.  Expected Discharge Plan: Skilled Nursing Facility Barriers to Discharge: Insurance Authorization   Patient Goals and CMS Choice Patient states their goals for this hospitalization and ongoing recovery are:: patient daughter is just hopeful for patient to rehab CMS Medicare.gov Compare Post Acute Care list provided to:: Patient Represenative (must comment)(patient daughter) Choice offered to / list presented to : Adult Children  Expected Discharge Plan and Services Expected Discharge Plan: [place] In-house Referral: Clinical Social Work   Post Acute Care Choice: [place] Living arrangements for the past 2 months: [place]                                      Prior Living Arrangements/Services Living arrangements for the past 2 months: [place] Lives with:: Spouse Patient language and need for interpreter reviewed:: Yes Do you feel safe going back to the place where you live?: Yes      Need for Family Participation in Patient  Care: Yes (Comment) Care giver support system in place?: Yes (comment)   Criminal Activity/Legal Involvement Pertinent to Current Situation/Hospitalization: No - Comment as needed  Activities of Daily Living Home Assistive Devices/Equipment: Cane (specify quad or straight), Eyeglasses, Walker (specify type), Shower chair with back(front wheeled walker, single point cane) ADL Screening (condition at time of admission) Patient's cognitive ability adequate to safely complete daily activities?: No Is the patient deaf or have difficulty hearing?: No Does the patient have difficulty seeing, even when wearing glasses/contacts?: No Does the patient have difficulty concentrating, remembering, or making decisions?: Yes Patient able to express need for assistance with ADLs?: Yes Does the patient have difficulty dressing or bathing?: Yes Independently performs ADLs?: No Communication: Independent Dressing (OT): Dependent Is this a change from baseline?: Change from baseline, expected to last >3 days Grooming: Dependent Is this a change from baseline?: Change from baseline, expected to last >3 days Feeding: Dependent Is this a change from baseline?: Change from baseline, expected to last >3 days Bathing: Dependent Is this a change from baseline?: Change from baseline, expected to last >3 days Toileting: Dependent Is this a change from baseline?: Change from baseline, expected to last >3days In/Out Bed: Dependent Is this a change from baseline?: Change from baseline, expected to last >3 days Walks in Home: Dependent Is this a change from baseline?: Change from baseline, expected to last >3 days Does the patient have difficulty walking or climbing stairs?: Yes(secondary to weakness) Weakness of Legs: Both Weakness of Arms/Hands: Both  Permission Sought/Granted Permission sought to share information with : Family Supports, Magazine features editor    Share Information with NAME: Colleena Kurtenbach  Permission granted to share info w AGENCY: [facility]  Permission granted to share info w Relationship: Daughter  Permission granted to share info w Contact Information: [phone]  Emotional Assessment   Attitude/Demeanor/Rapport: Unable to Assess Affect (typically observed): Unable to Assess Orientation: : Oriented to Self Alcohol / Substance Use: Not Applicable Psych Involvement: No (comment)  Admission diagnosis:  Generalized weakness [R53.1] Compression fracture of T12 vertebra, initial encounter [facility]) [S22.080A] Patient Active Problem List   Diagnosis Date Noted  . Pressure injury of skin 08/05/2019  . COVID-19 virus infection 07/30/2019  . Weakness 07/28/2019  . Hematochezia 07/31/2017  . Well adult exam 06/20/2017  . Abdominal pain 02/26/2017  . Insomnia 02/26/2017  . Cerumen impaction 12/04/2016  . Cervical disc disorder with radiculopathy of cervical region 09/22/2016  . Left rotator cuff tear arthropathy 09/22/2016  . Left shoulder pain 09/01/2016  . Left wrist pain 09/01/2016  . GERD (gastroesophageal reflux disease) 04/25/2016  . Onychomycosis 03/06/2016  . Viral illness 12/21/2015  . Blood in stool 11/30/2015  . Tension headache 10/08/2015  . Subdural hematoma ([name]) 02/05/2015  . Palpitations 01/18/2015  . HLD (hyperlipidemia) 01/12/2015  . Stroke ([name]) 01/12/2015  . Anemia, iron deficiency 12/24/2014  . Generalized anxiety disorder 11/27/2014  . Abscess of right buttock 11/04/2014  . Diabetes mellitus type 2 in obese ([name])   . SIRS (systemic inflammatory response syndrome) ([name]) 10/26/2014  . Umbilical hernia 02/03/2014  . Dementia with aggressive behavior ([name]) 02/03/2014  . Loss of weight 10/06/2013  . Constipation 10/06/2013  . HTN (hypertension) 08/10/2013  . History of CVA (cerebrovascular accident) 08/10/2013   PCP:  Tresa Garter, MD Pharmacy:   [facility] [phone] - [place], [place] - [address] AT [facility] [address] [place] [address] Phone: [phone] Fax: [phone]  [facility] - [place], [place] - [address] [place] [address] [place] [address] Phone: [phone] Fax: [phone]     Social Determinants of Health (SDOH) Interventions    Readmission Risk Interventions No flowsheet data found.

## 2019-08-08 NOTE — Progress Notes (Signed)
Physical Therapy Treatment Patient Details Name: Tracy [name] MRN: [id number] DOB: 29-Apr-1941 Today's Date: 08/08/2019    History of Present Illness Pt was admitted with weakness and found to have COVID.  PMH:  ? mild compression fx at T12, CVA, DM, HTN and dementia    PT Comments    Patient much more alert and able to follow single step commands with verbal, visual, and tactile cues. Stood x 10 reps from EOB progressing from mod assist to min assist (PT in front facing pt and pt holding onto PT's arms). Able to side-step toward [facility], however did not have +2 assist to attempt walking away from the bed. Would need close follow with chair for maximizing distance and safety.     Follow Up Recommendations  SNF     Equipment Recommendations  Other (comment)(defer to next venue, pt poor historian)    Recommendations for Other Services       Precautions / Restrictions Precautions Precautions: Fall Restrictions Weight Bearing Restrictions: No    Mobility  Bed Mobility Overal bed mobility: Needs Assistance Bed Mobility: Supine to Sit;Sit to Supine Rolling: Mod assist Sidelying to sit: Mod assist;HOB elevated(with rail)   Sit to supine: Min assist   General bed mobility comments: multimodal cues but pt able to follow single step commands  Transfers Overall transfer level: Needs assistance Equipment used: 1 person hand held assist Transfers: Sit to/from Stand Sit to Stand: Mod assist;Min assist         General transfer comment: pt wanting to stand and stood x 10 reps with varying assist (once close to bedrail, able to stand with min assist); pt with flexed torso  Ambulation/Gait             General Gait Details: side-stepping 2 steps at a time towards [name]; when asked if she wanted to walk "no, I'm not walking!"   Stairs             Wheelchair Mobility    Modified Rankin (Stroke Patients Only)       Balance Overall balance assessment: Needs  assistance Sitting-balance support: Single extremity supported;Feet supported Sitting balance-Leahy Scale: Fair Sitting balance - Comments: was able to sit EOB unsupported for some time   Standing balance support: During functional activity;Bilateral upper extremity supported Standing balance-Leahy Scale: Poor Standing balance comment: slight posterior lean initially                            Cognition Arousal/Alertness: Awake/alert Behavior During Therapy: Restless Overall Cognitive Status: No family/caregiver present to determine baseline cognitive functioning                                 General Comments: followed instructions for coming to EOB, sit to stand (multiple reps with trying different hand-holds) and side-stepping toward [facility]      Exercises      General Comments General comments (skin integrity, edema, etc.): attempted chair position in bed (after pt assisted with scooting to [facility]), however pt likes to scoot hips down toward foot of bed      Pertinent Vitals/Pain Pain Assessment: Faces Faces Pain Scale: No hurt    Home Living                      Prior Function            PT  Goals (current goals can now be found in the care plan section) Acute Rehab PT Goals Patient Stated Goal: asking for [facility] Time For Goal Achievement: 08/19/19 Potential to Achieve Goals: Fair Progress towards PT goals: Progressing toward goals    Frequency    Min 2X/week      PT Plan Current plan remains appropriate    Co-evaluation              AM-PAC PT "6 Clicks" Mobility   Outcome Measure  Help needed turning from your back to your side while in a flat bed without using bedrails?: A Lot Help needed moving from lying on your back to sitting on the side of a flat bed without using bedrails?: A Lot Help needed moving to and from a bed to a chair (including a wheelchair)?: A Lot Help needed standing up from a chair using your arms  (e.g., wheelchair or bedside chair)?: A Little Help needed to walk in hospital room?: Total Help needed climbing 3-5 steps with a railing? : Total 6 Click Score: 11    End of Session   Activity Tolerance: Patient tolerated treatment well(sats 96-100% on room air) Patient left: in bed;with call bell/phone within reach;with bed alarm set;with nursing/sitter in room Nurse Communication: Mobility status PT Visit Diagnosis: Difficulty in walking, not elsewhere classified (R26.2);Muscle weakness (generalized) (M62.81)     Time: 5462-7035 PT Time Calculation (min) (ACUTE ONLY): 29 min  Charges:  $Gait Training: 23-37 mins                      Barry Brunner, PT Pager [phone]    Rexanne Mano 08/08/2019, 3:01 PM

## 2019-08-08 NOTE — Progress Notes (Signed)
PROGRESS NOTE                                                                                                                                                                                                             Patient Demographics:    Tracy [name], is a 78 y.o. female, DOB - [birth year], ZOX:[id number]  Outpatient Primary MD for the patient is Plotnikov, Georgina Quint, MD   Admit date - 07/28/2019   LOS - 9  No chief complaint on file.      Brief Narrative: Patient is a 78 y.o. female with PMHx of advanced dementia, depression, anxiety, DM-2, restless leg syndrome-hypoxia secondary to COVID-19 pneumonia.  She was treated with steroids and remdesivir with improvement in her hypoxemia-however hospital course now complicated by superimposed encephalopathy and delirium and failure to thrive syndrome.  See below for further details   Subjective:    Tracy [name] today remains essentially the same-no major events overnight.  She still is pleasantly confused.   Assessment  & Plan :   Acute Hypoxic Resp Failure due to Covid 19 Viral pneumonia: Improved-remains on room air.  Improved-treated with remdesivir and steroids-she is now on room air.   Fever: afebrile  O2 requirements:  SpO2: 92 % O2 Flow Rate (L/min): 0 L/min   COVID-19 Labs: Recent Labs    08/06/19 0530 08/07/19 0515 08/08/19 0255  DDIMER 0.51* 0.66*  --   FERRITIN  --  334* 273  CRP 3.6* 2.9*  --     Lab Results  Component Value Date   SARSCOV2NAA POSITIVE (A) 07/28/2019     COVID-19 Medications: Steroids: 11/18>>11/26 Remdesivir: 11/22>>11/26 Actemra:not given Convalescent Plasma:not given  Other medications: Diuretics:Euvolemic-no need for lasix Antibiotics:Not needed as no evidence of bacterial infection  Prone/Incentive Spirometry: not cooperative given dementia  DVT Prophylaxis  :  Lovenox   Acute metabolic encephalopathy  superimposed on advanced dementia: Metabolic encephalopathy likely secondary to COVID-19-she is pleasantly confused.  Continue Aricept-if confusion/agitation worsens-we can try low-dose Risperdal at night.  Failure to thrive syndrome/poor appetite: Worsened by acute illness-suspect has significant amount of frailty at baseline.  Continue supportive care-do not think she is a good candidate for enteral/tube feeding given agitation/confusion.  Per nursing staff-her appetite is slowly coming back-she is able to consume some Ensure, applesauce and around 20-30% of her meals.  Transient hypotension: Resolved with IV fluids.  Could have been secondary to dehydration.  Starting low-dose midodrine.  DM-2: CBG stable with SSI-follow  CBG (last 3)  Recent Labs    08/07/19 2046 08/08/19 0830 08/08/19 1122  GLUCAP 126* 88 97   GERD: Continue PPI  History of CVA: Continue aspirin  Anxiety/depression: Continue doxepin, Lexapro  Nutrition Problem: Nutrition Problem: Increased nutrient needs Etiology: acute illness(COVID-19) Signs/Symptoms: estimated needs Interventions: Ensure Enlive (each supplement provides 350kcal and 20 grams of protein), MVI  RN pressure injury documentation: Pressure Injury 08/04/19 Coccyx Mid Stage II -  Partial thickness loss of dermis presenting as a shallow open ulcer with a red, pink wound bed without slough. (Active)  08/04/19 2000  Location: Coccyx  Location Orientation: Mid  Staging: Stage II -  Partial thickness loss of dermis presenting as a shallow open ulcer with a red, pink wound bed without slough.  Wound Description (Comments):   Present on Admission:    Consults  :  None  Procedures  :  None  ABG:    Component Value Date/Time   TCO2 29 08/08/2008 1556    Vent Settings: N/A  Condition - Extremely Guarded  Family Communication  : Daughter updated over the phone on 11/27  Code Status :  Full Code  Diet :  Diet Order            Diet  regular Room service appropriate? Yes; Fluid consistency: Thin  Diet effective now              Disposition Plan  :  Remain hospitalized-SNF on discharge  Barriers to discharge: Hypoxia requiring O2 supplementation/complete 5 days of IV Remdesivir  Antimicorbials  :    Anti-infectives (From admission, onward)   Start     Dose/Rate Route Frequency Ordered Stop   08/04/19 1000  remdesivir 100 mg in sodium chloride 0.9 % 250 mL IVPB     100 mg 500 mL/hr over 30 Minutes Intravenous Every 24 hours 08/03/19 0830 08/07/19 1145   08/03/19 1000  remdesivir 200 mg in sodium chloride 0.9 % 250 mL IVPB     200 mg 500 mL/hr over 30 Minutes Intravenous Once 08/03/19 0830 08/03/19 1115      Inpatient Medications  Scheduled Meds: . aspirin EC  81 mg Oral Daily  . cetaphil  1 application Topical BID  . cholecalciferol  1,000 Units Oral Daily  . donepezil  10 mg Oral QHS  . doxepin  25 mg Oral BID  . escitalopram  10 mg Oral Daily  . famotidine  40 mg Oral Daily  . feeding supplement (ENSURE ENLIVE)  237 mL Oral TID BM  . insulin aspart  0-9 Units Subcutaneous TID WC  . midodrine  2.5 mg Oral BID WC  . multivitamin with minerals  1 tablet Oral Daily  . pantoprazole  40 mg Oral Daily  . cyanocobalamin  1,000 mcg Oral Daily  . vitamin C  500 mg Oral BID  . zinc sulfate  220 mg Oral Daily   Continuous Infusions: . sodium chloride 40 mL/hr at 08/06/19 1650   PRN Meds:.acetaminophen, acetaminophen, bisacodyl, lubiprostone, meclizine, polyethylene glycol   Time Spent in minutes  25  See all Orders from today for further details   Oren Binet M.D on 08/08/2019 at 2:01 PM  To page go to [email or web address] - use universal password  [facility] -  Office  [phone]    Objective:   Vitals:   08/07/19 2020 08/08/19  0454 08/08/19 0500 08/08/19 0800  BP:   103/63 121/75  Pulse: 64 71 69 67  Resp: 16 16 18 14   Temp:  99.3 F (37.4 C)  97.8 F (36.6 C)  TempSrc:  Oral   Oral  SpO2:  95% 95% 92%  Weight:   60 kg   Height:        Wt Readings from Last 3 Encounters:  08/08/19 60 kg  07/09/19 64 kg  03/24/19 66.2 kg     Intake/Output Summary (Last 24 hours) at 08/08/2019 1401 Last data filed at 08/08/2019 [id number] Gross per 24 hour  Intake -  Output 52 ml  Net -52 ml     Physical Exam Gen Exam: Pleasantly confused HEENT:atraumatic, normocephalic Chest: B/L clear to auscultation anteriorly CVS:S1S2 regular Abdomen:soft non tender, non distended Extremities: Difficult exam-seems to move all 4 extremities Neurology: Non focal Skin: no rash   Data Review:    CBC Recent Labs  Lab 08/03/19 0030 08/04/19 0145 08/05/19 1128 08/06/19 0530 08/07/19 0515  WBC 11.6* 7.2 8.0 6.7 7.0  HGB 13.0 12.9 12.8 12.1 12.2  HCT 39.7 39.3 38.1 36.3 36.9  PLT 361 407* 521* 535* 582*  MCV 91.7 92.0 89.6 89.2 89.8  MCH 30.0 30.2 30.1 29.7 29.7  MCHC 32.7 32.8 33.6 33.3 33.1  RDW 13.6 13.9 13.6 13.6 13.7    Chemistries  Recent Labs  Lab 08/03/19 0030 08/04/19 0145 08/05/19 1128 08/06/19 0530 08/07/19 0515  NA 144 143 139 142 141  K 4.4 4.3 3.1* 3.3* 3.8  CL 105 107 105 104 105  CO2 27 26 23 27 26   GLUCOSE 139* 167* 99 101* 128*  BUN 16 20 15 14 23   CREATININE 0.90 0.83 0.69 0.66 0.77  CALCIUM 8.5* 8.3* 8.1* 8.3* 8.4*  AST  --  36 35 35 46*  ALT  --  32 31 36 56*  ALKPHOS  --  55 51 56 59  BILITOT  --  0.9 1.2 0.9 0.7   ------------------------------------------------------------------------------------------------------------------ No results for input(s): CHOL, HDL, LDLCALC, TRIG, CHOLHDL, LDLDIRECT in the last 72 hours.  Lab Results  Component Value Date   HGBA1C 6.0 07/09/2019   ------------------------------------------------------------------------------------------------------------------ No results for input(s): TSH, T4TOTAL, T3FREE, THYROIDAB in the last 72 hours.  Invalid input(s): FREET3  ------------------------------------------------------------------------------------------------------------------ Recent Labs    08/07/19 0515 08/08/19 0255  FERRITIN 334* 273    Coagulation profile No results for input(s): INR, PROTIME in the last 168 hours.  Recent Labs    08/06/19 0530 08/07/19 0515  DDIMER 0.51* 0.66*    Cardiac Enzymes No results for input(s): CKMB, TROPONINI, MYOGLOBIN in the last 168 hours.  Invalid input(s): CK ------------------------------------------------------------------------------------------------------------------ No results found for: BNP  Micro Results No results found for this or any previous visit (from the past 240 hour(s)).  Radiology Reports Dg Thoracic Spine 2 View  Result Date: 07/28/2019 CLINICAL DATA:  Fall with midline tenderness, back pain, initial encounter. EXAM: THORACIC SPINE 2 VIEWS COMPARISON:  Two-view chest radiograph 04/25/2015. FINDINGS: Alignment is anatomic. Difficult to exclude very mild compression of the T12 superior endplate. Multilevel endplate degenerative changes. IMPRESSION: 1. Difficult to exclude very mild compression of the T12 superior endplate. 2. Multilevel degenerative disc disease. Electronically Signed   By: Leanna BattlesMelinda  Blietz M.D.   On: 07/28/2019 12:52   Dg Lumbar Spine Complete  Result Date: 07/28/2019 CLINICAL DATA:  78 year old female with fall back pain. EXAM: LUMBAR SPINE - COMPLETE 4+ VIEW COMPARISON:  Abdominal radiograph dated 05/11/2018. FINDINGS:  There is no acute fracture or subluxation of the lumbar spine. There is osteopenia with multilevel degenerative changes. Grade 2 L4-L5 anterolisthesis. Degenerative changes at L5-S1 with endplate irregularity and sclerosis. Right upper quadrant cholecystectomy clips. IMPRESSION: 1. No acute fracture or subluxation of the lumbar spine. 2. Multilevel degenerative changes and grade 2 L4 L5 anterolisthesis. Electronically Signed   By: Elgie Collard  M.D.   On: 07/28/2019 12:52   Dg Chest Portable 1 View  Result Date: 07/28/2019 CLINICAL DATA:  Recent fall.  Generalized weakness EXAM: PORTABLE CHEST 1 VIEW COMPARISON:  December 15, 2015 FINDINGS: No edema or consolidation. Heart is upper normal in size with pulmonary vascularity normal. No adenopathy. Loop recorder noted on the left. There is degenerative change in each shoulder with superior migration of each humeral head. No pneumothorax. IMPRESSION: No edema or consolidation. Heart upper normal in size. Loop recorder on the left. Suspect chronic rotator cuff tear in each shoulder. Electronically Signed   By: Bretta Bang III M.D.   On: 07/28/2019 11:43

## 2019-08-09 DIAGNOSIS — R296 Repeated falls: Secondary | ICD-10-CM | POA: Diagnosis not present

## 2019-08-09 DIAGNOSIS — R1312 Dysphagia, oropharyngeal phase: Secondary | ICD-10-CM | POA: Diagnosis not present

## 2019-08-09 DIAGNOSIS — Y939 Activity, unspecified: Secondary | ICD-10-CM | POA: Diagnosis not present

## 2019-08-09 DIAGNOSIS — S0990XA Unspecified injury of head, initial encounter: Secondary | ICD-10-CM | POA: Diagnosis not present

## 2019-08-09 DIAGNOSIS — R278 Other lack of coordination: Secondary | ICD-10-CM | POA: Diagnosis not present

## 2019-08-09 DIAGNOSIS — U071 COVID-19: Secondary | ICD-10-CM | POA: Diagnosis not present

## 2019-08-09 DIAGNOSIS — R404 Transient alteration of awareness: Secondary | ICD-10-CM | POA: Diagnosis not present

## 2019-08-09 DIAGNOSIS — S22080A Wedge compression fracture of T11-T12 vertebra, initial encounter for closed fracture: Secondary | ICD-10-CM | POA: Diagnosis not present

## 2019-08-09 DIAGNOSIS — F0280 Dementia in other diseases classified elsewhere without behavioral disturbance: Secondary | ICD-10-CM | POA: Diagnosis not present

## 2019-08-09 DIAGNOSIS — R410 Disorientation, unspecified: Secondary | ICD-10-CM | POA: Diagnosis not present

## 2019-08-09 DIAGNOSIS — Z79899 Other long term (current) drug therapy: Secondary | ICD-10-CM | POA: Diagnosis not present

## 2019-08-09 DIAGNOSIS — F0391 Unspecified dementia with behavioral disturbance: Secondary | ICD-10-CM | POA: Diagnosis not present

## 2019-08-09 DIAGNOSIS — R442 Other hallucinations: Secondary | ICD-10-CM | POA: Diagnosis not present

## 2019-08-09 DIAGNOSIS — W19XXXA Unspecified fall, initial encounter: Secondary | ICD-10-CM | POA: Diagnosis not present

## 2019-08-09 DIAGNOSIS — G9341 Metabolic encephalopathy: Secondary | ICD-10-CM | POA: Diagnosis not present

## 2019-08-09 DIAGNOSIS — S0083XA Contusion of other part of head, initial encounter: Secondary | ICD-10-CM | POA: Diagnosis not present

## 2019-08-09 DIAGNOSIS — J1289 Other viral pneumonia: Secondary | ICD-10-CM | POA: Diagnosis not present

## 2019-08-09 DIAGNOSIS — W1830XA Fall on same level, unspecified, initial encounter: Secondary | ICD-10-CM | POA: Diagnosis not present

## 2019-08-09 DIAGNOSIS — E46 Unspecified protein-calorie malnutrition: Secondary | ICD-10-CM | POA: Diagnosis not present

## 2019-08-09 DIAGNOSIS — K219 Gastro-esophageal reflux disease without esophagitis: Secondary | ICD-10-CM | POA: Diagnosis not present

## 2019-08-09 DIAGNOSIS — I10 Essential (primary) hypertension: Secondary | ICD-10-CM | POA: Diagnosis not present

## 2019-08-09 DIAGNOSIS — L89153 Pressure ulcer of sacral region, stage 3: Secondary | ICD-10-CM | POA: Diagnosis not present

## 2019-08-09 DIAGNOSIS — S199XXA Unspecified injury of neck, initial encounter: Secondary | ICD-10-CM | POA: Diagnosis not present

## 2019-08-09 DIAGNOSIS — J9601 Acute respiratory failure with hypoxia: Secondary | ICD-10-CM | POA: Diagnosis not present

## 2019-08-09 DIAGNOSIS — E669 Obesity, unspecified: Secondary | ICD-10-CM | POA: Diagnosis not present

## 2019-08-09 DIAGNOSIS — R41841 Cognitive communication deficit: Secondary | ICD-10-CM | POA: Diagnosis not present

## 2019-08-09 DIAGNOSIS — Y92129 Unspecified place in nursing home as the place of occurrence of the external cause: Secondary | ICD-10-CM | POA: Diagnosis not present

## 2019-08-09 DIAGNOSIS — S0093XA Contusion of unspecified part of head, initial encounter: Secondary | ICD-10-CM | POA: Diagnosis present

## 2019-08-09 DIAGNOSIS — E119 Type 2 diabetes mellitus without complications: Secondary | ICD-10-CM | POA: Diagnosis not present

## 2019-08-09 DIAGNOSIS — I6789 Other cerebrovascular disease: Secondary | ICD-10-CM | POA: Diagnosis not present

## 2019-08-09 DIAGNOSIS — R2689 Other abnormalities of gait and mobility: Secondary | ICD-10-CM | POA: Diagnosis not present

## 2019-08-09 DIAGNOSIS — R531 Weakness: Secondary | ICD-10-CM | POA: Diagnosis not present

## 2019-08-09 DIAGNOSIS — Y999 Unspecified external cause status: Secondary | ICD-10-CM | POA: Diagnosis not present

## 2019-08-09 DIAGNOSIS — E118 Type 2 diabetes mellitus with unspecified complications: Secondary | ICD-10-CM | POA: Diagnosis not present

## 2019-08-09 DIAGNOSIS — L89152 Pressure ulcer of sacral region, stage 2: Secondary | ICD-10-CM | POA: Diagnosis not present

## 2019-08-09 DIAGNOSIS — M6281 Muscle weakness (generalized): Secondary | ICD-10-CM | POA: Diagnosis not present

## 2019-08-09 DIAGNOSIS — Z8673 Personal history of transient ischemic attack (TIA), and cerebral infarction without residual deficits: Secondary | ICD-10-CM | POA: Diagnosis not present

## 2019-08-09 DIAGNOSIS — Z7401 Bed confinement status: Secondary | ICD-10-CM | POA: Diagnosis not present

## 2019-08-09 DIAGNOSIS — M2550 Pain in unspecified joint: Secondary | ICD-10-CM | POA: Diagnosis not present

## 2019-08-09 DIAGNOSIS — E1169 Type 2 diabetes mellitus with other specified complication: Secondary | ICD-10-CM | POA: Diagnosis not present

## 2019-08-09 LAB — GLUCOSE, CAPILLARY
Glucose-Capillary: 71 mg/dL (ref 70–99)
Glucose-Capillary: 82 mg/dL (ref 70–99)
Glucose-Capillary: 103 mg/dL — ABNORMAL HIGH (ref 70–99)

## 2019-08-09 MED ORDER — VITAMIN D 1000 UNITS PO TABS: 1000.0000 [IU] | ORAL_TABLET | Freq: Every day | ORAL | Status: DC

## 2019-08-09 MED ORDER — LUBIPROSTONE 24 MCG PO CAPS: 24.0000 ug | ORAL_CAPSULE | Freq: Two times a day (BID) | ORAL | Status: DC | PRN

## 2019-08-09 MED ORDER — DOXEPIN HCL 25 MG PO CAPS: 25.0000 mg | ORAL_CAPSULE | Freq: Two times a day (BID) | ORAL | 3 refills | Status: DC

## 2019-08-09 MED ORDER — ENSURE ENLIVE PO LIQD: 237.0000 mL | Freq: Three times a day (TID) | ORAL | 12 refills | Status: AC

## 2019-08-09 MED ORDER — ASPIRIN EC 81 MG PO TBEC: 81.0000 mg | DELAYED_RELEASE_TABLET | Freq: Every day | ORAL | Status: AC

## 2019-08-09 MED ORDER — CYANOCOBALAMIN 1000 MCG PO TABS: 1000.0000 ug | ORAL_TABLET | Freq: Every day | ORAL | 3 refills | Status: DC

## 2019-08-09 MED ORDER — MECLIZINE HCL 12.5 MG PO TABS: 12.5000 mg | ORAL_TABLET | ORAL | 0 refills | Status: DC | PRN

## 2019-08-09 MED ORDER — ESCITALOPRAM OXALATE 10 MG PO TABS: 10.0000 mg | ORAL_TABLET | Freq: Every day | ORAL | 3 refills | Status: AC

## 2019-08-09 MED ORDER — DONEPEZIL HCL 10 MG PO TABS: ORAL_TABLET | ORAL | 3 refills | Status: DC

## 2019-08-09 NOTE — TOC Transition Note (Signed)
Transition of Care [facility]) - CM/SW Discharge Note   Patient Details  Name: DEVINA BEZOLD MRN: [id number] Date of Birth: 1941/04/13  Transition of Care [facility] CM/SW Contact:  Masato Pettie, Francetta Found, LCSW Phone Number: 08/09/2019, 10:39 AM   Clinical Narrative:     Clinical Social Worker facilitated patient discharge including contacting patient family and facility to confirm patient discharge plans.  Clinical information faxed to facility and family agreeable with plan.  CSW arranged ambulance transport via [facility] to [facility].  RN to call report prior to discharge.  Number for Report [phone] Room: [place] Number [phone]  Clinical Social Worker will sign off for now as social work intervention is no longer needed. Please consult [place] again if new need arises.   Final next level of care: Skilled Nursing Facility Barriers to Discharge: Barriers Resolved   Patient Goals and CMS Choice Patient states their goals for this hospitalization and ongoing recovery are:: patient daughter is just hopeful for patient to rehab CMS Medicare.gov Compare Post Acute Care list provided to:: Patient Represenative (must comment)(patient daughter) Choice offered to / list presented to : Adult Children  Discharge Placement   Existing PASRR number confirmed : 08/09/19          Patient chooses bed at: [facility] Patient to be transferred to facility by: Ambulance Name of family member notified: Sunday Spillers - patient daughter over the phone Patient and family notified of of transfer: 08/09/19  Discharge Plan and Services In-house Referral: Clinical Social Work   Post Acute Care Choice: [place]                               Social Determinants of Health (SDOH) Interventions     Readmission Risk Interventions No flowsheet data found.

## 2019-08-09 NOTE — Progress Notes (Signed)
Called Tracy [name] reported to [employer]

## 2019-08-09 NOTE — Discharge Instructions (Signed)
Person Under Monitoring Name: Tracy [name]  Location: [address] [place] [address]   Infection Prevention Recommendations for Individuals Confirmed to have, or Being Evaluated for, 2019 Novel Coronavirus (COVID-19) Infection Who Receive Care at Home  Individuals who are confirmed to have, or are being evaluated for, COVID-19 should follow the prevention steps below until a healthcare provider or local or state health department says they can return to normal activities.  Stay home except to get medical care You should restrict activities outside your home, except for getting medical care. Do not go to work, school, or public areas, and do not use public transportation or taxis.  Call ahead before visiting your doctor Before your medical appointment, call the healthcare provider and tell them that you have, or are being evaluated for, COVID-19 infection. This will help the healthcare providers office take steps to keep other people from getting infected. Ask your healthcare provider to call the local or state health department.  Monitor your symptoms Seek prompt medical attention if your illness is worsening (e.g., difficulty breathing). Before going to your medical appointment, call the healthcare provider and tell them that you have, or are being evaluated for, COVID-19 infection. Ask your healthcare provider to call the local or state health department.  Wear a facemask You should wear a facemask that covers your nose and mouth when you are in the same room with other people and when you visit a healthcare provider. People who live with or visit you should also wear a facemask while they are in the same room with you.  Separate yourself from other people in your home As much as possible, you should stay in a different room from other people in your home. Also, you should use a separate bathroom, if available.  Avoid sharing household items You should  not share dishes, drinking glasses, cups, eating utensils, towels, bedding, or other items with other people in your home. After using these items, you should wash them thoroughly with soap and water.  Cover your coughs and sneezes Cover your mouth and nose with a tissue when you cough or sneeze, or you can cough or sneeze into your sleeve. Throw used tissues in a lined trash can, and immediately wash your hands with soap and water for at least 20 seconds or use an alcohol-based hand rub.  Wash your [facility] your hands often and thoroughly with soap and water for at least 20 seconds. You can use an alcohol-based hand sanitizer if soap and water are not available and if your hands are not visibly dirty. Avoid touching your eyes, nose, and mouth with unwashed hands.   Prevention Steps for Caregivers and Household Members of Individuals Confirmed to have, or Being Evaluated for, COVID-19 Infection Being Cared for in the Home  If you live with, or provide care at home for, a person confirmed to have, or being evaluated for, COVID-19 infection please follow these guidelines to prevent infection:  Follow healthcare providers instructions Make sure that you understand and can help the patient follow any healthcare provider instructions for all care.  Provide for the patients basic needs You should help the patient with basic needs in the home and provide support for getting groceries, prescriptions, and other personal needs.  Monitor the patients symptoms If they are getting sicker, call his or her medical provider and tell them that the patient has, or is being evaluated for, COVID-19 infection. This will help the healthcare  providers office take steps to keep other people from getting infected. Ask the healthcare provider to call the local or state health department.  Limit the number of people who have contact with the patient  If possible, have only one caregiver for the  patient.  Other household members should stay in another home or place of residence. If this is not possible, they should stay  in another room, or be separated from the patient as much as possible. Use a separate bathroom, if available.  Restrict visitors who do not have an essential need to be in the home.  Keep older adults, very young children, and other sick people away from the patient Keep older adults, very young children, and those who have compromised immune systems or chronic health conditions away from the patient. This includes people with chronic heart, lung, or kidney conditions, diabetes, and cancer.  Ensure good ventilation Make sure that shared spaces in the home have good air flow, such as from an air conditioner or an opened window, weather permitting.  Wash your hands often  Wash your hands often and thoroughly with soap and water for at least 20 seconds. You can use an alcohol based hand sanitizer if soap and water are not available and if your hands are not visibly dirty.  Avoid touching your eyes, nose, and mouth with unwashed hands.  Use disposable paper towels to dry your hands. If not available, use dedicated cloth towels and replace them when they become wet.  Wear a facemask and gloves  Wear a disposable facemask at all times in the room and gloves when you touch or have contact with the patients blood, body fluids, and/or secretions or excretions, such as sweat, saliva, sputum, nasal mucus, vomit, urine, or feces.  Ensure the mask fits over your nose and mouth tightly, and do not touch it during use.  Throw out disposable facemasks and gloves after using them. Do not reuse.  Wash your hands immediately after removing your facemask and gloves.  If your personal clothing becomes contaminated, carefully remove clothing and launder. Wash your hands after handling contaminated clothing.  Place all used disposable facemasks, gloves, and other waste in a lined  container before disposing them with other household waste.  Remove gloves and wash your hands immediately after handling these items.  Do not share dishes, glasses, or other household items with the patient  Avoid sharing household items. You should not share dishes, drinking glasses, cups, eating utensils, towels, bedding, or other items with a patient who is confirmed to have, or being evaluated for, COVID-19 infection.  After the person uses these items, you should wash them thoroughly with soap and water.  Wash laundry thoroughly  Immediately remove and wash clothes or bedding that have blood, body fluids, and/or secretions or excretions, such as sweat, saliva, sputum, nasal mucus, vomit, urine, or feces, on them.  Wear gloves when handling laundry from the patient.  Read and follow directions on labels of laundry or clothing items and detergent. In general, wash and dry with the warmest temperatures recommended on the label.  Clean all areas the individual has used often  Clean all touchable surfaces, such as counters, tabletops, doorknobs, bathroom fixtures, toilets, phones, keyboards, tablets, and bedside tables, every day. Also, clean any surfaces that may have blood, body fluids, and/or secretions or excretions on them.  Wear gloves when cleaning surfaces the patient has come in contact with.  Use a diluted bleach solution (e.g., dilute bleach with  1 part bleach and 10 parts water) or a household disinfectant with a label that says EPA-registered for coronaviruses. To make a bleach solution at home, add 1 tablespoon of bleach to 1 quart (4 cups) of water. For a larger supply, add  cup of bleach to 1 gallon (16 cups) of water.  Read labels of cleaning products and follow recommendations provided on product labels. Labels contain instructions for safe and effective use of the cleaning product including precautions you should take when applying the product, such as wearing gloves or  eye protection and making sure you have good ventilation during use of the product.  Remove gloves and wash hands immediately after cleaning.  Monitor yourself for signs and symptoms of illness Caregivers and household members are considered close contacts, should monitor their health, and will be asked to limit movement outside of the home to the extent possible. Follow the monitoring steps for close contacts listed on the symptom monitoring form.   ? If you have additional questions, contact your local health department or call the epidemiologist on call at [phone] (available 24/7). ? This guidance is subject to change. For the most up-to-date guidance from [facility], please refer to their website: [email or web address]

## 2019-08-09 NOTE — Discharge Summary (Signed)
PATIENT DETAILS Name: Tracy [name] Age: 78 y.o. Sex: female Date of Birth: 08-16-1941 MRN: [id number]. Admitting Physician: Truddie Hidden, MD TIW:[email or web address], Evie Lacks, MD  Admit Date: 07/28/2019 Discharge date: 08/09/2019  Recommendations for Outpatient Follow-up:  1. Follow up with PCP in 1-2 weeks 2. Please obtain CMP/CBC in one week 3. Repeat Chest Xray in 4-6 week   Admitted From:  Home  Disposition: SNF   Home Health: No  Equipment/Devices: None  Discharge Condition: Stable  CODE STATUS: FULL CODE  Diet recommendation:  Diet Order            Diet - low sodium heart healthy        Diet Carb Modified        Diet regular Room service appropriate? Yes; Fluid consistency: Thin  Diet effective now               Brief Summary: See H&P, Labs, Consult and Test reports for all details in brief, Patient is a 78 y.o. female with PMHx of advanced dementia, depression, anxiety, DM-2, restless leg syndrome-hypoxia secondary to COVID-19 pneumonia.  She was treated with steroids and remdesivir with improvement in her hypoxemia-however hospital course now complicated by superimposed encephalopathy and delirium and failure to thrive syndrome.  See below for further details  Brief Hospital Course: Acute Hypoxic Resp Failure due to Covid 19 Viral pneumonia: Treated with remdesivir and steroids, she has been on room air for several days.  Has finished a course of both remdesivir and steroids.    COVID-19 Labs:  Recent Labs    08/07/19 0515 08/08/19 0255  DDIMER 0.66*  --   FERRITIN 334* 273  CRP 2.9*  --     Lab Results  Component Value Date   SARSCOV2NAA POSITIVE (A) 07/28/2019     COVID-19 Medications: Steroids: 11/18>>11/26 Remdesivir: 11/22>>11/26 Actemra:not given Convalescent Plasma:not given   Acute metabolic encephalopathy superimposed on advanced dementia: Metabolic encephalopathy likely secondary to COVID-19-she is pleasantly confused  over the past few days.  Continue Aricept on discharge.  Failure to thrive syndrome/poor appetite: Worsened by acute illness-suspect has significant amount of frailty at baseline.  Continue supportive care-do not think she is a good candidate for enteral/tube feeding given agitation/confusion.  Per nursing staff-her appetite is slowly coming back-she is able to consume some Ensure, applesauce and around 20-30% of her meals.  Transient hypotension: Resolved with IV fluids.  Could have been secondary to dehydration.    DM-2 (A1c 6.0 on 07/09/2019): CBG stable with SSI-on discharge-recommend observation and follow-up at [facility].  GERD: Continue PPI  History of CVA: Continue aspirin  Anxiety/depression: Continue doxepin, Lexapro  Nutrition Problem: Nutrition Problem: Increased nutrient needs Etiology: acute illness(COVID-19) Signs/Symptoms: estimated needs Interventions: Ensure Enlive (each supplement provides 350kcal and 20 grams of protein), MVI  RN pressure injury documentation: Pressure Injury 08/04/19 Coccyx Mid Stage II -  Partial thickness loss of dermis presenting as a shallow open ulcer with a red, pink wound bed without slough. (Active)  08/04/19 2000  Location: Coccyx  Location Orientation: Mid  Staging: Stage II -  Partial thickness loss of dermis presenting as a shallow open ulcer with a red, pink wound bed without slough.  Wound Description (Comments):   Present on Admission:     Procedures/Studies: None  Discharge Diagnoses:  Principal Problem:   COVID-19 virus infection Active Problems:   Weakness   Pressure injury of skin   Discharge Instructions:    Person Under Monitoring Name: Tracy [name]  Location[place] [address] [place] [id number]   Infection Prevention Recommendations for Individuals Confirmed to have, or Being Evaluated for, 2019 Novel Coronavirus (COVID-19) Infection Who Receive Care at Home  Individuals who are confirmed  to have, or are being evaluated for, COVID-19 should follow the prevention steps below until a healthcare provider or local or state health department says they can return to normal activities.  Stay home except to get medical care You should restrict activities outside your home, except for getting medical care. Do not go to work, school, or public areas, and do not use public transportation or taxis.  Call ahead before visiting your doctor Before your medical appointment, call the healthcare provider and tell them that you have, or are being evaluated for, COVID-19 infection. This will help the healthcare providers office take steps to keep other people from getting infected. Ask your healthcare provider to call the local or state health department.  Monitor your symptoms Seek prompt medical attention if your illness is worsening (e.g., difficulty breathing). Before going to your medical appointment, call the healthcare provider and tell them that you have, or are being evaluated for, COVID-19 infection. Ask your healthcare provider to call the local or state health department.  Wear a facemask You should wear a facemask that covers your nose and mouth when you are in the same room with other people and when you visit a healthcare provider. People who live with or visit you should also wear a facemask while they are in the same room with you.  Separate yourself from other people in your home As much as possible, you should stay in a different room from other people in your home. Also, you should use a separate bathroom, if available.  Avoid sharing household items You should not share dishes, drinking glasses, cups, eating utensils, towels, bedding, or other items with other people in your home. After using these items, you should wash them thoroughly with soap and water.  Cover your coughs and sneezes Cover your mouth and nose with a tissue when you cough or sneeze, or you can  cough or sneeze into your sleeve. Throw used tissues in a lined trash can, and immediately wash your hands with soap and water for at least 20 seconds or use an alcohol-based hand rub.  Wash your [employer] Corporationhands Wash your hands often and thoroughly with soap and water for at least 20 seconds. You can use an alcohol-based hand sanitizer if soap and water are not available and if your hands are not visibly dirty. Avoid touching your eyes, nose, and mouth with unwashed hands.   Prevention Steps for Caregivers and Household Members of Individuals Confirmed to have, or Being Evaluated for, COVID-19 Infection Being Cared for in the Home  If you live with, or provide care at home for, a person confirmed to have, or being evaluated for, COVID-19 infection please follow these guidelines to prevent infection:  Follow healthcare providers instructions Make sure that you understand and can help the patient follow any healthcare provider instructions for all care.  Provide for the patients basic needs You should help the patient with basic needs in the home and provide support for getting groceries, prescriptions, and other personal needs.  Monitor the patients symptoms If they are getting sicker, call his or her medical provider and tell them that the patient has, or is being evaluated for, COVID-19 infection. This will help the healthcare providers office take steps to keep other people from  getting infected. Ask the healthcare provider to call the local or state health department.  Limit the number of people who have contact with the patient  If possible, have only one caregiver for the patient.  Other household members should stay in another home or place of residence. If this is not possible, they should stay  in another room, or be separated from the patient as much as possible. Use a separate bathroom, if available.  Restrict visitors who do not have an essential need to be in the  home.  Keep older adults, very young children, and other sick people away from the patient Keep older adults, very young children, and those who have compromised immune systems or chronic health conditions away from the patient. This includes people with chronic heart, lung, or kidney conditions, diabetes, and cancer.  Ensure good ventilation Make sure that shared spaces in the home have good air flow, such as from an air conditioner or an opened window, weather permitting.  Wash your hands often  Wash your hands often and thoroughly with soap and water for at least 20 seconds. You can use an alcohol based hand sanitizer if soap and water are not available and if your hands are not visibly dirty.  Avoid touching your eyes, nose, and mouth with unwashed hands.  Use disposable paper towels to dry your hands. If not available, use dedicated cloth towels and replace them when they become wet.  Wear a facemask and gloves  Wear a disposable facemask at all times in the room and gloves when you touch or have contact with the patients blood, body fluids, and/or secretions or excretions, such as sweat, saliva, sputum, nasal mucus, vomit, urine, or feces.  Ensure the mask fits over your nose and mouth tightly, and do not touch it during use.  Throw out disposable facemasks and gloves after using them. Do not reuse.  Wash your hands immediately after removing your facemask and gloves.  If your personal clothing becomes contaminated, carefully remove clothing and launder. Wash your hands after handling contaminated clothing.  Place all used disposable facemasks, gloves, and other waste in a lined container before disposing them with other household waste.  Remove gloves and wash your hands immediately after handling these items.  Do not share dishes, glasses, or other household items with the patient  Avoid sharing household items. You should not share dishes, drinking glasses, cups, eating  utensils, towels, bedding, or other items with a patient who is confirmed to have, or being evaluated for, COVID-19 infection.  After the person uses these items, you should wash them thoroughly with soap and water.  Wash laundry thoroughly  Immediately remove and wash clothes or bedding that have blood, body fluids, and/or secretions or excretions, such as sweat, saliva, sputum, nasal mucus, vomit, urine, or feces, on them.  Wear gloves when handling laundry from the patient.  Read and follow directions on labels of laundry or clothing items and detergent. In general, wash and dry with the warmest temperatures recommended on the label.  Clean all areas the individual has used often  Clean all touchable surfaces, such as counters, tabletops, doorknobs, bathroom fixtures, toilets, phones, keyboards, tablets, and bedside tables, every day. Also, clean any surfaces that may have blood, body fluids, and/or secretions or excretions on them.  Wear gloves when cleaning surfaces the patient has come in contact with.  Use a diluted bleach solution (e.g., dilute bleach with 1 part bleach and 10 parts water) or a  household disinfectant with a label that says EPA-registered for coronaviruses. To make a bleach solution at home, add 1 tablespoon of bleach to 1 quart (4 cups) of water. For a larger supply, add  cup of bleach to 1 gallon (16 cups) of water.  Read labels of cleaning products and follow recommendations provided on product labels. Labels contain instructions for safe and effective use of the cleaning product including precautions you should take when applying the product, such as wearing gloves or eye protection and making sure you have good ventilation during use of the product.  Remove gloves and wash hands immediately after cleaning.  Monitor yourself for signs and symptoms of illness Caregivers and household members are considered close contacts, should monitor their health, and will be  asked to limit movement outside of the home to the extent possible. Follow the monitoring steps for close contacts listed on the symptom monitoring form.   ? If you have additional questions, contact your local health department or call the epidemiologist on call at [phone] (available 24/7). ? This guidance is subject to change. For the most up-to-date guidance from CDC, please refer to their website: TripMetro.hu    Activity:  As tolerated with Full fall precautions use walker/cane & assistance as needed  Discharge Instructions    Diet - low sodium heart healthy   Complete by: As directed    Diet Carb Modified   Complete by: As directed    Discharge instructions   Complete by: As directed    Follow with Primary MD  Plotnikov, Georgina Quint, MD in 1-2 weeks  Please get a complete blood count and chemistry panel checked by your Primary MD at your next visit, and again as instructed by your Primary MD.  Get Medicines reviewed and adjusted: Please take all your medications with you for your next visit with your Primary MD  Laboratory/radiological data: Please request your Primary MD to go over all hospital tests and procedure/radiological results at the follow up, please ask your Primary MD to get all Hospital records sent to his/her office.  In some cases, they will be blood work, cultures and biopsy results pending at the time of your discharge. Please request that your primary care M.D. follows up on these results.  Also Note the following: If you experience worsening of your admission symptoms, develop shortness of breath, life threatening emergency, suicidal or homicidal thoughts you must seek medical attention immediately by calling 911 or calling your MD immediately  if symptoms less severe.  You must read complete instructions/literature along with all the possible adverse reactions/side effects for all the  Medicines you take and that have been prescribed to you. Take any new Medicines after you have completely understood and accpet all the possible adverse reactions/side effects.   Do not drive when taking Pain medications or sleeping medications (Benzodaizepines)  Do not take more than prescribed Pain, Sleep and Anxiety Medications. It is not advisable to combine anxiety,sleep and pain medications without talking with your primary care practitioner  Special Instructions: If you have smoked or chewed Tobacco  in the last 2 yrs please stop smoking, stop any regular Alcohol  and or any Recreational drug use.  Wear Seat belts while driving.  Please note: You were cared for by a hospitalist during your hospital stay. Once you are discharged, your primary care physician will handle any further medical issues. Please note that NO REFILLS for any discharge medications will be authorized once you are discharged, as it  is imperative that you return to your primary care physician (or establish a relationship with a primary care physician if you do not have one) for your post hospital discharge needs so that they can reassess your need for medications and monitor your lab values.   Increase activity slowly   Complete by: As directed      Allergies as of 08/09/2019      Reactions   Wellbutrin [bupropion] Other (See Comments)   Wt loss   Seroquel [quetiapine Fumarate]    Seroquel is too strong at 1/2 - 1/4 - drooling      Medication List    STOP taking these medications   bisacodyl 5 MG EC tablet Commonly known as: Dulcolax   famotidine 40 MG tablet Commonly known as: Pepcid   gabapentin 100 MG capsule Commonly known as: NEURONTIN   metFORMIN 500 MG tablet Commonly known as: GLUCOPHAGE   spironolactone 25 MG tablet Commonly known as: ALDACTONE     TAKE these medications   aspirin EC 81 MG tablet Take 1 tablet (81 mg total) by mouth daily.   cholecalciferol 1000 units tablet Commonly  known as: VITAMIN D Take 1 tablet (1,000 Units total) by mouth daily.   cyanocobalamin 1000 MCG tablet Commonly known as: [employer] VITAMIN B12 Take 1 tablet (1,000 mcg total) by mouth daily. What changed: how much to take   donepezil 10 MG tablet Commonly known as: ARICEPT TAKE 1 TABLET BY MOUTH EVERYDAY AT BEDTIME   doxepin 25 MG capsule Commonly known as: SINEQUAN Take 1 capsule (25 mg total) by mouth 2 (two) times daily. What changed: how much to take   escitalopram 10 MG tablet Commonly known as: LEXAPRO Take 1 tablet (10 mg total) by mouth daily.   esomeprazole 40 MG capsule Commonly known as: NEXIUM TAKE 1 CAPSULE BY MOUTH EVERY DAY   feeding supplement (ENSURE ENLIVE) Liqd Take 237 mLs by mouth 3 (three) times daily between meals.   glucose blood test strip One touch Verio Flex.  Use as instructed to test sugars daily and prn.  E11.59   lubiprostone 24 MCG capsule Commonly known as: AMITIZA Take 1 capsule (24 mcg total) by mouth 2 (two) times daily as needed for constipation.   meclizine 12.5 MG tablet Commonly known as: ANTIVERT Take 1 tablet (12.5 mg total) by mouth as needed for dizziness.      Follow-up Information    Plotnikov, Georgina Quint, MD. Schedule an appointment as soon as possible for a visit in 2 week(s).   Specialty: Internal Medicine Contact information: [address] [place] [address] [phone]          Allergies  Allergen Reactions   Wellbutrin [Bupropion] Other (See Comments)    Wt loss   Seroquel [Quetiapine Fumarate]     Seroquel is too strong at 1/2 - 1/4 - drooling      Consultations:   None  Other Procedures/Studies: Dg Thoracic Spine 2 View  Result Date: 07/28/2019 CLINICAL DATA:  Fall with midline tenderness, back pain, initial encounter. EXAM: THORACIC SPINE 2 VIEWS COMPARISON:  Two-view chest radiograph 04/25/2015. FINDINGS: Alignment is anatomic. Difficult to exclude very mild compression of the T12 superior  endplate. Multilevel endplate degenerative changes. IMPRESSION: 1. Difficult to exclude very mild compression of the T12 superior endplate. 2. Multilevel degenerative disc disease. Electronically Signed   By: Leanna Battles M.D.   On: 07/28/2019 12:52   Dg Lumbar Spine Complete  Result Date: 07/28/2019 CLINICAL DATA:  78 year old female with fall back pain. EXAM: LUMBAR SPINE - COMPLETE 4+ VIEW COMPARISON:  Abdominal radiograph dated 05/11/2018. FINDINGS: There is no acute fracture or subluxation of the lumbar spine. There is osteopenia with multilevel degenerative changes. Grade 2 L4-L5 anterolisthesis. Degenerative changes at L5-S1 with endplate irregularity and sclerosis. Right upper quadrant cholecystectomy clips. IMPRESSION: 1. No acute fracture or subluxation of the lumbar spine. 2. Multilevel degenerative changes and grade 2 L4 L5 anterolisthesis. Electronically Signed   By: Elgie Collard M.D.   On: 07/28/2019 12:52   Dg Chest Portable 1 View  Result Date: 07/28/2019 CLINICAL DATA:  Recent fall.  Generalized weakness EXAM: PORTABLE CHEST 1 VIEW COMPARISON:  December 15, 2015 FINDINGS: No edema or consolidation. Heart is upper normal in size with pulmonary vascularity normal. No adenopathy. Loop recorder noted on the left. There is degenerative change in each shoulder with superior migration of each humeral head. No pneumothorax. IMPRESSION: No edema or consolidation. Heart upper normal in size. Loop recorder on the left. Suspect chronic rotator cuff tear in each shoulder. Electronically Signed   By: Bretta Bang III M.D.   On: 07/28/2019 11:43     TODAY-DAY OF DISCHARGE:  Subjective:   Kreg Shropshire today was lying comfortably in bed-able to answer a few questions appropriately.  She is still very pleasantly confused.  Objective:   Blood pressure 131/75, pulse 85, temperature 98.2 F (36.8 C), temperature source Axillary, resp. rate 18, height  (1.575 m), weight 59 kg, SpO2  95 %.  Intake/Output Summary (Last 24 hours) at 08/09/2019 1028 Last data filed at 08/09/2019 0823 Gross per 24 hour  Intake 360 ml  Output 1300 ml  Net -940 ml   Filed Weights   08/07/19 0500 08/08/19 0500 08/09/19 0401  Weight: 59.6 kg 60 kg 59 kg    Exam: Awake-pleasantly confused, No new F.N deficits, Normal affect [place].AT,PERRAL Supple Neck,No JVD, No cervical lymphadenopathy appriciated.  Symmetrical Chest wall movement, Good air movement bilaterally, CTAB RRR,No Gallops,Rubs or new Murmurs, No Parasternal Heave +ve B.Sounds, Abd Soft, Non tender, No organomegaly appriciated, No rebound -guarding or rigidity. No Cyanosis, Clubbing or edema, No new Rash or bruise   PERTINENT RADIOLOGIC STUDIES: Dg Thoracic Spine 2 View  Result Date: 07/28/2019 CLINICAL DATA:  Fall with midline tenderness, back pain, initial encounter. EXAM: THORACIC SPINE 2 VIEWS COMPARISON:  Two-view chest radiograph 04/25/2015. FINDINGS: Alignment is anatomic. Difficult to exclude very mild compression of the T12 superior endplate. Multilevel endplate degenerative changes. IMPRESSION: 1. Difficult to exclude very mild compression of the T12 superior endplate. 2. Multilevel degenerative disc disease. Electronically Signed   By: Leanna Battles M.D.   On: 07/28/2019 12:52   Dg Lumbar Spine Complete  Result Date: 07/28/2019 CLINICAL DATA:  78 year old female with fall back pain. EXAM: LUMBAR SPINE - COMPLETE 4+ VIEW COMPARISON:  Abdominal radiograph dated 05/11/2018. FINDINGS: There is no acute fracture or subluxation of the lumbar spine. There is osteopenia with multilevel degenerative changes. Grade 2 L4-L5 anterolisthesis. Degenerative changes at L5-S1 with endplate irregularity and sclerosis. Right upper quadrant cholecystectomy clips. IMPRESSION: 1. No acute fracture or subluxation of the lumbar spine. 2. Multilevel degenerative changes and grade 2 L4 L5 anterolisthesis. Electronically Signed   By: Elgie Collard M.D.   On: 07/28/2019 12:52   Dg Chest Portable 1 View  Result Date: 07/28/2019 CLINICAL DATA:  Recent fall.  Generalized weakness EXAM: PORTABLE CHEST 1 VIEW COMPARISON:  December 15, 2015 FINDINGS: No edema or consolidation.  Heart is upper normal in size with pulmonary vascularity normal. No adenopathy. Loop recorder noted on the left. There is degenerative change in each shoulder with superior migration of each humeral head. No pneumothorax. IMPRESSION: No edema or consolidation. Heart upper normal in size. Loop recorder on the left. Suspect chronic rotator cuff tear in each shoulder. Electronically Signed   By: Bretta Bang III M.D.   On: 07/28/2019 11:43     PERTINENT LAB RESULTS: CBC: Recent Labs    08/07/19 0515  WBC 7.0  HGB 12.2  HCT 36.9  PLT 582*   CMET CMP     Component Value Date/Time   NA 141 08/07/2019 0515   K 3.8 08/07/2019 0515   CL 105 08/07/2019 0515   CO2 26 08/07/2019 0515   GLUCOSE 128 (H) 08/07/2019 0515   BUN 23 08/07/2019 0515   CREATININE 0.77 08/07/2019 0515   CALCIUM 8.4 (L) 08/07/2019 0515   PROT 6.2 (L) 08/07/2019 0515   ALBUMIN 2.7 (L) 08/07/2019 0515   AST 46 (H) 08/07/2019 0515   ALT 56 (H) 08/07/2019 0515   ALKPHOS 59 08/07/2019 0515   BILITOT 0.7 08/07/2019 0515   GFRNONAA >60 08/07/2019 0515   GFRAA >60 08/07/2019 0515    GFR Estimated Creatinine Clearance: 45.8 mL/min (by C-G formula based on SCr of 0.77 mg/dL). No results for input(s): LIPASE, AMYLASE in the last 72 hours. No results for input(s): CKTOTAL, CKMB, CKMBINDEX, TROPONINI in the last 72 hours. Invalid input(s): POCBNP Recent Labs    08/07/19 0515  DDIMER 0.66*   No results for input(s): HGBA1C in the last 72 hours. No results for input(s): CHOL, HDL, LDLCALC, TRIG, CHOLHDL, LDLDIRECT in the last 72 hours. No results for input(s): TSH, T4TOTAL, T3FREE, THYROIDAB in the last 72 hours.  Invalid input(s): FREET3 Recent Labs    08/07/19 0515  08/08/19 0255  FERRITIN 334* 273   Coags: No results for input(s): INR in the last 72 hours.  Invalid input(s): PT Microbiology: No results found for this or any previous visit (from the past 240 hour(s)).  FURTHER DISCHARGE INSTRUCTIONS:  Get Medicines reviewed and adjusted: Please take all your medications with you for your next visit with your Primary MD  Laboratory/radiological data: Please request your Primary MD to go over all hospital tests and procedure/radiological results at the follow up, please ask your Primary MD to get all Hospital records sent to his/her office.  In some cases, they will be blood work, cultures and biopsy results pending at the time of your discharge. Please request that your primary care M.D. goes through all the records of your hospital data and follows up on these results.  Also Note the following: If you experience worsening of your admission symptoms, develop shortness of breath, life threatening emergency, suicidal or homicidal thoughts you must seek medical attention immediately by calling 911 or calling your MD immediately  if symptoms less severe.  You must read complete instructions/literature along with all the possible adverse reactions/side effects for all the Medicines you take and that have been prescribed to you. Take any new Medicines after you have completely understood and accpet all the possible adverse reactions/side effects.   Do not drive when taking Pain medications or sleeping medications (Benzodaizepines)  Do not take more than prescribed Pain, Sleep and Anxiety Medications. It is not advisable to combine anxiety,sleep and pain medications without talking with your primary care practitioner  Special Instructions: If you have smoked or chewed Tobacco  in  the last 2 yrs please stop smoking, stop any regular Alcohol  and or any Recreational drug use.  Wear Seat belts while driving.  Please note: You were cared for by a  hospitalist during your hospital stay. Once you are discharged, your primary care physician will handle any further medical issues. Please note that NO REFILLS for any discharge medications will be authorized once you are discharged, as it is imperative that you return to your primary care physician (or establish a relationship with a primary care physician if you do not have one) for your post hospital discharge needs so that they can reassess your need for medications and monitor your lab values.  Total Time spent coordinating discharge including counseling, education and face to face time equals 35 minutes.  SignedJeoffrey Massed 08/09/2019 10:28 AM

## 2019-08-09 NOTE — Progress Notes (Signed)
CBG 65 gave 8oz oj recheck 71. No s/s of hypoglycemia.

## 2019-08-11 ENCOUNTER — Other Ambulatory Visit: Payer: Self-pay

## 2019-08-11 ENCOUNTER — Telehealth: Payer: Self-pay

## 2019-08-11 NOTE — Telephone Encounter (Signed)
Released to SNF: [facility] for Rehab.   Pt is on TCM list after admission for generalized weakness and compression fracture of T12 vertebra.

## 2019-08-11 NOTE — Patient Outreach (Signed)
  [facility]) Care Management Chronic Special Needs Program    08/11/2019  Name: ANAHITA CUA, DOB: 1941/06/22  MRN: [id number]   Ms. Olamide Lahaie is enrolled in a chronic special needs plan for Diabetes. Client discharged from [facility] to [facility] on 08/09/19 for rehab after admitted for generalized weakness and  COVID 19  Plan to follow up with [employer] utilization management post acute team for updates on client  Peter Garter RN, Jackquline Denmark, [place] Management [phone]

## 2019-08-12 DIAGNOSIS — J9601 Acute respiratory failure with hypoxia: Secondary | ICD-10-CM | POA: Diagnosis not present

## 2019-08-12 DIAGNOSIS — J1289 Other viral pneumonia: Secondary | ICD-10-CM | POA: Diagnosis not present

## 2019-08-12 DIAGNOSIS — E46 Unspecified protein-calorie malnutrition: Secondary | ICD-10-CM | POA: Diagnosis not present

## 2019-08-12 DIAGNOSIS — U071 COVID-19: Secondary | ICD-10-CM | POA: Diagnosis not present

## 2019-08-13 ENCOUNTER — Encounter (HOSPITAL_COMMUNITY): Payer: Self-pay | Admitting: Emergency Medicine

## 2019-08-13 ENCOUNTER — Emergency Department (HOSPITAL_COMMUNITY): Payer: HMO

## 2019-08-13 ENCOUNTER — Other Ambulatory Visit: Payer: Self-pay

## 2019-08-13 ENCOUNTER — Emergency Department (HOSPITAL_COMMUNITY)
Admission: EM | Admit: 2019-08-13 | Discharge: 2019-08-14 | Disposition: A | Discharge status: Home / Self Care | Payer: HMO | Attending: Emergency Medicine | Admitting: Emergency Medicine

## 2019-08-13 DIAGNOSIS — I10 Essential (primary) hypertension: Secondary | ICD-10-CM | POA: Insufficient documentation

## 2019-08-13 DIAGNOSIS — F0391 Unspecified dementia with behavioral disturbance: Secondary | ICD-10-CM | POA: Diagnosis not present

## 2019-08-13 DIAGNOSIS — Y939 Activity, unspecified: Secondary | ICD-10-CM | POA: Diagnosis not present

## 2019-08-13 DIAGNOSIS — S199XXA Unspecified injury of neck, initial encounter: Secondary | ICD-10-CM | POA: Diagnosis not present

## 2019-08-13 DIAGNOSIS — J9601 Acute respiratory failure with hypoxia: Secondary | ICD-10-CM | POA: Diagnosis not present

## 2019-08-13 DIAGNOSIS — U071 COVID-19: Secondary | ICD-10-CM | POA: Diagnosis not present

## 2019-08-13 DIAGNOSIS — W1830XA Fall on same level, unspecified, initial encounter: Secondary | ICD-10-CM | POA: Diagnosis not present

## 2019-08-13 DIAGNOSIS — Z8673 Personal history of transient ischemic attack (TIA), and cerebral infarction without residual deficits: Secondary | ICD-10-CM | POA: Insufficient documentation

## 2019-08-13 DIAGNOSIS — S0093XA Contusion of unspecified part of head, initial encounter: Secondary | ICD-10-CM | POA: Diagnosis not present

## 2019-08-13 DIAGNOSIS — S0083XA Contusion of other part of head, initial encounter: Secondary | ICD-10-CM | POA: Diagnosis not present

## 2019-08-13 DIAGNOSIS — E118 Type 2 diabetes mellitus with unspecified complications: Secondary | ICD-10-CM | POA: Diagnosis not present

## 2019-08-13 DIAGNOSIS — W19XXXA Unspecified fall, initial encounter: Secondary | ICD-10-CM

## 2019-08-13 DIAGNOSIS — Z79899 Other long term (current) drug therapy: Secondary | ICD-10-CM | POA: Diagnosis not present

## 2019-08-13 DIAGNOSIS — F0280 Dementia in other diseases classified elsewhere without behavioral disturbance: Secondary | ICD-10-CM | POA: Diagnosis not present

## 2019-08-13 DIAGNOSIS — Y999 Unspecified external cause status: Secondary | ICD-10-CM | POA: Diagnosis not present

## 2019-08-13 DIAGNOSIS — Y92129 Unspecified place in nursing home as the place of occurrence of the external cause: Secondary | ICD-10-CM | POA: Diagnosis not present

## 2019-08-13 DIAGNOSIS — S0990XA Unspecified injury of head, initial encounter: Secondary | ICD-10-CM | POA: Diagnosis not present

## 2019-08-13 DIAGNOSIS — E119 Type 2 diabetes mellitus without complications: Secondary | ICD-10-CM | POA: Insufficient documentation

## 2019-08-13 NOTE — ED Triage Notes (Signed)
Pt arrived via [facility] from [facility] due to a fall. Pt hit her head upon impact, C-spine precautions initiated.   BP 124/80 HR 88 RR 16 Temp 98.2  CBG 173

## 2019-08-13 NOTE — ED Notes (Signed)
Patient transported to CT 

## 2019-08-13 NOTE — Patient Outreach (Signed)
  [facility]) Care Management Chronic Special Needs Program   08/13/2019  Name: Tracy [name], DOB: [birth year]  MRN: [id number]  The client was discussed in today's interdisciplinary care team meeting.  The following issues were discussed:  Changes in health status, Care Plan, Coordination of care, Care transitions and Issues/barriers to care  Participants present:   Thea Silversmith, MSN, RN, CCM   Melissa Sandlin RN,BSN,CCM, CDE  Quinn Plowman RN, BSN, CCM  Marco Collie, MD Maryella Shivers, MD  Gilda Crease, PharmD, RPh Bary Castilla, RN, BSN, MS, CCM Coralie Carpen, MD [place] Care CBCS/CMAA   Plan:  [employer] utilization management post acute team to follow client while in SNF at [facility] and will update RNCM on client's progress and discharge plans  Follow-up: RNCM will continue to monitor progress and outreach as scheduled and as needed  [place], Jackquline Denmark, [place] Management [phone]

## 2019-08-14 NOTE — Discharge Instructions (Addendum)
Follow up as needed

## 2019-08-14 NOTE — ED Provider Notes (Signed)
[facility] DEPT Provider Note   CSN: [id number] Arrival date & time: 08/13/19  2023     History   Chief Complaint Chief Complaint  Patient presents with   Fall    HPI Tracy [name] is a 78 y.o. female.     Patient fell today and hit her head.  No loss of consciousness.  The history is provided by the EMS personnel and medical records.  Fall This is a new problem. The current episode started 3 to 5 hours ago. The problem occurs rarely. The problem has been resolved. Pertinent negatives include no chest pain, no abdominal pain and no headaches. Nothing aggravates the symptoms. She has tried nothing for the symptoms. The treatment provided no relief.    Past Medical History:  Diagnosis Date   Acute cholecystitis 08/10/2013   Lap chole on 08/13/13    Anxiety    Depression    Diabetes mellitus without complication ([place])    Hypertension    Restless leg syndrome    Stroke [facility])     Patient Active Problem List   Diagnosis Date Noted   Pressure injury of skin 08/05/2019   COVID-19 virus infection 07/30/2019   Weakness 07/28/2019   Hematochezia 07/31/2017   Well adult exam 06/20/2017   Abdominal pain 02/26/2017   Insomnia 02/26/2017   Cerumen impaction 12/04/2016   Cervical disc disorder with radiculopathy of cervical region 09/22/2016   Left rotator cuff tear arthropathy 09/22/2016   Left shoulder pain 09/01/2016   Left wrist pain 09/01/2016   GERD (gastroesophageal reflux disease) 04/25/2016   Onychomycosis 03/06/2016   Viral illness 12/21/2015   Blood in stool 11/30/2015   Tension headache 10/08/2015   Subdural hematoma ([place]) 02/05/2015   Palpitations 01/18/2015   HLD (hyperlipidemia) 01/12/2015   Stroke ([place]) 01/12/2015   Anemia, iron deficiency 12/24/2014   Generalized anxiety disorder 11/27/2014   Abscess of right buttock 11/04/2014   Diabetes mellitus type 2 in obese ([place])    SIRS  (systemic inflammatory response syndrome) ([place]) 27/74/1287   Umbilical hernia 86/76/7209   Dementia with aggressive behavior ([place]) 02/03/2014   Loss of weight 10/06/2013   Constipation 10/06/2013   HTN (hypertension) 08/10/2013   History of CVA (cerebrovascular accident) 08/10/2013    Past Surgical History:  Procedure Laterality Date   CHOLECYSTECTOMY N/A 08/13/2013   Procedure: LAPAROSCOPIC CHOLECYSTECTOMY ;  Surgeon: Haywood Lasso, MD;  Location: [facility];  Service: General;  Laterality: N/A;   EP IMPLANTABLE DEVICE N/A 06/02/2015   Procedure: Loop Recorder Insertion;  Surgeon: Deboraha Sprang, MD;  Location: [facility] CV LAB;  Service: Cardiovascular;  Laterality: N/A;   ERCP N/A 08/12/2013   Procedure: ENDOSCOPIC RETROGRADE CHOLANGIOPANCREATOGRAPHY (ERCP);  Surgeon: Beryle Beams, MD;  Location[facility] ENDOSCOPY;  Service: Endoscopy;  Laterality: N/A;   INCISIONAL HERNIA REPAIR N/A 08/20/2013   Procedure: HERNIA REPAIR INCISIONAL UMBILICAL;  Surgeon: Earnstine Regal, MD;  Location: [facility];  Service: General;  Laterality: N/A;   SPHINCTEROTOMY  08/12/2013   Procedure: Joan Mayans;  Surgeon: Beryle Beams, MD;  Location: [facility] ENDOSCOPY;  Service: Endoscopy;;     OB History   No obstetric history on file.      Home Medications    Prior to Admission medications   Medication Sig Start Date End Date Taking? Authorizing Provider  aspirin EC 81 MG tablet Take 1 tablet (81 mg total) by mouth daily. 08/09/19  Yes Ghimire, Henreitta Leber, MD  cholecalciferol (VITAMIN D) 1000  units tablet Take 1 tablet (1,000 Units total) by mouth daily. 08/09/19  Yes Ghimire, Werner Lean, MD  cyanocobalamin ([employer] VITAMIN B12) 1000 MCG tablet Take 1 tablet (1,000 mcg total) by mouth daily. 08/09/19  Yes Ghimire, Werner Lean, MD  donepezil (ARICEPT) 10 MG tablet TAKE 1 TABLET BY MOUTH EVERYDAY AT BEDTIME 08/09/19  Yes Ghimire, Werner Lean, MD  doxepin (SINEQUAN) 25 MG capsule Take 1 capsule (25 mg total) by  mouth 2 (two) times daily. 08/09/19  Yes Ghimire, Werner Lean, MD  escitalopram (LEXAPRO) 10 MG tablet Take 1 tablet (10 mg total) by mouth daily. 08/09/19  Yes Ghimire, Werner Lean, MD  esomeprazole (NEXIUM) 40 MG capsule TAKE 1 CAPSULE BY MOUTH EVERY DAY 05/26/19  Yes Plotnikov, Georgina Quint, MD  feeding supplement, ENSURE ENLIVE, (ENSURE ENLIVE) LIQD Take 237 mLs by mouth 3 (three) times daily between meals. 08/09/19  Yes Ghimire, Werner Lean, MD  lubiprostone (AMITIZA) 24 MCG capsule Take 1 capsule (24 mcg total) by mouth 2 (two) times daily as needed for constipation. 08/09/19  Yes Ghimire, Werner Lean, MD  meclizine (ANTIVERT) 12.5 MG tablet Take 1 tablet (12.5 mg total) by mouth as needed for dizziness. 08/09/19  Yes Ghimire, Werner Lean, MD  glucose blood test strip One touch Verio Flex.  Use as instructed to test sugars daily and prn.  E11.59 11/19/15   Pincus Sanes, MD    Family History Family History  Problem Relation Age of Onset   Stroke Mother     Social History Social History   Tobacco Use   Smoking status: Never Smoker   Smokeless tobacco: Never Used  Substance Use Topics   Alcohol use: No   Drug use: No     Allergies   Wellbutrin [bupropion] and Seroquel [quetiapine fumarate]   Review of Systems Review of Systems  Constitutional: Negative for appetite change and fatigue.  HENT: Negative for congestion, ear discharge and sinus pressure.   Eyes: Negative for discharge.  Respiratory: Negative for cough.   Cardiovascular: Negative for chest pain.  Gastrointestinal: Negative for abdominal pain and diarrhea.  Genitourinary: Negative for frequency and hematuria.  Musculoskeletal: Negative for back pain.  Skin: Negative for rash.  Neurological: Negative for seizures and headaches.  Psychiatric/Behavioral: Negative for hallucinations.     Physical Exam Updated Vital Signs BP 123/68 (BP Location: Right Arm)    Pulse 80    Temp 98.2 F (36.8 C) (Oral)    Resp 14    Ht  5\' 2"  (1.575 m)    Wt 60 kg    SpO2 96%    BMI 24.19 kg/m   Physical Exam Vitals signs and nursing note reviewed.  Constitutional:      Appearance: She is well-developed.  HENT:     Head: Normocephalic.     Nose: Nose normal.  Eyes:     General: No scleral icterus.    Conjunctiva/sclera: Conjunctivae normal.  Neck:     Musculoskeletal: Neck supple.     Thyroid: No thyromegaly.  Cardiovascular:     Rate and Rhythm: Normal rate and regular rhythm.     Heart sounds: No murmur. No friction rub. No gallop.   Pulmonary:     Breath sounds: No stridor. No wheezing or rales.  Chest:     Chest wall: No tenderness.  Abdominal:     General: There is no distension.     Tenderness: There is no abdominal tenderness. There is no rebound.  Musculoskeletal: Normal range of motion.  Lymphadenopathy:     Cervical: No cervical adenopathy.  Skin:    Findings: No erythema or rash.  Neurological:     Mental Status: She is alert and oriented to person, place, and time.     Motor: No abnormal muscle tone.     Coordination: Coordination normal.  Psychiatric:        Behavior: Behavior normal.      ED Treatments / Results  Labs (all labs ordered are listed, but only abnormal results are displayed) Labs Reviewed - No data to display  EKG None  Radiology Ct Head Wo Contrast  Result Date: 08/13/2019 CLINICAL DATA:  78 year old female with fall. EXAM: CT HEAD WITHOUT CONTRAST CT CERVICAL SPINE WITHOUT CONTRAST TECHNIQUE: Multidetector CT imaging of the head and cervical spine was performed following the standard protocol without intravenous contrast. Multiplanar CT image reconstructions of the cervical spine were also generated. COMPARISON:  Head CT dated 02/06/2015. FINDINGS: CT HEAD FINDINGS Brain: There is mild age-related atrophy and chronic microvascular ischemic changes. Small old left basal ganglia lacunar infarct. There is no acute intracranial hemorrhage. No mass effect or midline shift  no extra-axial fluid collection. Vascular: No hyperdense vessel or unexpected calcification. Skull: Normal. Negative for fracture or focal lesion. Sinuses/Orbits: Mild mucoperiosteal thickening of paranasal sinuses. No air-fluid level. The mastoid air cells are clear. Other: None CT CERVICAL SPINE FINDINGS Alignment: No acute subluxation. Skull base and vertebrae: No acute fracture. Soft tissues and spinal canal: No prevertebral fluid or swelling. No visible canal hematoma. Disc levels: Multilevel degenerative changes with endplate irregularity and disc space narrowing. Grade 1 C3-C4 retrolisthesis and C7-T1 anterolisthesis. Upper chest: Negative. Other: None IMPRESSION: 1. No acute intracranial hemorrhage. Mild age-related atrophy and chronic microvascular ischemic changes. 2. No acute/traumatic cervical spine pathology. Multilevel degenerative changes. Electronically Signed   By: Elgie CollardArash  Radparvar M.D.   On: 08/13/2019 21:23   Ct Cervical Spine Wo Contrast  Result Date: 08/13/2019 CLINICAL DATA:  78 year old female with fall. EXAM: CT HEAD WITHOUT CONTRAST CT CERVICAL SPINE WITHOUT CONTRAST TECHNIQUE: Multidetector CT imaging of the head and cervical spine was performed following the standard protocol without intravenous contrast. Multiplanar CT image reconstructions of the cervical spine were also generated. COMPARISON:  Head CT dated 02/06/2015. FINDINGS: CT HEAD FINDINGS Brain: There is mild age-related atrophy and chronic microvascular ischemic changes. Small old left basal ganglia lacunar infarct. There is no acute intracranial hemorrhage. No mass effect or midline shift no extra-axial fluid collection. Vascular: No hyperdense vessel or unexpected calcification. Skull: Normal. Negative for fracture or focal lesion. Sinuses/Orbits: Mild mucoperiosteal thickening of paranasal sinuses. No air-fluid level. The mastoid air cells are clear. Other: None CT CERVICAL SPINE FINDINGS Alignment: No acute subluxation.  Skull base and vertebrae: No acute fracture. Soft tissues and spinal canal: No prevertebral fluid or swelling. No visible canal hematoma. Disc levels: Multilevel degenerative changes with endplate irregularity and disc space narrowing. Grade 1 C3-C4 retrolisthesis and C7-T1 anterolisthesis. Upper chest: Negative. Other: None IMPRESSION: 1. No acute intracranial hemorrhage. Mild age-related atrophy and chronic microvascular ischemic changes. 2. No acute/traumatic cervical spine pathology. Multilevel degenerative changes. Electronically Signed   By: Elgie CollardArash  Radparvar M.D.   On: 08/13/2019 21:23    Procedures Procedures (including critical care time)  Medications Ordered in ED Medications - No data to display   Initial Impression / Assessment and Plan / ED Course  I have reviewed the triage vital signs and the nursing notes.  Pertinent labs & imaging results that were available  during my care of the patient were reviewed by me and considered in my medical decision making (see chart for details).        Patient with a fall mild contusion to head.  CT head cervical spine negative.  She is discharged home  Final Clinical Impressions(s) / ED Diagnoses   Final diagnoses:  Fall, initial encounter    ED Discharge Orders    None       Bethann Berkshire, MD 08/14/19 0004

## 2019-08-14 NOTE — ED Notes (Signed)
Called [facility] for transport to [place].

## 2019-08-18 DIAGNOSIS — R296 Repeated falls: Secondary | ICD-10-CM | POA: Diagnosis not present

## 2019-08-18 DIAGNOSIS — U071 COVID-19: Secondary | ICD-10-CM | POA: Diagnosis not present

## 2019-08-18 DIAGNOSIS — J9601 Acute respiratory failure with hypoxia: Secondary | ICD-10-CM | POA: Diagnosis not present

## 2019-08-18 DIAGNOSIS — F0280 Dementia in other diseases classified elsewhere without behavioral disturbance: Secondary | ICD-10-CM | POA: Diagnosis not present

## 2019-08-21 DIAGNOSIS — L89153 Pressure ulcer of sacral region, stage 3: Secondary | ICD-10-CM | POA: Diagnosis not present

## 2019-08-22 DIAGNOSIS — F0280 Dementia in other diseases classified elsewhere without behavioral disturbance: Secondary | ICD-10-CM | POA: Diagnosis not present

## 2019-08-22 DIAGNOSIS — U071 COVID-19: Secondary | ICD-10-CM | POA: Diagnosis not present

## 2019-08-22 DIAGNOSIS — R296 Repeated falls: Secondary | ICD-10-CM | POA: Diagnosis not present

## 2019-08-22 DIAGNOSIS — G9341 Metabolic encephalopathy: Secondary | ICD-10-CM | POA: Diagnosis not present

## 2019-08-26 ENCOUNTER — Other Ambulatory Visit: Payer: Self-pay

## 2019-08-26 NOTE — Patient Outreach (Signed)
  [facility]) Care Management Chronic Special Needs Program    08/26/2019  Name: Tracy [name], DOB: 05-Jul-1941  MRN: [id number]   Tracy [name] is enrolled in a chronic special needs plan for Diabetes. Update on clients stay at [facility] received from [facility] utilization management post acute team Member is making slow progress due to her severe dementia and she has had a fall almost every day at facility.  Family has decided she needs LTC/custodial placement and she cannot go home.  Family looking for facility that will accept Medicaid as [facility] does not have any beds available RNCM will continue to follow clients progress  Peter Garter RN, Jackquline Denmark, [place] Management [phone]

## 2019-08-29 DIAGNOSIS — F0280 Dementia in other diseases classified elsewhere without behavioral disturbance: Secondary | ICD-10-CM | POA: Diagnosis not present

## 2019-08-29 DIAGNOSIS — J9601 Acute respiratory failure with hypoxia: Secondary | ICD-10-CM | POA: Diagnosis not present

## 2019-08-29 DIAGNOSIS — K219 Gastro-esophageal reflux disease without esophagitis: Secondary | ICD-10-CM | POA: Diagnosis not present

## 2019-08-29 DIAGNOSIS — I6789 Other cerebrovascular disease: Secondary | ICD-10-CM | POA: Diagnosis not present

## 2019-08-29 DIAGNOSIS — S22080A Wedge compression fracture of T11-T12 vertebra, initial encounter for closed fracture: Secondary | ICD-10-CM | POA: Diagnosis not present

## 2019-08-29 DIAGNOSIS — R296 Repeated falls: Secondary | ICD-10-CM | POA: Diagnosis not present

## 2019-08-29 DIAGNOSIS — U071 COVID-19: Secondary | ICD-10-CM | POA: Diagnosis not present

## 2019-08-29 DIAGNOSIS — L89152 Pressure ulcer of sacral region, stage 2: Secondary | ICD-10-CM | POA: Diagnosis not present

## 2019-09-01 DIAGNOSIS — U071 COVID-19: Secondary | ICD-10-CM | POA: Diagnosis not present

## 2019-09-03 ENCOUNTER — Other Ambulatory Visit: Payer: Self-pay

## 2019-09-03 DIAGNOSIS — F0151 Vascular dementia with behavioral disturbance: Secondary | ICD-10-CM | POA: Diagnosis not present

## 2019-09-03 DIAGNOSIS — F331 Major depressive disorder, recurrent, moderate: Secondary | ICD-10-CM | POA: Diagnosis not present

## 2019-09-03 DIAGNOSIS — Z8673 Personal history of transient ischemic attack (TIA), and cerebral infarction without residual deficits: Secondary | ICD-10-CM | POA: Diagnosis not present

## 2019-09-03 DIAGNOSIS — F419 Anxiety disorder, unspecified: Secondary | ICD-10-CM | POA: Diagnosis not present

## 2019-09-03 DIAGNOSIS — E1169 Type 2 diabetes mellitus with other specified complication: Secondary | ICD-10-CM | POA: Diagnosis not present

## 2019-09-03 DIAGNOSIS — G2581 Restless legs syndrome: Secondary | ICD-10-CM | POA: Diagnosis not present

## 2019-09-03 NOTE — Patient Outreach (Signed)
  [facility]) Care Management Chronic Special Needs Program  09/03/2019  Name: Tracy [name] DOB: December 03, 1940  MRN: [id number]  [name] is enrolled in a chronic special needs plan for Diabetes. Reviewed and updated care plan.  Client discharged from skilled nursing care at [facility] to [facility] term care Called  Daughter [name] Designated Party Release HIPAA verified States that clients Medicare is no longer paying for client at the nursing home.  States she is unable to come home.   States she is trying to get Medicaid for client and move her to [facility].  States she is having a lot of stress at this time Goals Addressed            This Visit's Progress   . Advanced Care Planning complete by  next 9 months   Not on track   . Client understands the importance of follow-up with providers by attending scheduled visits   On track   . Client will not report change from baseline and no repeated symptoms of stroke with in the next 6 months(continue 06/24/19)   On track   . Client will report no fall or injuries in the next 6 months(continue 06/24/19)   Not on track   . Client will use Assistive Devices as needed and verbalize understanding of device use   On track   . Client will verbalize knowledge of self management of Hypertension as evidences by BP reading of 140/90 or less; or as defined by provider   On track   . Client/Caregiver will verbalize understanding of instructions related to self-care and safety   On track   . General - Client will not be readmitted within 30 days (C-SNP)discharged from skilled care on 09/01/19)      . Obtain annual  Lipid Profile, LDL-C   On track   . Obtain Annual Eye (retinal)  Exam    Not on track   . Obtain Annual Foot Exam   Not on track   . Obtain annual screen for micro albuminuria (urine) , nephropathy (kidney problems)   On track   . Obtain Hemoglobin A1C at least 2 times per year   On track   . Visit Primary  Care Provider or Endocrinologist at least 2 times per year    On track    Daughter agreeable to referral to social worker to help assist her with Medicaid application and her stress  Plan:  Send successful outreach letter with a copy of their individualized care plan and Send individual care plan to provider  Chronic care management coordinator will outreach as scheduled per tier level  Will refer to:  Social Work   Peter Garter RN, [employer], [place] Programmer, multimedia Care Management [phone]

## 2019-09-04 DIAGNOSIS — L89153 Pressure ulcer of sacral region, stage 3: Secondary | ICD-10-CM | POA: Diagnosis not present

## 2019-09-08 ENCOUNTER — Encounter: Payer: Self-pay | Admitting: *Deleted

## 2019-09-08 ENCOUNTER — Other Ambulatory Visit: Payer: Self-pay | Admitting: *Deleted

## 2019-09-08 DIAGNOSIS — R630 Anorexia: Secondary | ICD-10-CM | POA: Diagnosis not present

## 2019-09-08 DIAGNOSIS — R05 Cough: Secondary | ICD-10-CM | POA: Diagnosis not present

## 2019-09-08 DIAGNOSIS — U071 COVID-19: Secondary | ICD-10-CM | POA: Diagnosis not present

## 2019-09-08 DIAGNOSIS — R4189 Other symptoms and signs involving cognitive functions and awareness: Secondary | ICD-10-CM | POA: Diagnosis not present

## 2019-09-08 DIAGNOSIS — E860 Dehydration: Secondary | ICD-10-CM | POA: Diagnosis not present

## 2019-09-08 DIAGNOSIS — J1289 Other viral pneumonia: Secondary | ICD-10-CM | POA: Diagnosis not present

## 2019-09-08 DIAGNOSIS — E118 Type 2 diabetes mellitus with unspecified complications: Secondary | ICD-10-CM | POA: Diagnosis not present

## 2019-09-08 NOTE — Patient Outreach (Signed)
[name]) Care Management  09/08/2019  Tracy [name] [birth year] [id number]  CSW was able to make initial contact with patient's daughter and designated party, Tracy [name] [phone]) today, to perform the phone assessment on patient, as well as assess and assist with social work needs and services.  CSW introduced self, explained role and types of services provided through [employer] Care Management [facility] Care Management).  CSW further explained to Tracy [name] that CSW works with patient's RNCM, also with [facility] Care Management, Tracy [name].  CSW then explained the reason for the call, indicating that Mrs. [name] thought that Tracy [name] would benefit from social work services and resources to assist with long-term care placement arrangements for patient into a skilled nursing facility, as well as assist Tracy [name] with pursuing guardianship for patient.  CSW obtained two HIPAA compliant identifiers from Tracy [name], which included patient's name and date of birth.  Tracy [name] stated, "This has been an extremely stressful process, but things are finally coming together".  Tracy [name] went on to explain that patient will be moving from [facility], Skilled Nursing Facility where patient was receiving short-term rehabilitative services, to [facility], Skilled Nursing Facility where patient will receive long-term care services, this afternoon (Monday, September 08, 2019).  Tracy [name] reported that she has already completed an application for Special Assistance Long-Term Care Medicaid, through the ([employer]) [facility] Department of Social Services for patient, but that the application is still pending.  Tracy [name], Admissions Coordinator at [facility], has agreed to follow-up with patient's Medicaid Case Worker at [facility], regarding approval and payment arrangements.  Tracy [name] admitted that  her siblings are very angry with her because they want to be able to bring patient home, but Tracy [name] indicated that none of them are equipped to properly care for patient.  Tracy [name] further reported that patient has end-stage dementia, unable to perform activities of daily living independently, now requiring 24 hour care and supervision.  Tracy [name] stated, "My mother is not herself, the Coronavirus really took a toll on her body, she's not responding to me and she is barely eating anything, I believe it's just a matter of time".  CSW was able to provide brief counseling and supportive services to Tracy [name] as she discussed the recent deaths of her father and brother, both due to complications associated with COVID-19.  Tracy [name] has already started the guardianship process with the [facility] and requested that patient's Primary Care Physician, Tracy [name] write a letter of support on her behalf.  Tracy [name] reported that she will have her first telephonic interview with a representative from the [facility] on Tuesday, September 16, 2019, and if approved, will receive temporary guardianship of patient for 45 days.  Tracy [name] will then have a second guardianship hearing on Tuesday, November 04, 2019 to obtain approval for full guardianship of patient.  CSW inquired as to how CSW could be of assistance to Tracy [name] at this time.  Tracy [name] denied being able to identify any social work specific needs at present, but agreed to take down CSW's contact information and contact CSW directly if additional social work needs arise in the near future.  CSW will perform a case closure on patient.  CSW will notify Mrs. [name] of CSW's plans to close patient's case.  CSW will also fax an update to Tracy [name]  to ensure that he is aware of CSW's involvement with patient's plan of care.    Nat Christen, BSW, MSW, LCSW  [employer] [facility]  Mailing [place], [place], [place] [address] Physical Address-[address], [place], [place] [address] Toll Free Main # [phone] Fax # [phone] Cell # [phone]  Office # [phone] [email or web address]

## 2019-09-09 ENCOUNTER — Emergency Department (HOSPITAL_COMMUNITY): Payer: HMO

## 2019-09-09 ENCOUNTER — Inpatient Hospital Stay (HOSPITAL_COMMUNITY): Payer: HMO

## 2019-09-09 ENCOUNTER — Encounter (HOSPITAL_COMMUNITY): Payer: Self-pay | Admitting: Nephrology

## 2019-09-09 ENCOUNTER — Inpatient Hospital Stay (HOSPITAL_COMMUNITY)
Admission: EM | Admit: 2019-09-09 | Discharge: 2019-09-21 | DRG: 871 | Disposition: A | Discharge status: Hospice | Payer: HMO | Attending: Family Medicine | Admitting: Family Medicine

## 2019-09-09 DIAGNOSIS — R9431 Abnormal electrocardiogram [ECG] [EKG]: Secondary | ICD-10-CM | POA: Diagnosis not present

## 2019-09-09 DIAGNOSIS — Z8673 Personal history of transient ischemic attack (TIA), and cerebral infarction without residual deficits: Secondary | ICD-10-CM | POA: Diagnosis not present

## 2019-09-09 DIAGNOSIS — F0391 Unspecified dementia with behavioral disturbance: Secondary | ICD-10-CM | POA: Diagnosis not present

## 2019-09-09 DIAGNOSIS — E119 Type 2 diabetes mellitus without complications: Secondary | ICD-10-CM | POA: Diagnosis present

## 2019-09-09 DIAGNOSIS — E860 Dehydration: Secondary | ICD-10-CM | POA: Diagnosis present

## 2019-09-09 DIAGNOSIS — Z66 Do not resuscitate: Secondary | ICD-10-CM | POA: Diagnosis not present

## 2019-09-09 DIAGNOSIS — N390 Urinary tract infection, site not specified: Secondary | ICD-10-CM | POA: Diagnosis present

## 2019-09-09 DIAGNOSIS — Z79899 Other long term (current) drug therapy: Secondary | ICD-10-CM

## 2019-09-09 DIAGNOSIS — E8770 Fluid overload, unspecified: Secondary | ICD-10-CM | POA: Diagnosis not present

## 2019-09-09 DIAGNOSIS — Z515 Encounter for palliative care: Secondary | ICD-10-CM | POA: Diagnosis not present

## 2019-09-09 DIAGNOSIS — F329 Major depressive disorder, single episode, unspecified: Secondary | ICD-10-CM | POA: Diagnosis present

## 2019-09-09 DIAGNOSIS — A419 Sepsis, unspecified organism: Secondary | ICD-10-CM | POA: Diagnosis present

## 2019-09-09 DIAGNOSIS — F419 Anxiety disorder, unspecified: Secondary | ICD-10-CM | POA: Diagnosis present

## 2019-09-09 DIAGNOSIS — E1165 Type 2 diabetes mellitus with hyperglycemia: Secondary | ICD-10-CM | POA: Diagnosis not present

## 2019-09-09 DIAGNOSIS — D689 Coagulation defect, unspecified: Secondary | ICD-10-CM | POA: Diagnosis not present

## 2019-09-09 DIAGNOSIS — R7401 Elevation of levels of liver transaminase levels: Secondary | ICD-10-CM

## 2019-09-09 DIAGNOSIS — Z7189 Other specified counseling: Secondary | ICD-10-CM | POA: Diagnosis not present

## 2019-09-09 DIAGNOSIS — K7200 Acute and subacute hepatic failure without coma: Secondary | ICD-10-CM | POA: Diagnosis not present

## 2019-09-09 DIAGNOSIS — R6520 Severe sepsis without septic shock: Secondary | ICD-10-CM | POA: Diagnosis present

## 2019-09-09 DIAGNOSIS — R52 Pain, unspecified: Secondary | ICD-10-CM | POA: Diagnosis not present

## 2019-09-09 DIAGNOSIS — E870 Hyperosmolality and hypernatremia: Secondary | ICD-10-CM | POA: Diagnosis present

## 2019-09-09 DIAGNOSIS — R000 Tachycardia, unspecified: Secondary | ICD-10-CM | POA: Diagnosis not present

## 2019-09-09 DIAGNOSIS — R55 Syncope and collapse: Secondary | ICD-10-CM | POA: Diagnosis not present

## 2019-09-09 DIAGNOSIS — R109 Unspecified abdominal pain: Secondary | ICD-10-CM | POA: Diagnosis not present

## 2019-09-09 DIAGNOSIS — E1169 Type 2 diabetes mellitus with other specified complication: Secondary | ICD-10-CM | POA: Diagnosis not present

## 2019-09-09 DIAGNOSIS — A4151 Sepsis due to Escherichia coli [E. coli]: Principal | ICD-10-CM | POA: Diagnosis present

## 2019-09-09 DIAGNOSIS — R4189 Other symptoms and signs involving cognitive functions and awareness: Secondary | ICD-10-CM

## 2019-09-09 DIAGNOSIS — R680 Hypothermia, not associated with low environmental temperature: Secondary | ICD-10-CM | POA: Diagnosis not present

## 2019-09-09 DIAGNOSIS — I10 Essential (primary) hypertension: Secondary | ICD-10-CM | POA: Diagnosis not present

## 2019-09-09 DIAGNOSIS — J9611 Chronic respiratory failure with hypoxia: Secondary | ICD-10-CM | POA: Diagnosis not present

## 2019-09-09 DIAGNOSIS — I69351 Hemiplegia and hemiparesis following cerebral infarction affecting right dominant side: Secondary | ICD-10-CM | POA: Diagnosis not present

## 2019-09-09 DIAGNOSIS — R627 Adult failure to thrive: Secondary | ICD-10-CM | POA: Diagnosis not present

## 2019-09-09 DIAGNOSIS — G9341 Metabolic encephalopathy: Secondary | ICD-10-CM | POA: Diagnosis present

## 2019-09-09 DIAGNOSIS — U071 COVID-19: Secondary | ICD-10-CM | POA: Diagnosis not present

## 2019-09-09 DIAGNOSIS — R4182 Altered mental status, unspecified: Secondary | ICD-10-CM | POA: Diagnosis not present

## 2019-09-09 DIAGNOSIS — N179 Acute kidney failure, unspecified: Secondary | ICD-10-CM | POA: Diagnosis not present

## 2019-09-09 DIAGNOSIS — E669 Obesity, unspecified: Secondary | ICD-10-CM

## 2019-09-09 DIAGNOSIS — Z7982 Long term (current) use of aspirin: Secondary | ICD-10-CM | POA: Diagnosis not present

## 2019-09-09 DIAGNOSIS — B948 Sequelae of other specified infectious and parasitic diseases: Secondary | ICD-10-CM

## 2019-09-09 DIAGNOSIS — Z6821 Body mass index (BMI) 21.0-21.9, adult: Secondary | ICD-10-CM

## 2019-09-09 DIAGNOSIS — R64 Cachexia: Secondary | ICD-10-CM | POA: Diagnosis present

## 2019-09-09 DIAGNOSIS — E118 Type 2 diabetes mellitus with unspecified complications: Secondary | ICD-10-CM | POA: Diagnosis not present

## 2019-09-09 DIAGNOSIS — F0280 Dementia in other diseases classified elsewhere without behavioral disturbance: Secondary | ICD-10-CM | POA: Diagnosis not present

## 2019-09-09 DIAGNOSIS — Z823 Family history of stroke: Secondary | ICD-10-CM

## 2019-09-09 DIAGNOSIS — F03918 Unspecified dementia, unspecified severity, with other behavioral disturbance: Secondary | ICD-10-CM | POA: Diagnosis present

## 2019-09-09 DIAGNOSIS — R404 Transient alteration of awareness: Secondary | ICD-10-CM | POA: Diagnosis not present

## 2019-09-09 LAB — URINALYSIS, ROUTINE W REFLEX MICROSCOPIC
Glucose, UA: NEGATIVE mg/dL
Ketones, ur: NEGATIVE mg/dL
Nitrite: NEGATIVE
Protein, ur: 30 mg/dL — AB
Specific Gravity, Urine: 1.023 (ref 1.005–1.030)
pH: 5 (ref 5.0–8.0)

## 2019-09-09 LAB — BLOOD GAS, VENOUS
Acid-base deficit: 0 mmol/L (ref 0.0–2.0)
Bicarbonate: 27.3 mmol/L (ref 20.0–28.0)
O2 Saturation: 53 %
Patient temperature: 98.6
pCO2, Ven: 57.4 mmHg (ref 44.0–60.0)
pH, Ven: 7.299 (ref 7.250–7.430)
pO2, Ven: 35.3 mmHg (ref 32.0–45.0)

## 2019-09-09 LAB — COMPREHENSIVE METABOLIC PANEL
ALT: 98 U/L — ABNORMAL HIGH (ref 0–44)
ALT: 77 U/L — ABNORMAL HIGH (ref 0–44)
AST: 49 U/L — ABNORMAL HIGH (ref 15–41)
AST: 38 U/L (ref 15–41)
Albumin: 4.3 g/dL (ref 3.5–5.0)
Albumin: 3.6 g/dL (ref 3.5–5.0)
Alkaline Phosphatase: 148 U/L — ABNORMAL HIGH (ref 38–126)
Alkaline Phosphatase: 118 U/L (ref 38–126)
Anion gap: 26 — ABNORMAL HIGH (ref 5–15)
BUN: 111 mg/dL — ABNORMAL HIGH (ref 8–23)
BUN: 131 mg/dL — ABNORMAL HIGH (ref 8–23)
CO2: 25 mmol/L (ref 22–32)
CO2: 21 mmol/L — ABNORMAL LOW (ref 22–32)
Calcium: 10 mg/dL (ref 8.9–10.3)
Calcium: 8.5 mg/dL — ABNORMAL LOW (ref 8.9–10.3)
Chloride: 128 mmol/L — ABNORMAL HIGH (ref 98–111)
Chloride: >130 mmol/L (ref 98–111)
Creatinine, Ser: 3.33 mg/dL — ABNORMAL HIGH (ref 0.44–1.00)
Creatinine, Ser: 3.13 mg/dL — ABNORMAL HIGH (ref 0.44–1.00)
GFR calc Af Amer: 15 mL/min — ABNORMAL LOW (ref >60)
GFR calc Af Amer: 16 mL/min — ABNORMAL LOW (ref >60)
GFR calc non Af Amer: 13 mL/min — ABNORMAL LOW (ref >60)
GFR calc non Af Amer: 14 mL/min — ABNORMAL LOW (ref >60)
Glucose, Bld: 245 mg/dL — ABNORMAL HIGH (ref 70–99)
Glucose, Bld: 246 mg/dL — ABNORMAL HIGH (ref 70–99)
Potassium: 5.3 mmol/L — ABNORMAL HIGH (ref 3.5–5.1)
Potassium: 4.8 mmol/L (ref 3.5–5.1)
Sodium: 179 mmol/L (ref 135–145)
Sodium: 178 mmol/L (ref 135–145)
Total Bilirubin: 1.2 mg/dL (ref 0.3–1.2)
Total Bilirubin: 1.1 mg/dL (ref 0.3–1.2)
Total Protein: 8.7 g/dL — ABNORMAL HIGH (ref 6.5–8.1)
Total Protein: 7.3 g/dL (ref 6.5–8.1)

## 2019-09-09 LAB — CBC WITH DIFFERENTIAL/PLATELET
Abs Immature Granulocytes: 0.15 10*3/uL — ABNORMAL HIGH (ref 0.00–0.07)
Basophils Absolute: 0.1 10*3/uL (ref 0.0–0.1)
Basophils Relative: 0 %
Eosinophils Absolute: 0 10*3/uL (ref 0.0–0.5)
Eosinophils Relative: 0 %
HCT: 55.6 % — ABNORMAL HIGH (ref 36.0–46.0)
Hemoglobin: 16.4 g/dL — ABNORMAL HIGH (ref 12.0–15.0)
Immature Granulocytes: 1 %
Lymphocytes Relative: 11 %
Lymphs Abs: 1.6 10*3/uL (ref 0.7–4.0)
MCH: 30.3 pg (ref 26.0–34.0)
MCHC: 29.5 g/dL — ABNORMAL LOW (ref 30.0–36.0)
MCV: 102.6 fL — ABNORMAL HIGH (ref 80.0–100.0)
Monocytes Absolute: 0.7 10*3/uL (ref 0.1–1.0)
Monocytes Relative: 5 %
Neutro Abs: 12.4 10*3/uL — ABNORMAL HIGH (ref 1.7–7.7)
Neutrophils Relative %: 83 %
Platelets: 459 10*3/uL — ABNORMAL HIGH (ref 150–400)
RBC: 5.42 MIL/uL — ABNORMAL HIGH (ref 3.87–5.11)
RDW: 18.3 % — ABNORMAL HIGH (ref 11.5–15.5)
WBC: 15 10*3/uL — ABNORMAL HIGH (ref 4.0–10.5)
nRBC: 1.3 % — ABNORMAL HIGH (ref 0.0–0.2)

## 2019-09-09 LAB — BASIC METABOLIC PANEL
BUN: 137 mg/dL — ABNORMAL HIGH (ref 8–23)
CO2: 25 mmol/L (ref 22–32)
Calcium: 8.9 mg/dL (ref 8.9–10.3)
Chloride: >130 mmol/L (ref 98–111)
Creatinine, Ser: 3.11 mg/dL — ABNORMAL HIGH (ref 0.44–1.00)
GFR calc Af Amer: 16 mL/min — ABNORMAL LOW (ref >60)
GFR calc non Af Amer: 14 mL/min — ABNORMAL LOW (ref >60)
Glucose, Bld: 214 mg/dL — ABNORMAL HIGH (ref 70–99)
Potassium: 4.6 mmol/L (ref 3.5–5.1)
Sodium: 173 mmol/L (ref 135–145)

## 2019-09-09 LAB — LACTIC ACID, PLASMA: Lactic Acid, Venous: 4.1 mmol/L (ref 0.5–1.9)

## 2019-09-09 LAB — LIPASE, BLOOD: Lipase: 74 U/L — ABNORMAL HIGH (ref 11–51)

## 2019-09-09 LAB — CREATININE, URINE, RANDOM: Creatinine, Urine: 291.97 mg/dL

## 2019-09-09 LAB — SODIUM, URINE, RANDOM: Sodium, Ur: 14 mmol/L

## 2019-09-09 LAB — CBG MONITORING, ED: Glucose-Capillary: 117 mg/dL — ABNORMAL HIGH (ref 70–99)

## 2019-09-09 LAB — PROTIME-INR
INR: 1.4 — ABNORMAL HIGH (ref 0.8–1.2)
Prothrombin Time: 16.8 seconds — ABNORMAL HIGH (ref 11.4–15.2)

## 2019-09-09 LAB — POC OCCULT BLOOD, ED: Fecal Occult Bld: NEGATIVE

## 2019-09-09 LAB — SARS CORONAVIRUS 2 (TAT 6-24 HRS): SARS Coronavirus 2: NEGATIVE

## 2019-09-09 LAB — APTT: aPTT: 33 seconds (ref 24–36)

## 2019-09-09 LAB — POC SARS CORONAVIRUS 2 AG -  ED: SARS Coronavirus 2 Ag: NEGATIVE

## 2019-09-09 LAB — PROCALCITONIN: Procalcitonin: 0.29 ng/mL

## 2019-09-09 MED ORDER — DEXTROSE-NACL 5-0.2 % IV SOLN: INTRAVENOUS | Status: DC

## 2019-09-09 MED ORDER — ACETAMINOPHEN 325 MG PO TABS: 650.0000 mg | ORAL_TABLET | Freq: Four times a day (QID) | ORAL | Status: DC | PRN

## 2019-09-09 MED ORDER — INSULIN ASPART 100 UNIT/ML ~~LOC~~ SOLN
0.0000 [IU] | SUBCUTANEOUS | Status: DC
  Administered 2019-09-10: 08:00:00 2 [IU] via SUBCUTANEOUS
  Administered 2019-09-10 (×2): 1 [IU] via SUBCUTANEOUS
  Administered 2019-09-10: 11:00:00 2 [IU] via SUBCUTANEOUS
  Administered 2019-09-10: 16:00:00 1 [IU] via SUBCUTANEOUS
  Administered 2019-09-11: 05:00:00 2 [IU] via SUBCUTANEOUS
  Filled 2019-09-09: qty 0.06

## 2019-09-09 MED ORDER — CHLORHEXIDINE GLUCONATE CLOTH 2 % EX PADS
6.0000 | MEDICATED_PAD | Freq: Every day | CUTANEOUS | Status: DC
  Administered 2019-09-10 – 2019-09-21 (×10): 6 via TOPICAL

## 2019-09-09 MED ORDER — SODIUM CHLORIDE 0.9 % IV BOLUS (SEPSIS)
1000.0000 mL | Freq: Once | INTRAVENOUS | Status: AC
  Administered 2019-09-09: 11:00:00 1000 mL via INTRAVENOUS

## 2019-09-09 MED ORDER — FENTANYL CITRATE (PF) 100 MCG/2ML IJ SOLN
50.0000 ug | Freq: Once | INTRAMUSCULAR | Status: AC
  Administered 2019-09-09: 50 ug via INTRAVENOUS
  Filled 2019-09-09: qty 2

## 2019-09-09 MED ORDER — ACETAMINOPHEN 650 MG RE SUPP: 650.0000 mg | Freq: Four times a day (QID) | RECTAL | Status: DC | PRN

## 2019-09-09 MED ORDER — ORAL CARE MOUTH RINSE
15.0000 mL | Freq: Two times a day (BID) | OROMUCOSAL | Status: DC
  Administered 2019-09-09 – 2019-09-21 (×19): 15 mL via OROMUCOSAL

## 2019-09-09 MED ORDER — VANCOMYCIN HCL IN DEXTROSE 1-5 GM/200ML-% IV SOLN
1000.0000 mg | Freq: Once | INTRAVENOUS | Status: AC
  Administered 2019-09-09: 14:00:00 1000 mg via INTRAVENOUS
  Filled 2019-09-09: qty 200

## 2019-09-09 MED ORDER — SODIUM CHLORIDE 0.9 % IV SOLN
2.0000 g | Freq: Once | INTRAVENOUS | Status: AC
  Administered 2019-09-09: 2 g via INTRAVENOUS
  Filled 2019-09-09: qty 2

## 2019-09-09 MED ORDER — SODIUM CHLORIDE 0.9 % IV SOLN
1.0000 g | INTRAVENOUS | Status: DC
  Administered 2019-09-09 – 2019-09-10 (×2): 1 g via INTRAVENOUS
  Filled 2019-09-09: qty 1
  Filled 2019-09-09: qty 10

## 2019-09-09 MED ORDER — ENOXAPARIN SODIUM 30 MG/0.3ML ~~LOC~~ SOLN
30.0000 mg | SUBCUTANEOUS | Status: DC
  Administered 2019-09-09 – 2019-09-10 (×2): 30 mg via SUBCUTANEOUS
  Filled 2019-09-09 (×2): qty 0.3

## 2019-09-09 MED ORDER — METRONIDAZOLE IN NACL 5-0.79 MG/ML-% IV SOLN
500.0000 mg | Freq: Once | INTRAVENOUS | Status: AC
  Administered 2019-09-09: 12:00:00 500 mg via INTRAVENOUS
  Filled 2019-09-09: qty 100

## 2019-09-09 MED ORDER — SODIUM CHLORIDE 0.45 % IV SOLN: INTRAVENOUS | Status: DC

## 2019-09-09 NOTE — Consult Note (Addendum)
Renal Service Consult Note [facility]  Lavon PaganiniYvonne D Cuellar 09/09/2019 Maree Krabbeobert D Jenalee Trevizo Requesting Physician:  Dr Maryfrances Bunnellanford  Reason for Consult:  Hypernatremia, AKI HPI: The patient is a 78 y.o. year-old with hx of CVA, HTN, DM2, depression lives in SNF not sure how she got here, no ED or H&P notes yet.  Asked to see for hypernatremia and renal failure.   Pt is obtunded, reportedly she is from SNF.  In ED BP's were soft in 70's , after 2L NS bolus BP's are better 110/70.  Temp 97. WBC 15k , COVID pending. Blood cx's neg at 12 hrs. Abd CT was unremarkable and CT head showed only chronic changes.    ROS - n/a   Past Medical History  Past Medical History:  Diagnosis Date  . Acute cholecystitis 08/10/2013   Lap chole on 08/13/13   . Anxiety   . Depression   . Diabetes mellitus without complication ([name])   . Hypertension   . Restless leg syndrome   . Stroke [facility]([name])    Past Surgical History  Past Surgical History:  Procedure Laterality Date  . CHOLECYSTECTOMY N/A 08/13/2013   Procedure: LAPAROSCOPIC CHOLECYSTECTOMY ;  Surgeon: Currie Parishristian J Streck, MD;  [facility];  Service: General;  Laterality: N/A;  . EP IMPLANTABLE DEVICE N/A 06/02/2015   Procedure: Loop Recorder Insertion;  Surgeon: Duke SalviaSteven C Klein, MD;  [facility];  Service: Cardiovascular;  Laterality: N/A;  . ERCP N/A 08/12/2013   Procedure: ENDOSCOPIC RETROGRADE CHOLANGIOPANCREATOGRAPHY (ERCP);  Surgeon: Theda BelfastPatrick D Hung, MD;  [facility];  Service: Endoscopy;  Laterality: N/A;  . INCISIONAL HERNIA REPAIR N/A 08/20/2013   Procedure: HERNIA REPAIR INCISIONAL UMBILICAL;  Surgeon: Velora Hecklerodd M Gerkin, MD;  [facility];  Service: General;  Laterality: N/A;  . SPHINCTEROTOMY  08/12/2013   Procedure: Dennison MascotSPHINCTEROTOMY;  Surgeon: Theda BelfastPatrick D Hung, MD;  [facility];  Service: Endoscopy;;   Family History  Family History  Problem Relation Age of Onset  . Stroke Mother    Social History   reports that she has never smoked. She has never used smokeless tobacco. She reports that she does not drink alcohol or use drugs. Allergies  Allergies  Allergen Reactions  . Wellbutrin [Bupropion] Other (See Comments)    Wt loss  . Seroquel [Quetiapine Fumarate]     Seroquel is too strong at 1/2 - 1/4 - drooling     Home medications Prior to Admission medications   Medication Sig Start Date End Date Taking? Authorizing Provider  acetaminophen (TYLENOL) 325 MG tablet Take 650 mg by mouth 2 (two) times daily.   Yes [provider]  aspirin EC 81 MG tablet Take 1 tablet (81 mg total) by mouth daily. 08/09/19  Yes Ghimire, Werner LeanShanker M, MD  cholecalciferol (VITAMIN D) 1000 units tablet Take 1 tablet (1,000 Units total) by mouth daily. 08/09/19  Yes Ghimire, Werner LeanShanker M, MD  cyanocobalamin ([employer] VITAMIN B12) 1000 MCG tablet Take 1 tablet (1,000 mcg total) by mouth daily. 08/09/19  Yes Ghimire, Werner LeanShanker M, MD  divalproex (DEPAKOTE SPRINKLE) 125 MG capsule Take 250 mg by mouth 3 (three) times daily. 09/02/19  Yes [provider]  donepezil (ARICEPT) 5 MG tablet Take 5 mg by mouth daily. 09/02/19  Yes [provider]  doxepin (SINEQUAN) 25 MG capsule Take 1 capsule (25 mg total) by mouth 2 (two) times daily. Patient taking differently: Take 25 mg by mouth at bedtime.  08/09/19  Yes Ghimire, Werner LeanShanker M,  MD  escitalopram (LEXAPRO) 10 MG tablet Take 1 tablet (10 mg total) by mouth daily. 08/09/19  Yes Ghimire, Werner Lean, MD  feeding supplement, ENSURE ENLIVE, (ENSURE ENLIVE) LIQD Take 237 mLs by mouth 3 (three) times daily between meals. 08/09/19  Yes Ghimire, Werner Lean, MD  glucose blood test strip One touch Verio Flex.  Use as instructed to test sugars daily and prn.  E11.59 11/19/15  Yes Burns, Bobette Mo, MD  hydrOXYzine (ATARAX/VISTARIL) 10 MG tablet Take 10 mg by mouth 3 (three) times daily as needed for itching (allergies).   Yes [provider]  lubiprostone (AMITIZA)  24 MCG capsule Take 1 capsule (24 mcg total) by mouth 2 (two) times daily as needed for constipation. 08/09/19  Yes Ghimire, Werner Lean, MD  Multiple Vitamins-Minerals (DECUBI-VITE) CAPS Take 1 capsule by mouth daily.   Yes [provider]  omeprazole (PRILOSEC) 20 MG capsule Take 20 mg by mouth daily.   Yes [provider]  meclizine (ANTIVERT) 12.5 MG tablet Take 1 tablet (12.5 mg total) by mouth as needed for dizziness. Patient not taking: Reported on 09/09/2019 08/09/19   Maretta Bees, MD   Liver Function Tests Recent Labs  Lab 09/09/19 0955  AST 49*  ALT 98*  ALKPHOS 148*  BILITOT 1.2  PROT 8.7*  ALBUMIN 4.3   Recent Labs  Lab 09/09/19 0955  LIPASE 74*   CBC Recent Labs  Lab 09/09/19 0955  WBC 15.0*  NEUTROABS 12.4*  HGB 16.4*  HCT 55.6*  MCV 102.6*  PLT 459*   Basic Metabolic Panel Recent Labs  Lab 09/09/19 0955  NA 179*  K 5.3*  CL 128*  CO2 25  GLUCOSE 245*  BUN 111*  CREATININE 3.33*  CALCIUM 10.0   Iron/TIBC/Ferritin/ %Sat    Component Value Date/Time   IRON 64 11/30/2015 0840   TIBC Not calculated due to Iron <10. 10/28/2014 2025   FERRITIN 273 08/08/2019 0255   IRONPCTSAT 16.2 (L) 11/30/2015 0840    Vitals:   09/09/19 1345 09/09/19 1445 09/09/19 1527 09/09/19 1631  BP: 109/76 112/75 127/83 102/77  Pulse: 98  98 63  Resp: 18  11 13   Temp:      TempSrc:      SpO2: 99%  97% 97%    Exam Gen elderly frail AAF, not responding verbally, eyes closed, moves about, not following any commands No rash, cyanosis or gangrene Sclera anicteric, throat not seen  No jvd or bruits Chest clear bilat to bases no rales or wheezing RRR no MRG Abd soft ntnd no mass or ascites +bs GU defer MS no joint effusions or deformity Ext no LE or UE edema, poor skin turgor Neuro as above    Home meds:  - aspirin 81  - divalproex 250 tid/ donepezil 5 qd/ doxepin 25 hs/ escitalopram 10  - omeprazole 20 qd  - ensure enlive tid  - prn's/  vitamins/ supplements    Na 179  K 5.3 CO2 25 BUN 111  Cr 3.33  Alb 4.3  Hb 16  WBC 15k   plt 459  UA mod LE, 6-10 wbc/ ++bact, 0-5rbc    Assessment: 1. Hypernatremia - as per #2 2. Renal failure - presumed to be acute, pt appears severely dehydrated/ vol depleted.  No hx of nsaid's / ACEi/ ARB.  Hypotensive on presentation but BP's better after NS bolus.  3. H/o CVA 4. H/o depression  5. Sepsis - on empiric IV abx, blood cx's neg, may  have UTI   Rec: will start hypotonic IVF's D5 1/4 NS at 125 cc/hr, attempt to correct hypernatremia over 2-3 days.  Hopefully renal function will improve also w/ fluid resuscitation. Avoid nsaids/ acei/ ARB/ contrast. Does not appear to be a good candidate for aggressive renal care (I.e. RRT), recommend conservative management. Will follow.      Kelly Splinter  MD 09/09/2019, 5:30 PM

## 2019-09-09 NOTE — Progress Notes (Signed)
Attempted to call ED and obtain report. ED RN unable to talk at this time.   Will alert night RN of attempted telephone call, and inform them to be expecting a phone call for report.    SBAR evaluated, and communicated known information to oncoming RN.

## 2019-09-09 NOTE — ED Provider Notes (Signed)
[facility]-EMERGENCY DEPT Provider Note   CSN: [id number] Arrival date & time: 09/09/19  0920     History Chief Complaint  Patient presents with  . Altered Mental Status    Tracy [name] is a 78 y.o. female.  HPI   Level 5 caveat due to acuity of condition.      Tracy [name] is a 78 y.o. female, with a history of anxiety, depression, DM, HTN, stroke, presenting to the ED with decreased responsiveness.  Patient from [facility]. According to the EMS report, patient had symptoms of failure to thrive for at least the last week.  She had mental status decline noted yesterday.  Patient's baseline mental status would be able to talk, but oriented to self only.    Past Medical History:  Diagnosis Date  . Acute cholecystitis 08/10/2013   Lap chole on 08/13/13   . Anxiety   . Depression   . Diabetes mellitus without complication ([name])   . Hypertension   . Restless leg syndrome   . Stroke [facility])     Patient Active Problem List   Diagnosis Date Noted  . Sepsis, unspecified organism ([name]) 09/09/2019  . Hypernatremia 09/09/2019  . Chronic respiratory failure with hypoxia ([name]) 09/09/2019  . Acute renal failure (ARF) ([name]) 09/09/2019  . Acute metabolic encephalopathy 09/09/2019  . Coagulopathy ([name]) 09/09/2019  . Transaminitis 09/09/2019  . Pressure injury of skin 08/05/2019  . COVID-19 virus infection 07/30/2019  . Weakness 07/28/2019  . Hematochezia 07/31/2017  . Well adult exam 06/20/2017  . Abdominal pain 02/26/2017  . Insomnia 02/26/2017  . Cerumen impaction 12/04/2016  . Cervical disc disorder with radiculopathy of cervical region 09/22/2016  . Left rotator cuff tear arthropathy 09/22/2016  . Left shoulder pain 09/01/2016  . Left wrist pain 09/01/2016  . GERD (gastroesophageal reflux disease) 04/25/2016  . Onychomycosis 03/06/2016  . Viral illness 12/21/2015  . Blood in stool 11/30/2015  . Tension headache  10/08/2015  . Subdural hematoma ([name]) 02/05/2015  . Palpitations 01/18/2015  . HLD (hyperlipidemia) 01/12/2015  . Stroke ([name]) 01/12/2015  . Anemia, iron deficiency 12/24/2014  . Generalized anxiety disorder 11/27/2014  . Abscess of right buttock 11/04/2014  . Diabetes mellitus type 2 in obese ([name])   . SIRS (systemic inflammatory response syndrome) ([name]) 10/26/2014  . Umbilical hernia 02/03/2014  . Dementia with aggressive behavior ([name]) 02/03/2014  . Loss of weight 10/06/2013  . Constipation 10/06/2013  . HTN (hypertension) 08/10/2013  . History of CVA (cerebrovascular accident) 08/10/2013    Past Surgical History:  Procedure Laterality Date  . CHOLECYSTECTOMY N/A 08/13/2013   Procedure: LAPAROSCOPIC CHOLECYSTECTOMY ;  Surgeon: Currie Paris, MD;  Location: [facility] OR;  Service: General;  Laterality: N/A;  . EP IMPLANTABLE DEVICE N/A 06/02/2015   Procedure: Loop Recorder Insertion;  Surgeon: Duke Salvia, MD;  Location: [facility] INVASIVE CV LAB;  Service: Cardiovascular;  Laterality: N/A;  . ERCP N/A 08/12/2013   Procedure: ENDOSCOPIC RETROGRADE CHOLANGIOPANCREATOGRAPHY (ERCP);  Surgeon: Theda Belfast, MD;  Location: [facility] ENDOSCOPY;  Service: Endoscopy;  Laterality: N/A;  . INCISIONAL HERNIA REPAIR N/A 08/20/2013   Procedure: HERNIA REPAIR INCISIONAL UMBILICAL;  Surgeon: Velora Heckler, MD;  Location: [facility] OR;  Service: General;  Laterality: N/A;  . SPHINCTEROTOMY  08/12/2013   Procedure: Dennison Mascot;  Surgeon: Theda Belfast, MD;  Location: [facility] ENDOSCOPY;  Service: Endoscopy;;     OB History   No obstetric history on file.  Family History  Problem Relation Age of Onset  . Stroke Mother     Social History   Tobacco Use  . Smoking status: Never Smoker  . Smokeless tobacco: Never Used  Substance Use Topics  . Alcohol use: No  . Drug use: No    Home Medications Prior to Admission medications   Medication Sig Start Date End Date Taking? Authorizing Provider    acetaminophen (TYLENOL) 325 MG tablet Take 650 mg by mouth 2 (two) times daily.   Yes [provider]  aspirin EC 81 MG tablet Take 1 tablet (81 mg total) by mouth daily. 08/09/19  Yes Ghimire, Werner Lean, MD  cholecalciferol (VITAMIN D) 1000 units tablet Take 1 tablet (1,000 Units total) by mouth daily. 08/09/19  Yes Ghimire, Werner Lean, MD  cyanocobalamin ([employer] VITAMIN B12) 1000 MCG tablet Take 1 tablet (1,000 mcg total) by mouth daily. 08/09/19  Yes Ghimire, Werner Lean, MD  divalproex (DEPAKOTE SPRINKLE) 125 MG capsule Take 250 mg by mouth 3 (three) times daily. 09/02/19  Yes [provider]  donepezil (ARICEPT) 5 MG tablet Take 5 mg by mouth daily. 09/02/19  Yes [provider]  doxepin (SINEQUAN) 25 MG capsule Take 1 capsule (25 mg total) by mouth 2 (two) times daily. Patient taking differently: Take 25 mg by mouth at bedtime.  08/09/19  Yes Ghimire, Werner Lean, MD  escitalopram (LEXAPRO) 10 MG tablet Take 1 tablet (10 mg total) by mouth daily. 08/09/19  Yes Ghimire, Werner Lean, MD  feeding supplement, ENSURE ENLIVE, (ENSURE ENLIVE) LIQD Take 237 mLs by mouth 3 (three) times daily between meals. 08/09/19  Yes Ghimire, Werner Lean, MD  glucose blood test strip One touch Verio Flex.  Use as instructed to test sugars daily and prn.  E11.59 11/19/15  Yes Burns, Bobette Mo, MD  hydrOXYzine (ATARAX/VISTARIL) 10 MG tablet Take 10 mg by mouth 3 (three) times daily as needed for itching (allergies).   Yes [provider]  lubiprostone (AMITIZA) 24 MCG capsule Take 1 capsule (24 mcg total) by mouth 2 (two) times daily as needed for constipation. 08/09/19  Yes Ghimire, Werner Lean, MD  Multiple Vitamins-Minerals (DECUBI-VITE) CAPS Take 1 capsule by mouth daily.   Yes [provider]  omeprazole (PRILOSEC) 20 MG capsule Take 20 mg by mouth daily.   Yes [provider]  meclizine (ANTIVERT) 12.5 MG tablet Take 1 tablet (12.5 mg total) by mouth as needed for  dizziness. Patient not taking: Reported on 09/09/2019 08/09/19   Maretta Bees, MD    Allergies    Wellbutrin [bupropion] and Seroquel [quetiapine fumarate]  Review of Systems   Review of Systems  Unable to perform ROS: Acuity of condition    Physical Exam Updated Vital Signs BP (!) 70/50   Pulse (!) 110   Resp 12   SpO2 90%  Temperature: 97.1 F (rectal)  Physical Exam Vitals and nursing note reviewed.  Constitutional:      Appearance: She is well-developed. She is ill-appearing. She is not diaphoretic.  HENT:     Head: Normocephalic and atraumatic.     Mouth/Throat:     Mouth: Mucous membranes are dry.     Pharynx: Oropharynx is clear.  Eyes:     Conjunctiva/sclera: Conjunctivae normal.  Cardiovascular:     Rate and Rhythm: Regular rhythm. Tachycardia present.     Pulses: Normal pulses.     Heart sounds: Normal heart sounds.     Comments: Peripheral pulses initially not palpable with  cool extremities. Pulmonary:     Effort: Pulmonary effort is normal. No respiratory distress.     Breath sounds: Normal breath sounds.  Abdominal:     Palpations: Abdomen is soft.     Tenderness: There is no abdominal tenderness. There is no guarding.  Musculoskeletal:     Cervical back: Neck supple.     Right lower leg: No edema.     Left lower leg: No edema.  Lymphadenopathy:     Cervical: No cervical adenopathy.  Skin:    General: Skin is warm and dry.     Comments: Dry with poor skin turgor  Neurological:     GCS: GCS eye subscore is 4. GCS verbal subscore is 1. GCS motor subscore is 5.     Comments: Will spontaneously open eyes, but this does not seem to be consistently purposeful.  She does not appear to orient her gaze to her someone speaking. She is able to localize pain.  She has noted to move each of her extremities.  Psychiatric:        Mood and Affect: Mood and affect normal.        Speech: Speech normal.        Behavior: Behavior normal.     ED Results /  Procedures / Treatments   Labs (all labs ordered are listed, but only abnormal results are displayed) Labs Reviewed  COMPREHENSIVE METABOLIC PANEL - Abnormal; Notable for the following components:      Result Value   Sodium 179 (*)    Potassium 5.3 (*)    Chloride 128 (*)    Glucose, Bld 245 (*)    BUN 111 (*)    Creatinine, Ser 3.33 (*)    Total Protein 8.7 (*)    AST 49 (*)    ALT 98 (*)    Alkaline Phosphatase 148 (*)    GFR calc non Af Amer 13 (*)    GFR calc Af Amer 15 (*)    Anion gap 26 (*)    All other components within normal limits  CBC WITH DIFFERENTIAL/PLATELET - Abnormal; Notable for the following components:   WBC 15.0 (*)    RBC 5.42 (*)    Hemoglobin 16.4 (*)    HCT 55.6 (*)    MCV 102.6 (*)    MCHC 29.5 (*)    RDW 18.3 (*)    Platelets 459 (*)    nRBC 1.3 (*)    Neutro Abs 12.4 (*)    Abs Immature Granulocytes 0.15 (*)    All other components within normal limits  PROTIME-INR - Abnormal; Notable for the following components:   Prothrombin Time 16.8 (*)    INR 1.4 (*)    All other components within normal limits  URINALYSIS, ROUTINE W REFLEX MICROSCOPIC - Abnormal; Notable for the following components:   Color, Urine AMBER (*)    APPearance HAZY (*)    Hgb urine dipstick SMALL (*)    Bilirubin Urine SMALL (*)    Protein, ur 30 (*)    Leukocytes,Ua MODERATE (*)    Bacteria, UA MANY (*)    All other components within normal limits  LIPASE, BLOOD - Abnormal; Notable for the following components:   Lipase 74 (*)    All other components within normal limits  CULTURE, BLOOD (ROUTINE X 2)  CULTURE, BLOOD (ROUTINE X 2)  URINE CULTURE  SARS CORONAVIRUS 2 (TAT 6-24 HRS)  APTT  PROCALCITONIN  BLOOD GAS, VENOUS  LACTIC ACID, PLASMA  LACTIC ACID, PLASMA  COMPREHENSIVE METABOLIC PANEL  CREATININE, URINE, RANDOM  BASIC METABOLIC PANEL  SODIUM, URINE, RANDOM  OSMOLALITY, URINE  POC SARS CORONAVIRUS 2 AG -  ED  I-STAT CHEM 8, ED  POC OCCULT BLOOD, ED     EKG EKG Interpretation  Date/Time:  Tuesday September 09 2019 10:20:31 EST Ventricular Rate:  116 PR Interval:    QRS Duration: 134 QT Interval:  362 QTC Calculation: 503 R Axis:   -6 Text Interpretation: Sinus tachycardia Nonspecific intraventricular conduction delay Minimal ST depression, diffuse leads diffuse st changes likely rate related Otherwise no significant change Confirmed by Melene Plan [phone]) on 09/09/2019 10:52:58 AM   Radiology CT ABDOMEN PELVIS WO CONTRAST  Result Date: 09/09/2019 CLINICAL DATA:  Altered mental status and abdominal pain. COVID positive. EXAM: CT ABDOMEN AND PELVIS WITHOUT CONTRAST TECHNIQUE: Multidetector CT imaging of the abdomen and pelvis was performed following the standard protocol without IV contrast. COMPARISON:  None. FINDINGS: Lower chest: Minimal bibasilar atelectasis. No infiltrates or effusions. The heart is normal in size. There is moderate tortuosity and ectasia of the thoracic aorta Hepatobiliary: No worrisome hepatic lesions or intrahepatic biliary dilatation. Small low-attenuation lesion in the right hepatic lobe is likely a benign cyst. The gallbladder is surgically absent. No common bile duct dilatation. Pancreas: No mass, inflammation or ductal dilatation. Spleen: Normal size.  No focal lesions. Adrenals/Urinary Tract: Adrenal glands and kidneys are unremarkable. No renal calculi or hydronephrosis. The bladder appears normal. Stomach/Bowel: The stomach, duodenum, small bowel and colon are grossly normal without oral contrast. No inflammatory changes, mass lesions or obstructive findings. The appendix is normal. Vascular/Lymphatic: The aorta is normal in caliber. Minimal atheroscerlotic calcifications. No mesenteric of retroperitoneal mass or adenopathy. Small scattered lymph nodes are noted. Reproductive: The uterus and ovaries are unremarkable. Other: No pelvic mass or adenopathy. No free pelvic fluid collections. No inguinal mass or  adenopathy. No abdominal wall hernia or subcutaneous lesions. Musculoskeletal: No significant bony findings. Advanced lower lumbar degenerative disc disease and facet disease. IMPRESSION: 1. No acute abdominal/pelvic findings, mass lesions or adenopathy. 2. Status post cholecystectomy.  No biliary dilatation. Electronically Signed   By: Rudie Meyer M.D.   On: 09/09/2019 14:08   CT Head Wo Contrast  Result Date: 09/09/2019 CLINICAL DATA:  Altered mental status. COVID-19 positive. Dementia. EXAM: CT HEAD WITHOUT CONTRAST TECHNIQUE: Contiguous axial images were obtained from the base of the skull through the vertex without intravenous contrast. COMPARISON:  CT head 08/13/2019 FINDINGS: Brain: Cortical atrophy with ventricular enlargement, stable. Hypodensities left basal ganglia appear chronic and unchanged. Negative for acute infarct, hemorrhage, mass. Empty sella again noted. Vascular: Normal arterial flow voids Skull: Negative Sinuses/Orbits: Negative Other: None IMPRESSION: Atrophy and chronic ischemic changes stable from prior study. No acute abnormality. Electronically Signed   By: Marlan Palau M.D.   On: 09/09/2019 13:56   DG Chest Port 1 View  Result Date: 09/09/2019 CLINICAL DATA:  Altered mental status EXAM: PORTABLE CHEST 1 VIEW COMPARISON:  07/28/2019 FINDINGS: Normal heart size and mediastinal contours. There is suggestion of left-sided volume loss but this could be positional as there is no underlying infiltrate. No edema. No effusion or pneumothorax. No acute osseous findings. Implantable loop recorder.  Cholecystectomy clips IMPRESSION: No active disease. Electronically Signed   By: Marnee Spring M.D.   On: 09/09/2019 11:21    Procedures Fecal disimpaction  Date/Time: 09/09/2019 10:32 AM Performed by: Anselm Pancoast, PA-C Authorized by: Anselm Pancoast, PA-C  Patient identity  confirmed: arm band and provided demographic data Local anesthesia used: no  Anesthesia: Local anesthesia  used: no  Sedation: Patient sedated: no  Patient tolerance: patient tolerated the procedure well with no immediate complications Comments: Large amount of thick, clay-like brown stool streaked with bright red mucousy blood.  .Critical Care Performed by: Lorayne Bender, PA-C Authorized by: Lorayne Bender, PA-C   Critical care provider statement:    Critical care time (minutes):  75   Critical care time was exclusive of:  Separately billable procedures and treating other patients   Critical care was necessary to treat or prevent imminent or life-threatening deterioration of the following conditions:  Renal failure, metabolic crisis and dehydration   Critical care was time spent personally by me on the following activities:  Discussions with consultants, development of treatment plan with patient or surrogate, evaluation of patient's response to treatment, examination of patient, obtaining history from patient or surrogate, ordering and performing treatments and interventions, ordering and review of laboratory studies, ordering and review of radiographic studies, pulse oximetry, re-evaluation of patient's condition and review of old charts   I assumed direction of critical care for this patient from another provider in my specialty: no     (including critical care time)  Medications Ordered in ED Medications  dextrose 5 % and 0.2 % NaCl infusion (has no administration in time range)  sodium chloride 0.9 % bolus 1,000 mL (0 mLs Intravenous Stopped 09/09/19 1221)    And  sodium chloride 0.9 % bolus 1,000 mL (0 mLs Intravenous Stopped 09/09/19 1306)  metroNIDAZOLE (FLAGYL) IVPB 500 mg (0 mg Intravenous Stopped 09/09/19 1345)  ceFEPIme (MAXIPIME) 2 g in sodium chloride 0.9 % 100 mL IVPB (0 g Intravenous Stopped 09/09/19 1221)  vancomycin (VANCOCIN) IVPB 1000 mg/200 mL premix (0 mg Intravenous Stopped 09/09/19 1518)  fentaNYL (SUBLIMAZE) injection 50 mcg (50 mcg Intravenous Given 09/09/19 1518)     ED Course  I have reviewed the triage vital signs and the nursing notes.  Pertinent labs & imaging results that were available during my care of the patient were reviewed by me and considered in my medical decision making (see chart for details).  Clinical Course as of Sep 08 1752  Tue Sep 09, 2019  [facility] with daughter Ms Gus Rankin in November. [place] for 2 weeks, [place] for 3 weeks for quarantine. No HC POA in place.  She will call the other family members and discuss what they would like to have done for the patient.   [SJ]  42 Had another conversation with patient's daughter at the bedside. Further discussed patient's status and level of illness.  In summary, Ms. Joneen Caraway states patient can be DNR. She states, "I have been trying to get a hold of my brother and sister to get their input on what we should do with her. They are mad at me because they think I had her put in the nursing home after she was diagnosed with COVID-19 and that is why she is so sick now. I didn't make that decision. The doctors had her transferred there after she was [facility] from [facility]. My sister told me that she will just leave it up to me and my brother won't answer my calls." She states she does not know if there is any point in doing anything for her mother beyond comfort measures, but she is willing to try things like IV fluids and antibiotics.   [SJ]  0350 Suspect this to  be an erroneous reading.  Pulse Rate(!): 28 [SJ]  1146 Also suspect this to be an erroneous reading as obtaining an accurate SPO2 on this patient has been difficult since arrival.  SpO2(!): 66 % [SJ]  1215 On 6 L supplemental O2.  SpO2: 99 % [SJ]  1245 Spoke with Dr. Lajuana RippleKamineni, hospitalist. Requests we obtain noncontrast CT of the abdomen.  If normal, speak with patient's family again to see if they want to place the patient on comfort care.  If the family wants further intervention (such as dialysis), may contact with  nephrology for guidance on further management.   [SJ]  1449 Patient now much more active, but not purposely so.  [name] asked me to contact her other sister, Verlon AuLeslie [phone]) and her brother, Leonette MostCharles [phone]) to fill the man on the patient's status and get their opinion on what should be done in regards to her care.   [SJ]  1450 Spoke with patient's other daughter, Verlon AuLeslie [phone]). She states, "If [name] is up there, she will make this decision. I'm done with all of this. She can make any decisions that need to be made."   [SJ]  1502 Called patient's son, Leonette MostCharles [phone]).  No answer.  Left voicemail with callback number.   [SJ]  1537 Spoke with nephrology. He will continue to consult and make recommendations. No dialysis.    [SJ]  1546 Updated Dr. Lajuana RippleKamineni, hospitalist. States she will await nephrology's note and will work on admitting the patient.   [SJ]  1600 Spoke with patient's son, Leonette MostCharles.  We discussed the patient's condition and her critical level of illness.  He states, "I will have to talk to some of the other siblings to get their input."  I urged him to return the consensus as soon as possible.   [SJ]  1702 Spoke to Dr. Katrinka BlazingSmith with [facility] who agrees to evaluate and admit patient   [CA]    Clinical Course User Index [CA] Mannie StabileAberman, Caroline C, PA-C [SJ] Cosima Prentiss, Lorella NimrodShawn C, PA-C   MDM Rules/Calculators/A&P                      Patient presents with decreased responsiveness.  She was additionally found to be hypotensive, tachycardic, and hypoxic.  Code sepsis initiated. Normal saline boluses were given in initial resuscitation before sodium level resulted. Significant hypernatremia was noted.  Calculated free water deficit of 6.9 L.  Though the first choice of fluids (according to up-to-date) for hyponatremia correction is D5W, when hyperglycemia and hyperkalemia are present, 0.45% saline is the recommended second choice.  Significantly elevated creatinine and BUN.   These may be due to significant dehydration.  Patient's uremia could certainly be causing her encephalopathy.  Although the patient arrived critically ill, there was discussion amongst the patient's family as to the proper next steps and goals of care for the patient.  There are significant social factors interfering with the present family member, the patient's daughter [name], interfering with her ability to get her siblings' opinions in order to make a proper decision for their mother.  Prior to admission, patient did show improvement in her vital signs following initial resuscitation efforts.  Patient admitted for further management.  Findings and plan of care discussed with Melene Planan Floyd, DO. Dr. Adela LankFloyd personally evaluated and examined this patient.   Vitals:   09/09/19 1345 09/09/19 1445 09/09/19 1527 09/09/19 1631  BP: 109/76 112/75 127/83 102/77  Pulse: 98  98 63  Resp: 18  11  13  Temp:      TempSrc:      SpO2: 99%  97% 97%     Note: Lactic acid level not initially drawn due to shortage of proper collection tubes in the ED.  Final Clinical Impression(s) / ED Diagnoses Final diagnoses:  Dehydration  Hypernatremia  Decreased level of consciousness    Rx / DC Orders ED Discharge Orders    None       Anselm Pancoast, PA-C 09/09/19 1801    Melene Plan, DO 09/10/19 [phone]

## 2019-09-09 NOTE — ED Notes (Signed)
Per lab, lactic acid blood draw clotted on arrival to lab.  Will notify phlebotomy to draw labs.,

## 2019-09-09 NOTE — H&P (Signed)
History and Physical  Patient Name: Tracy [name]     WUJ:[id number]    DOB: [birth year]    DOA: 09/09/2019 PCP: Tresa Garter, MD  Patient coming from: SNF  Chief Complaint: Decreased mentation      HPI: Tracy [name] is a 78 y.o. F with hx dementia, SNF dwelling, CVA with R sided deficits, HTN, DM, and COVID in Nov 2020 with severe encephalopathy who presents with few days less responsiveness.  Caveat that the patient is obtunded, cannot provide independent history.  Per daughter, since getting out of hospital 1 month ago for Covid, the patient's mentation has been very diminished.  Per ER report, over the last several days, her mentation has been diminished, she is been less responsive, and today she was lethargic and hypotensive so she was sent to the ER.  In the ER, she was hypothermic, heart rate 110, blood pressure 70/50, SPO2 90% on 5 L.    Sodium 179, creatinine 3.33 (baseline 0.86-month ago), VBG without acidosis, mild transaminitis, INR 1.4, hemogram concentrated.  Blood cultures were obtained, urinalysis showed leukocytes, chest x-ray was clear, CT head and abdomen were unremarkable.  She was started on vancomycin, cefepime, and Flagyl empirically, given 30 cc/kg crystalloid, and the hospitalist service were asked to evaluate.          ROS: Review of Systems  Unable to perform ROS: Patient unresponsive          Past Medical History:  Diagnosis Date  . Acute cholecystitis 08/10/2013   Lap chole on 08/13/13   . Anxiety   . Depression   . Diabetes mellitus without complication ([name])   . Hypertension   . Restless leg syndrome   . Stroke [facility])     Past Surgical History:  Procedure Laterality Date  . CHOLECYSTECTOMY N/A 08/13/2013   Procedure: LAPAROSCOPIC CHOLECYSTECTOMY ;  Surgeon: Currie Paris, MD;  Location: [facility] OR;  Service: General;  Laterality: N/A;  . EP IMPLANTABLE DEVICE N/A 06/02/2015   Procedure: Loop Recorder Insertion;  Surgeon:  Duke Salvia, MD;  Location: [facility] INVASIVE CV LAB;  Service: Cardiovascular;  Laterality: N/A;  . ERCP N/A 08/12/2013   Procedure: ENDOSCOPIC RETROGRADE CHOLANGIOPANCREATOGRAPHY (ERCP);  Surgeon: Theda Belfast, MD;  Location: [facility] ENDOSCOPY;  Service: Endoscopy;  Laterality: N/A;  . INCISIONAL HERNIA REPAIR N/A 08/20/2013   Procedure: HERNIA REPAIR INCISIONAL UMBILICAL;  Surgeon: Velora Heckler, MD;  Location: [facility] OR;  Service: General;  Laterality: N/A;  . SPHINCTEROTOMY  08/12/2013   Procedure: Dennison Mascot;  Surgeon: Theda Belfast, MD;  Location: [facility] ENDOSCOPY;  Service: Endoscopy;;    Social History: Patient lives currently in SNF.  The patient unable to walk since COVID.  Nonsmoker.  Allergies  Allergen Reactions  . Wellbutrin [Bupropion] Other (See Comments)    Wt loss  . Seroquel [Quetiapine Fumarate]     Seroquel is too strong at 1/2 - 1/4 - drooling      Family history: family history includes Stroke in her mother.  Prior to Admission medications   Medication Sig Start Date End Date Taking? Authorizing Provider  acetaminophen (TYLENOL) 325 MG tablet Take 650 mg by mouth 2 (two) times daily.   Yes [provider]  aspirin EC 81 MG tablet Take 1 tablet (81 mg total) by mouth daily. 08/09/19  Yes Ghimire, Werner Lean, MD  cholecalciferol (VITAMIN D) 1000 units tablet Take 1 tablet (1,000 Units total) by mouth daily. 08/09/19  Yes Ghimire, Werner Lean, MD  cyanocobalamin ([employer] VITAMIN B12) 1000 MCG tablet Take 1 tablet (1,000 mcg total) by mouth daily. 08/09/19  Yes Ghimire, Werner Lean, MD  divalproex (DEPAKOTE SPRINKLE) 125 MG capsule Take 250 mg by mouth 3 (three) times daily. 09/02/19  Yes [provider]  donepezil (ARICEPT) 5 MG tablet Take 5 mg by mouth daily. 09/02/19  Yes [provider]  escitalopram (LEXAPRO) 10 MG tablet Take 1 tablet (10 mg total) by mouth daily. 08/09/19  Yes Ghimire, Werner Lean, MD  feeding supplement, ENSURE ENLIVE, (ENSURE ENLIVE)  LIQD Take 237 mLs by mouth 3 (three) times daily between meals. 08/09/19  Yes Ghimire, Werner Lean, MD  glucose blood test strip One touch Verio Flex.  Use as instructed to test sugars daily and prn.  E11.59 11/19/15  Yes Burns, Bobette Mo, MD  lubiprostone (AMITIZA) 24 MCG capsule Take 1 capsule (24 mcg total) by mouth 2 (two) times daily as needed for constipation. 08/09/19  Yes Ghimire, Werner Lean, MD  Multiple Vitamins-Minerals (DECUBI-VITE) CAPS Take 1 capsule by mouth daily.   Yes [provider]  omeprazole (PRILOSEC) 20 MG capsule Take 20 mg by mouth daily.   Yes [provider]  meclizine (ANTIVERT) 12.5 MG tablet Take 1 tablet (12.5 mg total) by mouth as needed for dizziness. Patient not taking: Reported on 09/09/2019 08/09/19   Maretta Bees, MD       Physical Exam: BP 102/77   Pulse 63   Temp (!) 97.1 F (36.2 C) (Rectal)   Resp 13   SpO2 97%  General appearance: Cachectic really adult female, obtunded.   Eyes: RIght eye anicteric, pupil round, responsive.  Will not open left eye.  Lids on left normal.    ENT: No nasal deformity, discharge, epistaxis.  Dentition appear dry with caked on food particles, oropharynx very dry. Neck: No neck masses.  Trachea midline.  No thyromegaly/tenderness. Lymph: No cervical or supraclavicular lymphadenopathy. Skin: Warm and dry and tented.  No jaundice.  No suspicious rashes or lesions on the face, neck, chest, abdomen, arms, or legs. Cardiac: Tachycardic, regular, nl S1-S2, no murmurs appreciated.  Capillary refill is brisk.  JVP not visible.  No LE edema.  Radial pulses 2+ and symmetric. Respiratory: Tachypneic, shallow.  CTAB without rales or wheezes. Abdomen: Abdomen soft.  No grimace to palpation or involuntary guarding or rigidity. No ascites, distension, hepatosplenomegaly.   MSK: No deformities or effusions of the large joints of the upper or lower extremities bilaterally.  Diffuse severe loss of subcutaneous muscle  mass and fat. Neuro: Right pupil responsive.  No spontaneous verbalizations.  Resists opening her other eye.  Slight movement of the right arm, no clear purposeful movements.  Withdraws from pain. Psych: Obtunded   Labs on Admission:  I have personally reviewed following labs and imaging studies: CBC: Recent Labs  Lab 09/09/19 0955  WBC 15.0*  NEUTROABS 12.4*  HGB 16.4*  HCT 55.6*  MCV 102.6*  PLT 459*   Basic Metabolic Panel: Recent Labs  Lab 09/09/19 0955  NA 179*  K 5.3*  CL 128*  CO2 25  GLUCOSE 245*  BUN 111*  CREATININE 3.33*  CALCIUM 10.0   GFR: CrCl cannot be calculated (Unknown ideal weight.).  Liver Function Tests: Recent Labs  Lab 09/09/19 0955  AST 49*  ALT 98*  ALKPHOS 148*  BILITOT 1.2  PROT 8.7*  ALBUMIN 4.3   Recent Labs  Lab 09/09/19 0955  LIPASE 74*   No results for input(s): AMMONIA in  the last 168 hours. Coagulation Profile: Recent Labs  Lab 09/09/19 0955  INR 1.4*   Cardiac Enzymes: No results for input(s): CKTOTAL, CKMB, CKMBINDEX, TROPONINI in the last 168 hours. BNP (last 3 results) No results for input(s): PROBNP in the last 8760 hours. HbA1C: No results for input(s): HGBA1C in the last 72 hours. CBG: No results for input(s): GLUCAP in the last 168 hours. Lipid Profile: No results for input(s): CHOL, HDL, LDLCALC, TRIG, CHOLHDL, LDLDIRECT in the last 72 hours. Thyroid Function Tests: No results for input(s): TSH, T4TOTAL, FREET4, T3FREE, THYROIDAB in the last 72 hours. Anemia Panel: No results for input(s): VITAMINB12, FOLATE, FERRITIN, TIBC, IRON, RETICCTPCT in the last 72 hours. Sepsis Labs: Lactic acid pending  Recent Results (from the past 240 hour(s))  Blood Culture (routine x 2)     Status: None (Preliminary result)   Collection Time: 09/09/19  9:52 AM   Specimen: BLOOD  Result Value Ref Range Status   Specimen Description   Final    BLOOD SITE NOT SPECIFIED Performed at [facility], [address], [place], [place] [address]    Special Requests   Final    BOTTLES DRAWN AEROBIC AND ANAEROBIC Blood Culture adequate volume Performed at [facility], [address], [place], [place] [address]    Culture   Final    NO GROWTH < 12 HOURS Performed at [facility], [address] [place], [place] [address]    Report Status PENDING  Incomplete  Blood Culture (routine x 2)     Status: None (Preliminary result)   Collection Time: 09/09/19  9:57 AM   Specimen: BLOOD  Result Value Ref Range Status   Specimen Description   Final    BLOOD BLOOD LEFT ARM Performed at [facility], [address], [place], [place] [address]    Special Requests   Final    BOTTLES DRAWN AEROBIC AND ANAEROBIC Blood Culture adequate volume Performed at [facility], [address], [place], [place] [address]    Culture   Final    NO GROWTH < 12 HOURS Performed at [facility], [address] [place], [place] [address]    Report Status PENDING  Incomplete           Radiological Exams on Admission: Personally reviewed chest x-ray shows no focal airspace disease or opacity; CT head and abdomen reports reviewed. And summarized above: CT ABDOMEN PELVIS WO CONTRAST  Result Date: 09/09/2019 CLINICAL DATA:  Altered mental status and abdominal pain. COVID positive. EXAM: CT ABDOMEN AND PELVIS WITHOUT CONTRAST TECHNIQUE: Multidetector CT imaging of the abdomen and pelvis was performed following the standard protocol without IV contrast. COMPARISON:  None. FINDINGS: Lower chest: Minimal bibasilar atelectasis. No infiltrates or effusions. The heart is normal in size. There is moderate tortuosity and ectasia of the thoracic aorta Hepatobiliary: No worrisome hepatic lesions or intrahepatic biliary dilatation. Small low-attenuation lesion in the right hepatic lobe is likely a benign cyst. The gallbladder is surgically absent. No common bile  duct dilatation. Pancreas: No mass, inflammation or ductal dilatation. Spleen: Normal size.  No focal lesions. Adrenals/Urinary Tract: Adrenal glands and kidneys are unremarkable. No renal calculi or hydronephrosis. The bladder appears normal. Stomach/Bowel: The stomach, duodenum, small bowel and colon are grossly normal without oral contrast. No inflammatory changes, mass lesions or obstructive findings. The appendix is normal. Vascular/Lymphatic: The aorta is normal in caliber. Minimal atheroscerlotic calcifications. No mesenteric of  retroperitoneal mass or adenopathy. Small scattered lymph nodes are noted. Reproductive: The uterus and ovaries are unremarkable. Other: No pelvic mass or adenopathy. No free pelvic fluid collections. No inguinal mass or adenopathy. No abdominal wall hernia or subcutaneous lesions. Musculoskeletal: No significant bony findings. Advanced lower lumbar degenerative disc disease and facet disease. IMPRESSION: 1. No acute abdominal/pelvic findings, mass lesions or adenopathy. 2. Status post cholecystectomy.  No biliary dilatation. Electronically Signed   By: Rudie MeyerP.  Gallerani M.D.   On: 09/09/2019 14:08   CT Head Wo Contrast  Result Date: 09/09/2019 CLINICAL DATA:  Altered mental status. COVID-19 positive. Dementia. EXAM: CT HEAD WITHOUT CONTRAST TECHNIQUE: Contiguous axial images were obtained from the base of the skull through the vertex without intravenous contrast. COMPARISON:  CT head 08/13/2019 FINDINGS: Brain: Cortical atrophy with ventricular enlargement, stable. Hypodensities left basal ganglia appear chronic and unchanged. Negative for acute infarct, hemorrhage, mass. Empty sella again noted. Vascular: Normal arterial flow voids Skull: Negative Sinuses/Orbits: Negative Other: None IMPRESSION: Atrophy and chronic ischemic changes stable from prior study. No acute abnormality. Electronically Signed   By: Marlan Palauharles  Clark M.D.   On: 09/09/2019 13:56   DG Chest Port 1 View  Result  Date: 09/09/2019 CLINICAL DATA:  Altered mental status EXAM: PORTABLE CHEST 1 VIEW COMPARISON:  07/28/2019 FINDINGS: Normal heart size and mediastinal contours. There is suggestion of left-sided volume loss but this could be positional as there is no underlying infiltrate. No edema. No effusion or pneumothorax. No acute osseous findings. Implantable loop recorder.  Cholecystectomy clips IMPRESSION: No active disease. Electronically Signed   By: Marnee SpringJonathon  Watts M.D.   On: 09/09/2019 11:21    EKG: Independently reviewed.  Sinus tachycardia, QTC 503, no ST depressions.       Assessment/Plan   Hyponatremia Dehydration This was likely a result of residual Covid encephalopathy, and likely severe decreased oral intake in a very debilitated patient without access to free water. -D5 quarter normal saline at 125 an hour to achieve goal correction 0.5 mmol/L/h sodium over the next 24 hours -Every 12 BMP   Acute renal failure Baseline creatinine 0.7, presents with creatinine greater than 3.  Suspect this was hemodynamically mediated given the above. -Check urine electrolytes -Check renal ultrasound -IV fluids -Consult nephrology, appreciate recommendations -Trend BMP -Consult palliative care  Possible sepsis Presents with leukocytosis, tachycardia, renal failure, altered mentation, transaminitis.  Urinalysis suggest possible infection.  Chest x-ray clear. -Continue ceftriaxone -Follow blood and urine cultures   Post Covid encephalopathy and chronic hypoxic respiratory failure -Continue supplemental oxygen  Hypertension History of CVA, secondary prevention Not on antihypertensives at baseline, hypertensive on arrival. -Resume aspirin when able to take p.o.  Diabetes Not on antidiabetics at baseline, glucose here normal. -Very low-dose sliding scale corrections as needed  Prolonged QT interval -Avoid QT prolonging meds  Transaminitis Likely from sepsis versus dehydration. -Trend  LFTs  Coagulopathy Due to sepsis. -Repeat INR tomorrow  Dementia with behavioral disturbance Per family, prior to Covid, she was able to interact, will had memory loss.  Since Covid, she has been severely much less able to interact. -Resume donepezil, Depakote, escitalopram when able to take p.o. -Hold doxepin      DVT prophylaxis: Lovenox, reduced dose Code Status: Full code for now Family Communication: Discussed with son Tracy [name], daughter Tracy [name].  CODE STATUS was extensively reviewed, they will have a family discussion and provide us with an update.  Disposition Plan: Anticipate IV fluids and IV antibiotics.  I discussed the patient's poor  prognosis with family, likelihood of severe residual cognitive impairment after this degree of hyponatremia, the possibility that her renal function would not recover,, and the low likelihood of a return to functional status.  The patient has a high likelihood to progress to multiorgan failure and death.  I recommended that she be a no code, family are considering.  In the meantime, we will continue current management with medical therapy for 48 hours, if no improvement, I would recommend comfort measures.  Consults called: Nephrology Admission status: Inpatient     Medical decision making: Patient seen at 5:57 PM on 09/09/2019.  What exists of the patient's chart was reviewed in depth and summarized above.  Clinical condition: Hemodynamically improved, mentation diminished.      At the time of admission, it appears that the appropriate admission status for this patient is INPATIENT. This is judged to be reasonable and necessary in order to provide the required intensity of service to ensure the patient's safety     Together, these circumstances are felt to place her at high risk for further clinical deterioration threatening life, limb, or organ requiring a high intensity of service due to this acute illness that poses a threat to life, limb  or bodily function.  I certify that at the point of admission it is my clinical judgment that the patient will require inpatient hospital care spanning beyond 2 midnights from the point of admission and that early discharge would result in unnecessary risk of decompensation and readmission or threat to life, limb or bodily function.     [facility] Please page though [facility] or Epic secure chat:  For password, contact charge nurse

## 2019-09-09 NOTE — Progress Notes (Signed)
A consult was received from an ED physician for cefepime and vancomycin per pharmacy dosing.  The patient's profile has been reviewed for ht/wt/allergies/indication/available labs.   A one time order has been placed for cefepime 2g and vancomycin 1g.  Further antibiotics/pharmacy consults should be ordered by admitting physician if indicated.                       Thank you, Reginia Naas 09/09/2019  10:03 AM

## 2019-09-09 NOTE — ED Notes (Signed)
Date and time results received: 09/09/19 1843 (use smartphrase ".now" to insert current time)  Test: Chloride Critical Value: >130  Name of Provider Notified: Danford  Orders Received? Or Actions Taken?: Orders Received - See Orders for details

## 2019-09-09 NOTE — ED Notes (Signed)
Spoke with Dr. Loleta Books, admitting placement will be changed due to acuity change and recent lab values.

## 2019-09-09 NOTE — ED Notes (Signed)
Date and time results received: 09/09/19 1843 (use smartphrase ".now" to insert current time)  Test: Lactic  Critical Value: 4.1  Name of Provider Notified: Loleta Books, MD  Orders Received? Or Actions Taken?: Orders Received - See Orders for details

## 2019-09-09 NOTE — ED Notes (Signed)
Date and time results received: 09/09/19 1843 (use smartphrase ".now" to insert current time)  Test: Sodium Critical Value: 178  Name of Provider Notified: Danford  Orders Received? Or Actions Taken?: Orders Received - See Orders for details

## 2019-09-09 NOTE — Progress Notes (Signed)
Notified bedside nurse of need to draw lactic acid.  

## 2019-09-09 NOTE — ED Notes (Signed)
ED TO INPATIENT HANDOFF REPORT  Name/Age/Gender Tracy [name] 78 y.o. female  Code Status    Code Status Orders  (From admission, onward)         Start     Ordered   09/09/19 2028  Full code  Continuous     09/09/19 2027        Code Status History    Date Active Date Inactive Code Status Order ID Comments User Context   07/28/2019 1750 08/09/2019 1540 Full Code [id number]  Truddie Hidden, MD ED   02/05/2015 1659 02/07/2015 1502 Full Code [id number]  Ashok Pall, MD ED   10/26/2014 2213 10/30/2014 1926 Full Code [id number]  Etta Quill, DO ED   08/20/2013 1444 08/24/2013 1437 Full Code [id number]  Armandina Gemma, MD Inpatient   08/19/2013 0029 08/20/2013 1444 Full Code [id number]  Rolm Bookbinder, MD Inpatient   08/10/2013 0836 08/14/2013 2051 Full Code [id number]  Orvan Falconer, MD Inpatient   Advance Care Planning Activity      Home/SNF/Other Skilled nursing facility  Chief Complaint Sepsis, unspecified organism ([place]) [A41.9] Sepsis ([place]) [A41.9]  Level of Care/Admitting Diagnosis ED Disposition    ED Disposition Condition [facility] [100102]  Level of Care: Stepdown [14]  Admit to SDU based on following criteria: Hemodynamic compromise or significant risk of instability:  Patient requiring short term acute titration and management of vasoactive drips, and invasive monitoring (i.e., CVP and Arterial line).  Covid Evaluation: Confirmed COVID Negative  Diagnosis: Sepsis [facility]) [[id number]]  Admitting Physician: Edwin Dada [[id number]]  Attending Physician: Edwin Dada [[id number]]  Estimated length of stay: past midnight tomorrow  Certification:: I certify this patient will need inpatient services for at least 2 midnights       Medical History Past Medical History:  Diagnosis Date  . Acute cholecystitis 08/10/2013   Lap chole on 08/13/13   . Anxiety   . Depression   . Diabetes mellitus without  complication ([place])   . Hypertension   . Restless leg syndrome   . Stroke [facility])     Allergies Allergies  Allergen Reactions  . Wellbutrin [Bupropion] Other (See Comments)    Wt loss  . Seroquel [Quetiapine Fumarate]     Seroquel is too strong at 1/2 - 1/4 - drooling      IV Location/Drains/Wounds Patient Lines/Drains/Airways Status   Active Line/Drains/Airways    Name:   Placement date:   Placement time:   Site:   Days:   Peripheral IV 09/09/19 Left;Upper Arm   09/09/19    1000    Arm   less than 1   Peripheral IV 09/09/19 Right;Upper Arm   09/09/19    2055    Arm   less than 1   External Urinary Catheter   07/29/19    0113    --   42   Pressure Injury 08/04/19 Coccyx Mid Stage II -  Partial thickness loss of dermis presenting as a shallow open ulcer with a red, pink wound bed without slough.   08/04/19    2000     36          Labs/Imaging Results for orders placed or performed during the hospital encounter of 09/09/19 (from the past 48 hour(s))  Blood Culture (routine x 2)     Status: None (Preliminary result)   Collection Time: 09/09/19  9:52 AM   Specimen: BLOOD  Result Value Ref Range  Specimen Description      BLOOD SITE NOT SPECIFIED Performed at [facility] [address] [place], [place] [address]    Special Requests      BOTTLES DRAWN AEROBIC AND ANAEROBIC Blood Culture adequate volume Performed at [place] [address] [place], [place] [address]    Culture      NO GROWTH < 12 HOURS Performed at [facility] [address], [place], [place] [address]    Report Status PENDING   Comprehensive metabolic panel     Status: Abnormal   Collection Time: 09/09/19  9:55 AM  Result Value Ref Range   Sodium 179 (HH) 135 - 145 mmol/L    Comment: REPEATED TO VERIFY CRITICAL RESULT CALLED TO, READ BACK BY AND VERIFIED WITH: SIMPSON,C. RN AT 1104 09/09/19 MULLINS,T    Potassium 5.3 (H) 3.5 - 5.1 mmol/L     Comment: NO VISIBLE HEMOLYSIS REPEATED TO VERIFY    Chloride 128 (H) 98 - 111 mmol/L    Comment: REPEATED TO VERIFY   CO2 25 22 - 32 mmol/L    Comment: REPEATED TO VERIFY   Glucose, Bld 245 (H) 70 - 99 mg/dL   BUN 111 (H) 8 - 23 mg/dL    Comment: RESULTS CONFIRMED BY MANUAL DILUTION   Creatinine, Ser 3.33 (H) 0.44 - 1.00 mg/dL   Calcium 10.0 8.9 - 10.3 mg/dL    Comment: REPEATED TO VERIFY   Total Protein 8.7 (H) 6.5 - 8.1 g/dL   Albumin 4.3 3.5 - 5.0 g/dL   AST 49 (H) 15 - 41 U/L   ALT 98 (H) 0 - 44 U/L   Alkaline Phosphatase 148 (H) 38 - 126 U/L   Total Bilirubin 1.2 0.3 - 1.2 mg/dL   GFR calc non Af Amer 13 (L) >60 mL/min   GFR calc Af Amer 15 (L) >60 mL/min   Anion gap 26 (H) 5 - 15    Comment: REPEATED TO VERIFY Performed at [facility], [place] [address] [place], [place] [address]   CBC WITH DIFFERENTIAL     Status: Abnormal   Collection Time: 09/09/19  9:55 AM  Result Value Ref Range   WBC 15.0 (H) 4.0 - 10.5 K/uL   RBC 5.42 (H) 3.87 - 5.11 MIL/uL   Hemoglobin 16.4 (H) 12.0 - 15.0 g/dL   HCT 55.6 (H) 36.0 - 46.0 %   MCV 102.6 (H) 80.0 - 100.0 fL   MCH 30.3 26.0 - 34.0 pg   MCHC 29.5 (L) 30.0 - 36.0 g/dL   RDW 18.3 (H) 11.5 - 15.5 %   Platelets 459 (H) 150 - 400 K/uL   nRBC 1.3 (H) 0.0 - 0.2 %   Neutrophils Relative % 83 %   Neutro Abs 12.4 (H) 1.7 - 7.7 K/uL   Lymphocytes Relative 11 %   Lymphs Abs 1.6 0.7 - 4.0 K/uL   Monocytes Relative 5 %   Monocytes Absolute 0.7 0.1 - 1.0 K/uL   Eosinophils Relative 0 %   Eosinophils Absolute 0.0 0.0 - 0.5 K/uL   Basophils Relative 0 %   Basophils Absolute 0.1 0.0 - 0.1 K/uL   Immature Granulocytes 1 %   Abs Immature Granulocytes 0.15 (H) 0.00 - 0.07 K/uL    Comment: Performed at [facility], [place] [address], [place], [place] [address]  APTT     Status: None   Collection Time: 09/09/19  9:55 AM  Result Value Ref  Range   aPTT 33 24 - 36 seconds    Comment: Performed at [facility], [place] [address], [place], [place] [address]  Protime-INR     Status: Abnormal   Collection Time: 09/09/19  9:55 AM  Result Value Ref Range   Prothrombin Time 16.8 (H) 11.4 - 15.2 seconds   INR 1.4 (H) 0.8 - 1.2    Comment: (NOTE) INR goal varies based on device and disease states. Performed at [facility], [place] [address] [place], [place] [address]   Procalcitonin     Status: None   Collection Time: 09/09/19  9:55 AM  Result Value Ref Range   Procalcitonin 0.29 ng/mL    Comment:        Interpretation: PCT (Procalcitonin) <= 0.5 ng/mL: Systemic infection (sepsis) is not likely. Local bacterial infection is possible. (NOTE)       Sepsis PCT Algorithm           Lower Respiratory Tract                                      Infection PCT Algorithm    ----------------------------     ----------------------------         PCT < 0.25 ng/mL                PCT < 0.10 ng/mL         Strongly encourage             Strongly discourage   discontinuation of antibiotics    initiation of antibiotics    ----------------------------     -----------------------------       PCT 0.25 - 0.50 ng/mL            PCT 0.10 - 0.25 ng/mL               OR       >80% decrease in PCT            Discourage initiation of                                            antibiotics      Encourage discontinuation           of antibiotics    ----------------------------     -----------------------------         PCT >= 0.50 ng/mL              PCT 0.26 - 0.50 ng/mL               AND        <80% decrease in PCT             Encourage initiation of                                             antibiotics       Encourage continuation           of antibiotics    ----------------------------     -----------------------------        PCT >= 0.50 ng/mL  PCT > 0.50 ng/mL               AND         increase in PCT                  Strongly encourage                                       initiation of antibiotics    Strongly encourage escalation           of antibiotics                                     -----------------------------                                           PCT <= 0.25 ng/mL                                                 OR                                        > 80% decrease in PCT                                     Discontinue / Do not initiate                                             antibiotics Performed at [place] [address], [place], [place] [address]   Lipase, blood     Status: Abnormal   Collection Time: 09/09/19  9:55 AM  Result Value Ref Range   Lipase 74 (H) 11 - 51 U/L    Comment: Performed at [facility], [place] [address], [place], [place] [address]  Blood gas, venous ([facility], [facility], [facility])     Status: None   Collection Time: 09/09/19  9:56 AM  Result Value Ref Range   FIO2 NOT PROVIDED BY NURSE    pH, Ven 7.299 7.250 - 7.430   pCO2, Ven 57.4 44.0 - 60.0 mmHg   pO2, Ven 35.3 32.0 - 45.0 mmHg   Bicarbonate 27.3 20.0 - 28.0 mmol/L   Acid-base deficit 0.0 0.0 - 2.0 mmol/L   O2 Saturation 53.0 %   Patient temperature 98.6    Sample type VENOUS     Comment: Performed at [facility], [place] [address] [place], [place] [address]  Blood Culture (routine x 2)     Status: None (Preliminary result)   Collection Time: 09/09/19  9:57 AM   Specimen: BLOOD  Result Value Ref Range   Specimen Description      BLOOD BLOOD LEFT ARM Performed at [place] [address] [place], [place]  [address]    Special Requests      BOTTLES DRAWN AEROBIC AND ANAEROBIC Blood Culture adequate volume Performed at [place] [address] [place], [place] [address]    Culture      NO GROWTH < 12 HOURS Performed at [facility] [address] [place], [place] [address]    Report Status PENDING   POC SARS Coronavirus 2 Ag-ED - Nasal  Swab (BD Veritor Kit)     Status: None   Collection Time: 09/09/19 10:30 AM  Result Value Ref Range   SARS Coronavirus 2 Ag NEGATIVE NEGATIVE    Comment: (NOTE) SARS-CoV-2 antigen NOT DETECTED.  Negative results are presumptive.  Negative results do not preclude SARS-CoV-2 infection and should not be used as the sole basis for treatment or other patient management decisions, including infection  control decisions, particularly in the presence of clinical signs and  symptoms consistent with COVID-19, or in those who have been in contact with the virus.  Negative results must be combined with clinical observations, patient history, and epidemiological information. The expected result is Negative. Fact Sheet for Patients: PodPark.tn Fact Sheet for Healthcare Providers: GiftContent.is This test is not yet approved or cleared by the [place] FDA and  has been authorized for detection and/or diagnosis of SARS-CoV-2 by FDA under an Emergency Use Authorization (EUA).  This EUA will remain in effect (meaning this test can be used) for the duration of  the COVID-19 de claration under Section 564(b)(1) of the Act, 21 U.S.C. section 360bbb-3(b)(1), unless the authorization is terminated or revoked sooner.   POC occult blood, ED Provider will collect     Status: None   Collection Time: 09/09/19 10:42 AM  Result Value Ref Range   Fecal Occult Bld NEGATIVE NEGATIVE  Urinalysis, Routine w reflex microscopic     Status: Abnormal   Collection Time: 09/09/19 10:47 AM  Result Value Ref Range   Color, Urine AMBER (A) YELLOW    Comment: BIOCHEMICALS MAY BE AFFECTED BY COLOR   APPearance HAZY (A) CLEAR   Specific Gravity, Urine 1.023 1.005 - 1.030   pH 5.0 5.0 - 8.0   Glucose, UA NEGATIVE NEGATIVE mg/dL   Hgb urine dipstick SMALL (A) NEGATIVE   Bilirubin Urine SMALL (A) NEGATIVE   Ketones, ur NEGATIVE NEGATIVE mg/dL   Protein, ur 30 (A)  NEGATIVE mg/dL   Nitrite NEGATIVE NEGATIVE   Leukocytes,Ua MODERATE (A) NEGATIVE   WBC, UA 6-10 0 - 5 WBC/hpf   Bacteria, UA MANY (A) NONE SEEN   Squamous Epithelial / LPF 0-5 0 - 5   Mucus PRESENT     Comment: Performed at [facility], [place] [address], [place], [place] [address]  SARS CORONAVIRUS 2 (TAT 6-24 HRS) Nasopharyngeal Nasopharyngeal Swab     Status: None   Collection Time: 09/09/19 10:47 AM   Specimen: Nasopharyngeal Swab  Result Value Ref Range   SARS Coronavirus 2 NEGATIVE NEGATIVE    Comment: (NOTE) SARS-CoV-2 target nucleic acids are NOT DETECTED. The SARS-CoV-2 RNA is generally detectable in upper and lower respiratory specimens during the acute phase of infection. Negative results do not preclude SARS-CoV-2 infection, do not rule out co-infections with other pathogens, and should not be used as the sole basis for treatment or other patient management decisions. Negative results must be combined with clinical observations, patient history, and epidemiological information. The expected result is Negative. Fact Sheet for Patients: SugarRoll.be Fact Sheet for Healthcare Providers: https://www.woods-mathews.com/  This test is not yet approved or cleared by the [place] and  has been authorized for detection and/or diagnosis of SARS-CoV-2 by FDA under an Emergency Use Authorization (EUA). This EUA will remain  in effect (meaning this test can be used) for the duration of the COVID-19 declaration under Section 56 4(b)(1) of the Act, 21 U.S.C. section 360bbb-3(b)(1), unless the authorization is terminated or revoked sooner. Performed at [facility], [place] [address], [place], [place] [address]   Creatinine, urine, random     Status: None   Collection Time: 09/09/19 10:47 AM  Result Value Ref Range   Creatinine, Urine 291.97 mg/dL    Comment: Performed at [facility], [place]  [address], [place], [place] [address]  Sodium, urine, random     Status: None   Collection Time: 09/09/19 10:47 AM  Result Value Ref Range   Sodium, Ur 14 mmol/L    Comment: Performed at [facility], [place] [address] [place], [place] [address]  Lactic acid, plasma     Status: Abnormal   Collection Time: 09/09/19  5:02 PM  Result Value Ref Range   Lactic Acid, Venous 4.1 (HH) 0.5 - 1.9 mmol/L    Comment: CRITICAL RESULT CALLED TO, READ BACK BY AND VERIFIED WITH: K.Carrol Hougland AT 1841 ON 09/09/19 BY N.THOMPSON Performed at [facility], [place] [address], [place], [place] [address]   Comprehensive metabolic panel     Status: Abnormal   Collection Time: 09/09/19  5:02 PM  Result Value Ref Range   Sodium 178 (HH) 135 - 145 mmol/L    Comment: REPEATED TO VERIFY CRITICAL RESULT CALLED TO, READ BACK BY AND VERIFIED WITH: K.Mekhi Lascola AT 1841 ON 09/09/19 BY N.THOMPSON    Potassium 4.8 3.5 - 5.1 mmol/L    Comment: REPEATED TO VERIFY   Chloride >130 (HH) 98 - 111 mmol/L    Comment: REPEATED TO VERIFY CRITICAL RESULT CALLED TO, READ BACK BY AND VERIFIED WITH: K.Casin Federici AT 1841 ON 09/09/19 BY N.THOMPSON    CO2 21 (L) 22 - 32 mmol/L    Comment: REPEATED TO VERIFY   Glucose, Bld 246 (H) 70 - 99 mg/dL   BUN 131 (H) 8 - 23 mg/dL    Comment: RESULTS CONFIRMED BY MANUAL DILUTION   Creatinine, Ser 3.13 (H) 0.44 - 1.00 mg/dL   Calcium 8.5 (L) 8.9 - 10.3 mg/dL    Comment: REPEATED TO VERIFY   Total Protein 7.3 6.5 - 8.1 g/dL   Albumin 3.6 3.5 - 5.0 g/dL   AST 38 15 - 41 U/L   ALT 77 (H) 0 - 44 U/L   Alkaline Phosphatase 118 38 - 126 U/L   Total Bilirubin 1.1 0.3 - 1.2 mg/dL   GFR calc non Af Amer 14 (L) >60 mL/min   GFR calc Af Amer 16 (L) >60 mL/min   Anion gap NOT CALCULATED 5 - 15    Comment: Performed at [facility], [place] [address] [place],  [address]  CBG monitoring, ED     Status: Abnormal   Collection Time: 09/09/19  8:37 PM   Result Value Ref Range   Glucose-Capillary 117 (H) 70 - 99 mg/dL   CT ABDOMEN PELVIS WO CONTRAST  Result Date: 09/09/2019 CLINICAL DATA:  Altered mental status and abdominal pain. COVID positive. EXAM: CT ABDOMEN AND PELVIS WITHOUT CONTRAST TECHNIQUE: Multidetector CT imaging of the abdomen and pelvis was performed following the standard protocol without IV contrast.  COMPARISON:  None. FINDINGS: Lower chest: Minimal bibasilar atelectasis. No infiltrates or effusions. The heart is normal in size. There is moderate tortuosity and ectasia of the thoracic aorta Hepatobiliary: No worrisome hepatic lesions or intrahepatic biliary dilatation. Small low-attenuation lesion in the right hepatic lobe is likely a benign cyst. The gallbladder is surgically absent. No common bile duct dilatation. Pancreas: No mass, inflammation or ductal dilatation. Spleen: Normal size.  No focal lesions. Adrenals/Urinary Tract: Adrenal glands and kidneys are unremarkable. No renal calculi or hydronephrosis. The bladder appears normal. Stomach/Bowel: The stomach, duodenum, small bowel and colon are grossly normal without oral contrast. No inflammatory changes, mass lesions or obstructive findings. The appendix is normal. Vascular/Lymphatic: The aorta is normal in caliber. Minimal atheroscerlotic calcifications. No mesenteric of retroperitoneal mass or adenopathy. Small scattered lymph nodes are noted. Reproductive: The uterus and ovaries are unremarkable. Other: No pelvic mass or adenopathy. No free pelvic fluid collections. No inguinal mass or adenopathy. No abdominal wall hernia or subcutaneous lesions. Musculoskeletal: No significant bony findings. Advanced lower lumbar degenerative disc disease and facet disease. IMPRESSION: 1. No acute abdominal/pelvic findings, mass lesions or adenopathy. 2. Status post cholecystectomy.  No biliary dilatation. Electronically Signed   By: Marijo Sanes M.D.   On: 09/09/2019 14:08   CT Head Wo  Contrast  Result Date: 09/09/2019 CLINICAL DATA:  Altered mental status. COVID-19 positive. Dementia. EXAM: CT HEAD WITHOUT CONTRAST TECHNIQUE: Contiguous axial images were obtained from the base of the skull through the vertex without intravenous contrast. COMPARISON:  CT head 08/13/2019 FINDINGS: Brain: Cortical atrophy with ventricular enlargement, stable. Hypodensities left basal ganglia appear chronic and unchanged. Negative for acute infarct, hemorrhage, mass. Empty sella again noted. Vascular: Normal arterial flow voids Skull: Negative Sinuses/Orbits: Negative Other: None IMPRESSION: Atrophy and chronic ischemic changes stable from prior study. No acute abnormality. Electronically Signed   By: Franchot Gallo M.D.   On: 09/09/2019 13:56   US RENAL  Result Date: 09/09/2019 CLINICAL DATA:  Initial evaluation for acute renal injury. History of hypertension, diabetes. EXAM: RENAL / URINARY TRACT ULTRASOUND COMPLETE COMPARISON:  Prior CT from earlier the same day. FINDINGS: Right Kidney: Renal measurements: 8.6 x 3.3 x 5.0 cm = volume: 74.6 mL. Diffusely increased echogenicity within the renal parenchyma. No nephrolithiasis or hydronephrosis. No focal renal mass. Left Kidney: Renal measurements: 7.2 x 3.8 x 4.9 cm = volume: 62.5 mL. Diffusely increased echogenicity within the renal parenchyma. No nephrolithiasis or hydronephrosis. No focal renal mass. Bladder: Decompressed with a Foley catheter in place. Other: None. IMPRESSION: 1. Increased echogenicity within the renal parenchyma, compatible with medical renal disease. 2. No hydronephrosis or other significant finding. Electronically Signed   By: Jeannine Boga M.D.   On: 09/09/2019 18:52   DG Chest Port 1 View  Result Date: 09/09/2019 CLINICAL DATA:  Altered mental status EXAM: PORTABLE CHEST 1 VIEW COMPARISON:  07/28/2019 FINDINGS: Normal heart size and mediastinal contours. There is suggestion of left-sided volume loss but this could be  positional as there is no underlying infiltrate. No edema. No effusion or pneumothorax. No acute osseous findings. Implantable loop recorder.  Cholecystectomy clips IMPRESSION: No active disease. Electronically Signed   By: Monte Fantasia M.D.   On: 09/09/2019 11:21    Pending Labs Unresulted Labs (From admission, onward)    Start     Ordered   09/16/19 0500  Creatinine, serum  (enoxaparin (LOVENOX)    CrCl < 30 ml/min)  Weekly,   R    Comments: while  on enoxaparin therapy.    09/09/19 2027   09/10/19 0500  CBC  Tomorrow morning,   R     09/09/19 2027   09/09/19 9030  Basic metabolic panel  5A & 5P,   R (with STAT occurrences)     09/09/19 2027   09/09/19 1741  Osmolality, urine  Once,   STAT     09/09/19 1740   09/09/19 0955  Urine culture  ONCE - STAT,   STAT     09/09/19 0957          Vitals/Pain Today's Vitals   09/09/19 2057 09/09/19 2131 09/09/19 2145 09/09/19 2215  BP:  92/74 106/66 98/63  Pulse:  (!) 102    Resp:  _0 Temp:  (!) 96.8 F (36 C) (!) 97 F (36.1 C) (!) 97 F (36.1 C)  TempSrc:      SpO2:  96% 95% 96%  PainSc: 3        Isolation Precautions No active isolations  Medications Medications  dextrose 5 % and 0.2 % NaCl infusion ( Intravenous New Bag/Given 09/09/19 2132)  insulin aspart (novoLOG) injection 0-6 Units (0 Units Subcutaneous Not Given 09/09/19 2038)  enoxaparin (LOVENOX) injection 30 mg (30 mg Subcutaneous Given 09/09/19 2216)  acetaminophen (TYLENOL) tablet 650 mg (has no administration in time range)    Or  acetaminophen (TYLENOL) suppository 650 mg (has no administration in time range)  cefTRIAXone (ROCEPHIN) 1 g in sodium chloride 0.9 % 100 mL IVPB (1 g Intravenous New Bag/Given 09/09/19 2216)  sodium chloride 0.9 % bolus 1,000 mL (0 mLs Intravenous Stopped 09/09/19 1221)    And  sodium chloride 0.9 % bolus 1,000 mL (0 mLs Intravenous Stopped 09/09/19 1306)  metroNIDAZOLE (FLAGYL) IVPB 500 mg (0 mg Intravenous Stopped  09/09/19 1345)  ceFEPIme (MAXIPIME) 2 g in sodium chloride 0.9 % 100 mL IVPB (0 g Intravenous Stopped 09/09/19 1221)  vancomycin (VANCOCIN) IVPB 1000 mg/200 mL premix (0 mg Intravenous Stopped 09/09/19 1518)  fentaNYL (SUBLIMAZE) injection 50 mcg (50 mcg Intravenous Given 09/09/19 1518)    Mobility non-ambulatory

## 2019-09-09 NOTE — ED Notes (Addendum)
Patient's daughter is at bedside. 

## 2019-09-10 ENCOUNTER — Other Ambulatory Visit: Payer: Self-pay

## 2019-09-10 LAB — GLUCOSE, CAPILLARY
Glucose-Capillary: 157 mg/dL — ABNORMAL HIGH (ref 70–99)
Glucose-Capillary: 176 mg/dL — ABNORMAL HIGH (ref 70–99)
Glucose-Capillary: 184 mg/dL — ABNORMAL HIGH (ref 70–99)
Glucose-Capillary: 210 mg/dL — ABNORMAL HIGH (ref 70–99)
Glucose-Capillary: 214 mg/dL — ABNORMAL HIGH (ref 70–99)
Glucose-Capillary: 248 mg/dL — ABNORMAL HIGH (ref 70–99)
Glucose-Capillary: 98 mg/dL (ref 70–99)

## 2019-09-10 LAB — BASIC METABOLIC PANEL
BUN: 127 mg/dL — ABNORMAL HIGH (ref 8–23)
BUN: 101 mg/dL — ABNORMAL HIGH (ref 8–23)
CO2: 24 mmol/L (ref 22–32)
CO2: 22 mmol/L (ref 22–32)
Calcium: 8.5 mg/dL — ABNORMAL LOW (ref 8.9–10.3)
Calcium: 8.3 mg/dL — ABNORMAL LOW (ref 8.9–10.3)
Chloride: >130 mmol/L (ref 98–111)
Chloride: >130 mmol/L (ref 98–111)
Creatinine, Ser: 2.89 mg/dL — ABNORMAL HIGH (ref 0.44–1.00)
Creatinine, Ser: 2.26 mg/dL — ABNORMAL HIGH (ref 0.44–1.00)
GFR calc Af Amer: 17 mL/min — ABNORMAL LOW (ref >60)
GFR calc Af Amer: 23 mL/min — ABNORMAL LOW (ref >60)
GFR calc non Af Amer: 15 mL/min — ABNORMAL LOW (ref >60)
GFR calc non Af Amer: 20 mL/min — ABNORMAL LOW (ref >60)
Glucose, Bld: 265 mg/dL — ABNORMAL HIGH (ref 70–99)
Glucose, Bld: 244 mg/dL — ABNORMAL HIGH (ref 70–99)
Potassium: 4.3 mmol/L (ref 3.5–5.1)
Potassium: 3.8 mmol/L (ref 3.5–5.1)
Sodium: 171 mmol/L (ref 135–145)
Sodium: 164 mmol/L (ref 135–145)

## 2019-09-10 LAB — CBC
HCT: 44.4 % (ref 36.0–46.0)
Hemoglobin: 12.8 g/dL (ref 12.0–15.0)
MCH: 29.8 pg (ref 26.0–34.0)
MCHC: 28.8 g/dL — ABNORMAL LOW (ref 30.0–36.0)
MCV: 103.5 fL — ABNORMAL HIGH (ref 80.0–100.0)
Platelets: 321 10*3/uL (ref 150–400)
RBC: 4.29 MIL/uL (ref 3.87–5.11)
RDW: 17.2 % — ABNORMAL HIGH (ref 11.5–15.5)
WBC: 17 10*3/uL — ABNORMAL HIGH (ref 4.0–10.5)
nRBC: 0.9 % — ABNORMAL HIGH (ref 0.0–0.2)

## 2019-09-10 LAB — MRSA PCR SCREENING: MRSA by PCR: NEGATIVE

## 2019-09-10 LAB — LACTIC ACID, PLASMA: Lactic Acid, Venous: 2.1 mmol/L (ref 0.5–1.9)

## 2019-09-10 LAB — OSMOLALITY, URINE: Osmolality, Ur: 586 mOsm/kg (ref 300–900)

## 2019-09-10 MED ORDER — SODIUM CHLORIDE 0.9 % IV BOLUS
1500.0000 mL | Freq: Once | INTRAVENOUS | Status: AC
  Administered 2019-09-10: 1500 mL via INTRAVENOUS

## 2019-09-10 MED ORDER — DEXTROSE 5 % IN LACTATED RINGERS IV BOLUS
250.0000 mL | Freq: Once | INTRAVENOUS | Status: AC
  Administered 2019-09-10: 02:00:00 250 mL via INTRAVENOUS

## 2019-09-10 MED ORDER — SODIUM CHLORIDE 0.9 % IV BOLUS
500.0000 mL | Freq: Once | INTRAVENOUS | Status: AC
  Administered 2019-09-10: 500 mL via INTRAVENOUS

## 2019-09-10 MED ORDER — SODIUM CHLORIDE 0.9 % IV BOLUS
1000.0000 mL | Freq: Once | INTRAVENOUS | Status: AC
  Administered 2019-09-10: 04:00:00 1000 mL via INTRAVENOUS

## 2019-09-10 NOTE — Patient Outreach (Signed)
  [facility]) Care Management Chronic Special Needs Program    09/10/2019  Name: GREG ECKRICH, DOB: [birth year]  MRN: [id number]   Ms. Makailee Nudelman is enrolled in a chronic special needs plan for Diabetes. Client admitted on 09/09/19 with dx of dehydration, hypernatremia and altered mental status. Individualized care plan sent to [facility] for admission  [facility] will continue to follow. Peter Garter RN, Jackquline Denmark, CDE Chronic Care Management Coordinator [facility] [phone]

## 2019-09-10 NOTE — Progress Notes (Signed)
Paged [place] MD about patients BP being 78/59, this AM on assessment. Per night shift RN, patients BP has been running low all night.

## 2019-09-10 NOTE — Progress Notes (Signed)
CRITICAL VALUE ALERT  Critical Value:  Sodium-164, Chloride >130  Date & Time Notied:  1746,09/10/2019  Provider Notified: [place] MD   Orders Received/Actions taken: None yet, sodium trending down

## 2019-09-10 NOTE — Progress Notes (Signed)
Both IVs infiltrated, will not flush. MD aware. Cannot give fluid bolus until IV access is obtained. IV team paged STAT for new IV.

## 2019-09-10 NOTE — Progress Notes (Signed)
[facility] Progress Note  Subjective: BP's remain soft, , has 6L IVF's so far, UOP 200 cc since admission, B/Cr down slightly, Na down 171  Vitals:   09/10/19 0948 09/10/19 1000 09/10/19 1100 09/10/19 1200  BP: (!) 91/52 (!) 92/57 (!) 95/57 (!) 88/50  Pulse: 96 86 92 83  Resp: 17 10 11 10   Temp:    97.9 F (36.6 C)  TempSrc:    Axillary  SpO2: 100% 100% 100% 100%  Weight:      Height:        Exam: Gen elderly frail AAF, not responding verbally, eyes closed, moves about, not following commands No rash, cyanosis or gangrene Sclera anicteric, throat not seen  No jvd or bruits Chest clear bilat to bases no rales or wheezing RRR no MRG Abd soft ntnd no mass or ascites +bs GU defer MS no joint effusions or deformity Ext no LE or UE edema, poor skin turgor Neuro as above    Home meds:  - aspirin 81  - divalproex 250 tid/ donepezil 5 qd/ doxepin 25 hs/ escitalopram 10  - omeprazole 20 qd  - ensure enlive tid  - prn's/ vitamins/ supplements       UA mod LE, 6-10 wbc/ ++bact, 0-5rbc   CT abd > normal appearing kidneys no hydro    Assessment/ Rec: 1. Hypernatremia - severe Na+ 179 on admit, improving, cont hypotonic IVF's D5 1/4 at 125 /hr 2. Renal failure - presumably acute, no old records. CT shows normal appearing kidneys. B/Cr down slightly likely just hemodilution as not making much urine. Still hypotensive. Will rebolus NS 1.5 L. Cont IVF's. Overall poor candidate for aggressive/ invasive care. Continue supportive care for now.  Pt is DNR.  3. H/o CVA 4. H/o depression  5. Sepsis - on empiric IV abx, blood cx's neg, poss UTI      Rob Aina Rossbach 09/10/2019, 1:05 PM  Inpatient medications: . Chlorhexidine Gluconate Cloth  6 each Topical Daily  . enoxaparin (LOVENOX) injection  30 mg Subcutaneous Q24H  . insulin aspart  0-6 Units Subcutaneous Q4H  . mouth rinse  15 mL Mouth Rinse BID   . cefTRIAXone (ROCEPHIN)  IV Stopped (09/09/19 2249)   . dextrose 5 % and 0.2 % NaCl 125 mL/hr at 09/10/19 1100   acetaminophen **OR** acetaminophen

## 2019-09-10 NOTE — Progress Notes (Signed)
CRITICAL VALUE ALERT  Critical Value: Na 171; Cl >130  Date & Time Notied:  09/10/2019 9753  Provider Notified: Sharlet Salina, NP  Orders Received/Actions taken:

## 2019-09-10 NOTE — Progress Notes (Signed)
RN paged NP regarding hypotension.  Discussed with Dr Alfonso Patten. Tracy [name], who advised NS bolus to increase BP and help replete fluids.

## 2019-09-10 NOTE — Progress Notes (Signed)
Palliative care brief note  Palliative care consult received.  Attempted to see, but daughter not at bedside and did not reach her.    Rn reports that daughter works 3pm-11pm.  Will plan to reach out to her tomorrow morning.  Micheline Rough, MD [facility] [phone]   NO CHARGE NOTE

## 2019-09-10 NOTE — Progress Notes (Signed)
CRITICAL VALUE ALERT  Critical Value:  Lactic 2.1   Date & Time Notied:  09/10/2019, 0815  Provider Notified: [place] MD  Orders Received/Actions taken: None

## 2019-09-10 NOTE — Progress Notes (Signed)
PROGRESS NOTE    Tracy [name]  BZJ:[id number] DOB: 09-Apr-1941 DOA: 09/09/2019 PCP: Cassandria Anger, MD    Brief Narrative:  Tracy [name] is a 78 y.o. female with past medical history remarkable for dementia, CVA with R sided deficits, HTN, DM, and COVID in Nov 2020 with severe encephalopathy who presents from SNF with few days less responsiveness.  Patient is obtunded and altered and cannot assist with HPI. Per daughter, since getting out of hospital 1 month ago for Covid, the patient's mentation has been very diminished.  Per ER report, over the last several days, her mentation has been diminished, she is been less responsive, and today she was lethargic and hypotensive so she was sent to the ER.  In the ER, she was hypothermic, heart rate 110, blood pressure 70/50, SPO2 90% on 5 L.   Sodium 179, creatinine 3.33 (baseline 0.62-month ago), VBG without acidosis, mild transaminitis, INR 1.4, hemogram concentrated.  Blood cultures were obtained, urinalysis showed leukocytes, chest x-ray was clear, CT head and abdomen were unremarkable.  She was started on vancomycin, cefepime, and Flagyl empirically, given 30 cc/kg crystalloid, and the hospitalist service were asked to evaluate.   Assessment & Plan:   Principal Problem:   Sepsis, unspecified organism ([name]) Active Problems:   HTN (hypertension)   History of CVA (cerebrovascular accident)   Dementia with aggressive behavior ([name])   Diabetes mellitus type 2 in obese ([name])   Hypernatremia   Chronic respiratory failure with hypoxia ([name])   Acute renal failure (ARF) ([name])   Acute metabolic encephalopathy   Coagulopathy ([name])   Transaminitis   Sepsis ([name])  Severe hypernatremia Dehydration Adult failure to thrive Patient presenting from her nursing facility with decreased responsiveness, noted to be hypotensive with elevated sodium level of 178 on presentation.  Etiology likely adult failure to thrive related to dehydration  and extremely poor oral intake.  Urine osmolality 586, urine sodium 14. --Nephrology following, appreciate assistance --Na 178-->173-->171 --Continue IVF with D5 1/4 NS at 46ml/hr --Continue monitor BMP every 12 hours  Hypotension Blood pressure remains low this morning at 91/61.  Will attempt further fluid boluses. --Continue IV fluid hydration as above --Close monitoring in ICU for now --Would avoid any further escalation of care as patient is very debilitated at baseline and unlikely to have meaningful improvement of her overlying function; and patient's family having further discussions whether to transition to comfort measures.  Acute renal failure Baseline creatinine 0.7, admitted with a creatinine 3.13.  Suspect prerenal azotemia from severe dehydration versus ATN from hypotension. FeNa = 0.1%, which is consistent with prerenal azotemia from likely dehydration. --Nephrology following, appreciate assistance --Cr 3.13--> 2.89 --Continue IV fluids as above --Avoid nephrotoxins, renally dose all medications --Follow renal function daily  Sepsis, present on admission Urinary tract infection WBC count 17.0, procalcitonin 0.29 with a lactic acid 4.1 on admission.  Noted endorgan damage with acute renal failure.  Urinalysis with moderate leukoesterase, negative nitrite many bacteria and 6-10 WBCs. 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  Per family report, patient has had significant decline since her recent diagnosis of Covid-19 in November 2020. --Continue supportive care  Hx Essential hypertension Currently not on any antihypertensives outpatient.  Patient is actively hypotensive likely secondary to severe dehydration on  admission. --Continue IV fluid hydration  as above  Hx CVA On asa outpatient, holding for now given poor mental status.  Prolonged QT interval --Avoid QT prolonging agents --Continue to monitor on telemetry  Transaminitis Likely secondary to underlying sepsis versus dehydration versus shock liver from hypotension. --Supportive care, IV antibiotics, IV fluids --Monitor LFTs daily  Coagulopathy INR 1.4, likely secondary to acute illness and hepatic impairment. --Monitor INR daily  Dementia with behavioral disturbance Per family reports, much less interactive following recent Covid-19 infection in November 2020. --Resume donezepil, Depakote, Lexapro when able to take p.o.; but given her failure to thrive, unlikely  Ethics: Long discussion with patient's daughter, [name] bedside this morning.  [name] has very high understanding regarding that her mother is actively dying with little response to treatments.  She has noticed a significant decline since her recent diagnosis of Covid-19 in November 2020; in which her father also had this diagnosis and ultimately passed.  Patient's daughter, who is the eldest child who is the default healthcare per return he agrees that aggressive measures such as intubation and cardiopulmonary resuscitation would only inflict further harm and agrees to transition CODE STATUS to a DNR at this time.  She would like to have further discussions with her other family members including her brother [name] before deciding full comfort measures at this time.  Discussed with [name], will initiate a DNR and continue other measures such as IV fluid hydration and IV antibiotic therapy.  Overall grim prognosis which was relayed by multiple providers to family as well as myself to patient's daughter today [name].    DVT prophylaxis: Lovenox Code Status: DNR Family Communication: Discussed with patient's daughter, [name] at bedside this morning Disposition Plan: Remain  inpatient, stepdown unit, hopeful for transition to comfort measures once other family members in agreement   Consultants:   Palliative care consultation: Pending  Nephrology  Procedures:   None  Antimicrobials:   Ceftriaxone 12/29>>   Subjective: Patient seen and examined bedside, resting comfortably.  Obtunded, unable to participate in any type of conversation this morning.  Discussed with patient's nurse and daughter who is at bedside regarding grim prognosis.  Daughter agrees to transition CODE STATUS to DNR until can have further conversations with family regarding transitioning her medical treatment to comfort measures.  She agrees otherwise to continue current level of care with IV antibiotics and IV fluids and no further escalation at this time.  No other concerns at this time per nursing staff.  Objective: Vitals:   09/10/19 0948 09/10/19 1000 09/10/19 1100 09/10/19 1200  BP: (!) 91/52 (!) 92/57 (!) 95/57 (!) 88/50  Pulse: 96 86 92 83  Resp: Temp:    97.9 F (36.6 C)  TempSrc:    Axillary  SpO2: 100% 100% 100% 100%  Weight:      Height:        Intake/Output Summary (Last 24 hours) at 09/10/2019 1210 Last data filed at 09/10/2019 1100 Gross per 24 hour  Intake 3754.84 ml  Output 100 ml  Net 3654.84 ml   Filed Weights   09/09/19 2300 09/10/19 0457  Weight: 48.1 kg 48.1 kg    Examination:  General exam: Obtunded, chronically ill in appearance, severely cachectic with obvious muscle wasting, dry mucous membranes Respiratory system: Coarse breath sounds bilaterally, no wheezing, normal respiratory effort, on 4 L nasal cannula oxygenating 100% Cardiovascular system: S1 & S2 heard, RRR. No JVD, murmurs, rubs, gallops or clicks. No pedal edema. Gastrointestinal system: Abdomen is nondistended, soft and  nontender. No organomegaly or masses felt. Normal bowel sounds heard. Central nervous system: Nonverbal, obtunded, withdraws from pain, no clear  purposeful movements Extremities: No clear purposeful movements, diffuse severe loss of subcutaneous muscle mass and fat Skin: No rashes, lesions or ulcers Psychiatry: Obtunded    Data Reviewed: I have personally reviewed following labs and imaging studies  CBC: Recent Labs  Lab 09/09/19 0955 09/10/19 0153  WBC 15.0* 17.0*  NEUTROABS 12.4*  --   HGB 16.4* 12.8  HCT 55.6* 44.4  MCV 102.6* 103.5*  PLT 459* 321   Basic Metabolic Panel: Recent Labs  Lab 09/09/19 0955 09/09/19 1702 09/09/19 2055 09/10/19 0153  NA 179* 178* 173* 171*  K 5.3* 4.8 4.6 4.3  CL 128* >130* >130* >130*  CO2 25 21* 25 24  GLUCOSE 245* 246* 214* 265*  BUN 111* 131* 137* 127*  CREATININE 3.33* 3.13* 3.11* 2.89*  CALCIUM 10.0 8.5* 8.9 8.5*   GFR: Estimated Creatinine Clearance: 12.2 mL/min (A) (by C-G formula based on SCr of 2.89 mg/dL (H)). Liver Function Tests: Recent Labs  Lab 09/09/19 0955 09/09/19 1702  AST 49* 38  ALT 98* 77*  ALKPHOS 148* 118  BILITOT 1.2 1.1  PROT 8.7* 7.3  ALBUMIN 4.3 3.6   Recent Labs  Lab 09/09/19 0955  LIPASE 74*   No results for input(s): AMMONIA in the last 168 hours. Coagulation Profile: Recent Labs  Lab 09/09/19 0955  INR 1.4*   Cardiac Enzymes: No results for input(s): CKTOTAL, CKMB, CKMBINDEX, TROPONINI in the last 168 hours. BNP (last 3 results) No results for input(s): PROBNP in the last 8760 hours. HbA1C: No results for input(s): HGBA1C in the last 72 hours. CBG: Recent Labs  Lab 09/09/19 2037 09/10/19 0058 09/10/19 0427  GLUCAP 117* 98 176*   Lipid Profile: No results for input(s): CHOL, HDL, LDLCALC, TRIG, CHOLHDL, LDLDIRECT in the last 72 hours. Thyroid Function Tests: No results for input(s): TSH, T4TOTAL, FREET4, T3FREE, THYROIDAB in the last 72 hours. Anemia Panel: No results for input(s): VITAMINB12, FOLATE, FERRITIN, TIBC, IRON, RETICCTPCT in the last 72 hours. Sepsis Labs: Recent Labs  Lab 09/09/19 0955 09/09/19 1702  09/10/19 0726  PROCALCITON 0.29  --   --   LATICACIDVEN  --  4.1* 2.1*    Recent Results (from the past 240 hour(s))  Blood Culture (routine x 2)     Status: None (Preliminary result)   Collection Time: 09/09/19  9:52 AM   Specimen: BLOOD  Result Value Ref Range Status   Specimen Description   Final    BLOOD SITE NOT SPECIFIED Performed at [facility], [address] [place], [place] [address]    Special Requests   Final    BOTTLES DRAWN AEROBIC AND ANAEROBIC Blood Culture adequate volume Performed at [facility], [address] [place], [place] [address]    Culture   Final    NO GROWTH 1 DAY Performed at [facility], [address] [place], [place] [address]    Report Status PENDING  Incomplete  Blood Culture (routine x 2)     Status: None (Preliminary result)   Collection Time: 09/09/19  9:57 AM   Specimen: BLOOD  Result Value Ref Range Status   Specimen Description   Final    BLOOD BLOOD LEFT ARM Performed at [facility], [address] [place], [place] [address]    Special Requests   Final    BOTTLES DRAWN AEROBIC AND ANAEROBIC  Blood Culture adequate volume Performed at [facility], [address], [place], [place] [address]    Culture   Final    NO GROWTH 1 DAY Performed at [facility], [address] [place], [place] [address]    Report Status PENDING  Incomplete  Urine culture     Status: Abnormal (Preliminary result)   Collection Time: 09/09/19 10:47 AM   Specimen: Urine, Random  Result Value Ref Range Status   Specimen Description   Final    URINE, RANDOM Performed at [facility], [address] [place], [place] [address]    Special Requests   Final    NONE Performed at [facility], [address], [place], [place] [address]    Culture (A)  Final    >=100,000 COLONIES/mL ESCHERICHIA COLI IDENTIFICATION AND  SUSCEPTIBILITIES TO FOLLOW Performed at [facility], [address] [place], [place] [address]    Report Status PENDING  Incomplete  SARS CORONAVIRUS 2 (TAT 6-24 HRS) Nasopharyngeal Nasopharyngeal Swab     Status: None   Collection Time: 09/09/19 10:47 AM   Specimen: Nasopharyngeal Swab  Result Value Ref Range Status   SARS Coronavirus 2 NEGATIVE NEGATIVE Final    Comment: (NOTE) SARS-CoV-2 target nucleic acids are NOT DETECTED. The SARS-CoV-2 RNA is generally detectable in upper and lower respiratory specimens during the acute phase of infection. Negative results do not preclude SARS-CoV-2 infection, do not rule out co-infections with other pathogens, and should not be used as the sole basis for treatment or other patient management decisions. Negative results must be combined with clinical observations, patient history, and epidemiological information. The expected result is Negative. Fact Sheet for Patients: HairSlick.no Fact Sheet for Healthcare Providers: quierodirigir.com This test is not yet approved or cleared by the [place] FDA and  has been authorized for detection and/or diagnosis of SARS-CoV-2 by FDA under an Emergency Use Authorization (EUA). This EUA will remain  in effect (meaning this test can be used) for the duration of the COVID-19 declaration under Section 56 4(b)(1) of the Act, 21 U.S.C. section 360bbb-3(b)(1), unless the authorization is terminated or revoked sooner. Performed at [facility], [address], [place], [place] [address]   MRSA PCR Screening     Status: None   Collection Time: 09/09/19 10:50 PM   Specimen: Nasal Mucosa; Nasopharyngeal  Result Value Ref Range Status   MRSA by PCR NEGATIVE NEGATIVE Final    Comment:        The GeneXpert MRSA Assay (FDA approved for NASAL specimens only), is one component of a comprehensive MRSA colonization surveillance program. It is  not intended to diagnose MRSA infection nor to guide or monitor treatment for MRSA infections. Performed at [facility], [address], [name], [place] [address]          Radiology Studies: CT ABDOMEN PELVIS WO CONTRAST  Result Date: 09/09/2019 CLINICAL DATA:  Altered mental status and abdominal pain. COVID positive. EXAM: CT ABDOMEN AND PELVIS WITHOUT CONTRAST TECHNIQUE: Multidetector CT imaging of the abdomen and pelvis was performed following the standard protocol without IV contrast. COMPARISON:  None. FINDINGS: Lower chest: Minimal bibasilar atelectasis. No infiltrates or effusions. The heart is normal in size. There is moderate tortuosity and ectasia of the thoracic aorta Hepatobiliary: No worrisome hepatic lesions or intrahepatic biliary dilatation. Small low-attenuation lesion in the right hepatic lobe is likely a benign cyst. The gallbladder is surgically absent.  No common bile duct dilatation. Pancreas: No mass, inflammation or ductal dilatation. Spleen: Normal size.  No focal lesions. Adrenals/Urinary Tract: Adrenal glands and kidneys are unremarkable. No renal calculi or hydronephrosis. The bladder appears normal. Stomach/Bowel: The stomach, duodenum, small bowel and colon are grossly normal without oral contrast. No inflammatory changes, mass lesions or obstructive findings. The appendix is normal. Vascular/Lymphatic: The aorta is normal in caliber. Minimal atheroscerlotic calcifications. No mesenteric of retroperitoneal mass or adenopathy. Small scattered lymph nodes are noted. Reproductive: The uterus and ovaries are unremarkable. Other: No pelvic mass or adenopathy. No free pelvic fluid collections. No inguinal mass or adenopathy. No abdominal wall hernia or subcutaneous lesions. Musculoskeletal: No significant bony findings. Advanced lower lumbar degenerative disc disease and facet disease. IMPRESSION: 1. No acute abdominal/pelvic findings, mass lesions or  adenopathy. 2. Status post cholecystectomy.  No biliary dilatation. Electronically Signed   By: Rudie Meyer M.D.   On: 09/09/2019 14:08   CT Head Wo Contrast  Result Date: 09/09/2019 CLINICAL DATA:  Altered mental status. COVID-19 positive. Dementia. EXAM: CT HEAD WITHOUT CONTRAST TECHNIQUE: Contiguous axial images were obtained from the base of the skull through the vertex without intravenous contrast. COMPARISON:  CT head 08/13/2019 FINDINGS: Brain: Cortical atrophy with ventricular enlargement, stable. Hypodensities left basal ganglia appear chronic and unchanged. Negative for acute infarct, hemorrhage, mass. Empty sella again noted. Vascular: Normal arterial flow voids Skull: Negative Sinuses/Orbits: Negative Other: None IMPRESSION: Atrophy and chronic ischemic changes stable from prior study. No acute abnormality. Electronically Signed   By: Marlan Palau M.D.   On: 09/09/2019 13:56   US RENAL  Result Date: 09/09/2019 CLINICAL DATA:  Initial evaluation for acute renal injury. History of hypertension, diabetes. EXAM: RENAL / URINARY TRACT ULTRASOUND COMPLETE COMPARISON:  Prior CT from earlier the same day. FINDINGS: Right Kidney: Renal measurements: 8.6 x 3.3 x 5.0 cm = volume: 74.6 mL. Diffusely increased echogenicity within the renal parenchyma. No nephrolithiasis or hydronephrosis. No focal renal mass. Left Kidney: Renal measurements: 7.2 x 3.8 x 4.9 cm = volume: 62.5 mL. Diffusely increased echogenicity within the renal parenchyma. No nephrolithiasis or hydronephrosis. No focal renal mass. Bladder: Decompressed with a Foley catheter in place. Other: None. IMPRESSION: 1. Increased echogenicity within the renal parenchyma, compatible with medical renal disease. 2. No hydronephrosis or other significant finding. Electronically Signed   By: Rise Mu M.D.   On: 09/09/2019 18:52   DG Chest Port 1 View  Result Date: 09/09/2019 CLINICAL DATA:  Altered mental status EXAM: PORTABLE CHEST  1 VIEW COMPARISON:  07/28/2019 FINDINGS: Normal heart size and mediastinal contours. There is suggestion of left-sided volume loss but this could be positional as there is no underlying infiltrate. No edema. No effusion or pneumothorax. No acute osseous findings. Implantable loop recorder.  Cholecystectomy clips IMPRESSION: No active disease. Electronically Signed   By: Marnee Spring M.D.   On: 09/09/2019 11:21        Scheduled Meds:  Chlorhexidine Gluconate Cloth  6 each Topical Daily   enoxaparin (LOVENOX) injection  30 mg Subcutaneous Q24H   insulin aspart  0-6 Units Subcutaneous Q4H   mouth rinse  15 mL Mouth Rinse BID   Continuous Infusions:  cefTRIAXone (ROCEPHIN)  IV Stopped (09/09/19 2249)   dextrose 5 % and 0.2 % NaCl 125 mL/hr at 09/10/19 1100     LOS: 1 day    Critical Care Time Upon my evaluation, this patient had a high probability of imminent or life-threatening deterioration due to  sepsis, hypotension, acute renal failure, acute metabolic encephalopathy, severe hyponatremia, severe dehydration, which required my direct attention, intervention, and personal management.  I have personally provided 42 minutes of critical care time exclusive of my time spent on separately billable procedures.  Time includes review of laboratory data, radiology results, discussion with consultants, and monitoring for potential decompensation.       Alvira Philips [place], DO [facility] 09/10/2019, 12:10 PM

## 2019-09-11 ENCOUNTER — Ambulatory Visit: Payer: Self-pay | Admitting: *Deleted

## 2019-09-11 LAB — HEPATIC FUNCTION PANEL
ALT: 47 U/L — ABNORMAL HIGH (ref 0–44)
AST: 22 U/L (ref 15–41)
Albumin: 3 g/dL — ABNORMAL LOW (ref 3.5–5.0)
Alkaline Phosphatase: 88 U/L (ref 38–126)
Bilirubin, Direct: 0.1 mg/dL (ref 0.0–0.2)
Indirect Bilirubin: 0.5 mg/dL (ref 0.3–0.9)
Total Bilirubin: 0.6 mg/dL (ref 0.3–1.2)
Total Protein: 5.9 g/dL — ABNORMAL LOW (ref 6.5–8.1)

## 2019-09-11 LAB — CBC
HCT: 37.9 % (ref 36.0–46.0)
Hemoglobin: 11.4 g/dL — ABNORMAL LOW (ref 12.0–15.0)
MCH: 30.9 pg (ref 26.0–34.0)
MCHC: 30.1 g/dL (ref 30.0–36.0)
MCV: 102.7 fL — ABNORMAL HIGH (ref 80.0–100.0)
Platelets: 249 10*3/uL (ref 150–400)
RBC: 3.69 MIL/uL — ABNORMAL LOW (ref 3.87–5.11)
RDW: 17 % — ABNORMAL HIGH (ref 11.5–15.5)
WBC: 10.4 10*3/uL (ref 4.0–10.5)
nRBC: 0.8 % — ABNORMAL HIGH (ref 0.0–0.2)

## 2019-09-11 LAB — URINE CULTURE: Culture: >=100000 — AB

## 2019-09-11 LAB — BASIC METABOLIC PANEL
Anion gap: 15 (ref 5–15)
BUN: 81 mg/dL — ABNORMAL HIGH (ref 8–23)
CO2: 20 mmol/L — ABNORMAL LOW (ref 22–32)
Calcium: 7.9 mg/dL — ABNORMAL LOW (ref 8.9–10.3)
Chloride: 123 mmol/L — ABNORMAL HIGH (ref 98–111)
Creatinine, Ser: 1.63 mg/dL — ABNORMAL HIGH (ref 0.44–1.00)
GFR calc Af Amer: 35 mL/min — ABNORMAL LOW (ref >60)
GFR calc non Af Amer: 30 mL/min — ABNORMAL LOW (ref >60)
Glucose, Bld: 269 mg/dL — ABNORMAL HIGH (ref 70–99)
Potassium: 3.2 mmol/L — ABNORMAL LOW (ref 3.5–5.1)
Sodium: 158 mmol/L — ABNORMAL HIGH (ref 135–145)

## 2019-09-11 LAB — GLUCOSE, CAPILLARY
Glucose-Capillary: 248 mg/dL — ABNORMAL HIGH (ref 70–99)
Glucose-Capillary: 235 mg/dL — ABNORMAL HIGH (ref 70–99)

## 2019-09-11 MED ORDER — GLYCOPYRROLATE 0.2 MG/ML IJ SOLN: 0.2000 mg | INTRAMUSCULAR | Status: DC | PRN

## 2019-09-11 MED ORDER — ONDANSETRON 4 MG PO TBDP: 4.0000 mg | ORAL_TABLET | Freq: Four times a day (QID) | ORAL | Status: DC | PRN

## 2019-09-11 MED ORDER — ONDANSETRON HCL 4 MG/2ML IJ SOLN: 4.0000 mg | Freq: Four times a day (QID) | INTRAMUSCULAR | Status: DC | PRN

## 2019-09-11 MED ORDER — HALOPERIDOL LACTATE 5 MG/ML IJ SOLN
0.5000 mg | INTRAMUSCULAR | Status: DC | PRN
  Administered 2019-09-20: 18:00:00 0.5 mg via INTRAVENOUS
  Filled 2019-09-11: qty 1

## 2019-09-11 MED ORDER — ACETAMINOPHEN 325 MG PO TABS: 650.0000 mg | ORAL_TABLET | Freq: Four times a day (QID) | ORAL | Status: DC | PRN

## 2019-09-11 MED ORDER — HALOPERIDOL 0.5 MG PO TABS: 0.5000 mg | ORAL_TABLET | ORAL | Status: DC | PRN

## 2019-09-11 MED ORDER — BIOTENE DRY MOUTH MT LIQD: 15.0000 mL | OROMUCOSAL | Status: DC | PRN

## 2019-09-11 MED ORDER — HALOPERIDOL LACTATE 2 MG/ML PO CONC
0.5000 mg | ORAL | Status: DC | PRN
  Filled 2019-09-11: qty 0.3

## 2019-09-11 MED ORDER — ACETAMINOPHEN 650 MG RE SUPP: 650.0000 mg | Freq: Four times a day (QID) | RECTAL | Status: DC | PRN

## 2019-09-11 MED ORDER — GLYCOPYRROLATE 1 MG PO TABS: 1.0000 mg | ORAL_TABLET | ORAL | Status: DC | PRN

## 2019-09-11 MED ORDER — POLYVINYL ALCOHOL 1.4 % OP SOLN: 1.0000 [drp] | Freq: Four times a day (QID) | OPHTHALMIC | Status: DC | PRN

## 2019-09-11 MED ORDER — MORPHINE SULFATE (PF) 2 MG/ML IV SOLN
1.0000 mg | INTRAVENOUS | Status: DC | PRN
  Administered 2019-09-13 – 2019-09-21 (×7): 1 mg via INTRAVENOUS
  Filled 2019-09-11 (×7): qty 1

## 2019-09-11 NOTE — Progress Notes (Signed)
I responded to page for spiritual support for Daughter Sunday Spillers) who is at bedside of Pt. Pt was not responsive but awake. Sunday Spillers voice concerns of her sibling dynamic and reassurance of her concerns for mother. I reaffirmed her with words of comfort, empathic listening, and ministry of presence. Sunday Spillers was in tears and thank staff and spiritual care for the compassion we have given her. She said she sensed that her late father is watching from about because her mom is in the same room that he was in when he transitioned.  I offered her continued space for further concerns. She stated that there were other recent deaths within the family and that her siblings may not have time to heal from this and that maybe that's why it has been difficult with them and their mother in the state that she is in.  She continued with stating she was trying to connect with her siblings about the health of their mother, but she said it was difficult.  She said their relationship has been pretty good before the health of their parents changed. Also, she said that her siblings and staff are working on getting time to have them visit their mother, hopefully tonight. I affirmed that we are available if she needs continued support.   Chaplain Resident  Fidel Levy MA [phone]

## 2019-09-11 NOTE — Progress Notes (Signed)
[place] transferred to 1609. Report was called to [facility].

## 2019-09-11 NOTE — Progress Notes (Signed)
PROGRESS NOTE    Tracy PaganiniYvonne D [name]  ZOX:[id number] DOB: 01/16/1941 DOA: 09/09/2019 PCP: Tresa GarterPlotnikov, Aleksei V, MD    Brief Narrative:  Tracy [name] is a 78 y.o. female with past medical history remarkable for dementia, CVA with R sided deficits, HTN, DM, and COVID in Nov 2020 with severe encephalopathy who presents from SNF with few days less responsiveness.  Patient is obtunded and altered and cannot assist with HPI. Per daughter, since getting out of hospital 1 month ago for Covid, the patient's mentation has been very diminished.  Per ER report, over the last several days, her mentation has been diminished, she is been less responsive, and today she was lethargic and hypotensive so she was sent to the ER.  In the ER, she was hypothermic, heart rate 110, blood pressure 70/50, SPO2 90% on 5 L.   Sodium 179, creatinine 3.33 (baseline 0.317-month ago), VBG without acidosis, mild transaminitis, INR 1.4, hemogram concentrated.  Blood cultures were obtained, urinalysis showed leukocytes, chest x-ray was clear, CT head and abdomen were unremarkable.  She was started on vancomycin, cefepime, and Flagyl empirically, given 30 cc/kg crystalloid, and the hospitalist service were asked to evaluate.   Assessment & Plan:   Principal Problem:   Sepsis, unspecified organism ([name]) Active Problems:   HTN (hypertension)   History of CVA (cerebrovascular accident)   Dementia with aggressive behavior ([name])   Diabetes mellitus type 2 in obese ([name])   Hypernatremia   Chronic respiratory failure with hypoxia ([name])   Acute renal failure (ARF) ([name])   Acute metabolic encephalopathy   Coagulopathy ([name])   Transaminitis   Sepsis ([name])  Severe hypernatremia Dehydration Adult failure to thrive Patient presenting from her nursing facility with decreased responsiveness, noted to be hypotensive with elevated sodium level of 178 on presentation.  Etiology likely adult failure to thrive related to dehydration  and extremely poor oral intake.  Urine osmolality 586, urine sodium 14.  Nephrology was consulted and followed during hospital course.  She was started on aggressive IV fluid hydration with improvement of her sodium from 178 to 158; although no appreciable changes in her mental status. Further discussion with patient's daughter/HCPOA, [name] this morning, regarding grim prognosis, she has understanding of the current disease process and her failure to thrive and wishes to proceed with comfort measures and discontinuing all aggressive care at this time. --Discontinue IV fluids, IV antibiotics --Supportive care with morphine, Haldol, Robinul  Hypotension Patient was significantly hypotensive on presentation, likely secondary to severe volume depletion.  She was given several IV fluid boluses and aggressively hydrated with minimal effect.  Patient's healthcare pair returning, daughter [name] did not wish any further escalation and now transitioning to comfort measures at this time.  Acute renal failure Baseline creatinine 0.7, admitted with a creatinine 3.13.  Suspect prerenal azotemia from severe dehydration versus ATN from hypotension. FeNa = 0.1%, which is consistent with prerenal azotemia from likely dehydration.  Nephrology followed during hospitalization.  Patient's creatinine improved from 3.13 to 1.63; although no changes in her overall functional ability/mental status was noted.  Patient is daughter now transitioning to comfort measures as above.  Sepsis, present on admission Ecoli Urinary tract infection WBC count 17.0, procalcitonin 0.29 with a lactic acid 4.1 on admission.  Noted endorgan damage with acute renal failure.  Urinalysis with moderate leukoesterase, negative nitrite many bacteria and 6-10 WBCs.  Urine culture with E. coli.  Patient initially was started on cefepime and transitioned to ceftriaxone in which he completed  2 days of IV antibiotics.  Now discontinued as transitioning to  comfort measures.  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  Per family report, patient has had significant decline since her recent diagnosis of Covid-19 in November 2020.  No significant improvement in terms of mentation with correction of hypernatremia, renal failure or UTI.  Now transitioning to comfort measures per family wishes.  Hx CVA On asa outpatient, now transition to comfort measures  Prolonged QT interval --Discontinue telemetry  Transaminitis Likely secondary to underlying sepsis versus dehydration versus shock liver from hypotension.  Coagulopathy INR 1.4, likely secondary to acute illness and hepatic impairment.  Dementia with behavioral disturbance Per family reports, much less interactive following recent Covid-19 infection in November 2020.  Ethics: Long discussion with patient's daughter, [name] over telephone this morning.  [name] has very high understanding regarding that her mother is actively dying with little response to treatments.  She has noticed a significant decline since her recent diagnosis of Covid-19 in November 2020; in which her father also had this diagnosis and ultimately passed.  Patient's daughter, who is the eldest child who is the default healthcare power of attorney agrees that the time now is to transition her mother's care to a more palliative/comfort measure given no significant clinical response to treatment for severe dehydration, renal failure and UTI.  Continue all aggressive measures such as IV fluid hydration, IV antibiotics and support with pain control, antiemetics, antianxiolytic.   DVT prophylaxis: Comfort/hospice Code Status: DNR Family Communication: Discussed with patient's daughter, [name] via telephone this morning Disposition Plan: Transition to comfort measures, transfer to 6 E.   Consultants:   Palliative care    Nephrology  Procedures:   None  Antimicrobials:   Ceftriaxone 12/29 -12/30   Subjective: Patient seen and examined bedside, resting, mild discomfort.  Nonverbal, somnolent only responds to painful stimuli.  No significant change in her mental status with correction of her hyponatremia, renal failure and treatment of her underlying E. coli UTI.  Discussed with patient's daughter via telephone this morning regarding grim prognosis and she agrees to transition her care to comfort measures today.  No other acute concerns at this time per nursing staff.  Objective: Vitals:   09/11/19 0900 09/11/19 1000 09/11/19 1100 09/11/19 1210  BP: (!) 93/57 (!) 95/54 (!) 107/58 (!) 100/59  Pulse:   85 85  Resp: Temp:  (!) 96.8 F (36 C) 97.9 F (36.6 C) 98.5 F (36.9 C)  TempSrc:    Axillary  SpO2:   100% 100%  Weight:      Height:        Intake/Output Summary (Last 24 hours) at 09/11/2019 1234 Last data filed at 09/11/2019 1100 Gross per 24 hour  Intake 4558.67 ml  Output 500 ml  Net 4058.67 ml   Filed Weights   09/09/19 2300 09/10/19 0457 09/11/19 0500  Weight: 48.1 kg 48.1 kg 53.8 kg    Examination:  General exam: Obtunded, chronically ill in appearance, severely cachectic with obvious muscle wasting, dry mucous membranes Respiratory system: Coarse breath sounds bilaterally, no wheezing, normal respiratory effort, on 4 L nasal cannula oxygenating 100% Cardiovascular system: S1 & S2 heard, RRR. No JVD, murmurs, rubs, gallops or clicks. No pedal edema. Gastrointestinal system: Abdomen is nondistended, soft and nontender. No organomegaly or masses felt. Normal bowel sounds heard. Central nervous system: Nonverbal, obtunded, withdraws from pain, no clear purposeful movements  Extremities: No clear purposeful movements, diffuse severe loss of subcutaneous muscle mass and fat Skin: No rashes, lesions or ulcers Psychiatry: Obtunded    Data Reviewed: I have personally  reviewed following labs and imaging studies  CBC: Recent Labs  Lab 09/09/19 0955 09/10/19 0153 09/11/19 0713  WBC 15.0* 17.0* 10.4  NEUTROABS 12.4*  --   --   HGB 16.4* 12.8 11.4*  HCT 55.6* 44.4 37.9  MCV 102.6* 103.5* 102.7*  PLT 459* 321 249   Basic Metabolic Panel: Recent Labs  Lab 09/09/19 1702 09/09/19 2055 09/10/19 0153 09/10/19 1639 09/11/19 0713  NA 178* 173* 171* 164* 158*  K 4.8 4.6 4.3 3.8 3.2*  CL >130* >130* >130* >130* 123*  CO2 21* 25 24 22  20*  GLUCOSE 246* 214* 265* 244* 269*  BUN 131* 137* 127* 101* 81*  CREATININE 3.13* 3.11* 2.89* 2.26* 1.63*  CALCIUM 8.5* 8.9 8.5* 8.3* 7.9*   GFR: Estimated Creatinine Clearance: 22.5 mL/min (A) (by C-G formula based on SCr of 1.63 mg/dL (H)). Liver Function Tests: Recent Labs  Lab 09/09/19 0955 09/09/19 1702 09/11/19 0713  AST 49* 38 22  ALT 98* 77* 47*  ALKPHOS 148* 118 88  BILITOT 1.2 1.1 0.6  PROT 8.7* 7.3 5.9*  ALBUMIN 4.3 3.6 3.0*   Recent Labs  Lab 09/09/19 0955  LIPASE 74*   No results for input(s): AMMONIA in the last 168 hours. Coagulation Profile: Recent Labs  Lab 09/09/19 0955  INR 1.4*   Cardiac Enzymes: No results for input(s): CKTOTAL, CKMB, CKMBINDEX, TROPONINI in the last 168 hours. BNP (last 3 results) No results for input(s): PROBNP in the last 8760 hours. HbA1C: No results for input(s): HGBA1C in the last 72 hours. CBG: Recent Labs  Lab 09/10/19 1544 09/10/19 1914 09/10/19 2312 09/11/19 0341 09/11/19 0825  GLUCAP 157* 184* 210* 248* 235*   Lipid Profile: No results for input(s): CHOL, HDL, LDLCALC, TRIG, CHOLHDL, LDLDIRECT in the last 72 hours. Thyroid Function Tests: No results for input(s): TSH, T4TOTAL, FREET4, T3FREE, THYROIDAB in the last 72 hours. Anemia Panel: No results for input(s): VITAMINB12, FOLATE, FERRITIN, TIBC, IRON, RETICCTPCT in the last 72 hours. Sepsis Labs: Recent Labs  Lab 09/09/19 0955 09/09/19 1702 09/10/19 0726  PROCALCITON 0.29  --    --   LATICACIDVEN  --  4.1* 2.1*    Recent Results (from the past 240 hour(s))  Blood Culture (routine x 2)     Status: None (Preliminary result)   Collection Time: 09/09/19  9:52 AM   Specimen: BLOOD  Result Value Ref Range Status   Specimen Description   Final    BLOOD SITE NOT SPECIFIED Performed at [facility], [address] [place], [place]    Special Requests   Final    BOTTLES DRAWN AEROBIC AND ANAEROBIC Blood Culture adequate volume Performed at [facility], [address], [place], [place]    Culture   Final    NO GROWTH 2 DAYS Performed at [facility], [address] [place], [place]    Report Status PENDING  Incomplete  Blood Culture (routine x 2)     Status: None (Preliminary result)   Collection Time: 09/09/19  9:57 AM   Specimen: BLOOD  Result Value Ref Range Status   Specimen Description   Final    BLOOD BLOOD LEFT ARM Performed at [facility], [address] [place], [place]    Special  Requests   Final    BOTTLES DRAWN AEROBIC AND ANAEROBIC Blood Culture adequate volume Performed at [facility], [address] [place], [place] [address]    Culture   Final    NO GROWTH 2 DAYS Performed at [facility], [address] [place], [place] [address]    Report Status PENDING  Incomplete  Urine culture     Status: Abnormal   Collection Time: 09/09/19 10:47 AM   Specimen: Urine, Random  Result Value Ref Range Status   Specimen Description   Final    URINE, RANDOM Performed at [facility], [address], [place], [place] [address]    Special Requests   Final    NONE Performed at [facility], [address], [place], [place] [address]    Culture >=100,000 COLONIES/mL ESCHERICHIA COLI (A)  Final   Report Status 09/11/2019 FINAL  Final   Organism ID, Bacteria ESCHERICHIA COLI (A)   Final      Susceptibility   Escherichia coli - MIC*    AMPICILLIN 8 SENSITIVE Sensitive     CEFAZOLIN <=4 SENSITIVE Sensitive     CEFTRIAXONE <=0.25 SENSITIVE Sensitive     CIPROFLOXACIN <=0.25 SENSITIVE Sensitive     GENTAMICIN <=1 SENSITIVE Sensitive     IMIPENEM <=0.25 SENSITIVE Sensitive     NITROFURANTOIN <=16 SENSITIVE Sensitive     TRIMETH/SULFA <=20 SENSITIVE Sensitive     AMPICILLIN/SULBACTAM 4 SENSITIVE Sensitive     PIP/TAZO <=4 SENSITIVE Sensitive     * >=100,000 COLONIES/mL ESCHERICHIA COLI  SARS CORONAVIRUS 2 (TAT 6-24 HRS) Nasopharyngeal Nasopharyngeal Swab     Status: None   Collection Time: 09/09/19 10:47 AM   Specimen: Nasopharyngeal Swab  Result Value Ref Range Status   SARS Coronavirus 2 NEGATIVE NEGATIVE Final    Comment: (NOTE) SARS-CoV-2 target nucleic acids are NOT DETECTED. The SARS-CoV-2 RNA is generally detectable in upper and lower respiratory specimens during the acute phase of infection. Negative results do not preclude SARS-CoV-2 infection, do not rule out co-infections with other pathogens, and should not be used as the sole basis for treatment or other patient management decisions. Negative results must be combined with clinical observations, patient history, and epidemiological information. The expected result is Negative. Fact Sheet for Patients: HairSlick.no Fact Sheet for Healthcare Providers: quierodirigir.com This test is not yet approved or cleared by the [place] FDA and  has been authorized for detection and/or diagnosis of SARS-CoV-2 by FDA under an Emergency Use Authorization (EUA). This EUA will remain  in effect (meaning this test can be used) for the duration of the COVID-19 declaration under Section 56 4(b)(1) of the Act, 21 U.S.C. section 360bbb-3(b)(1), unless the authorization is terminated or revoked sooner. Performed at [facility], [address],  [place], [place] [address]   MRSA PCR Screening     Status: None   Collection Time: 09/09/19 10:50 PM   Specimen: Nasal Mucosa; Nasopharyngeal  Result Value Ref Range Status   MRSA by PCR NEGATIVE NEGATIVE Final    Comment:        The GeneXpert MRSA Assay (FDA approved for NASAL specimens only), is one component of a comprehensive MRSA colonization surveillance program. It is not intended to diagnose MRSA infection nor to guide or monitor treatment for MRSA infections. Performed at [facility], [address], [place], [place] [address]          Radiology Studies: CT ABDOMEN  PELVIS WO CONTRAST  Result Date: 09/09/2019 CLINICAL DATA:  Altered mental status and abdominal pain. COVID positive. EXAM: CT ABDOMEN AND PELVIS WITHOUT CONTRAST TECHNIQUE: Multidetector CT imaging of the abdomen and pelvis was performed following the standard protocol without IV contrast. COMPARISON:  None. FINDINGS: Lower chest: Minimal bibasilar atelectasis. No infiltrates or effusions. The heart is normal in size. There is moderate tortuosity and ectasia of the thoracic aorta Hepatobiliary: No worrisome hepatic lesions or intrahepatic biliary dilatation. Small low-attenuation lesion in the right hepatic lobe is likely a benign cyst. The gallbladder is surgically absent. No common bile duct dilatation. Pancreas: No mass, inflammation or ductal dilatation. Spleen: Normal size.  No focal lesions. Adrenals/Urinary Tract: Adrenal glands and kidneys are unremarkable. No renal calculi or hydronephrosis. The bladder appears normal. Stomach/Bowel: The stomach, duodenum, small bowel and colon are grossly normal without oral contrast. No inflammatory changes, mass lesions or obstructive findings. The appendix is normal. Vascular/Lymphatic: The aorta is normal in caliber. Minimal atheroscerlotic calcifications. No mesenteric of retroperitoneal mass or adenopathy. Small scattered lymph nodes are noted.  Reproductive: The uterus and ovaries are unremarkable. Other: No pelvic mass or adenopathy. No free pelvic fluid collections. No inguinal mass or adenopathy. No abdominal wall hernia or subcutaneous lesions. Musculoskeletal: No significant bony findings. Advanced lower lumbar degenerative disc disease and facet disease. IMPRESSION: 1. No acute abdominal/pelvic findings, mass lesions or adenopathy. 2. Status post cholecystectomy.  No biliary dilatation. Electronically Signed   By: Marijo Sanes M.D.   On: 09/09/2019 14:08   CT Head Wo Contrast  Result Date: 09/09/2019 CLINICAL DATA:  Altered mental status. COVID-19 positive. Dementia. EXAM: CT HEAD WITHOUT CONTRAST TECHNIQUE: Contiguous axial images were obtained from the base of the skull through the vertex without intravenous contrast. COMPARISON:  CT head 08/13/2019 FINDINGS: Brain: Cortical atrophy with ventricular enlargement, stable. Hypodensities left basal ganglia appear chronic and unchanged. Negative for acute infarct, hemorrhage, mass. Empty sella again noted. Vascular: Normal arterial flow voids Skull: Negative Sinuses/Orbits: Negative Other: None IMPRESSION: Atrophy and chronic ischemic changes stable from prior study. No acute abnormality. Electronically Signed   By: Franchot Gallo M.D.   On: 09/09/2019 13:56   US RENAL  Result Date: 09/09/2019 CLINICAL DATA:  Initial evaluation for acute renal injury. History of hypertension, diabetes. EXAM: RENAL / URINARY TRACT ULTRASOUND COMPLETE COMPARISON:  Prior CT from earlier the same day. FINDINGS: Right Kidney: Renal measurements: 8.6 x 3.3 x 5.0 cm = volume: 74.6 mL. Diffusely increased echogenicity within the renal parenchyma. No nephrolithiasis or hydronephrosis. No focal renal mass. Left Kidney: Renal measurements: 7.2 x 3.8 x 4.9 cm = volume: 62.5 mL. Diffusely increased echogenicity within the renal parenchyma. No nephrolithiasis or hydronephrosis. No focal renal mass. Bladder: Decompressed  with a Foley catheter in place. Other: None. IMPRESSION: 1. Increased echogenicity within the renal parenchyma, compatible with medical renal disease. 2. No hydronephrosis or other significant finding. Electronically Signed   By: Jeannine Boga M.D.   On: 09/09/2019 18:52        Scheduled Meds: . Chlorhexidine Gluconate Cloth  6 each Topical Daily  . mouth rinse  15 mL Mouth Rinse BID   Continuous Infusions:    LOS: 2 days    Time spent: 35 minutes spent on chart review, discussion with nursing staff, consultants, updating family and interview/physical exam; more than 50% of that time was spent in counseling and/or coordination of care.  Obinna Ehresman J [place] DO [facility] 09/11/2019, 12:34 PM

## 2019-09-11 NOTE — Progress Notes (Signed)
Note that pt is now comfort care. Will sign off.   Kelly Splinter, MD 09/11/2019, 1:59 PM

## 2019-09-12 DIAGNOSIS — Z515 Encounter for palliative care: Secondary | ICD-10-CM

## 2019-09-12 NOTE — Progress Notes (Signed)
Tracy [name]'s daughter [name] called and wanted to speak to Dr. [place] about putting a feeding tube in her mother. Informed her the doctor was not here and that she needed to call back in the morning.

## 2019-09-12 NOTE — Progress Notes (Signed)
PROGRESS NOTE    SHERRITA RIEDERER  TKW:[id number] DOB: [birth year] DOA: 09/09/2019 PCP: Tresa Garter, MD    Brief Narrative:  Tracy [name] is a 79 y.o. female with past medical history remarkable for dementia, CVA with R sided deficits, HTN, DM, and COVID in Nov 2020 with severe encephalopathy who presents from SNF with few days less responsiveness.  Patient is obtunded and altered and cannot assist with HPI. Per daughter, since getting out of hospital 1 month ago for Covid, the patient's mentation has been very diminished.  Per ER report, over the last several days, her mentation has been diminished, she is been less responsive, and today she was lethargic and hypotensive so she was sent to the ER.  In the ER, she was hypothermic, heart rate 110, blood pressure 70/50, SPO2 90% on 5 L.   Sodium 179, creatinine 3.33 (baseline 0.57-month ago), VBG without acidosis, mild transaminitis, INR 1.4, hemogram concentrated.  Blood cultures were obtained, urinalysis showed leukocytes, chest x-ray was clear, CT head and abdomen were unremarkable.  She was started on vancomycin, cefepime, and Flagyl empirically, given 30 cc/kg crystalloid, and the hospitalist service were asked to evaluate.   Assessment & Plan:   Principal Problem:   Sepsis, unspecified organism ([name]) Active Problems:   HTN (hypertension)   History of CVA (cerebrovascular accident)   Dementia with aggressive behavior ([name])   Diabetes mellitus type 2 in obese [facility])   Palliative care encounter   Hypernatremia   Chronic respiratory failure with hypoxia ([name])   Acute renal failure (ARF) ([name])   Acute metabolic encephalopathy   Coagulopathy ([name])   Transaminitis   Sepsis ([name])  Severe hypernatremia Dehydration Adult failure to thrive Patient presenting from her nursing facility with decreased responsiveness, noted to be hypotensive with elevated sodium level of 178 on presentation.  Etiology likely adult failure to  thrive related to dehydration and extremely poor oral intake.  Urine osmolality 586, urine sodium 14.  Nephrology was consulted and followed during hospital course.  She was started on aggressive IV fluid hydration with improvement of her sodium from 178 to 158; although no appreciable changes in her mental status. Further discussion with patient's daughter/HCPOA, Tracy [name], regarding grim prognosis, she has understanding of the current disease process and her failure to thrive and wishes to proceed with comfort measures and discontinuing all aggressive care. --Discontinue IV fluids, IV antibiotics --Supportive care with morphine, Haldol, Robinul  Hypotension Patient was significantly hypotensive on presentation, likely secondary to severe volume depletion.  She was given several IV fluid boluses and aggressively hydrated with minimal effect.  Patient's healthcare pair returning, daughter Tracy [name] did not wish any further escalation and now transitioned to comfort measures at this time.  Acute renal failure Baseline creatinine 0.7, admitted with a creatinine 3.13.  Suspect prerenal azotemia from severe dehydration versus ATN from hypotension. FeNa = 0.1%, which is consistent with prerenal azotemia from likely dehydration.  Nephrology followed during hospitalization.  Patient's creatinine improved from 3.13 to 1.63; although no changes in her overall functional ability/mental status was noted.  Patient is daughter now transitioning to comfort measures as above.  Sepsis, present on admission Ecoli Urinary tract infection WBC count 17.0, procalcitonin 0.29 with a lactic acid 4.1 on admission.  Noted endorgan damage with acute renal failure.  Urinalysis with moderate leukoesterase, negative nitrite many bacteria and 6-10 WBCs.  Urine culture with E. coli.  Patient initially was started on cefepime and transitioned to ceftriaxone in which he completed  2 days of IV antibiotics.  Now discontinued as transitioning to  comfort measures.  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  Per family report, patient has had significant decline since her recent diagnosis of Covid-19 in November 2020.  No significant improvement in terms of mentation with correction of hypernatremia, renal failure or UTI.  Now transitioning to comfort measures per family wishes.  Hx CVA On asa outpatient, now transition to comfort measures  Prolonged QT interval --Discontinued telemetry  Transaminitis Likely secondary to underlying sepsis versus dehydration versus shock liver from hypotension.  Coagulopathy INR 1.4, likely secondary to acute illness and hepatic impairment.  Dementia with behavioral disturbance Per family reports, much less interactive following recent Covid-19 infection in November 2020.  Ethics: Long discussion with patient's daughter, Tracy ElmSylvia over telephone this morning.  Tracy ElmSylvia has very high understanding regarding that her mother is actively dying with little response to treatments.  She has noticed a significant decline since her recent diagnosis of Covid-19 in November 2020; in which her father also had this diagnosis and ultimately passed.  Patient's daughter, who is the eldest child who is the default healthcare power of attorney agrees that the time now is to transition her mother's care to a more palliative/comfort measure given no significant clinical response to treatment for severe dehydration, renal failure and UTI.  Continue all aggressive measures such as IV fluid hydration, IV antibiotics and support with pain control, antiemetics, antianxiolytic.   DVT prophylaxis: Comfort/hospice Code Status: DNR Family Communication: No family present at bedside this morning Disposition Plan: Expected in-hospital death   Consultants:   Palliative care   Nephrology  Procedures:   None  Antimicrobials:   Ceftriaxone  12/29 -12/30   Subjective: Patient seen and examined bedside, resting, calm no apparent discomfort.  Nonresponsive to verbal or tactile stimuli.  Continues on comfort measures, with expected in-hospital death no other acute concerns at this time per nursing staff.  Objective: Vitals:   09/11/19 1000 09/11/19 1100 09/11/19 1145 09/11/19 1210  BP: (!) 95/54 (!) 107/58  (!) 100/59  Pulse:  85  85  Resp: 13 15 16 12   Temp: (!) 96.8 F (36 C) 97.9 F (36.6 C) 97.9 F (36.6 C) 98.5 F (36.9 C)  TempSrc: Core   Axillary  SpO2:  100%  100%  Weight:      Height:        Intake/Output Summary (Last 24 hours) at 09/12/2019 1330 Last data filed at 09/12/2019 0511 Gross per 24 hour  Intake --  Output 675 ml  Net -675 ml   Filed Weights   09/09/19 2300 09/10/19 0457 09/11/19 0500  Weight: 48.1 kg 48.1 kg 53.8 kg    Examination:  General exam: Obtunded, chronically ill in appearance, severely cachectic with obvious muscle wasting, dry mucous membranes Respiratory system: Coarse breath sounds bilaterally, no wheezing, normal respiratory effort Cardiovascular system: S1 & S2 heard, RRR. No JVD, murmurs, rubs, gallops or clicks. No pedal edema. Gastrointestinal system: Abdomen is nondistended, soft and nontender. No organomegaly or masses felt. Normal bowel sounds heard. Central nervous system: Nonverbal, obtunded, withdraws from pain, no clear purposeful movements Extremities: No clear purposeful movements, diffuse severe loss of subcutaneous muscle mass and fat Skin: No rashes, lesions or ulcers Psychiatry: Obtunded    Data Reviewed: I have personally reviewed following labs and imaging studies  CBC: Recent Labs  Lab 09/09/19 0955 09/10/19 0153 09/11/19 0713  WBC  15.0* 17.0* 10.4  NEUTROABS 12.4*  --   --   HGB 16.4* 12.8 11.4*  HCT 55.6* 44.4 37.9  MCV 102.6* 103.5* 102.7*  PLT 459* 321 249   Basic Metabolic Panel: Recent Labs  Lab 09/09/19 1702 09/09/19 2055  09/10/19 0153 09/10/19 1639 09/11/19 0713  NA 178* 173* 171* 164* 158*  K 4.8 4.6 4.3 3.8 3.2*  CL >130* >130* >130* >130* 123*  CO2 21* 25 24 22  20*  GLUCOSE 246* 214* 265* 244* 269*  BUN 131* 137* 127* 101* 81*  CREATININE 3.13* 3.11* 2.89* 2.26* 1.63*  CALCIUM 8.5* 8.9 8.5* 8.3* 7.9*   GFR: Estimated Creatinine Clearance: 22.5 mL/min (A) (by C-G formula based on SCr of 1.63 mg/dL (H)). Liver Function Tests: Recent Labs  Lab 09/09/19 0955 09/09/19 1702 09/11/19 0713  AST 49* 38 22  ALT 98* 77* 47*  ALKPHOS 148* 118 88  BILITOT 1.2 1.1 0.6  PROT 8.7* 7.3 5.9*  ALBUMIN 4.3 3.6 3.0*   Recent Labs  Lab 09/09/19 0955  LIPASE 74*   No results for input(s): AMMONIA in the last 168 hours. Coagulation Profile: Recent Labs  Lab 09/09/19 0955  INR 1.4*   Cardiac Enzymes: No results for input(s): CKTOTAL, CKMB, CKMBINDEX, TROPONINI in the last 168 hours. BNP (last 3 results) No results for input(s): PROBNP in the last 8760 hours. HbA1C: No results for input(s): HGBA1C in the last 72 hours. CBG: Recent Labs  Lab 09/10/19 1544 09/10/19 1914 09/10/19 2312 09/11/19 0341 09/11/19 0825  GLUCAP 157* 184* 210* 248* 235*   Lipid Profile: No results for input(s): CHOL, HDL, LDLCALC, TRIG, CHOLHDL, LDLDIRECT in the last 72 hours. Thyroid Function Tests: No results for input(s): TSH, T4TOTAL, FREET4, T3FREE, THYROIDAB in the last 72 hours. Anemia Panel: No results for input(s): VITAMINB12, FOLATE, FERRITIN, TIBC, IRON, RETICCTPCT in the last 72 hours. Sepsis Labs: Recent Labs  Lab 09/09/19 0955 09/09/19 1702 09/10/19 0726  PROCALCITON 0.29  --   --   LATICACIDVEN  --  4.1* 2.1*    Recent Results (from the past 240 hour(s))  Blood Culture (routine x 2)     Status: None (Preliminary result)   Collection Time: 09/09/19  9:52 AM   Specimen: BLOOD  Result Value Ref Range Status   Specimen Description   Final    BLOOD SITE NOT SPECIFIED Performed at [facility], [address], [place], [place]    Special Requests   Final    BOTTLES DRAWN AEROBIC AND ANAEROBIC Blood Culture adequate volume Performed at [facility], [address] [place], [place]    Culture   Final    NO GROWTH 3 DAYS Performed at [facility], [address] [place], [place]    Report Status PENDING  Incomplete  Blood Culture (routine x 2)     Status: None (Preliminary result)   Collection Time: 09/09/19  9:57 AM   Specimen: BLOOD  Result Value Ref Range Status   Specimen Description   Final    BLOOD BLOOD LEFT ARM Performed at [facility], [address], [place], [place]    Special Requests   Final    BOTTLES DRAWN AEROBIC AND ANAEROBIC Blood Culture adequate volume Performed at [facility], [address], [place], [place]    Culture   Final    NO GROWTH 3 DAYS Performed at [facility], [address] [place], [place] [address]    Report Status PENDING  Incomplete  Urine culture     Status: Abnormal   Collection Time: 09/09/19 10:47 AM   Specimen: Urine, Random  Result Value Ref Range Status   Specimen Description   Final    URINE, RANDOM Performed at [facility], [address], [place], [place] [address]    Special Requests   Final    NONE Performed at [facility], [address] [place], [place] [address]    Culture >=100,000 COLONIES/mL ESCHERICHIA COLI (A)  Final   Report Status 09/11/2019 FINAL  Final   Organism ID, Bacteria ESCHERICHIA COLI (A)  Final      Susceptibility   Escherichia coli - MIC*    AMPICILLIN 8 SENSITIVE Sensitive     CEFAZOLIN <=4 SENSITIVE Sensitive     CEFTRIAXONE <=0.25 SENSITIVE Sensitive     CIPROFLOXACIN <=0.25 SENSITIVE Sensitive     GENTAMICIN <=1 SENSITIVE Sensitive     IMIPENEM <=0.25 SENSITIVE Sensitive     NITROFURANTOIN <=16  SENSITIVE Sensitive     TRIMETH/SULFA <=20 SENSITIVE Sensitive     AMPICILLIN/SULBACTAM 4 SENSITIVE Sensitive     PIP/TAZO <=4 SENSITIVE Sensitive     * >=100,000 COLONIES/mL ESCHERICHIA COLI  SARS CORONAVIRUS 2 (TAT 6-24 HRS) Nasopharyngeal Nasopharyngeal Swab     Status: None   Collection Time: 09/09/19 10:47 AM   Specimen: Nasopharyngeal Swab  Result Value Ref Range Status   SARS Coronavirus 2 NEGATIVE NEGATIVE Final    Comment: (NOTE) SARS-CoV-2 target nucleic acids are NOT DETECTED. The SARS-CoV-2 RNA is generally detectable in upper and lower respiratory specimens during the acute phase of infection. Negative results do not preclude SARS-CoV-2 infection, do not rule out co-infections with other pathogens, and should not be used as the sole basis for treatment or other patient management decisions. Negative results must be combined with clinical observations, patient history, and epidemiological information. The expected result is Negative. Fact Sheet for Patients: HairSlick.no Fact Sheet for Healthcare Providers: quierodirigir.com This test is not yet approved or cleared by the [place] FDA and  has been authorized for detection and/or diagnosis of SARS-CoV-2 by FDA under an Emergency Use Authorization (EUA). This EUA will remain  in effect (meaning this test can be used) for the duration of the COVID-19 declaration under Section 56 4(b)(1) of the Act, 21 U.S.C. section 360bbb-3(b)(1), unless the authorization is terminated or revoked sooner. Performed at [facility], [address] [place], [place] [address]   MRSA PCR Screening     Status: None   Collection Time: 09/09/19 10:50 PM   Specimen: Nasal Mucosa; Nasopharyngeal  Result Value Ref Range Status   MRSA by PCR NEGATIVE NEGATIVE Final    Comment:        The GeneXpert MRSA Assay (FDA approved for NASAL specimens only), is one component of  a comprehensive MRSA colonization surveillance program. It is not intended to diagnose MRSA infection nor to guide or monitor treatment for MRSA infections. Performed at [facility], [address] [place], [place] [address]          Radiology Studies: No results found.      Scheduled Meds: . Chlorhexidine Gluconate Cloth  6 each Topical Daily  . mouth rinse  15 mL Mouth Rinse BID   Continuous Infusions:    LOS: 3 days    Time spent: 25 minutes spent on chart review, discussion with  nursing staff, consultants, updating family and interview/physical exam; more than 50% of that time was spent in counseling and/or coordination of care.  Bryn Saline J [place] DO [facility] 09/12/2019, 1:30 PM

## 2019-09-12 NOTE — Progress Notes (Signed)
Patient opens eyes when spoken to but doesn't respond. Tried to give oral care and patient would not open her mouth. Moisturizer applied to lips and patient was repositioned q 2 hr.Family came in to visit,a son, [name] and a sister. They were upset the patient was comfort care and she wasn't being fed. Explained their sister [name] had made her a  DNR, comfort care and suggested they should speak with their sister [name]. Dr. [place] was  contacted and he spoke with the family over the phone.

## 2019-09-13 DIAGNOSIS — Z7189 Other specified counseling: Secondary | ICD-10-CM

## 2019-09-13 MED ORDER — ENSURE ENLIVE PO LIQD
237.0000 mL | Freq: Two times a day (BID) | ORAL | Status: DC
  Administered 2019-09-14: 237 mL via ORAL

## 2019-09-13 MED ORDER — DEXTROSE 5 % IV SOLN: INTRAVENOUS | Status: DC

## 2019-09-13 MED ORDER — DEXTROSE-NACL 5-0.9 % IV SOLN: INTRAVENOUS | Status: DC

## 2019-09-13 NOTE — Progress Notes (Signed)
Palliative care brief note  Palliative care consult received.  Discussed with 2 daughter and one of her sons.  Oldest daughter has been advocating for comfort care, but other family very concerned with nutrition and hydration.  She was restarted on fluids today.  Plan to start intake as desired and recheck labs in AM.  Full consult to follow.  Romie Minus, MD [facility] Palliative Medicine Team [phone]

## 2019-09-13 NOTE — Progress Notes (Signed)
PROGRESS NOTE    Tracy [name]  PPJ:[id number] DOB: 05-19-1941 DOA: 09/09/2019 PCP: Tresa Garter, MD   Brief Narrative: Patient is a 79 year old female with history of dementia, CVA with right-sided residual weakness, hypertension, diabetes, recent infection with Covid in November 2020 with severe encephalopathy who presents from skilled nursing facility with worsening mental status.  Patient was obtunded, altered on presentation.  Since her Covid infection, his mentation has been severely affected.  She became lethargic, hypotensive in the nursing home so she was sent to ER.  On presentation she was hypothermic, tachycardic, had AKI, sodium of 179.  Chest x-ray did not show pneumonia.  CT head/abdomen were unremarkable.  He was he was started on broad-spectrum antibiotics. Given her poor prognosis, multiple discussion was held with the family and her eldest daughter [name]  made her comfort care.  Currently the daughter has been on tremendous pressure from other family members regarding that decision.  Her other family members think that  she still has a chance to get better  and are interested on putting a feeding tube.  I have requested for palliative care consultation given this confusion.  Assessment & Plan:   Principal Problem:   Sepsis, unspecified organism ([name]) Active Problems:   HTN (hypertension)   History of CVA (cerebrovascular accident)   Dementia with aggressive behavior ([name])   Diabetes mellitus type 2 in obese [facility])   Palliative care encounter   Hypernatremia   Chronic respiratory failure with hypoxia ([name])   Acute renal failure (ARF) ([name])   Acute metabolic encephalopathy   Coagulopathy ([name])   Transaminitis   Sepsis ([name])   Severe hypenatremia/dehydration/failure to thrive: Presented with worsening mental status, decreased responsiveness, hypotension, tachycardia.  Sodium was elevated to 178 on presentation.  Urine osmolality was 586, urine sodium was 14.  Nephrology was consulted on presentation.  Nephrology has signed off because she was changed to comfort care.. No blood work has been obtained since 09/11/2019. Family requested for putting a feeding tube.  I talked to the decision-maker daughter [name] and she understands that feeding tube does not help.  I have not put an order for feeding tube.  I will start on gentle IV fluids for today until we have full support from the family for making her comfort care.  Sepsis present on admission/UTI: Elevated white cell counts, lactic acid on admission.  Urine culture showed E. coli.  She was ceftriaxone which was stopped 2 days ago because she was made comfort care.  Hypertension: BP soft.  Continue IV fluids  AKI: Baseline creatinine is normal.  Presented with creatinine of 3.  Prerenal.  Continue gentle IV fluids.  Check BMP tomorrow.  Covid-19 encephalopathy/dementia: Not alert and oriented.  Continue to monitor mental status.  Also has toxic metabolic encephalopathy secondary to dehydration, UTI, AKI.  Patient also has dementia at baseline.  She has history of CVA  Transaminitis: Secondary to underlying sepsis/dehydration/shock liver from hypotension.  Goals of care: As mentioned above, ongoing confusion and disagreement in the family regarding her care.  Eldest  daughter, [name], is very understandable and she is interested on making her comfort care but she is not official power of attorney.  She is discussing with other family members for conclusive decision.  I have requested palliative care evaluation.  I talked to Dr. Neale Burly today.  Continue comfort care measures like morphine, glycopyrrolate, anxiolytics for now.            DVT prophylaxis:None Code Status:  DNR Family Communication: Sunday Spillers, the daughter Disposition Plan: Anticipate hospital death versus residential hospice   Consultants: Palliative care  Procedures: None  Antimicrobials:  Anti-infectives (From admission,  onward)   Start     Dose/Rate Route Frequency Ordered Stop   09/09/19 2200  cefTRIAXone (ROCEPHIN) 1 g in sodium chloride 0.9 % 100 mL IVPB  Status:  Discontinued     1 g 200 mL/hr over 30 Minutes Intravenous Every 24 hours 09/09/19 2027 09/11/19 0840   09/09/19 1000  metroNIDAZOLE (FLAGYL) IVPB 500 mg     500 mg 100 mL/hr over 60 Minutes Intravenous  Once 09/09/19 0957 09/09/19 1345   09/09/19 1000  ceFEPIme (MAXIPIME) 2 g in sodium chloride 0.9 % 100 mL IVPB     2 g 200 mL/hr over 30 Minutes Intravenous  Once 09/09/19 0959 09/09/19 1221   09/09/19 1000  vancomycin (VANCOCIN) IVPB 1000 mg/200 mL premix     1,000 mg 200 mL/hr over 60 Minutes Intravenous  Once 09/09/19 7829 09/09/19 1518      Subjective:  Patient seen and examined the bedside this morning.  Looks uncomfortable, tossing and turning on the bed.  Was not in respiratory distress.  Was not alert or oriented. Objective: Vitals:   09/11/19 1100 09/11/19 1145 09/11/19 1210 09/13/19 0609  BP: (!) 107/58  (!) 100/59 (!) 96/53  Pulse: 85  85 89  Resp: 15 16 12 16   Temp: 97.9 F (36.6 C) 97.9 F (36.6 C) 98.5 F (36.9 C) 97.7 F (36.5 C)  TempSrc:   Axillary Oral  SpO2: 100%  100% 100%  Weight:      Height:        Intake/Output Summary (Last 24 hours) at 09/13/2019 1349 Last data filed at 09/12/2019 2210 Gross per 24 hour  Intake -  Output 500 ml  Net -500 ml   Filed Weights   09/09/19 2300 09/10/19 0457 09/11/19 0500  Weight: 48.1 kg 48.1 kg 53.8 kg    Examination:  General exam: Chronically ill looking, in mild to moderate distress HEENT: Ear/Nose normal on gross exam Respiratory system: Bilateral equal air entry,  no wheezes or crackles on anterior auscultation Cardiovascular system: S1 & S2 heard, RRR. No JVD, murmurs, rubs, gallops or clicks. No pedal edema. Gastrointestinal system: Abdomen is nondistended, soft and nontender. No organomegaly or masses felt. Normal bowel sounds heard. Central nervous  system: Not Alert and oriented.  Extremities: No edema, no clubbing ,no cyanosis, Skin: No rashes, lesions or ulcers,no icterus ,no pallor   Data Reviewed: I have personally reviewed following labs and imaging studies  CBC: Recent Labs  Lab 09/09/19 0955 09/10/19 0153 09/11/19 0713  WBC 15.0* 17.0* 10.4  NEUTROABS 12.4*  --   --   HGB 16.4* 12.8 11.4*  HCT 55.6* 44.4 37.9  MCV 102.6* 103.5* 102.7*  PLT 459* 321 562   Basic Metabolic Panel: Recent Labs  Lab 09/09/19 1702 09/09/19 2055 09/10/19 0153 09/10/19 1639 09/11/19 0713  NA 178* 173* 171* 164* 158*  K 4.8 4.6 4.3 3.8 3.2*  CL >130* >130* >130* >130* 123*  CO2 21* 25 24 22  20*  GLUCOSE 246* 214* 265* 244* 269*  BUN 131* 137* 127* 101* 81*  CREATININE 3.13* 3.11* 2.89* 2.26* 1.63*  CALCIUM 8.5* 8.9 8.5* 8.3* 7.9*   GFR: Estimated Creatinine Clearance: 22.5 mL/min (A) (by C-G formula based on SCr of 1.63 mg/dL (H)). Liver Function Tests: Recent Labs  Lab 09/09/19 0955 09/09/19 1702 09/11/19 0713  AST  49* 38 22  ALT 98* 77* 47*  ALKPHOS 148* 118 88  BILITOT 1.2 1.1 0.6  PROT 8.7* 7.3 5.9*  ALBUMIN 4.3 3.6 3.0*   Recent Labs  Lab 09/09/19 0955  LIPASE 74*   No results for input(s): AMMONIA in the last 168 hours. Coagulation Profile: Recent Labs  Lab 09/09/19 0955  INR 1.4*   Cardiac Enzymes: No results for input(s): CKTOTAL, CKMB, CKMBINDEX, TROPONINI in the last 168 hours. BNP (last 3 results) No results for input(s): PROBNP in the last 8760 hours. HbA1C: No results for input(s): HGBA1C in the last 72 hours. CBG: Recent Labs  Lab 09/10/19 1544 09/10/19 1914 09/10/19 2312 09/11/19 0341 09/11/19 0825  GLUCAP 157* 184* 210* 248* 235*   Lipid Profile: No results for input(s): CHOL, HDL, LDLCALC, TRIG, CHOLHDL, LDLDIRECT in the last 72 hours. Thyroid Function Tests: No results for input(s): TSH, T4TOTAL, FREET4, T3FREE, THYROIDAB in the last 72 hours. Anemia Panel: No results for  input(s): VITAMINB12, FOLATE, FERRITIN, TIBC, IRON, RETICCTPCT in the last 72 hours. Sepsis Labs: Recent Labs  Lab 09/09/19 0955 09/09/19 1702 09/10/19 0726  PROCALCITON 0.29  --   --   LATICACIDVEN  --  4.1* 2.1*    Recent Results (from the past 240 hour(s))  Blood Culture (routine x 2)     Status: None (Preliminary result)   Collection Time: 09/09/19  9:52 AM   Specimen: BLOOD  Result Value Ref Range Status   Specimen Description   Final    BLOOD SITE NOT SPECIFIED Performed at [facility], [address] [place], [place] [address]    Special Requests   Final    BOTTLES DRAWN AEROBIC AND ANAEROBIC Blood Culture adequate volume Performed at [facility], [address], [place], [place] [address]    Culture   Final    NO GROWTH 4 DAYS Performed at [facility], [address], [place], [place] [address]    Report Status PENDING  Incomplete  Blood Culture (routine x 2)     Status: None (Preliminary result)   Collection Time: 09/09/19  9:57 AM   Specimen: BLOOD  Result Value Ref Range Status   Specimen Description   Final    BLOOD BLOOD LEFT ARM Performed at [facility], [address], [place], [place] [address]    Special Requests   Final    BOTTLES DRAWN AEROBIC AND ANAEROBIC Blood Culture adequate volume Performed at [facility], [address] [place], [place] [address]    Culture   Final    NO GROWTH 4 DAYS Performed at [facility], [address] [place], [place] [address]    Report Status PENDING  Incomplete  Urine culture     Status: Abnormal   Collection Time: 09/09/19 10:47 AM   Specimen: Urine, Random  Result Value Ref Range Status   Specimen Description   Final    URINE, RANDOM Performed at [facility], [address], [place], [place] [address]    Special Requests   Final    NONE Performed at [facility], [address], [place], [place] [address]    Culture >=100,000 COLONIES/mL ESCHERICHIA COLI (A)  Final   Report Status 09/11/2019 FINAL  Final   Organism ID, Bacteria ESCHERICHIA COLI (A)  Final      Susceptibility   Escherichia coli - MIC*    AMPICILLIN 8 SENSITIVE Sensitive  CEFAZOLIN <=4 SENSITIVE Sensitive     CEFTRIAXONE <=0.25 SENSITIVE Sensitive     CIPROFLOXACIN <=0.25 SENSITIVE Sensitive     GENTAMICIN <=1 SENSITIVE Sensitive     IMIPENEM <=0.25 SENSITIVE Sensitive     NITROFURANTOIN <=16 SENSITIVE Sensitive     TRIMETH/SULFA <=20 SENSITIVE Sensitive     AMPICILLIN/SULBACTAM 4 SENSITIVE Sensitive     PIP/TAZO <=4 SENSITIVE Sensitive     * >=100,000 COLONIES/mL ESCHERICHIA COLI  SARS CORONAVIRUS 2 (TAT 6-24 HRS) Nasopharyngeal Nasopharyngeal Swab     Status: None   Collection Time: 09/09/19 10:47 AM   Specimen: Nasopharyngeal Swab  Result Value Ref Range Status   SARS Coronavirus 2 NEGATIVE NEGATIVE Final    Comment: (NOTE) SARS-CoV-2 target nucleic acids are NOT DETECTED. The SARS-CoV-2 RNA is generally detectable in upper and lower respiratory specimens during the acute phase of infection. Negative results do not preclude SARS-CoV-2 infection, do not rule out co-infections with other pathogens, and should not be used as the sole basis for treatment or other patient management decisions. Negative results must be combined with clinical observations, patient history, and epidemiological information. The expected result is Negative. Fact Sheet for Patients: HairSlick.no Fact Sheet for Healthcare Providers: quierodirigir.com This test is not yet approved or cleared by the [place] FDA and  has been authorized for detection and/or diagnosis of SARS-CoV-2 by FDA under an Emergency Use Authorization (EUA). This EUA will remain  in effect (meaning this test can be used) for the duration of the COVID-19 declaration under  Section 56 4(b)(1) of the Act, 21 U.S.C. section 360bbb-3(b)(1), unless the authorization is terminated or revoked sooner. Performed at [facility], [address] [place], [place] [address]   MRSA PCR Screening     Status: None   Collection Time: 09/09/19 10:50 PM   Specimen: Nasal Mucosa; Nasopharyngeal  Result Value Ref Range Status   MRSA by PCR NEGATIVE NEGATIVE Final    Comment:        The GeneXpert MRSA Assay (FDA approved for NASAL specimens only), is one component of a comprehensive MRSA colonization surveillance program. It is not intended to diagnose MRSA infection nor to guide or monitor treatment for MRSA infections. Performed at [facility], [address] [place], [place] [address]          Radiology Studies: No results found.      Scheduled Meds: . Chlorhexidine Gluconate Cloth  6 each Topical Daily  . mouth rinse  15 mL Mouth Rinse BID   Continuous Infusions: . dextrose 5 % and 0.9% NaCl 75 mL/hr at 09/13/19 1313     LOS: 4 days    Time spent: 35 mins.More than 50% of that time was spent in counseling and/or coordination of care.      Burnadette Pop, MD [facility] Pager [phone]  If 7PM-7AM, please contact night-coverage [email or web address] Password [email or web address] 09/13/2019, 1:49 PM

## 2019-09-13 NOTE — Progress Notes (Signed)
Patients daughter Tracy [name] called requesting that MD call her because she now wants her mother to be put on a feeding tube. Dr Renford Dills on unit and made aware.

## 2019-09-14 LAB — CBC WITH DIFFERENTIAL/PLATELET
Abs Immature Granulocytes: 0.07 K/uL (ref 0.00–0.07)
Basophils Absolute: 0 K/uL (ref 0.0–0.1)
Basophils Relative: 0 %
Eosinophils Absolute: 0.4 K/uL (ref 0.0–0.5)
Eosinophils Relative: 3 %
HCT: 36.3 % (ref 36.0–46.0)
Hemoglobin: 11 g/dL — ABNORMAL LOW (ref 12.0–15.0)
Immature Granulocytes: 1 %
Lymphocytes Relative: 13 %
Lymphs Abs: 1.5 K/uL (ref 0.7–4.0)
MCH: 30.6 pg (ref 26.0–34.0)
MCHC: 30.3 g/dL (ref 30.0–36.0)
MCV: 100.8 fL — ABNORMAL HIGH (ref 80.0–100.0)
Monocytes Absolute: 0.8 K/uL (ref 0.1–1.0)
Monocytes Relative: 7 %
Neutro Abs: 8.9 K/uL — ABNORMAL HIGH (ref 1.7–7.7)
Neutrophils Relative %: 76 %
Platelets: 205 K/uL (ref 150–400)
RBC: 3.6 MIL/uL — ABNORMAL LOW (ref 3.87–5.11)
RDW: 17.3 % — ABNORMAL HIGH (ref 11.5–15.5)
WBC: 11.6 K/uL — ABNORMAL HIGH (ref 4.0–10.5)
nRBC: 0 % (ref 0.0–0.2)

## 2019-09-14 LAB — CULTURE, BLOOD (ROUTINE X 2)
Culture: NO GROWTH
Culture: NO GROWTH
Special Requests: ADEQUATE
Special Requests: ADEQUATE

## 2019-09-14 LAB — BASIC METABOLIC PANEL
Anion gap: 9 (ref 5–15)
BUN: 30 mg/dL — ABNORMAL HIGH (ref 8–23)
CO2: 25 mmol/L (ref 22–32)
Calcium: 8.4 mg/dL — ABNORMAL LOW (ref 8.9–10.3)
Chloride: 130 mmol/L — ABNORMAL HIGH (ref 98–111)
Creatinine, Ser: 0.89 mg/dL (ref 0.44–1.00)
GFR calc Af Amer: >60 mL/min (ref >60)
GFR calc non Af Amer: >60 mL/min (ref >60)
Glucose, Bld: 190 mg/dL — ABNORMAL HIGH (ref 70–99)
Potassium: 3.7 mmol/L (ref 3.5–5.1)
Sodium: 164 mmol/L (ref 135–145)

## 2019-09-14 MED ORDER — LIP MEDEX EX OINT
TOPICAL_OINTMENT | CUTANEOUS | Status: AC
  Administered 2019-09-14: 1
  Filled 2019-09-14: qty 7

## 2019-09-14 MED ORDER — DEXTROSE 5 % IV SOLN
INTRAVENOUS | Status: DC
  Administered 2019-09-14: 13:00:00 1000 mL via INTRAVENOUS

## 2019-09-14 MED ORDER — SODIUM CHLORIDE 0.9 % IV SOLN
1.0000 g | INTRAVENOUS | Status: DC
  Administered 2019-09-14 – 2019-09-17 (×4): 1 g via INTRAVENOUS
  Filled 2019-09-14 (×4): qty 1
  Filled 2019-09-14: qty 10

## 2019-09-14 NOTE — Progress Notes (Signed)
PROGRESS NOTE    Tracy [name]  DUK:[id number] DOB: 28-Nov-1940 DOA: 09/09/2019 PCP: Cassandria Anger, MD   Brief Narrative: Patient is a 79 year old female with history of dementia, CVA with right-sided residual weakness, hypertension, diabetes, recent infection with Covid in November 2020 with severe encephalopathy who presents from skilled nursing facility with worsening mental status.  Patient was obtunded, altered on presentation.  Since her Covid infection, his mentation has been severely affected.  She became lethargic, hypotensive in the nursing home so she was sent to ER.  On presentation she was hypothermic, tachycardic, had AKI, sodium of 179.  Chest x-ray did not show pneumonia.  CT head/abdomen were unremarkable.  He was he was started on broad-spectrum antibiotics. Given her poor prognosis, multiple discussion was held with the family and her eldest daughter Sunday Spillers  made her comfort care.  Currently the daughter has been on tremendous pressure from other family members regarding that decision.  Her other family members think that  she still has a chance to get better  and are interested on putting a feeding tube. We requested for palliative care consultation given this confusion.    09/14/2019: Palliative care closely following.  Her overall status is same as yesterday.  She is not in any acute distress.  She opens her eyes when calling her name and tries to speak.  We have started on IV fluids.  Lab work this morning showed severe hypernatremia.  Fluids changed to D5W.  She has been started on diet  Assessment & Plan:   Principal Problem:   Sepsis, unspecified organism ([place]) Active Problems:   HTN (hypertension)   History of CVA (cerebrovascular accident)   Dementia with aggressive behavior ([place])   Diabetes mellitus type 2 in obese [facility])   Palliative care encounter   Hypernatremia   Chronic respiratory failure with hypoxia ([place])   Acute renal failure (ARF) ([name])   Acute  metabolic encephalopathy   Coagulopathy ([name])   Transaminitis   Sepsis ([place])   Severe hypenatremia/dehydration/failure to thrive: Presented with worsening mental status, decreased responsiveness, hypotension, tachycardia.  Sodium was elevated to 178 on presentation.  Urine osmolality was 586, urine sodium was 14. Nephrology was consulted on presentation.  Nephrology has signed off because she was changed to comfort care.. No blood work has been obtained since 09/11/2019. Family requested for putting a feeding tube.  I talked to the decision-maker daughter Sunday Spillers and she understands that feeding tube does not help.  I have not put an order for feeding tube.  We have started on gentle IV fluids for today until we have full support from the family for making her comfort care.  Sepsis present on admission/UTI: Elevated white cell counts, lactic acid on admission.  Urine culture showed E. coli.  She was ceftriaxone which was stopped 3 days ago because she was made comfort care.  She just completed 2 days of antibiotics.  I  add ceftriaxone because she is not formally in comfort care.  Hypertension: BP soft.  Continue IV fluids  AKI: Baseline creatinine is normal.  Presented with creatinine of 3.  Prerenal.  Continue gentle IV fluids.  Improving  Covid-19 encephalopathy/dementia: Not alert and oriented.  Continue to monitor mental status.  Also has toxic metabolic encephalopathy secondary to dehydration, UTI, AKI.  Patient also has dementia at baseline.  She has history of CVA  Transaminitis: Secondary to underlying sepsis/dehydration/shock liver from hypotension.  Goals of care: As mentioned above, ongoing confusion and disagreement in  the family regarding her care.  Eldest  daughter, [name], is very understandable and she is interested on making her comfort care but she is not official power of attorney.  She is discussing with other family members for conclusive decision.  Dr. Neale Burly from  palliative care closely following.  Continue comfort care measures like morphine, glycopyrrolate, anxiolytics for now.            DVT prophylaxis:None Code Status: DNR Family Communication: [name], the daughter on 09/13/19 Disposition Plan: Undetermined at this point   Consultants: Palliative care  Procedures: None  Antimicrobials:  Anti-infectives (From admission, onward)   Start     Dose/Rate Route Frequency Ordered Stop   09/09/19 2200  cefTRIAXone (ROCEPHIN) 1 g in sodium chloride 0.9 % 100 mL IVPB  Status:  Discontinued     1 g 200 mL/hr over 30 Minutes Intravenous Every 24 hours 09/09/19 2027 09/11/19 0840   09/09/19 1000  metroNIDAZOLE (FLAGYL) IVPB 500 mg     500 mg 100 mL/hr over 60 Minutes Intravenous  Once 09/09/19 0957 09/09/19 1345   09/09/19 1000  ceFEPIme (MAXIPIME) 2 g in sodium chloride 0.9 % 100 mL IVPB     2 g 200 mL/hr over 30 Minutes Intravenous  Once 09/09/19 0959 09/09/19 1221   09/09/19 1000  vancomycin (VANCOCIN) IVPB 1000 mg/200 mL premix     1,000 mg 200 mL/hr over 60 Minutes Intravenous  Once 09/09/19 0959 09/09/19 1518         Objective: Vitals:   09/11/19 1145 09/11/19 1210 09/13/19 0609 09/14/19 0543  BP:  (!) 100/59 (!) 96/53 118/62  Pulse:  85 89 92  Resp: 16 12 16    Temp: 97.9 F (36.6 C) 98.5 F (36.9 C) 97.7 F (36.5 C)   TempSrc:  Axillary Oral   SpO2:  100% 100% 90%  Weight:      Height:        Intake/Output Summary (Last 24 hours) at 09/14/2019 1339 Last data filed at 09/14/2019 11/12/2019 Gross per 24 hour  Intake 813.98 ml  Output 800 ml  Net 13.98 ml   Filed Weights   09/09/19 2300 09/10/19 0457 09/11/19 0500  Weight: 48.1 kg 48.1 kg 53.8 kg    Examination:  General exam: Chronically ill looking, into in  distress HEENT: Ear/Nose normal on gross exam Respiratory system: Bilateral equal air entry,  no wheezes or crackles on anterior auscultation Cardiovascular system: S1 & S2 heard, RRR. No JVD, murmurs, rubs,  gallops or clicks. No pedal edema. Gastrointestinal system: Abdomen is nondistended, soft and nontender. No organomegaly or masses felt. Normal bowel sounds heard. Central nervous system: Not Alert and oriented.  Extremities: No edema, no clubbing ,no cyanosis, Skin: No rashes, lesions or ulcers,no icterus ,no pallor   Data Reviewed: I have personally reviewed following labs and imaging studies  CBC: Recent Labs  Lab 09/09/19 0955 09/10/19 0153 09/11/19 0713 09/14/19 0828  WBC 15.0* 17.0* 10.4 11.6*  NEUTROABS 12.4*  --   --  8.9*  HGB 16.4* 12.8 11.4* 11.0*  HCT 55.6* 44.4 37.9 36.3  MCV 102.6* 103.5* 102.7* 100.8*  PLT 459* 321 249 205   Basic Metabolic Panel: Recent Labs  Lab 09/09/19 2055 09/10/19 0153 09/10/19 1639 09/11/19 0713 09/14/19 0828  NA 173* 171* 164* 158* 164*  K 4.6 4.3 3.8 3.2* 3.7  CL >130* >130* >130* 123* 130*  CO2 25 24 22  20* 25  GLUCOSE 214* 265* 244* 269* 190*  BUN 137* 127*  101* 81* 30*  CREATININE 3.11* 2.89* 2.26* 1.63* 0.89  CALCIUM 8.9 8.5* 8.3* 7.9* 8.4*   GFR: Estimated Creatinine Clearance: 41.2 mL/min (by C-G formula based on SCr of 0.89 mg/dL). Liver Function Tests: Recent Labs  Lab 09/09/19 0955 09/09/19 1702 09/11/19 0713  AST 49* 38 22  ALT 98* 77* 47*  ALKPHOS 148* 118 88  BILITOT 1.2 1.1 0.6  PROT 8.7* 7.3 5.9*  ALBUMIN 4.3 3.6 3.0*   Recent Labs  Lab 09/09/19 0955  LIPASE 74*   No results for input(s): AMMONIA in the last 168 hours. Coagulation Profile: Recent Labs  Lab 09/09/19 0955  INR 1.4*   Cardiac Enzymes: No results for input(s): CKTOTAL, CKMB, CKMBINDEX, TROPONINI in the last 168 hours. BNP (last 3 results) No results for input(s): PROBNP in the last 8760 hours. HbA1C: No results for input(s): HGBA1C in the last 72 hours. CBG: Recent Labs  Lab 09/10/19 1544 09/10/19 1914 09/10/19 2312 09/11/19 0341 09/11/19 0825  GLUCAP 157* 184* 210* 248* 235*   Lipid Profile: No results for input(s):  CHOL, HDL, LDLCALC, TRIG, CHOLHDL, LDLDIRECT in the last 72 hours. Thyroid Function Tests: No results for input(s): TSH, T4TOTAL, FREET4, T3FREE, THYROIDAB in the last 72 hours. Anemia Panel: No results for input(s): VITAMINB12, FOLATE, FERRITIN, TIBC, IRON, RETICCTPCT in the last 72 hours. Sepsis Labs: Recent Labs  Lab 09/09/19 0955 09/09/19 1702 09/10/19 0726  PROCALCITON 0.29  --   --   LATICACIDVEN  --  4.1* 2.1*    Recent Results (from the past 240 hour(s))  Blood Culture (routine x 2)     Status: None   Collection Time: 09/09/19  9:52 AM   Specimen: BLOOD  Result Value Ref Range Status   Specimen Description   Final    BLOOD SITE NOT SPECIFIED Performed at [facility], [address] [place], [place] [address]    Special Requests   Final    BOTTLES DRAWN AEROBIC AND ANAEROBIC Blood Culture adequate volume Performed at [facility], [address] [place], [place] [address]    Culture   Final    NO GROWTH 5 DAYS Performed at [facility], [address], [place], [place] [address]    Report Status 09/14/2019 FINAL  Final  Blood Culture (routine x 2)     Status: None   Collection Time: 09/09/19  9:57 AM   Specimen: BLOOD  Result Value Ref Range Status   Specimen Description   Final    BLOOD BLOOD LEFT ARM Performed at [facility], [address], [place], [place] [address]    Special Requests   Final    BOTTLES DRAWN AEROBIC AND ANAEROBIC Blood Culture adequate volume Performed at [facility], [address], [place], [place] [address]    Culture   Final    NO GROWTH 5 DAYS Performed at [facility], [address], [place], [place] [address]    Report Status 09/14/2019 FINAL  Final  Urine culture     Status: Abnormal   Collection Time: 09/09/19 10:47 AM   Specimen: Urine, Random  Result Value Ref Range Status   Specimen Description   Final    URINE,  RANDOM Performed at [facility], [address], [place], [place] [address]    Special Requests   Final    NONE Performed at [facility], [address], [place], [place] [address]  Culture >=100,000 COLONIES/mL ESCHERICHIA COLI (A)  Final   Report Status 09/11/2019 FINAL  Final   Organism ID, Bacteria ESCHERICHIA COLI (A)  Final      Susceptibility   Escherichia coli - MIC*    AMPICILLIN 8 SENSITIVE Sensitive     CEFAZOLIN <=4 SENSITIVE Sensitive     CEFTRIAXONE <=0.25 SENSITIVE Sensitive     CIPROFLOXACIN <=0.25 SENSITIVE Sensitive     GENTAMICIN <=1 SENSITIVE Sensitive     IMIPENEM <=0.25 SENSITIVE Sensitive     NITROFURANTOIN <=16 SENSITIVE Sensitive     TRIMETH/SULFA <=20 SENSITIVE Sensitive     AMPICILLIN/SULBACTAM 4 SENSITIVE Sensitive     PIP/TAZO <=4 SENSITIVE Sensitive     * >=100,000 COLONIES/mL ESCHERICHIA COLI  SARS CORONAVIRUS 2 (TAT 6-24 HRS) Nasopharyngeal Nasopharyngeal Swab     Status: None   Collection Time: 09/09/19 10:47 AM   Specimen: Nasopharyngeal Swab  Result Value Ref Range Status   SARS Coronavirus 2 NEGATIVE NEGATIVE Final    Comment: (NOTE) SARS-CoV-2 target nucleic acids are NOT DETECTED. The SARS-CoV-2 RNA is generally detectable in upper and lower respiratory specimens during the acute phase of infection. Negative results do not preclude SARS-CoV-2 infection, do not rule out co-infections with other pathogens, and should not be used as the sole basis for treatment or other patient management decisions. Negative results must be combined with clinical observations, patient history, and epidemiological information. The expected result is Negative. Fact Sheet for Patients: HairSlick.no Fact Sheet for Healthcare Providers: quierodirigir.com This test is not yet approved or cleared by the [place] FDA and  has been authorized for detection and/or  diagnosis of SARS-CoV-2 by FDA under an Emergency Use Authorization (EUA). This EUA will remain  in effect (meaning this test can be used) for the duration of the COVID-19 declaration under Section 56 4(b)(1) of the Act, 21 U.S.C. section 360bbb-3(b)(1), unless the authorization is terminated or revoked sooner. Performed at [name], [address], [place], [place] [address]   MRSA PCR Screening     Status: None   Collection Time: 09/09/19 10:50 PM   Specimen: Nasal Mucosa; Nasopharyngeal  Result Value Ref Range Status   MRSA by PCR NEGATIVE NEGATIVE Final    Comment:        The GeneXpert MRSA Assay (FDA approved for NASAL specimens only), is one component of a comprehensive MRSA colonization surveillance program. It is not intended to diagnose MRSA infection nor to guide or monitor treatment for MRSA infections. Performed at [facility], [address] [place], [place] [address]          Radiology Studies: No results found.      Scheduled Meds: . Chlorhexidine Gluconate Cloth  6 each Topical Daily  . feeding supplement (ENSURE ENLIVE)  237 mL Oral BID BM  . mouth rinse  15 mL Mouth Rinse BID   Continuous Infusions: . dextrose 1,000 mL (09/14/19 1254)     LOS: 5 days    Time spent: 35 mins.More than 50% of that time was spent in counseling and/or coordination of care.      Burnadette Pop, MD [facility] Pager [phone]  If 7PM-7AM, please contact night-coverage [email or web address] Password [email or web address] 09/14/2019, 1:39 PM

## 2019-09-14 NOTE — Consult Note (Signed)
Palliative care progress note  Reason for consult: Goals of care in light of encephalopathy following recent Covid infection complicated by hypernatremia and renal failure  Chart reviewed including personal review of pertinent labs and imaging.  Discussed case with Dr. Renford Dills.  Of note, palliative was consulted on Tracy [name] previously this admission but was then transition to comfort care and consult held per primary attending at that time.  Briefly, Tracy [name] is a 79 year old female with past medical history of dementia, CVA with right-sided deficits, hypertension, diabetes, Covid in November 20 with severe encephalopathy since that time who presented from SNF with less responsiveness.  She was obtunded and hypothermic with elevated creatinine and hypernatremia.  Transition was made to full comfort care on 09/11/2019.    Since that time, more family has become involved in her care and note per primary attending today that family is now requesting placement of a feeding tube.  Chart review reveals that staff had been working with her oldest daughter, Tracy [name], regarding goals of care in the past.  Looking back further, it appears that Tracy [name] is in the process of obtaining legal guardianship and social worker note on 09/08/2019 notes that she has initial interview on January 5 with representative from [facility] court house.  Second guardianship hearing is scheduled for February 23.  There is no paperwork regarding healthcare power of attorney in her chart.  My initial contact with family today was with her daughter, Tracy [name], at the bedside.  Tracy [name] then had the also call and speak with patient's son, Tracy [name], in conjunction with her via phone.  Both Tracy [name] and Tracy [name] indicate that they understand how sick Tracy [name] is.  Tracy [name] notes that she could die in the next day or 2.  At the same time, both Tracy [name] and Tracy [name] are concerned with the fact that she is not being offered nutrition nor but  she on hydration.  We had a long discussion regarding nutrition and hydration at the end of life and my concerns that feeding tube may cause more harm than benefit.  Discussed risk of bleeding, infection, aspiration, agitation requiring restraint, fluid overload, and increased incidence of bedsores with tube feedings.  Also discussed allowing her to have what she wants to eat and drink but with understanding that aspiration is real risk.  I was also is clear as possible but I do not think that she is going to maintain her nutrition or hydration by oral route.  -Following discussion, Tracy [name] and Tracy [name] are in agreement with plan to continue gentle hydration overnight, recheck labs in the morning, allow feeds as desired by Tracy [name] with understanding of aspiration risk and continue discussion regarding care plan tomorrow. -Called and discussed with her oldest daughter, Tracy [name], as well.  Tracy [name] reports being in agreement with plan, however, she is concerned that her family is not understanding just how close to death her mother is.  She reports just wanting her mother to have comfort and dignity for whatever time she has left. -Plan for follow-up tomorrow  Time in: 1610 Time out: 1730 Total time:80 minutes  Greater than 50%  of this time was spent counseling and coordinating care related to the above assessment and plan.  Tracy Minus, MD [facility] Palliative Medicine Team [phone]

## 2019-09-14 NOTE — Progress Notes (Signed)
Patient moaning when repositioned. Daughter in to visit. Patient refused to have oral care, will not open her mouth.

## 2019-09-14 NOTE — Progress Notes (Signed)
Daily Progress Note   Patient Name: Tracy [name]       Date: 09/14/2019 DOB: July 28, 1941  Age: 79 y.o. MRN#: [id number] Attending Physician: Burnadette Pop, MD Primary Care Physician: Tresa Garter, MD Admit Date: 09/09/2019  Reason for Consultation/Follow-up: Establishing goals of care  Subjective: I saw and examined Tracy [name] today.  She remains unable to participate in conversation.  Occasionally opens eyes and moans but no meaningful conversation with me in the room.  Discussed with her son, Tracy [name], and daughter, Tracy [name], in her room.  Also discussed with other daughter, Tracy [name], via phone.  I talked with Tracy [name] and Tracy [name] about lab work today as well as continued concern that she is not recovering to the point where she is going be able to maintain her nutrition and hydration.  Tracy [name] and Tracy [name] remain optimistic that she is going to recover and feel that any plan for comfort care is "throwing in the towel and giving up."  He asks again about feeding tube.  I gave him a copy of Hard Choices for Loving People to review.  I have asked Tracy [name] and Tracy [name] to discuss with remainder of their brothers and sisters to come to family consensus on what our care plan should be moving forward.  I discussed with them options of continuation of current interventions versus discontinuation of IV fluids and antibiotics while maintaining food for comfort with an overall goal of comfort care.  I also discussed with them that if we are transitioning away from plan for comfort care, we will need to revert to prior hospital visitation policy of only one visitor.  I then called and talked with her daughter, Tracy [name].  She continues to assert that she believes allowing her mother to pass with as much  comfort and dignity as possible should be primary goal.  Understands that her family disagrees with this and reports that she is going to try to "let it go and give it to God."  Length of Stay: 5  Current Medications: Scheduled Meds:  . Chlorhexidine Gluconate Cloth  6 each Topical Daily  . feeding supplement (ENSURE ENLIVE)  237 mL Oral BID BM  . mouth rinse  15 mL Mouth Rinse BID    Continuous Infusions: . cefTRIAXone (ROCEPHIN)  IV 1 g (09/14/19 1704)  . dextrose 1,000  mL (09/14/19 1254)    PRN Meds: acetaminophen **OR** acetaminophen, acetaminophen **OR** acetaminophen, antiseptic oral rinse, glycopyrrolate **OR** glycopyrrolate **OR** glycopyrrolate, haloperidol **OR** haloperidol **OR** haloperidol lactate, morphine injection, ondansetron **OR** ondansetron (ZOFRAN) IV, polyvinyl alcohol  Physical Exam  Frail, elderly appearing female in bed. Occasional mumbling but no meaningful conversation Diminished breath sounds S1, S2 Abdomen soft          Vital Signs: BP 118/62 (BP Location: Left Leg)   Pulse 92   Temp 97.7 F (36.5 C) (Oral)   Resp 16   Ht 5\' 2"  (1.575 m)   Wt 53.8 kg   SpO2 90%   BMI 21.69 kg/m  SpO2: SpO2: 90 % O2 Device: O2 Device: Room Air O2 Flow Rate: O2 Flow Rate (L/min): 2 L/min  Intake/output summary:   Intake/Output Summary (Last 24 hours) at 09/14/2019 2101 Last data filed at 09/14/2019 1900 Gross per 24 hour  Intake 974.2 ml  Output 1300 ml  Net -325.8 ml   LBM: Last BM Date: (PTA) Baseline Weight: Weight: 48.1 kg [name] recent weight: Weight: 53.8 kg       Palliative Assessment/Data:      Patient Active Problem List   Diagnosis Date Noted  . Sepsis, unspecified organism ([name]) 09/09/2019  . Hypernatremia 09/09/2019  . Chronic respiratory failure with hypoxia ([name]) 09/09/2019  . Acute renal failure (ARF) ([name]) 09/09/2019  . Acute metabolic encephalopathy 09/09/2019  . Coagulopathy ([name]) 09/09/2019  . Transaminitis 09/09/2019  .  Sepsis ([name]) 09/09/2019  . Pressure injury of skin 08/05/2019  . COVID-19 virus infection 07/30/2019  . Weakness 07/28/2019  . Hematochezia 07/31/2017  . Palliative care encounter 06/20/2017  . Abdominal pain 02/26/2017  . Insomnia 02/26/2017  . Cerumen impaction 12/04/2016  . Cervical disc disorder with radiculopathy of cervical region 09/22/2016  . Left rotator cuff tear arthropathy 09/22/2016  . Left shoulder pain 09/01/2016  . Left wrist pain 09/01/2016  . GERD (gastroesophageal reflux disease) 04/25/2016  . Onychomycosis 03/06/2016  . Viral illness 12/21/2015  . Blood in stool 11/30/2015  . Tension headache 10/08/2015  . Subdural hematoma ([name]) 02/05/2015  . Palpitations 01/18/2015  . HLD (hyperlipidemia) 01/12/2015  . Stroke ([name]) 01/12/2015  . Anemia, iron deficiency 12/24/2014  . Generalized anxiety disorder 11/27/2014  . Abscess of right buttock 11/04/2014  . Diabetes mellitus type 2 in obese ([name])   . SIRS (systemic inflammatory response syndrome) ([name]) 10/26/2014  . Umbilical hernia 02/03/2014  . Dementia with aggressive behavior ([name]) 02/03/2014  . Loss of weight 10/06/2013  . Constipation 10/06/2013  . HTN (hypertension) 08/10/2013  . History of CVA (cerebrovascular accident) 08/10/2013    Palliative Care Assessment & Plan   Patient Profile:   Recommendations/Plan:  At this point, we are waiting for family consensus on care plan moving forward.  Patient appears to me to be transitioning toward actively dying and I am concerned about her ability to recover regardless of interventions moving forward.  Discussed with family but they remain divided on plan moving forward.  She has 6 children but I have only been able to be in touch with three of them.  Of these three, two have favored continuation of fluids and antibiotics.  Will continue to progress conversation as possible.   Code Status:    Code Status Orders  (From admission, onward)         Start      Ordered   09/11/19 0839  Do not attempt resuscitation (DNR)  Continuous  Question Answer Comment  In the event of cardiac or respiratory ARREST Do not call a "code blue"   In the event of cardiac or respiratory ARREST Do not perform Intubation, CPR, defibrillation or ACLS   In the event of cardiac or respiratory ARREST Use medication by any route, position, wound care, and other measures to relive pain and suffering. May use oxygen, suction and manual treatment of airway obstruction as needed for comfort.   Comments Discussed with daughter Tracy [name] on 09/10/2019      09/11/19 0840        Code Status History    Date Active Date Inactive Code Status Order ID Comments User Context   09/10/2019 1149 09/11/2019 0840 DNR [id number]  [place] J, [place] Inpatient   09/09/2019 2027 09/10/2019 1149 Full Code [id number]  Edwin Dada, MD ED   07/28/2019 1750 08/09/2019 1540 Full Code [id number]  Truddie Hidden, MD ED   02/05/2015 1659 02/07/2015 1502 Full Code [id number]  Ashok Pall, MD ED   10/26/2014 2213 10/30/2014 1926 Full Code [id number]  Etta Quill, DO ED   08/20/2013 1444 08/24/2013 1437 Full Code [id number]  Armandina Gemma, MD Inpatient   08/19/2013 0029 08/20/2013 1444 Full Code [id number]  Rolm Bookbinder, MD Inpatient   08/10/2013 0836 08/14/2013 2051 Full Code [id number]  Orvan Falconer, MD Inpatient   Advance Care Planning Activity       Prognosis:   Hours - Days?  Discharge Planning:  To Be Determined  Care plan was discussed with Dr. Arvid Right and bedside RN  Thank you for allowing the Palliative Medicine Team to assist in the care of this patient.   Total Time 50 Prolonged Time Billed  No      Greater than 50%  of this time was spent counseling and coordinating care related to the above assessment and plan.  Micheline Rough, MD  Please contact Palliative Medicine Team phone at [phone] for questions and concerns.

## 2019-09-15 LAB — BASIC METABOLIC PANEL
Anion gap: 7 (ref 5–15)
BUN: 21 mg/dL (ref 8–23)
CO2: 25 mmol/L (ref 22–32)
Calcium: 7.9 mg/dL — ABNORMAL LOW (ref 8.9–10.3)
Chloride: 126 mmol/L — ABNORMAL HIGH (ref 98–111)
Creatinine, Ser: 0.8 mg/dL (ref 0.44–1.00)
GFR calc Af Amer: >60 mL/min (ref >60)
GFR calc non Af Amer: >60 mL/min (ref >60)
Glucose, Bld: 148 mg/dL — ABNORMAL HIGH (ref 70–99)
Potassium: 4.5 mmol/L (ref 3.5–5.1)
Sodium: 158 mmol/L — ABNORMAL HIGH (ref 135–145)

## 2019-09-15 NOTE — Progress Notes (Signed)
IV came out, IV team contacted. Daughter in visiting.

## 2019-09-15 NOTE — Progress Notes (Signed)
PROGRESS NOTE    Tracy [name]  BJY:[id number] DOB: 09/01/1941 DOA: 09/09/2019 PCP: Tresa Garter, MD   Brief Narrative: Patient is a 79 year old female with history of dementia, CVA with right-sided residual weakness, hypertension, diabetes, recent infection with Covid in November 2020 with severe encephalopathy who presents from skilled nursing facility with worsening mental status.  Patient was obtunded, altered on presentation.  Since her Covid infection, his mentation has been severely affected.  She became lethargic, hypotensive in the nursing home so she was sent to ER.  On presentation she was hypothermic, tachycardic, had AKI, sodium of 179.  Chest x-ray did not show pneumonia.  CT head/abdomen were unremarkable.  He was he was started on broad-spectrum antibiotics. Given her poor prognosis, multiple discussion was held with the family and her eldest daughter [name]  made her comfort care.  Currently the daughter has been on tremendous pressure from other family members regarding that decision.  Her other family members think that  she still has a chance to get better  and are interested on putting a feeding tube. We requested for palliative care consultation given this confusion.    09/14/2019: Palliative care closely following.  Her overall status is same as yesterday.  She is not in any acute distress.  She opens her eyes when calling her name and tries to speak.  We have started on IV fluids and antibiotics.  Lab work this morning showed improving hypernatremia.  On D5W.  She has been started on diet but she has not been eating.  Guardianship hearing scheduled for tomorrow.  Anticipate that the eldest daughter [name] will be the legal guardian and facilitate disposition/further plan of  comfort care.  Assessment & Plan:   Principal Problem:   Sepsis, unspecified organism ([name]) Active Problems:   HTN (hypertension)   History of CVA (cerebrovascular accident)   Dementia with  aggressive behavior ([name])   Diabetes mellitus type 2 in obese [facility])   Palliative care encounter   Hypernatremia   Chronic respiratory failure with hypoxia ([name])   Acute renal failure (ARF) ([name])   Acute metabolic encephalopathy   Coagulopathy ([name])   Transaminitis   Sepsis ([name])   Severe hypenatremia/dehydration/failure to thrive: Presented with worsening mental status, decreased responsiveness, hypotension, tachycardia.  Sodium was elevated to 178 on presentation.  Urine osmolality was 586, urine sodium was 14. Nephrology was consulted on presentation.  Nephrology has signed off because she was changed to comfort care.. No blood work has been obtained since 09/11/2019. Family requested for putting a feeding tube.  I talked to the decision-maker daughter [name] and she understands that feeding tube does not help.  I have not put an order for feeding tube.  We have started on gentle IV fluids  until we have full support from the family for making her comfort care.  Sepsis present on admission/UTI: Elevated white cell counts, lactic acid on admission.  Urine culture showed E. coli.  She was ceftriaxone which was stopped 3 days ago because she was made comfort care.  She just completed 2 days of antibiotics.  We added ceftriaxone because she is not formally in comfort care.  Hypertension: BP soft.  Continue IV fluids  AKI: Baseline creatinine is normal.  Presented with creatinine of 3.  Prerenal.  Continue gentle IV fluids.  Improving  Covid-19 encephalopathy/dementia: Not alert and oriented.  Continue to monitor mental status.  Also has toxic metabolic encephalopathy secondary to dehydration, UTI, AKI.  Patient also has  dementia at baseline.  She has history of CVA  Transaminitis: Secondary to underlying sepsis/dehydration/shock liver from hypotension.  Goals of care: As mentioned above, ongoing confusion and disagreement in the family regarding her care.  Eldest  daughter, Sunday Spillers, is very  understandable and she is interested on making her comfort care but she is not official power of attorney.  She is discussing with other family members for conclusive decision.  Dr. Domingo Cocking from palliative care closely following.  Continue comfort care measures like morphine, glycopyrrolate, anxiolytics for now.            DVT prophylaxis:None Code Status: DNR Family Communication: Sunday Spillers, the daughter on 09/13/19 Disposition Plan: Undetermined at this point   Consultants: Palliative care  Procedures: None  Antimicrobials:  Anti-infectives (From admission, onward)   Start     Dose/Rate Route Frequency Ordered Stop   09/14/19 1500  cefTRIAXone (ROCEPHIN) 1 g in sodium chloride 0.9 % 100 mL IVPB     1 g 200 mL/hr over 30 Minutes Intravenous Every 24 hours 09/14/19 1344     09/09/19 2200  cefTRIAXone (ROCEPHIN) 1 g in sodium chloride 0.9 % 100 mL IVPB  Status:  Discontinued     1 g 200 mL/hr over 30 Minutes Intravenous Every 24 hours 09/09/19 2027 09/11/19 0840   09/09/19 1000  metroNIDAZOLE (FLAGYL) IVPB 500 mg     500 mg 100 mL/hr over 60 Minutes Intravenous  Once 09/09/19 0957 09/09/19 1345   09/09/19 1000  ceFEPIme (MAXIPIME) 2 g in sodium chloride 0.9 % 100 mL IVPB     2 g 200 mL/hr over 30 Minutes Intravenous  Once 09/09/19 0959 09/09/19 1221   09/09/19 1000  vancomycin (VANCOCIN) IVPB 1000 mg/200 mL premix     1,000 mg 200 mL/hr over 60 Minutes Intravenous  Once 09/09/19 0959 09/09/19 1518         Objective: Vitals:   09/11/19 1210 09/13/19 0609 09/14/19 0543 09/15/19 0501  BP: (!) 100/59 (!) 96/53 118/62   Pulse: 85 89 92   Resp: 12 16  12   Temp: 98.5 F (36.9 C) 97.7 F (36.5 C)  97.7 F (36.5 C)  TempSrc: Axillary Oral  Oral  SpO2: 100% 100% 90%   Weight:      Height:        Intake/Output Summary (Last 24 hours) at 09/15/2019 1346 Last data filed at 09/15/2019 9629 Gross per 24 hour  Intake 310.27 ml  Output 675 ml  Net -364.73 ml   Filed Weights    09/09/19 2300 09/10/19 0457 09/11/19 0500  Weight: 48.1 kg 48.1 kg 53.8 kg    Examination:  General exam: Chronically ill looking, into in  distress HEENT: Ear/Nose normal on gross exam Respiratory system: Bilateral equal air entry,  no wheezes or crackles on anterior auscultation Cardiovascular system: S1 & S2 heard, RRR. No JVD, murmurs, rubs, gallops or clicks. No pedal edema. Gastrointestinal system: Abdomen is nondistended, soft and nontender. No organomegaly or masses felt. Normal bowel sounds heard. Central nervous system: Not Alert and oriented.  Extremities: No edema, no clubbing ,no cyanosis, Skin: No rashes, lesions or ulcers,no icterus ,no pallor   Data Reviewed: I have personally reviewed following labs and imaging studies  CBC: Recent Labs  Lab 09/09/19 0955 09/10/19 0153 09/11/19 0713 09/14/19 0828  WBC 15.0* 17.0* 10.4 11.6*  NEUTROABS 12.4*  --   --  8.9*  HGB 16.4* 12.8 11.4* 11.0*  HCT 55.6* 44.4 37.9 36.3  MCV 102.6* 103.5*  102.7* 100.8*  PLT 459* 321 249 205   Basic Metabolic Panel: Recent Labs  Lab 09/10/19 0153 09/10/19 1639 09/11/19 0713 09/14/19 0828 09/15/19 0451  NA 171* 164* 158* 164* 158*  K 4.3 3.8 3.2* 3.7 4.5  CL >130* >130* 123* 130* 126*  CO2 24 22 20* 25 25  GLUCOSE 265* 244* 269* 190* 148*  BUN 127* 101* 81* 30* 21  CREATININE 2.89* 2.26* 1.63* 0.89 0.80  CALCIUM 8.5* 8.3* 7.9* 8.4* 7.9*   GFR: Estimated Creatinine Clearance: 45.8 mL/min (by C-G formula based on SCr of 0.8 mg/dL). Liver Function Tests: Recent Labs  Lab 09/09/19 0955 09/09/19 1702 09/11/19 0713  AST 49* 38 22  ALT 98* 77* 47*  ALKPHOS 148* 118 88  BILITOT 1.2 1.1 0.6  PROT 8.7* 7.3 5.9*  ALBUMIN 4.3 3.6 3.0*   Recent Labs  Lab 09/09/19 0955  LIPASE 74*   No results for input(s): AMMONIA in the last 168 hours. Coagulation Profile: Recent Labs  Lab 09/09/19 0955  INR 1.4*   Cardiac Enzymes: No results for input(s): CKTOTAL, CKMB, CKMBINDEX,  TROPONINI in the last 168 hours. BNP (last 3 results) No results for input(s): PROBNP in the last 8760 hours. HbA1C: No results for input(s): HGBA1C in the last 72 hours. CBG: Recent Labs  Lab 09/10/19 1544 09/10/19 1914 09/10/19 2312 09/11/19 0341 09/11/19 0825  GLUCAP 157* 184* 210* 248* 235*   Lipid Profile: No results for input(s): CHOL, HDL, LDLCALC, TRIG, CHOLHDL, LDLDIRECT in the last 72 hours. Thyroid Function Tests: No results for input(s): TSH, T4TOTAL, FREET4, T3FREE, THYROIDAB in the last 72 hours. Anemia Panel: No results for input(s): VITAMINB12, FOLATE, FERRITIN, TIBC, IRON, RETICCTPCT in the last 72 hours. Sepsis Labs: Recent Labs  Lab 09/09/19 0955 09/09/19 1702 09/10/19 0726  PROCALCITON 0.29  --   --   LATICACIDVEN  --  4.1* 2.1*    Recent Results (from the past 240 hour(s))  Blood Culture (routine x 2)     Status: None   Collection Time: 09/09/19  9:52 AM   Specimen: BLOOD  Result Value Ref Range Status   Specimen Description   Final    BLOOD SITE NOT SPECIFIED Performed at [facility], [address] [place], [place] [address]    Special Requests   Final    BOTTLES DRAWN AEROBIC AND ANAEROBIC Blood Culture adequate volume Performed at [facility], [address] [place], [place] [address]    Culture   Final    NO GROWTH 5 DAYS Performed at [facility], [address] [place], [place] [address]    Report Status 09/14/2019 FINAL  Final  Blood Culture (routine x 2)     Status: None   Collection Time: 09/09/19  9:57 AM   Specimen: BLOOD  Result Value Ref Range Status   Specimen Description   Final    BLOOD BLOOD LEFT ARM Performed at [facility], [address] [place], [place] [address]    Special Requests   Final    BOTTLES DRAWN AEROBIC AND ANAEROBIC Blood Culture adequate volume Performed at [facility], [address] [place], [place] [address]     Culture   Final    NO GROWTH 5 DAYS Performed at [facility], [address] [place], [place] [address]    Report Status 09/14/2019 FINAL  Final  Urine culture     Status: Abnormal   Collection Time: 09/09/19  10:47 AM   Specimen: Urine, Random  Result Value Ref Range Status   Specimen Description   Final    URINE, RANDOM Performed at [facility], [address], [place], [place] [address]    Special Requests   Final    NONE Performed at [facility], [address], [place], [place] [address]    Culture >=100,000 COLONIES/mL ESCHERICHIA COLI (A)  Final   Report Status 09/11/2019 FINAL  Final   Organism ID, Bacteria ESCHERICHIA COLI (A)  Final      Susceptibility   Escherichia coli - MIC*    AMPICILLIN 8 SENSITIVE Sensitive     CEFAZOLIN <=4 SENSITIVE Sensitive     CEFTRIAXONE <=0.25 SENSITIVE Sensitive     CIPROFLOXACIN <=0.25 SENSITIVE Sensitive     GENTAMICIN <=1 SENSITIVE Sensitive     IMIPENEM <=0.25 SENSITIVE Sensitive     NITROFURANTOIN <=16 SENSITIVE Sensitive     TRIMETH/SULFA <=20 SENSITIVE Sensitive     AMPICILLIN/SULBACTAM 4 SENSITIVE Sensitive     PIP/TAZO <=4 SENSITIVE Sensitive     * >=100,000 COLONIES/mL ESCHERICHIA COLI  SARS CORONAVIRUS 2 (TAT 6-24 HRS) Nasopharyngeal Nasopharyngeal Swab     Status: None   Collection Time: 09/09/19 10:47 AM   Specimen: Nasopharyngeal Swab  Result Value Ref Range Status   SARS Coronavirus 2 NEGATIVE NEGATIVE Final    Comment: (NOTE) SARS-CoV-2 target nucleic acids are NOT DETECTED. The SARS-CoV-2 RNA is generally detectable in upper and lower respiratory specimens during the acute phase of infection. Negative results do not preclude SARS-CoV-2 infection, do not rule out co-infections with other pathogens, and should not be used as the sole basis for treatment or other patient management decisions. Negative results must be combined with clinical observations, patient  history, and epidemiological information. The expected result is Negative. Fact Sheet for Patients: HairSlick.no Fact Sheet for Healthcare Providers: quierodirigir.com This test is not yet approved or cleared by the [place] FDA and  has been authorized for detection and/or diagnosis of SARS-CoV-2 by FDA under an Emergency Use Authorization (EUA). This EUA will remain  in effect (meaning this test can be used) for the duration of the COVID-19 declaration under Section 56 4(b)(1) of the Act, 21 U.S.C. section 360bbb-3(b)(1), unless the authorization is terminated or revoked sooner. Performed at [facility], [address], [place], [place] [address]   MRSA PCR Screening     Status: None   Collection Time: 09/09/19 10:50 PM   Specimen: Nasal Mucosa; Nasopharyngeal  Result Value Ref Range Status   MRSA by PCR NEGATIVE NEGATIVE Final    Comment:        The GeneXpert MRSA Assay (FDA approved for NASAL specimens only), is one component of a comprehensive MRSA colonization surveillance program. It is not intended to diagnose MRSA infection nor to guide or monitor treatment for MRSA infections. Performed at [facility], [address], [place], [place] [address]          Radiology Studies: No results found.      Scheduled Meds:  Chlorhexidine Gluconate Cloth  6 each Topical Daily   feeding supplement (ENSURE ENLIVE)  237 mL Oral BID BM   mouth rinse  15 mL Mouth Rinse BID   Continuous Infusions:  cefTRIAXone (ROCEPHIN)  IV 1 g (09/14/19 1704)   dextrose 75 mL/hr at 09/15/19 0245     LOS: 6 days    Time spent: 35 mins.More than 50% of that time  was spent in counseling and/or coordination of care.      Burnadette Pop, MD [facility] Pager [phone]  If 7PM-7AM, please contact night-coverage [email or web address] Password [email or web address] 09/15/2019, 1:46 PM

## 2019-09-15 NOTE — Progress Notes (Signed)
Daily Progress Note   Patient Name: Tracy [name]       Date: 09/15/2019 DOB: 01-09-1941  Age: 79 y.o. MRN#: [id number] Attending Physician: Burnadette Pop, MD Primary Care Physician: Tresa Garter, MD Admit Date: 09/09/2019  Reason for Consultation/Follow-up: Establishing goals of care  Subjective: I saw and examined Tracy [name] today.  She remains unable to participate in conversation.  More somnolent today.  No family at bedside.  Discussed with other daughter, Tracy [name], via phone.  Reviewed clinical course including that Tracy [name] continues to be unable to take enough nutrition or hydration to maintain herself.  Discussed disagreement and family regarding best care plan moving forward.  Discussed surrogate decision making in the absence of named healthcare power of attorney.  Also discussed plan for hearing tomorrow for potential for guardianship.  Length of Stay: 6  Current Medications: Scheduled Meds:  . Chlorhexidine Gluconate Cloth  6 each Topical Daily  . feeding supplement (ENSURE ENLIVE)  237 mL Oral BID BM  . mouth rinse  15 mL Mouth Rinse BID    Continuous Infusions: . cefTRIAXone (ROCEPHIN)  IV 1 g (09/14/19 1704)  . dextrose 75 mL/hr at 09/15/19 0245    PRN Meds: acetaminophen **OR** acetaminophen, acetaminophen **OR** acetaminophen, antiseptic oral rinse, glycopyrrolate **OR** glycopyrrolate **OR** glycopyrrolate, haloperidol **OR** haloperidol **OR** haloperidol lactate, morphine injection, ondansetron **OR** ondansetron (ZOFRAN) IV, polyvinyl alcohol  Physical Exam  Frail, elderly appearing female in bed. Occasional mumbling but no meaningful conversation Diminished breath sounds S1, S2 Abdomen soft          Vital Signs: BP 118/62 (BP Location: Left  Leg)   Pulse 92   Temp 97.7 F (36.5 C) (Oral)   Resp 12   Ht 5\' 2"  (1.575 m)   Wt 53.8 kg   SpO2 90%   BMI 21.69 kg/m  SpO2: SpO2: 90 % O2 Device: O2 Device: Room Air O2 Flow Rate: O2 Flow Rate (L/min): 2 L/min  Intake/output summary:   Intake/Output Summary (Last 24 hours) at 09/15/2019 1157 Last data filed at 09/15/2019 0852 Gross per 24 hour  Intake 310.27 ml  Output 675 ml  Net -364.73 ml   LBM: Last BM Date: (PTA) Baseline Weight: Weight: 48.1 kg Most recent weight: Weight: 53.8 kg       Palliative Assessment/Data:  Patient Active Problem List   Diagnosis Date Noted  . Sepsis, unspecified organism ([place]) 09/09/2019  . Hypernatremia 09/09/2019  . Chronic respiratory failure with hypoxia ([place]) 09/09/2019  . Acute renal failure (ARF) ([place]) 09/09/2019  . Acute metabolic encephalopathy 16/06/9603  . Coagulopathy ([place]) 09/09/2019  . Transaminitis 09/09/2019  . Sepsis ([place]) 09/09/2019  . Pressure injury of skin 08/05/2019  . COVID-19 virus infection 07/30/2019  . Weakness 07/28/2019  . Hematochezia 07/31/2017  . Palliative care encounter 06/20/2017  . Abdominal pain 02/26/2017  . Insomnia 02/26/2017  . Cerumen impaction 12/04/2016  . Cervical disc disorder with radiculopathy of cervical region 09/22/2016  . Left rotator cuff tear arthropathy 09/22/2016  . Left shoulder pain 09/01/2016  . Left wrist pain 09/01/2016  . GERD (gastroesophageal reflux disease) 04/25/2016  . Onychomycosis 03/06/2016  . Viral illness 12/21/2015  . Blood in stool 11/30/2015  . Tension headache 10/08/2015  . Subdural hematoma ([place]) 02/05/2015  . Palpitations 01/18/2015  . HLD (hyperlipidemia) 01/12/2015  . Stroke ([place]) 01/12/2015  . Anemia, iron deficiency 12/24/2014  . Generalized anxiety disorder 11/27/2014  . Abscess of right buttock 11/04/2014  . Diabetes mellitus type 2 in obese ([place])   . SIRS (systemic inflammatory response syndrome) ([place]) 10/26/2014  . Umbilical  hernia 54/05/8118  . Dementia with aggressive behavior ([place]) 02/03/2014  . Loss of weight 10/06/2013  . Constipation 10/06/2013  . HTN (hypertension) 08/10/2013  . History of CVA (cerebrovascular accident) 08/10/2013    Palliative Care Assessment & Plan   Patient Profile: Tracy [name] is a 79 year old female with past medical history of dementia, CVA with right-sided deficits, hypertension, diabetes, Covid in November 20 with severe encephalopathy since that time who presented from SNF with less responsiveness.  She was obtunded and hypothermic with elevated creatinine and hypernatremia.  Transition was made to full comfort care on 09/11/2019 but some of the other children have disagree with that and she is back receiving IV hydration as well as antibiotics.     Recommendations/Plan:  Patient appears to me to be transitioning toward actively dying and I am concerned about her ability to recover regardless of interventions moving forward.  I have been in discussion with family over the past couple of days but they remain divided on plan moving forward.  She has 6 children but I have only been able to be in touch with three of them.  Of these three, two have favored continuation of fluids and antibiotics.  Will continue to progress conversation as possible.  There is a guardianship hearing scheduled for tomorrow at 9 AM.  Please see LCSW note from 12/28.  I confirmed with daughter Tracy [name] that this is still set to occur.  At this point, my recommendation is to await to see if guardianship is appointed to a member of the family who can then help [place] decide best course of action moving forward.   Code Status:    Code Status Orders  (From admission, onward)         Start     Ordered   09/11/19 0839  Do not attempt resuscitation (DNR)  Continuous    Question Answer Comment  In the event of cardiac or respiratory ARREST Do not call a "code blue"   In the event of cardiac or respiratory ARREST  Do not perform Intubation, CPR, defibrillation or ACLS   In the event of cardiac or respiratory ARREST Use medication by any route, position, wound care, and other measures to relive pain  and suffering. May use oxygen, suction and manual treatment of airway obstruction as needed for comfort.   Comments Discussed with daughter Tracy [name] on 09/10/2019      09/11/19 0840        Code Status History    Date Active Date Inactive Code Status Order ID Comments User Context   09/10/2019 1149 09/11/2019 0840 DNR [id number]  [place], Eric J, [place] Inpatient   09/09/2019 2027 09/10/2019 1149 Full Code [id number]  Alberteen Sam, MD ED   07/28/2019 1750 08/09/2019 1540 Full Code [id number]  Clydia Llano, MD ED   02/05/2015 1659 02/07/2015 1502 Full Code [id number]  Coletta Memos, MD ED   10/26/2014 2213 10/30/2014 1926 Full Code [id number]  Hillary Bow, DO ED   08/20/2013 1444 08/24/2013 1437 Full Code [id number]  Darnell Level, MD Inpatient   08/19/2013 0029 08/20/2013 1444 Full Code [id number]  Emelia Loron, MD Inpatient   08/10/2013 0836 08/14/2013 2051 Full Code [id number]  Houston Siren, MD Inpatient   Advance Care Planning Activity       Prognosis:   Hours - Days?  Discharge Planning:  To Be Determined  Care plan was discussed with Dr. Anthoney Harada and bedside RN  Thank you for allowing the Palliative Medicine Team to assist in the care of this patient.   Total Time 50 Prolonged Time Billed  No      Greater than 50%  of this time was spent counseling and coordinating care related to the above assessment and plan.  Romie Minus, MD  Please contact Palliative Medicine Team phone at [phone] for questions and concerns.

## 2019-09-15 NOTE — Care Management Important Message (Signed)
Important Message  Patient Details IM Letter given to [name] RN Case Manager to present to the Patient Name: Tracy [name] MRN: [id number] Date of Birth: 03-12-1941   Medicare Important Message Given:  Yes     Caren Macadam 09/15/2019, 11:08 AM

## 2019-09-16 LAB — BASIC METABOLIC PANEL
Anion gap: 7 (ref 5–15)
BUN: 17 mg/dL (ref 8–23)
CO2: 24 mmol/L (ref 22–32)
Calcium: 8 mg/dL — ABNORMAL LOW (ref 8.9–10.3)
Chloride: 120 mmol/L — ABNORMAL HIGH (ref 98–111)
Creatinine, Ser: 0.78 mg/dL (ref 0.44–1.00)
GFR calc Af Amer: >60 mL/min (ref >60)
GFR calc non Af Amer: >60 mL/min (ref >60)
Glucose, Bld: 111 mg/dL — ABNORMAL HIGH (ref 70–99)
Potassium: 4.4 mmol/L (ref 3.5–5.1)
Sodium: 151 mmol/L — ABNORMAL HIGH (ref 135–145)

## 2019-09-16 NOTE — Consult Note (Signed)
   [facility] CM Inpatient Consult   09/16/2019  Tracy [name] 1940-11-27 [id number]    HTA - CSNP (Chronic Special Needs Program):   Patient is currently active with [name]) Care Management for chronic disease management services. Patient has been engaged by a [name] (who is aware of this admission) for DM.Our community based plan of care has focused on disease management and community resource support.  Patient will receive a post hospital call and will be evaluated for assessments and disease process education.  Per chart review, patient presented from her nursing facility [name]) with decreased responsiveness and was admitted  for severe hypernatremia/ dehydration /failure to thrive, Sepsis present on admission/UTI.  Per MD note today, Palliative care closely following and waiting to hear more from the family for disposition plan/ further plan of comfort care. Candidate for hospice.  Will update [name] Care Management coordinator of disposition decision.  Of note, [name] Care Management services does not replace or interfere with any services that are needed or arranged by inpatient case management or social work.   For questions and additional information, please contact:  Jahad Old A. Laine Fonner, BSN, RN-BC [facility] Liaison Cell: [phone]

## 2019-09-16 NOTE — Plan of Care (Signed)
Problem: Clinical Measurements: Goal: Will remain free from infection 09/16/2019 1559 by Hewitt Blade, Chirstopher Iovino D, RN Outcome: Progressing 09/16/2019 1558 by Hewitt Blade, Esra Frankowski D, RN Outcome: Not Progressing Goal: Respiratory complications will improve 09/16/2019 1559 by Hewitt Blade, Benjerman Molinelli D, RN Outcome: Progressing 09/16/2019 1558 by Hewitt Blade, Rosaland Shiffman D, RN Outcome: Not Progressing Goal: Cardiovascular complication will be avoided 09/16/2019 1559 by Hewitt Blade, Arnold Kester D, RN Outcome: Progressing 09/16/2019 1558 by Hewitt Blade, Dina Warbington D, RN Outcome: Not Progressing   Problem: Coping: Goal: Level of anxiety will decrease 09/16/2019 1559 by Hewitt Blade, Kenyotta Dorfman D, RN Outcome: Progressing 09/16/2019 1558 by Hewitt Blade, Kirstine Jacquin D, RN Outcome: Not Progressing   Problem: Pain Managment: Goal: General experience of comfort will improve 09/16/2019 1559 by Hewitt Blade, Erica Richwine D, RN Outcome: Progressing 09/16/2019 1558 by Hewitt Blade, Toma Arts D, RN Outcome: Progressing   Problem: Safety: Goal: Ability to remain free from injury will improve 09/16/2019 1559 by Hewitt Blade, Orilla Templeman D, RN Outcome: Progressing 09/16/2019 1558 by Hewitt Blade, Bluford Sedler D, RN Outcome: Not Progressing   Problem: Fluid Volume: Goal: Hemodynamic stability will improve 09/16/2019 1559 by Hewitt Blade, Kainat Pizana D, RN Outcome: Not Progressing 09/16/2019 1558 by Hewitt Blade, Cranston Koors D, RN Outcome: Not Progressing   Problem: Clinical Measurements: Goal: Diagnostic test results will improve 09/16/2019 1559 by Hewitt Blade, Kinsey Cowsert D, RN Outcome: Not Progressing 09/16/2019 1558 by Hewitt Blade, Charlee Whitebread D, RN Outcome: Not Progressing Goal: Signs and symptoms of infection will decrease 09/16/2019 1559 by Hewitt Blade, Brianda Beitler D, RN Outcome: Not Progressing 09/16/2019 1558 by Hewitt Blade, Ananiah Maciolek D, RN Outcome: Not Progressing   Problem: Respiratory: Goal: Ability  to maintain adequate ventilation will improve 09/16/2019 1559 by Hewitt Blade, Lenix Kidd D, RN Outcome: Not Progressing 09/16/2019 1558 by Hewitt Blade, Arie Powell D, RN Outcome: Not Progressing   Problem: Education: Goal: Knowledge of General Education information will improve Description: Including pain rating scale, medication(s)/side effects and non-pharmacologic comfort measures 09/16/2019 1559 by Hewitt Blade, Onalee Steinbach D, RN Outcome: Not Progressing 09/16/2019 1558 by Hewitt Blade, Shantavia Jha D, RN Outcome: Not Progressing   Problem: Health Behavior/Discharge Planning: Goal: Ability to manage health-related needs will improve 09/16/2019 1559 by Hewitt Blade, Lleyton Byers D, RN Outcome: Not Progressing 09/16/2019 1558 by Hewitt Blade, Felina Tello D, RN Outcome: Not Progressing   Problem: Clinical Measurements: Goal: Ability to maintain clinical measurements within normal limits will improve 09/16/2019 1559 by Hewitt Blade, Zyia Kaneko D, RN Outcome: Not Progressing 09/16/2019 1558 by Hewitt Blade, Guneet Delpino D, RN Outcome: Not Progressing Goal: Diagnostic test results will improve 09/16/2019 1559 by Hewitt Blade, Jaedin Trumbo D, RN Outcome: Not Progressing 09/16/2019 1558 by Hewitt Blade, Derrious Bologna D, RN Outcome: Not Progressing   Problem: Activity: Goal: Risk for activity intolerance will decrease 09/16/2019 1559 by Hewitt Blade, Chozen Latulippe D, RN Outcome: Not Progressing 09/16/2019 1558 by Hewitt Blade, Tamara Monteith D, RN Outcome: Not Progressing   Problem: Nutrition: Goal: Adequate nutrition will be maintained 09/16/2019 1559 by Hewitt Blade, Issiac Jamar D, RN Outcome: Not Progressing 09/16/2019 1558 by Hewitt Blade, Brenetta Penny D, RN Outcome: Not Progressing   Problem: Elimination: Goal: Will not experience complications related to bowel motility 09/16/2019 1559 by Hewitt Blade, Breyson Kelm D, RN Outcome: Not Progressing 09/16/2019 1558 by Hewitt Blade, Nobel Brar D, RN Outcome: Not  Progressing Goal: Will not experience complications related to urinary retention 09/16/2019 1559 by Hewitt Blade, Wlliam Grosso D, RN Outcome: Not Progressing 09/16/2019 1558 by Hewitt Blade, Dustan Hyams D, RN Outcome: Not Progressing   Problem: Skin Integrity: Goal: Risk for impaired skin integrity  will decrease 09/16/2019 1559 by Mar Daring, Jentry Warnell D, RN Outcome: Not Progressing 09/16/2019 1558 by Mar Daring, Lamont Tant D, RN Outcome: Not Progressing

## 2019-09-16 NOTE — Progress Notes (Signed)
PROGRESS NOTE    Tracy [name]  JGO:[id number] DOB: 1940-12-29 DOA: 09/09/2019 PCP: Tresa Garter, MD   Brief Narrative: Patient is a 79 year old female with history of dementia, CVA with right-sided residual weakness, hypertension, diabetes, recent infection with Covid in November 2020 with severe encephalopathy who presents from skilled nursing facility with worsening mental status.  Patient was obtunded, altered on presentation.  Since her Covid infection, his mentation has been severely affected.  She became lethargic, hypotensive in the nursing home so she was sent to ER.  On presentation she was hypothermic, tachycardic, had AKI, sodium of 179.  Chest x-ray did not show pneumonia.  CT head/abdomen were unremarkable.  He was he was started on broad-spectrum antibiotics. Given her poor prognosis, multiple discussion was held with the family and her eldest daughter [name]  made her comfort care.  Currently the daughter has been on tremendous pressure from other family members regarding that decision.  Her other family members think that  she still has a chance to get better  and are interested on putting a feeding tube. We requested for palliative care consultation given this confusion.    09/16/2019: Patient looks more comfortable today.  More alert and awake.  Tries to communicate.  Not in any kind of distress.  Still looks very weak, confused. Waiting to hear more from the family.  Palliative care closely following.  Anticipate that the eldest daughter [name] will be the legal guardian and facilitate disposition/further plan of  comfort care.  Assessment & Plan:   Principal Problem:   Sepsis, unspecified organism ([name]) Active Problems:   HTN (hypertension)   History of CVA (cerebrovascular accident)   Dementia with aggressive behavior ([name])   Diabetes mellitus type 2 in obese [facility])   Palliative care encounter   Hypernatremia   Chronic respiratory failure with hypoxia ([name])  Acute renal failure (ARF) ([name])   Acute metabolic encephalopathy   Coagulopathy ([name])   Transaminitis   Sepsis ([name])   Severe hypenatremia/dehydration/failure to thrive: Presented with worsening mental status, decreased responsiveness, hypotension, tachycardia.  Sodium was elevated to 178 on presentation.  Urine osmolality was 586, urine sodium was 14. Nephrology was consulted on presentation.  Nephrology has signed off because she was changed to comfort care. Family requested for putting a feeding tube.  I talked to the decision-maker daughter [name] and she understands that feeding tube does not help.  I have not put an order for feeding tube.  We have started on gentle D5  IV fluids  until we have full support from the family for making her comfort care.  Sepsis present on admission/UTI: Elevated white cell counts, lactic acid on admission.  Urine culture showed E. coli.  She was ceftriaxone which was stopped 3 days ago because she was made comfort care.  She just completed 2 days of antibiotics.  We restarted  ceftriaxone because she is not formally in comfort care.  Hypertension: BP stable  AKI: Resolved with IV fluids.  Covid-19 encephalopathy/dementia: Not  oriented.  Continue to monitor mental status.  Also has toxic metabolic encephalopathy secondary to dehydration, UTI, AKI.  Patient also has dementia at baseline.  She has history of CVA  Transaminitis: Secondary to underlying sepsis/dehydration/shock liver from hypotension.  Goals of care: As mentioned above, ongoing confusion and disagreement in the family regarding her care.  Eldest  daughter, [name], is very understandable and she is interested on making her comfort care but she is not official power of attorney.  She is discussing with other family members for conclusive decision.  Dr. Domingo Cocking from palliative care closely following.  Continue comfort care measures like morphine, glycopyrrolate, anxiolytics for now.             DVT prophylaxis:None Code Status: DNR Family Communication: Sunday Spillers, the daughter on 09/13/19 Disposition Plan: Undetermined at this point.  Waiting to hear from family .  Candidate for hospice.   Consultants: Palliative care  Procedures: None  Antimicrobials:  Anti-infectives (From admission, onward)   Start     Dose/Rate Route Frequency Ordered Stop   09/14/19 1500  cefTRIAXone (ROCEPHIN) 1 g in sodium chloride 0.9 % 100 mL IVPB     1 g 200 mL/hr over 30 Minutes Intravenous Every 24 hours 09/14/19 1344     09/09/19 2200  cefTRIAXone (ROCEPHIN) 1 g in sodium chloride 0.9 % 100 mL IVPB  Status:  Discontinued     1 g 200 mL/hr over 30 Minutes Intravenous Every 24 hours 09/09/19 2027 09/11/19 0840   09/09/19 1000  metroNIDAZOLE (FLAGYL) IVPB 500 mg     500 mg 100 mL/hr over 60 Minutes Intravenous  Once 09/09/19 0957 09/09/19 1345   09/09/19 1000  ceFEPIme (MAXIPIME) 2 g in sodium chloride 0.9 % 100 mL IVPB     2 g 200 mL/hr over 30 Minutes Intravenous  Once 09/09/19 0959 09/09/19 1221   09/09/19 1000  vancomycin (VANCOCIN) IVPB 1000 mg/200 mL premix     1,000 mg 200 mL/hr over 60 Minutes Intravenous  Once 09/09/19 0959 09/09/19 1518         Objective: Vitals:   09/13/19 0609 09/14/19 0543 09/15/19 0501 09/16/19 0529  BP: (!) 96/53 118/62  133/75  Pulse: 89 92  98  Resp: 16  12 16   Temp: 97.7 F (36.5 C)  97.7 F (36.5 C) 98.2 F (36.8 C)  TempSrc: Oral  Oral Oral  SpO2: 100% 90%  (!) 57%  Weight:      Height:        Intake/Output Summary (Last 24 hours) at 09/16/2019 1315 Last data filed at 09/16/2019 0534 Gross per 24 hour  Intake 2141.46 ml  Output 400 ml  Net 1741.46 ml   Filed Weights   09/09/19 2300 09/10/19 0457 09/11/19 0500  Weight: 48.1 kg 48.1 kg 53.8 kg    Examination:  General exam: Chronically ill looking, not in   Distress, weak, debilitated HEENT: Ear/Nose normal on gross exam Respiratory system: Bilateral equal air entry,  no wheezes or  crackles  Cardiovascular system: S1 & S2 heard, RRR. No JVD, murmurs, rubs, gallops or clicks. No pedal edema. Gastrointestinal system: Abdomen is nondistended, soft and nontender. No organomegaly or masses felt. Normal bowel sounds heard. Central nervous system: awake bot not that Alert,not oriented.  Extremities: No edema, no clubbing ,no cyanosis, Skin: No rashes, lesions or ulcers,no icterus ,no pallor   Data Reviewed: I have personally reviewed following labs and imaging studies  CBC: Recent Labs  Lab 09/10/19 0153 09/11/19 0713 09/14/19 0828  WBC 17.0* 10.4 11.6*  NEUTROABS  --   --  8.9*  HGB 12.8 11.4* 11.0*  HCT 44.4 37.9 36.3  MCV 103.5* 102.7* 100.8*  PLT 321 249 716   Basic Metabolic Panel: Recent Labs  Lab 09/10/19 1639 09/11/19 0713 09/14/19 0828 09/15/19 0451 09/16/19 0438  NA 164* 158* 164* 158* 151*  K 3.8 3.2* 3.7 4.5 4.4  CL >130* 123* 130* 126* 120*  CO2 22 20* 25 25 24  GLUCOSE 244* 269* 190* 148* 111*  BUN 101* 81* 30* 21 17  CREATININE 2.26* 1.63* 0.89 0.80 0.78  CALCIUM 8.3* 7.9* 8.4* 7.9* 8.0*   GFR: Estimated Creatinine Clearance: 45.8 mL/min (by C-G formula based on SCr of 0.78 mg/dL). Liver Function Tests: Recent Labs  Lab 09/09/19 1702 09/11/19 0713  AST 38 22  ALT 77* 47*  ALKPHOS 118 88  BILITOT 1.1 0.6  PROT 7.3 5.9*  ALBUMIN 3.6 3.0*   No results for input(s): LIPASE, AMYLASE in the last 168 hours. No results for input(s): AMMONIA in the last 168 hours. Coagulation Profile: No results for input(s): INR, PROTIME in the last 168 hours. Cardiac Enzymes: No results for input(s): CKTOTAL, CKMB, CKMBINDEX, TROPONINI in the last 168 hours. BNP (last 3 results) No results for input(s): PROBNP in the last 8760 hours. HbA1C: No results for input(s): HGBA1C in the last 72 hours. CBG: Recent Labs  Lab 09/10/19 1544 09/10/19 1914 09/10/19 2312 09/11/19 0341 09/11/19 0825  GLUCAP 157* 184* 210* 248* 235*   Lipid Profile: No  results for input(s): CHOL, HDL, LDLCALC, TRIG, CHOLHDL, LDLDIRECT in the last 72 hours. Thyroid Function Tests: No results for input(s): TSH, T4TOTAL, FREET4, T3FREE, THYROIDAB in the last 72 hours. Anemia Panel: No results for input(s): VITAMINB12, FOLATE, FERRITIN, TIBC, IRON, RETICCTPCT in the last 72 hours. Sepsis Labs: Recent Labs  Lab 09/09/19 1702 09/10/19 0726  LATICACIDVEN 4.1* 2.1*    Recent Results (from the past 240 hour(s))  Blood Culture (routine x 2)     Status: None   Collection Time: 09/09/19  9:52 AM   Specimen: BLOOD  Result Value Ref Range Status   Specimen Description   Final    BLOOD SITE NOT SPECIFIED Performed at [facility], [address] [place], [place] [address]    Special Requests   Final    BOTTLES DRAWN AEROBIC AND ANAEROBIC Blood Culture adequate volume Performed at [facility], [address], [place], [place] [address]    Culture   Final    NO GROWTH 5 DAYS Performed at [facility], [address] [place], [place] [address]    Report Status 09/14/2019 FINAL  Final  Blood Culture (routine x 2)     Status: None   Collection Time: 09/09/19  9:57 AM   Specimen: BLOOD  Result Value Ref Range Status   Specimen Description   Final    BLOOD BLOOD LEFT ARM Performed at [facility], [address] [place], [place] [address]    Special Requests   Final    BOTTLES DRAWN AEROBIC AND ANAEROBIC Blood Culture adequate volume Performed at [facility], [address] [place], [place] [address]    Culture   Final    NO GROWTH 5 DAYS Performed at [facility], [address], [place], [place] [address]    Report Status 09/14/2019 FINAL  Final  Urine culture     Status: Abnormal   Collection Time: 09/09/19 10:47 AM   Specimen: Urine, Random  Result Value Ref Range Status   Specimen Description   Final    URINE, RANDOM Performed at [facility], [address] [place], [place] [address]    Special Requests   Final    NONE Performed at [facility], [address], [place], [place] [address]    Culture >=100,000 COLONIES/mL ESCHERICHIA COLI (A)  Final   Report Status  09/11/2019 FINAL  Final   Organism ID, Bacteria ESCHERICHIA COLI (A)  Final      Susceptibility   Escherichia coli - MIC*    AMPICILLIN 8 SENSITIVE Sensitive     CEFAZOLIN <=4 SENSITIVE Sensitive     CEFTRIAXONE <=0.25 SENSITIVE Sensitive     CIPROFLOXACIN <=0.25 SENSITIVE Sensitive     GENTAMICIN <=1 SENSITIVE Sensitive     IMIPENEM <=0.25 SENSITIVE Sensitive     NITROFURANTOIN <=16 SENSITIVE Sensitive     TRIMETH/SULFA <=20 SENSITIVE Sensitive     AMPICILLIN/SULBACTAM 4 SENSITIVE Sensitive     PIP/TAZO <=4 SENSITIVE Sensitive     * >=100,000 COLONIES/mL ESCHERICHIA COLI  SARS CORONAVIRUS 2 (TAT 6-24 HRS) Nasopharyngeal Nasopharyngeal Swab     Status: None   Collection Time: 09/09/19 10:47 AM   Specimen: Nasopharyngeal Swab  Result Value Ref Range Status   SARS Coronavirus 2 NEGATIVE NEGATIVE Final    Comment: (NOTE) SARS-CoV-2 target nucleic acids are NOT DETECTED. The SARS-CoV-2 RNA is generally detectable in upper and lower respiratory specimens during the acute phase of infection. Negative results do not preclude SARS-CoV-2 infection, do not rule out co-infections with other pathogens, and should not be used as the sole basis for treatment or other patient management decisions. Negative results must be combined with clinical observations, patient history, and epidemiological information. The expected result is Negative. Fact Sheet for Patients: HairSlick.no Fact Sheet for Healthcare Providers: quierodirigir.com This test is not yet approved or cleared by the [place] FDA and  has been authorized for detection and/or diagnosis of SARS-CoV-2 by FDA under an  Emergency Use Authorization (EUA). This EUA will remain  in effect (meaning this test can be used) for the duration of the COVID-19 declaration under Section 56 4(b)(1) of the Act, 21 U.S.C. section 360bbb-3(b)(1), unless the authorization is terminated or revoked sooner. Performed at [facility], [address] [place], [place] [address]   MRSA PCR Screening     Status: None   Collection Time: 09/09/19 10:50 PM   Specimen: Nasal Mucosa; Nasopharyngeal  Result Value Ref Range Status   MRSA by PCR NEGATIVE NEGATIVE Final    Comment:        The GeneXpert MRSA Assay (FDA approved for NASAL specimens only), is one component of a comprehensive MRSA colonization surveillance program. It is not intended to diagnose MRSA infection nor to guide or monitor treatment for MRSA infections. Performed at [facility], [address], [place], [place] [address]          Radiology Studies: No results found.      Scheduled Meds: . Chlorhexidine Gluconate Cloth  6 each Topical Daily  . feeding supplement (ENSURE ENLIVE)  237 mL Oral BID BM  . mouth rinse  15 mL Mouth Rinse BID   Continuous Infusions: . cefTRIAXone (ROCEPHIN)  IV 1 g (09/15/19 1539)  . dextrose 75 mL/hr at 09/15/19 2007     LOS: 7 days    Time spent: 35 mins.More than 50% of that time was spent in counseling and/or coordination of care.      Burnadette Pop, MD [facility] Pager [phone]  If 7PM-7AM, please contact night-coverage [email or web address] Password [email or web address] 09/16/2019, 1:15 PM

## 2019-09-17 LAB — BASIC METABOLIC PANEL
Anion gap: 9 (ref 5–15)
BUN: 14 mg/dL (ref 8–23)
CO2: 24 mmol/L (ref 22–32)
Calcium: 7.7 mg/dL — ABNORMAL LOW (ref 8.9–10.3)
Chloride: 113 mmol/L — ABNORMAL HIGH (ref 98–111)
Creatinine, Ser: 0.73 mg/dL (ref 0.44–1.00)
GFR calc Af Amer: >60 mL/min (ref >60)
GFR calc non Af Amer: >60 mL/min (ref >60)
Glucose, Bld: 129 mg/dL — ABNORMAL HIGH (ref 70–99)
Potassium: 4.1 mmol/L (ref 3.5–5.1)
Sodium: 146 mmol/L — ABNORMAL HIGH (ref 135–145)

## 2019-09-17 LAB — CBC WITH DIFFERENTIAL/PLATELET
Abs Immature Granulocytes: 0.19 10*3/uL — ABNORMAL HIGH (ref 0.00–0.07)
Basophils Absolute: 0.1 10*3/uL (ref 0.0–0.1)
Basophils Relative: 1 %
Eosinophils Absolute: 0.5 10*3/uL (ref 0.0–0.5)
Eosinophils Relative: 5 %
HCT: 32 % — ABNORMAL LOW (ref 36.0–46.0)
Hemoglobin: 10.7 g/dL — ABNORMAL LOW (ref 12.0–15.0)
Immature Granulocytes: 2 %
Lymphocytes Relative: 23 %
Lymphs Abs: 2.1 10*3/uL (ref 0.7–4.0)
MCH: 30.1 pg (ref 26.0–34.0)
MCHC: 33.4 g/dL (ref 30.0–36.0)
MCV: 90.1 fL (ref 80.0–100.0)
Monocytes Absolute: 0.7 10*3/uL (ref 0.1–1.0)
Monocytes Relative: 8 %
Neutro Abs: 5.8 10*3/uL (ref 1.7–7.7)
Neutrophils Relative %: 61 %
Platelets: UNDETERMINED 10*3/uL (ref 150–400)
RBC: 3.55 MIL/uL — ABNORMAL LOW (ref 3.87–5.11)
RDW: 16.9 % — ABNORMAL HIGH (ref 11.5–15.5)
WBC: 9.3 10*3/uL (ref 4.0–10.5)
nRBC: 0.3 % — ABNORMAL HIGH (ref 0.0–0.2)

## 2019-09-17 NOTE — Progress Notes (Signed)
PROGRESS NOTE    Tracy [name]  SEG:[id number] DOB: 1941-04-02 DOA: 09/09/2019 PCP: Cassandria Anger, MD   Brief Narrative: Patient is a 79 year old female with history of dementia, CVA with right-sided residual weakness, hypertension, diabetes, recent infection with Covid in November 2020 with severe encephalopathy who presents from skilled nursing facility with worsening mental status.  Patient was obtunded, altered on presentation.  Since her Covid infection, his mentation has been severely affected.  She became lethargic, hypotensive in the nursing home so she was sent to ER.  On presentation she was hypothermic, tachycardic, had AKI, sodium of 179.  Chest x-ray did not show pneumonia.  CT head/abdomen were unremarkable.  He was he was started on broad-spectrum antibiotics. Given her poor prognosis, multiple discussion was held with the family and her eldest daughter Sunday Spillers  made her comfort care.  Currently the daughter has been on tremendous pressure from other family members regarding that decision.  Her other family members think that  she still has a chance to get better  and are interested on putting a feeding tube. We requested for palliative care consultation given this confusion.    09/17/2019: Mental status, Similac yesterday.  Awake but not alert or oriented.  Opens her eyes.  Tries to speak on calling her name.  Not in distress. Palliative care following with family.  As per the report, her eldest daughter Sunday Spillers has withdrawn from process of guardianship.  She is discussing with her other family members about her mother's further care. Plan for discharge to [facility] with palliative versus transitioning her care to comfort pending family's decision.  Assessment & Plan:   Principal Problem:   Sepsis, unspecified organism ([place]) Active Problems:   HTN (hypertension)   History of CVA (cerebrovascular accident)   Dementia with aggressive behavior ([place])   Diabetes mellitus  type 2 in obese [facility])   Palliative care encounter   Hypernatremia   Chronic respiratory failure with hypoxia ([place])   Acute renal failure (ARF) ([name])   Acute metabolic encephalopathy   Coagulopathy ([name])   Transaminitis   Sepsis ([place])   Severe hypenatremia/dehydration/failure to thrive: Presented with worsening mental status, decreased responsiveness, hypotension, tachycardia.  Sodium was elevated to 178 on presentation.  Urine osmolality was 586, urine sodium was 14. Nephrology was consulted on presentation.  Nephrology has signed off because she was changed to comfort care. Family requested for putting a feeding tube.  We talked to the decision-maker daughter Sunday Spillers and she understands that feeding tube does not help.  We have not put an order for feeding tube.  We have started on gentle D5  IV fluids  until we have full support from the family for making her comfort care.  Sepsis present on admission/UTI: Elevated white cell counts, lactic acid on admission.  Urine culture showed E. coli.  She was ceftriaxone which was stopped 3 days ago because she was made comfort care.  She just completed 2 days of antibiotics.  We restarted  ceftriaxone because she is not formally in comfort care.  Hypertension: BP stable  AKI: Resolved with IV fluids.  Covid-19 encephalopathy/dementia: Not  oriented.  Continue to monitor mental status.  Also has toxic metabolic encephalopathy secondary to dehydration, UTI, AKI.  Patient also has dementia at baseline.  She has history of CVA  Transaminitis: Secondary to underlying sepsis/dehydration/shock liver from hypotension.  Goals of care: As mentioned above, ongoing confusion and disagreement in the family regarding her care.  Eldest  daughter,  [name], is very understandable and she is interested on making her comfort care but she is not official power of attorney.  She is discussing with other family members for conclusive decision.  Dr. Neale Burly from palliative  care closely following.  Continue comfort care measures like morphine, glycopyrrolate, anxiolytics for now.            DVT prophylaxis:None Code Status: DNR Family Communication: [name], the daughter on 09/13/19.  I will again call her tomorrow. Disposition Plan: Undetermined at this point.  Waiting to hear from family .  Candidate for hospice.   Consultants: Palliative care  Procedures: None  Antimicrobials:  Anti-infectives (From admission, onward)   Start     Dose/Rate Route Frequency Ordered Stop   09/14/19 1500  cefTRIAXone (ROCEPHIN) 1 g in sodium chloride 0.9 % 100 mL IVPB     1 g 200 mL/hr over 30 Minutes Intravenous Every 24 hours 09/14/19 1344     09/09/19 2200  cefTRIAXone (ROCEPHIN) 1 g in sodium chloride 0.9 % 100 mL IVPB  Status:  Discontinued     1 g 200 mL/hr over 30 Minutes Intravenous Every 24 hours 09/09/19 2027 09/11/19 0840   09/09/19 1000  metroNIDAZOLE (FLAGYL) IVPB 500 mg     500 mg 100 mL/hr over 60 Minutes Intravenous  Once 09/09/19 0957 09/09/19 1345   09/09/19 1000  ceFEPIme (MAXIPIME) 2 g in sodium chloride 0.9 % 100 mL IVPB     2 g 200 mL/hr over 30 Minutes Intravenous  Once 09/09/19 0959 09/09/19 1221   09/09/19 1000  vancomycin (VANCOCIN) IVPB 1000 mg/200 mL premix     1,000 mg 200 mL/hr over 60 Minutes Intravenous  Once 09/09/19 0959 09/09/19 1518         Objective: Vitals:   09/16/19 1534 09/16/19 2151 09/16/19 2153 09/17/19 0545  BP: 127/75 129/81 129/81 120/73  Pulse: (!) 107 (!) 108 77 100  Resp: 16 17 17 17   Temp: 98.5 F (36.9 C) 98.8 F (37.1 C) 98.8 F (37.1 C) (!) 97.5 F (36.4 C)  TempSrc: Oral Oral Oral Oral  SpO2: 93% 92% 92% (!) 83%  Weight:      Height:        Intake/Output Summary (Last 24 hours) at 09/17/2019 1345 Last data filed at 09/17/2019 1057 Gross per 24 hour  Intake 1636.21 ml  Output 450 ml  Net 1186.21 ml   Filed Weights   09/09/19 2300 09/10/19 0457 09/11/19 0500  Weight: 48.1 kg 48.1 kg 53.8  kg    Examination:  General exam: Chronically ill looking, not in   Distress, weak, debilitated HEENT: Ear/Nose normal on gross exam Respiratory system: Bilateral equal air entry,  no wheezes or crackles  Cardiovascular system: S1 & S2 heard, RRR. No JVD, murmurs, rubs, gallops or clicks. No pedal edema. Gastrointestinal system: Abdomen is nondistended, soft and nontender. No organomegaly or masses felt. Normal bowel sounds heard. Central nervous system: awake bot not that Alert,not oriented.  Extremities: No edema, no clubbing ,no cyanosis, Skin: No rashes, lesions or ulcers,no icterus ,no pallor   Data Reviewed: I have personally reviewed following labs and imaging studies  CBC: Recent Labs  Lab 09/11/19 0713 09/14/19 0828 09/17/19 0455  WBC 10.4 11.6* 9.3  NEUTROABS  --  8.9* 5.8  HGB 11.4* 11.0* 10.7*  HCT 37.9 36.3 32.0*  MCV 102.7* 100.8* 90.1  PLT 249 205 PLATELET CLUMPS NOTED ON SMEAR, UNABLE TO ESTIMATE   Basic Metabolic Panel: Recent Labs  Lab 09/11/19 0713 09/14/19 0828 09/15/19 0451 09/16/19 0438 09/17/19 0455  NA 158* 164* 158* 151* 146*  K 3.2* 3.7 4.5 4.4 4.1  CL 123* 130* 126* 120* 113*  CO2 20* 25 25 24 24   GLUCOSE 269* 190* 148* 111* 129*  BUN 81* 30* 21 17 14   CREATININE 1.63* 0.89 0.80 0.78 0.73  CALCIUM 7.9* 8.4* 7.9* 8.0* 7.7*   GFR: Estimated Creatinine Clearance: 45.8 mL/min (by C-G formula based on SCr of 0.73 mg/dL). Liver Function Tests: Recent Labs  Lab 09/11/19 0713  AST 22  ALT 47*  ALKPHOS 88  BILITOT 0.6  PROT 5.9*  ALBUMIN 3.0*   No results for input(s): LIPASE, AMYLASE in the last 168 hours. No results for input(s): AMMONIA in the last 168 hours. Coagulation Profile: No results for input(s): INR, PROTIME in the last 168 hours. Cardiac Enzymes: No results for input(s): CKTOTAL, CKMB, CKMBINDEX, TROPONINI in the last 168 hours. BNP (last 3 results) No results for input(s): PROBNP in the last 8760 hours. HbA1C: No  results for input(s): HGBA1C in the last 72 hours. CBG: Recent Labs  Lab 09/10/19 1544 09/10/19 1914 09/10/19 2312 09/11/19 0341 09/11/19 0825  GLUCAP 157* 184* 210* 248* 235*   Lipid Profile: No results for input(s): CHOL, HDL, LDLCALC, TRIG, CHOLHDL, LDLDIRECT in the last 72 hours. Thyroid Function Tests: No results for input(s): TSH, T4TOTAL, FREET4, T3FREE, THYROIDAB in the last 72 hours. Anemia Panel: No results for input(s): VITAMINB12, FOLATE, FERRITIN, TIBC, IRON, RETICCTPCT in the last 72 hours. Sepsis Labs: No results for input(s): PROCALCITON, LATICACIDVEN in the last 168 hours.  Recent Results (from the past 240 hour(s))  Blood Culture (routine x 2)     Status: None   Collection Time: 09/09/19  9:52 AM   Specimen: BLOOD  Result Value Ref Range Status   Specimen Description   Final    BLOOD SITE NOT SPECIFIED Performed at [facility], [address] [place], [place]    Special Requests   Final    BOTTLES DRAWN AEROBIC AND ANAEROBIC Blood Culture adequate volume Performed at [facility], [address], [place], [place]    Culture   Final    NO GROWTH 5 DAYS Performed at [facility], [address] [place], [address] [place]    Report Status 09/14/2019 FINAL  Final  Blood Culture (routine x 2)     Status: None   Collection Time: 09/09/19  9:57 AM   Specimen: BLOOD  Result Value Ref Range Status   Specimen Description   Final    BLOOD BLOOD LEFT ARM Performed at [facility], [address] [place], [place]    Special Requests   Final    BOTTLES DRAWN AEROBIC AND ANAEROBIC Blood Culture adequate volume Performed at [facility], [address] [place], [place]    Culture   Final    NO GROWTH 5 DAYS Performed at [facility], [address] [place], [address] [place]    Report Status 09/14/2019 FINAL  Final  Urine  culture     Status: Abnormal   Collection Time: 09/09/19 10:47 AM   Specimen: Urine, Random  Result Value Ref Range Status   Specimen Description   Final    URINE, RANDOM Performed at [facility], [address] [place], [place]    Special Requests   Final  NONE Performed at [facility], [address] [place], [place] [address]    Culture >=100,000 COLONIES/mL ESCHERICHIA COLI (A)  Final   Report Status 09/11/2019 FINAL  Final   Organism ID, Bacteria ESCHERICHIA COLI (A)  Final      Susceptibility   Escherichia coli - MIC*    AMPICILLIN 8 SENSITIVE Sensitive     CEFAZOLIN <=4 SENSITIVE Sensitive     CEFTRIAXONE <=0.25 SENSITIVE Sensitive     CIPROFLOXACIN <=0.25 SENSITIVE Sensitive     GENTAMICIN <=1 SENSITIVE Sensitive     IMIPENEM <=0.25 SENSITIVE Sensitive     NITROFURANTOIN <=16 SENSITIVE Sensitive     TRIMETH/SULFA <=20 SENSITIVE Sensitive     AMPICILLIN/SULBACTAM 4 SENSITIVE Sensitive     PIP/TAZO <=4 SENSITIVE Sensitive     * >=100,000 COLONIES/mL ESCHERICHIA COLI  SARS CORONAVIRUS 2 (TAT 6-24 HRS) Nasopharyngeal Nasopharyngeal Swab     Status: None   Collection Time: 09/09/19 10:47 AM   Specimen: Nasopharyngeal Swab  Result Value Ref Range Status   SARS Coronavirus 2 NEGATIVE NEGATIVE Final    Comment: (NOTE) SARS-CoV-2 target nucleic acids are NOT DETECTED. The SARS-CoV-2 RNA is generally detectable in upper and lower respiratory specimens during the acute phase of infection. Negative results do not preclude SARS-CoV-2 infection, do not rule out co-infections with other pathogens, and should not be used as the sole basis for treatment or other patient management decisions. Negative results must be combined with clinical observations, patient history, and epidemiological information. The expected result is Negative. Fact Sheet for Patients: HairSlick.no Fact Sheet for Healthcare  Providers: quierodirigir.com This test is not yet approved or cleared by the [place] FDA and  has been authorized for detection and/or diagnosis of SARS-CoV-2 by FDA under an Emergency Use Authorization (EUA). This EUA will remain  in effect (meaning this test can be used) for the duration of the COVID-19 declaration under Section 56 4(b)(1) of the Act, 21 U.S.C. section 360bbb-3(b)(1), unless the authorization is terminated or revoked sooner. Performed at [facility], [address] [place], [place] [address]   MRSA PCR Screening     Status: None   Collection Time: 09/09/19 10:50 PM   Specimen: Nasal Mucosa; Nasopharyngeal  Result Value Ref Range Status   MRSA by PCR NEGATIVE NEGATIVE Final    Comment:        The GeneXpert MRSA Assay (FDA approved for NASAL specimens only), is one component of a comprehensive MRSA colonization surveillance program. It is not intended to diagnose MRSA infection nor to guide or monitor treatment for MRSA infections. Performed at [facility], [address] [place], [place] [address]          Radiology Studies: No results found.      Scheduled Meds: . Chlorhexidine Gluconate Cloth  6 each Topical Daily  . feeding supplement (ENSURE ENLIVE)  237 mL Oral BID BM  . mouth rinse  15 mL Mouth Rinse BID   Continuous Infusions: . cefTRIAXone (ROCEPHIN)  IV 200 mL/hr at 09/16/19 1547  . dextrose 75 mL/hr at 09/17/19 1057     LOS: 8 days    Time spent: 35 mins.More than 50% of that time was spent in counseling and/or coordination of care.      Burnadette Pop, MD [facility] Pager [phone]  If 7PM-7AM, please contact night-coverage [email or web address] Password [email or web address] 09/17/2019, 1:45 PM

## 2019-09-17 NOTE — Progress Notes (Signed)
Daily Progress Note   Patient Name: Tracy [name]       Date: 09/17/2019 DOB: 1941-08-20  Age: 79 y.o. MRN#: [id number] Attending Physician: Burnadette Pop, MD Primary Care Physician: Tresa Garter, MD Admit Date: 09/09/2019  Reason for Consultation/Follow-up: Establishing goals of care  Subjective: I saw and examined Tracy [name] today.  She remains unable to participate in conversation.     No family at bedside.  Discussed with other daughter, Nettie [name], via phone.  Reviewed clinical course including that Tracy [name] continues to be unable to take enough nutrition or hydration to maintain herself.  Patient remains at high risk for recurrent infections, remains at high risk for dehydration/acute kidney injury.   Length of Stay: 8  Current Medications: Scheduled Meds:  . Chlorhexidine Gluconate Cloth  6 each Topical Daily  . feeding supplement (ENSURE ENLIVE)  237 mL Oral BID BM  . mouth rinse  15 mL Mouth Rinse BID    Continuous Infusions: . cefTRIAXone (ROCEPHIN)  IV 200 mL/hr at 09/16/19 1547  . dextrose 75 mL/hr at 09/17/19 0349    PRN Meds: acetaminophen **OR** acetaminophen, acetaminophen **OR** acetaminophen, antiseptic oral rinse, glycopyrrolate **OR** glycopyrrolate **OR** glycopyrrolate, haloperidol **OR** haloperidol **OR** haloperidol lactate, morphine injection, ondansetron **OR** ondansetron (ZOFRAN) IV, polyvinyl alcohol  Physical Exam  Frail, elderly appearing female in bed. Occasional mumbling but no meaningful conversation. Is able to say "yes Ma'am". Lying sideways in her bed.  Diminished breath sounds S1, S2 Abdomen soft          Vital Signs: BP 120/73 (BP Location: Right Arm)   Pulse 100   Temp (!) 97.5 F (36.4 C) (Oral)   Resp 17   Ht 5\' 2"   (1.575 m)   Wt 53.8 kg   SpO2 (!) 83%   BMI 21.69 kg/m  SpO2: SpO2: (!) 83 % O2 Device: O2 Device: Room Air O2 Flow Rate: O2 Flow Rate (L/min): 2 L/min  Intake/output summary:   Intake/Output Summary (Last 24 hours) at 09/17/2019 0938 Last data filed at 09/17/2019 0349 Gross per 24 hour  Intake 1189.25 ml  Output 450 ml  Net 739.25 ml   LBM: Last BM Date: (PTA) Baseline Weight: Weight: 48.1 kg Most recent weight: Weight: 53.8 kg       Palliative Assessment/Data:    Flowsheet Rows  Most Recent Value  Intake Tab  Referral Department  -- [ED]  Unit at Time of Referral  ER  Palliative Care Primary Diagnosis  Sepsis/Infectious Disease  Date Notified  09/09/19  Palliative Care Type  New Palliative care  Reason for referral  Clarify Goals of Care  Date of Admission  09/09/19  Date first seen by Palliative Care  09/13/19  # of days Palliative referral response time  4 Day(s)  # of days IP prior to Palliative referral  0  Clinical Assessment  Psychosocial & Spiritual Assessment  Palliative Care Outcomes      Patient Active Problem List   Diagnosis Date Noted  . Sepsis, unspecified organism ([name]) 09/09/2019  . Hypernatremia 09/09/2019  . Chronic respiratory failure with hypoxia ([name]) 09/09/2019  . Acute renal failure (ARF) ([name]) 09/09/2019  . Acute metabolic encephalopathy 09/09/2019  . Coagulopathy ([name]) 09/09/2019  . Transaminitis 09/09/2019  . Sepsis ([name]) 09/09/2019  . Pressure injury of skin 08/05/2019  . COVID-19 virus infection 07/30/2019  . Weakness 07/28/2019  . Hematochezia 07/31/2017  . Palliative care encounter 06/20/2017  . Abdominal pain 02/26/2017  . Insomnia 02/26/2017  . Cerumen impaction 12/04/2016  . Cervical disc disorder with radiculopathy of cervical region 09/22/2016  . Left rotator cuff tear arthropathy 09/22/2016  . Left shoulder pain 09/01/2016  . Left wrist pain 09/01/2016  . GERD (gastroesophageal reflux disease) 04/25/2016  .  Onychomycosis 03/06/2016  . Viral illness 12/21/2015  . Blood in stool 11/30/2015  . Tension headache 10/08/2015  . Subdural hematoma ([name]) 02/05/2015  . Palpitations 01/18/2015  . HLD (hyperlipidemia) 01/12/2015  . Stroke ([name]) 01/12/2015  . Anemia, iron deficiency 12/24/2014  . Generalized anxiety disorder 11/27/2014  . Abscess of right buttock 11/04/2014  . Diabetes mellitus type 2 in obese ([name])   . SIRS (systemic inflammatory response syndrome) ([name]) 10/26/2014  . Umbilical hernia 02/03/2014  . Dementia with aggressive behavior ([name]) 02/03/2014  . Loss of weight 10/06/2013  . Constipation 10/06/2013  . HTN (hypertension) 08/10/2013  . History of CVA (cerebrovascular accident) 08/10/2013    Palliative Care Assessment & Plan   Patient Profile: Tracy [name] is a 79 year old female with past medical history of dementia, CVA with right-sided deficits, hypertension, diabetes, Covid in November 20 with severe encephalopathy since that time who presented from SNF with less responsiveness.  She was obtunded and hypothermic with elevated creatinine and hypernatremia.  Transition was made to full comfort care on 09/11/2019 but some of the other children have disagree with that and she is back receiving IV hydration as well as antibiotics.     Recommendations/Plan: Patient appears to me to be transitioning toward actively dying and I am concerned about her ability to recover regardless of interventions moving forward.  Palliative medicine team has been  in discussion with family over the past few days but they remain divided on plan moving forward.  She has 6 children.   Call placed and discussed with patient's daughter Tracy [name].  I discussed with her about the patient's current condition.  I discussed with her about patient's underlying conditions.  Discussed that patient remains at high risk for ongoing decline, possibly transitioning towards actively dying.  Amount of care that focuses  exclusively on comfort, adhering to hospice philosophy of care seems to be most prudent in my opinion.  Tracy [name] states that her health is severely affected because of the patient's condition as well as because of her ongoing disagreements with her siblings.  She  has withdrawn from the process of guardianship.  She states she is not going to go through that.  She does not want further conflicts amongst her siblings.  She does not want to do that to the patient.  Ms. Sunday Spillers talks about how much death and loss her family has had over the course of the past several months.  Patient's husband has died, patient son has died in the past few months.  Hence, patient's children are all with anticipatory grief over losing the patient as well.  We did discuss about disposition options back to [place] [facility] with palliative versus transitioning to full comfort and considering residential hospice such as [facility].  Sunday Spillers will attempt to reach out to her siblings again, will attempt to gain consensus.  Code Status:    Code Status Orders  (From admission, onward)         Start     Ordered   09/11/19 0839  Do not attempt resuscitation (DNR)  Continuous    Question Answer Comment  In the event of cardiac or respiratory ARREST Do not call a "code blue"   In the event of cardiac or respiratory ARREST Do not perform Intubation, CPR, defibrillation or ACLS   In the event of cardiac or respiratory ARREST Use medication by any route, position, wound care, and other measures to relive pain and suffering. May use oxygen, suction and manual treatment of airway obstruction as needed for comfort.   Comments Discussed with daughter Sunday Spillers on 09/10/2019      09/11/19 0840        Code Status History    Date Active Date Inactive Code Status Order ID Comments User Context   09/10/2019 1149 09/11/2019 0840 DNR [id number]  [place] Eric J, [place] Inpatient   09/09/2019 2027 09/10/2019 1149 Full Code [id number]  Edwin Dada, MD ED   07/28/2019 1750 08/09/2019 1540 Full Code [id number]  Truddie Hidden, MD ED   02/05/2015 1659 02/07/2015 1502 Full Code [id number]  Ashok Pall, MD ED   10/26/2014 2213 10/30/2014 1926 Full Code [id number]  Etta Quill, DO ED   08/20/2013 1444 08/24/2013 1437 Full Code [id number]  Armandina Gemma, MD Inpatient   08/19/2013 0029 08/20/2013 1444 Full Code [id number]  Rolm Bookbinder, MD Inpatient   08/10/2013 0836 08/14/2013 2051 Full Code [id number]  Orvan Falconer, MD Inpatient   Advance Care Planning Activity       Prognosis:   Hours - Days?  Discharge Planning:  To Be Determined  Care plan was discussed with daughter Sunday Spillers on the phone  Thank you for allowing the Palliative Medicine Team to assist in the care of this patient.   Total Time 35 Prolonged Time Billed  No      Greater than 50%  of this time was spent counseling and coordinating care related to the above assessment and plan.  Loistine Chance, MD  Please contact Palliative Medicine Team phone at [phone] for questions and concerns.

## 2019-09-18 LAB — BASIC METABOLIC PANEL
Anion gap: 13 (ref 5–15)
BUN: 12 mg/dL (ref 8–23)
CO2: 24 mmol/L (ref 22–32)
Calcium: 7.7 mg/dL — ABNORMAL LOW (ref 8.9–10.3)
Chloride: 104 mmol/L (ref 98–111)
Creatinine, Ser: 0.66 mg/dL (ref 0.44–1.00)
GFR calc Af Amer: >60 mL/min (ref >60)
GFR calc non Af Amer: >60 mL/min (ref >60)
Glucose, Bld: 115 mg/dL — ABNORMAL HIGH (ref 70–99)
Potassium: 2.9 mmol/L — ABNORMAL LOW (ref 3.5–5.1)
Sodium: 141 mmol/L (ref 135–145)

## 2019-09-18 LAB — CBC WITH DIFFERENTIAL/PLATELET
Abs Immature Granulocytes: 0.11 10*3/uL — ABNORMAL HIGH (ref 0.00–0.07)
Basophils Absolute: 0.1 10*3/uL (ref 0.0–0.1)
Basophils Relative: 1 %
Eosinophils Absolute: 0.3 10*3/uL (ref 0.0–0.5)
Eosinophils Relative: 4 %
HCT: 35.9 % — ABNORMAL LOW (ref 36.0–46.0)
Hemoglobin: 11.9 g/dL — ABNORMAL LOW (ref 12.0–15.0)
Immature Granulocytes: 1 %
Lymphocytes Relative: 24 %
Lymphs Abs: 1.9 10*3/uL (ref 0.7–4.0)
MCH: 30.1 pg (ref 26.0–34.0)
MCHC: 33.1 g/dL (ref 30.0–36.0)
MCV: 90.7 fL (ref 80.0–100.0)
Monocytes Absolute: 0.7 10*3/uL (ref 0.1–1.0)
Monocytes Relative: 9 %
Neutro Abs: 5 10*3/uL (ref 1.7–7.7)
Neutrophils Relative %: 61 %
Platelets: UNDETERMINED 10*3/uL (ref 150–400)
RBC: 3.96 MIL/uL (ref 3.87–5.11)
RDW: 17 % — ABNORMAL HIGH (ref 11.5–15.5)
WBC: 8.1 10*3/uL (ref 4.0–10.5)
nRBC: 0 % (ref 0.0–0.2)

## 2019-09-18 MED ORDER — POTASSIUM CHLORIDE 10 MEQ/100ML IV SOLN: 10.0000 meq | INTRAVENOUS | Status: AC

## 2019-09-18 NOTE — Progress Notes (Signed)
PROGRESS NOTE    Tracy [name]  ZOX:[id number] DOB: 03/23/1941 DOA: 09/09/2019 PCP: Tresa Garter, MD   Brief Narrative: Patient is a 79 year old female with history of dementia, CVA with right-sided residual weakness, hypertension, diabetes, recent infection with Covid in November 2020 with severe encephalopathy who presents from skilled nursing facility with worsening mental status.  Patient was obtunded, altered on presentation.  Since her Covid infection, his mentation has been severely affected.  She became lethargic, hypotensive in the nursing home so she was sent to ER.  On presentation she was hypothermic, tachycardic, had AKI, sodium of 179.  Chest x-ray did not show pneumonia.  CT head/abdomen were unremarkable.  He was he was started on broad-spectrum antibiotics. Given her poor prognosis, multiple discussion was held with her eldest daughter [name] and she transitioned her care to comfort.  But later on, she came under  tremendous pressure from other family members regarding that decision.  Her other family members think that  she still has a chance to get better  and are interested on putting a feeding tube.  Palliative care consulted  09/17/2019: Mental status similar to yesterday.  Awake but not alert or oriented.  Opens her eyes.  Tries to speak on calling her name.  Not in distress. Palliative care following with family.  Patient's  eldest daughter [name] has withdrawn from process of guardianship.  She is discussing with her other family members about her mother's care plan. She is from [facility] . But as per the case manager, her insurance has high chance of denying for going back to the skilled nursing facility. I discussed with daughter [name].  As per our discussion, we will wait till Monday to hear from family. if we do not hear till Monday, will plan to send her home with hospice  Assessment & Plan:   Principal Problem:   Sepsis, unspecified organism  ([name]) Active Problems:   HTN (hypertension)   History of CVA (cerebrovascular accident)   Dementia with aggressive behavior ([name])   Diabetes mellitus type 2 in obese [facility])   Palliative care encounter   Hypernatremia   Chronic respiratory failure with hypoxia ([name])   Acute renal failure (ARF) ([name])   Acute metabolic encephalopathy   Coagulopathy ([name])   Transaminitis   Sepsis ([name])   Severe hypenatremia/dehydration/failure to thrive: Presented with worsening mental status, decreased responsiveness, hypotension, tachycardia.  Sodium was elevated to 178 on presentation.  Urine osmolality was 586, urine sodium was 14. Nephrology was consulted on presentation.  Nephrology has signed off because she was changed to comfort care. Family requested for putting a feeding tube.  We talked to the decision-maker daughter [name] and she understands that feeding tube does not help.  We have not put an order for feeding tube.  We have started on gentle D5  IV fluids  until we have full support from the family for making her comfort care.  Sepsis present on admission/UTI: Elevated white cell counts, lactic acid on admission.  Urine culture showed E. coli.  She completed antibiotics course  Hypertension: BP stable  AKI: Resolved with IV fluids.  Covid-19 encephalopathy/dementia: Not  oriented.  Continue to monitor mental status.  Also has toxic metabolic encephalopathy secondary to dehydration, UTI, AKI.  Patient also has dementia at baseline.  She has history of CVA  Transaminitis: Secondary to underlying sepsis/dehydration/shock liver from hypotension.  Goals of care: As mentioned above, ongoing confusion and disagreement in the family regarding her care.  Eldest  daughter, [name], is very understandable and she is interested on making her comfort care but she is not official power of attorney.  She is discussing with other family members for conclusive decision.  Palliative care closely following..   Continue other comfort care measures like morphine, glycopyrrolate, anxiolytics for now.            DVT prophylaxis:None Code Status: DNR Family Communication: [name], the daughter on 09/18/19. Disposition Plan: As above.Undetermined at this point.  Waiting to hear from family .  Candidate for hospice.   Consultants: Palliative care  Procedures: None  Antimicrobials:  Anti-infectives (From admission, onward)   Start     Dose/Rate Route Frequency Ordered Stop   09/14/19 1500  cefTRIAXone (ROCEPHIN) 1 g in sodium chloride 0.9 % 100 mL IVPB     1 g 200 mL/hr over 30 Minutes Intravenous Every 24 hours 09/14/19 1344     09/09/19 2200  cefTRIAXone (ROCEPHIN) 1 g in sodium chloride 0.9 % 100 mL IVPB  Status:  Discontinued     1 g 200 mL/hr over 30 Minutes Intravenous Every 24 hours 09/09/19 2027 09/11/19 0840   09/09/19 1000  metroNIDAZOLE (FLAGYL) IVPB 500 mg     500 mg 100 mL/hr over 60 Minutes Intravenous  Once 09/09/19 0957 09/09/19 1345   09/09/19 1000  ceFEPIme (MAXIPIME) 2 g in sodium chloride 0.9 % 100 mL IVPB     2 g 200 mL/hr over 30 Minutes Intravenous  Once 09/09/19 0959 09/09/19 1221   09/09/19 1000  vancomycin (VANCOCIN) IVPB 1000 mg/200 mL premix     1,000 mg 200 mL/hr over 60 Minutes Intravenous  Once 09/09/19 0959 09/09/19 1518         Objective: Vitals:   09/17/19 0545 09/17/19 1549 09/17/19 2002 09/18/19 0546  BP: 120/73 126/72 (!) 117/49 126/75  Pulse: 100 95 91 (!) 105  Resp: 17 15 16 16   Temp: (!) 97.5 F (36.4 C) 98.3 F (36.8 C) 98.6 F (37 C) 98.3 F (36.8 C)  TempSrc: Oral Oral Oral Oral  SpO2: (!) 83% 96% 100% 98%  Weight:      Height:        Intake/Output Summary (Last 24 hours) at 09/18/2019 1612 Last data filed at 09/18/2019 0549 Gross per 24 hour  Intake 200 ml  Output 200 ml  Net 0 ml   Filed Weights   09/09/19 2300 09/10/19 0457 09/11/19 0500  Weight: 48.1 kg 48.1 kg 53.8 kg    Examination:  General exam: Chronically ill  looking, not in   Distress, weak, debilitated HEENT: Ear/Nose normal on gross exam Respiratory system: Bilateral equal air entry,  no wheezes or crackles  Cardiovascular system: S1 & S2 heard, RRR. No JVD, murmurs, rubs, gallops or clicks. No pedal edema. Gastrointestinal system: Abdomen is nondistended, soft and nontender. No organomegaly or masses felt. Normal bowel sounds heard. Central nervous system: awake bot not that Alert,not oriented.  Extremities: No edema, no clubbing ,no cyanosis, Skin: No rashes, lesions or ulcers,no icterus ,no pallor   Data Reviewed: I have personally reviewed following labs and imaging studies  CBC: Recent Labs  Lab 09/14/19 0828 09/17/19 0455 09/18/19 0554  WBC 11.6* 9.3 8.1  NEUTROABS 8.9* 5.8 5.0  HGB 11.0* 10.7* 11.9*  HCT 36.3 32.0* 35.9*  MCV 100.8* 90.1 90.7  PLT 205 PLATELET CLUMPS NOTED ON SMEAR, UNABLE TO ESTIMATE PLATELET CLUMPS NOTED ON SMEAR, UNABLE TO ESTIMATE   Basic Metabolic Panel: Recent Labs  Lab 09/14/19 0828 09/15/19 0451 09/16/19 0438 09/17/19 0455 09/18/19 0554  NA 164* 158* 151* 146* 141  K 3.7 4.5 4.4 4.1 2.9*  CL 130* 126* 120* 113* 104  CO2 25 25 24 24 24   GLUCOSE 190* 148* 111* 129* 115*  BUN 30* 21 17 14 12   CREATININE 0.89 0.80 0.78 0.73 0.66  CALCIUM 8.4* 7.9* 8.0* 7.7* 7.7*   GFR: Estimated Creatinine Clearance: 45.8 mL/min (by C-G formula based on SCr of 0.66 mg/dL). Liver Function Tests: No results for input(s): AST, ALT, ALKPHOS, BILITOT, PROT, ALBUMIN in the last 168 hours. No results for input(s): LIPASE, AMYLASE in the last 168 hours. No results for input(s): AMMONIA in the last 168 hours. Coagulation Profile: No results for input(s): INR, PROTIME in the last 168 hours. Cardiac Enzymes: No results for input(s): CKTOTAL, CKMB, CKMBINDEX, TROPONINI in the last 168 hours. BNP (last 3 results) No results for input(s): PROBNP in the last 8760 hours. HbA1C: No results for input(s): HGBA1C in the  last 72 hours. CBG: No results for input(s): GLUCAP in the last 168 hours. Lipid Profile: No results for input(s): CHOL, HDL, LDLCALC, TRIG, CHOLHDL, LDLDIRECT in the last 72 hours. Thyroid Function Tests: No results for input(s): TSH, T4TOTAL, FREET4, T3FREE, THYROIDAB in the last 72 hours. Anemia Panel: No results for input(s): VITAMINB12, FOLATE, FERRITIN, TIBC, IRON, RETICCTPCT in the last 72 hours. Sepsis Labs: No results for input(s): PROCALCITON, LATICACIDVEN in the last 168 hours.  Recent Results (from the past 240 hour(s))  Blood Culture (routine x 2)     Status: None   Collection Time: 09/09/19  9:52 AM   Specimen: BLOOD  Result Value Ref Range Status   Specimen Description   Final    BLOOD SITE NOT SPECIFIED Performed at [facility], [address] [place], [place]    Special Requests   Final    BOTTLES DRAWN AEROBIC AND ANAEROBIC Blood Culture adequate volume Performed at [facility], [address], [place], [place]    Culture   Final    NO GROWTH 5 DAYS Performed at [facility], [address], [place], [address] [place]    Report Status 09/14/2019 FINAL  Final  Blood Culture (routine x 2)     Status: None   Collection Time: 09/09/19  9:57 AM   Specimen: BLOOD  Result Value Ref Range Status   Specimen Description   Final    BLOOD BLOOD LEFT ARM Performed at [facility], [address], [place], [place]    Special Requests   Final    BOTTLES DRAWN AEROBIC AND ANAEROBIC Blood Culture adequate volume Performed at [facility], [address], [place], [place]    Culture   Final    NO GROWTH 5 DAYS Performed at [facility], [address] [place], [address] [place]    Report Status 09/14/2019 FINAL  Final  Urine culture     Status: Abnormal   Collection Time: 09/09/19 10:47 AM   Specimen: Urine, Random  Result Value Ref  Range Status   Specimen Description   Final    URINE, RANDOM Performed at [facility], [address] [place], [place]    Special Requests   Final    NONE Performed at [facility], [address] [place], [place]    Culture >=100,000 COLONIES/mL ESCHERICHIA COLI (A)  Final   Report Status 09/11/2019 FINAL  Final   Organism ID, Bacteria ESCHERICHIA COLI (A)  Final      Susceptibility   Escherichia coli - MIC*    AMPICILLIN 8 SENSITIVE Sensitive     CEFAZOLIN <=4 SENSITIVE Sensitive     CEFTRIAXONE <=0.25 SENSITIVE Sensitive     CIPROFLOXACIN <=0.25 SENSITIVE Sensitive     GENTAMICIN <=1 SENSITIVE Sensitive     IMIPENEM <=0.25 SENSITIVE Sensitive     NITROFURANTOIN <=16 SENSITIVE Sensitive     TRIMETH/SULFA <=20 SENSITIVE Sensitive     AMPICILLIN/SULBACTAM 4 SENSITIVE Sensitive     PIP/TAZO <=4 SENSITIVE Sensitive     * >=100,000 COLONIES/mL ESCHERICHIA COLI  SARS CORONAVIRUS 2 (TAT 6-24 HRS) Nasopharyngeal Nasopharyngeal Swab     Status: None   Collection Time: 09/09/19 10:47 AM   Specimen: Nasopharyngeal Swab  Result Value Ref Range Status   SARS Coronavirus 2 NEGATIVE NEGATIVE Final    Comment: (NOTE) SARS-CoV-2 target nucleic acids are NOT DETECTED. The SARS-CoV-2 RNA is generally detectable in upper and lower respiratory specimens during the acute phase of infection. Negative results do not preclude SARS-CoV-2 infection, do not rule out co-infections with other pathogens, and should not be used as the sole basis for treatment or other patient management decisions. Negative results must be combined with clinical observations, patient history, and epidemiological information. The expected result is Negative. Fact Sheet for Patients: SugarRoll.be Fact Sheet for Healthcare Providers: https://www.woods-mathews.com/ This test is not yet approved or cleared by the [place] FDA and  has been authorized for detection and/or diagnosis of SARS-CoV-2 by FDA under an Emergency Use Authorization (EUA). This EUA will remain  in effect (meaning this test can be used) for the duration of the COVID-19 declaration under Section 56 4(b)(1) of the Act, 21 U.S.C. section 360bbb-3(b)(1), unless the authorization is terminated or revoked sooner. Performed at [facility], [place] [address] [place], [place] [address]   MRSA PCR Screening     Status: None   Collection Time: 09/09/19 10:50 PM   Specimen: Nasal Mucosa; Nasopharyngeal  Result Value Ref Range Status   MRSA by PCR NEGATIVE NEGATIVE Final    Comment:        The GeneXpert MRSA Assay (FDA approved for NASAL specimens only), is one component of a comprehensive MRSA colonization surveillance program. It is not intended to diagnose MRSA infection nor to guide or monitor treatment for MRSA infections. Performed at [facility], [place] [address], [place], [place] [address]          Radiology Studies: No results found.      Scheduled Meds: . Chlorhexidine Gluconate Cloth  6 each Topical Daily  . feeding supplement (ENSURE ENLIVE)  237 mL Oral BID BM  . mouth rinse  15 mL Mouth Rinse BID   Continuous Infusions: . cefTRIAXone (ROCEPHIN)  IV 1 g (09/17/19 1558)  . dextrose 75 mL/hr at 09/17/19 1057  . potassium chloride       LOS: 9 days    Time spent: 35 mins.More than 50% of that time was spent in counseling and/or coordination of care.      Shelly Coss, MD [facility] Pager [phone]  If 7PM-7AM, please contact night-coverage [email or web address] Password [facility] 09/18/2019, 4:12 PM

## 2019-09-18 NOTE — Progress Notes (Signed)
Daily Progress Note   Patient Name: Tracy [name]       Date: 09/18/2019 DOB: Mar 09, 1941  Age: 79 y.o. MRN#: [id number] Attending Physician: Burnadette Pop, MD Primary Care Physician: Tresa Garter, MD Admit Date: 09/09/2019  Reason for Consultation/Follow-up: Establishing goals of care  Subjective: I saw and examined Tracy [name] today.  She remains unable to participate in conversation. Does not appear to be in distress.  No family at bedside.  Discussed with TRH MD about the patient's condition and her family's coping with her present state.      Patient remains at high risk for recurrent infections, remains at high risk for dehydration/acute kidney injury.   Length of Stay: 9  Current Medications: Scheduled Meds:  . Chlorhexidine Gluconate Cloth  6 each Topical Daily  . feeding supplement (ENSURE ENLIVE)  237 mL Oral BID BM  . mouth rinse  15 mL Mouth Rinse BID    Continuous Infusions: . cefTRIAXone (ROCEPHIN)  IV 1 g (09/17/19 1558)  . dextrose 75 mL/hr at 09/17/19 1057  . potassium chloride      PRN Meds: acetaminophen **OR** acetaminophen, acetaminophen **OR** acetaminophen, antiseptic oral rinse, glycopyrrolate **OR** glycopyrrolate **OR** glycopyrrolate, haloperidol **OR** haloperidol **OR** haloperidol lactate, morphine injection, ondansetron **OR** ondansetron (ZOFRAN) IV, polyvinyl alcohol  Physical Exam  Frail, elderly appearing female in bed. Occasional mumbling but no meaningful conversation.  Lying sideways in her bed.  Diminished breath sounds S1, S2 Abdomen soft          Vital Signs: BP 126/75 (BP Location: Right Arm)   Pulse (!) 105   Temp 98.3 F (36.8 C) (Oral)   Resp 16   Ht 5\' 2"  (1.575 m)   Wt 53.8 kg   SpO2 98%   BMI 21.69 kg/m    SpO2: SpO2: 98 % O2 Device: O2 Device: Room Air O2 Flow Rate: O2 Flow Rate (L/min): 2 L/min  Intake/output summary:   Intake/Output Summary (Last 24 hours) at 09/18/2019 1035 Last data filed at 09/18/2019 0549 Gross per 24 hour  Intake 656.96 ml  Output 400 ml  Net 256.96 ml   LBM: Last BM Date: (PTA) Baseline Weight: Weight: 48.1 kg Most recent weight: Weight: 53.8 kg       Palliative Assessment/Data:    Flowsheet Rows     Most  Recent Value  Intake Tab  Referral Department  -- [ED]  Unit at Time of Referral  ER  Palliative Care Primary Diagnosis  Sepsis/Infectious Disease  Date Notified  09/09/19  Palliative Care Type  New Palliative care  Reason for referral  Clarify Goals of Care  Date of Admission  09/09/19  Date first seen by Palliative Care  09/13/19  # of days Palliative referral response time  4 Day(s)  # of days IP prior to Palliative referral  0  Clinical Assessment  Psychosocial & Spiritual Assessment  Palliative Care Outcomes      Patient Active Problem List   Diagnosis Date Noted  . Sepsis, unspecified organism ([name]) 09/09/2019  . Hypernatremia 09/09/2019  . Chronic respiratory failure with hypoxia ([name]) 09/09/2019  . Acute renal failure (ARF) ([name]) 09/09/2019  . Acute metabolic encephalopathy 09/09/2019  . Coagulopathy ([name]) 09/09/2019  . Transaminitis 09/09/2019  . Sepsis ([name]) 09/09/2019  . Pressure injury of skin 08/05/2019  . COVID-19 virus infection 07/30/2019  . Weakness 07/28/2019  . Hematochezia 07/31/2017  . Palliative care encounter 06/20/2017  . Abdominal pain 02/26/2017  . Insomnia 02/26/2017  . Cerumen impaction 12/04/2016  . Cervical disc disorder with radiculopathy of cervical region 09/22/2016  . Left rotator cuff tear arthropathy 09/22/2016  . Left shoulder pain 09/01/2016  . Left wrist pain 09/01/2016  . GERD (gastroesophageal reflux disease) 04/25/2016  . Onychomycosis 03/06/2016  . Viral illness 12/21/2015  . Blood in  stool 11/30/2015  . Tension headache 10/08/2015  . Subdural hematoma ([name]) 02/05/2015  . Palpitations 01/18/2015  . HLD (hyperlipidemia) 01/12/2015  . Stroke ([name]) 01/12/2015  . Anemia, iron deficiency 12/24/2014  . Generalized anxiety disorder 11/27/2014  . Abscess of right buttock 11/04/2014  . Diabetes mellitus type 2 in obese ([name])   . SIRS (systemic inflammatory response syndrome) ([name]) 10/26/2014  . Umbilical hernia 02/03/2014  . Dementia with aggressive behavior ([name]) 02/03/2014  . Loss of weight 10/06/2013  . Constipation 10/06/2013  . HTN (hypertension) 08/10/2013  . History of CVA (cerebrovascular accident) 08/10/2013    Palliative Care Assessment & Plan   Patient Profile: Tracy [name] is a 79 year old female with past medical history of dementia, CVA with right-sided deficits, hypertension, diabetes, Covid in November 20 with severe encephalopathy since that time who presented from SNF with less responsiveness.  She was obtunded and hypothermic with elevated creatinine and hypernatremia.  Transition was made to full comfort care on 09/11/2019 but some of the other children have disagree with that and she is back receiving IV hydration as well as antibiotics.     Recommendations/Plan: Continue current mode of care for now. Daughter [name] attempting to gain consensus and attempting to reach out to her siblings regarding overall goals of care. They are deciding between disposition options: back to [facility] place with palliative versus transitioning to full comfort and considering residential hospice such as [facility].     Code Status:    Code Status Orders  (From admission, onward)         Start     Ordered   09/11/19 0839  Do not attempt resuscitation (DNR)  Continuous    Question Answer Comment  In the event of cardiac or respiratory ARREST Do not call a "code blue"   In the event of cardiac or respiratory ARREST Do not perform Intubation, CPR, defibrillation or  ACLS   In the event of cardiac or respiratory ARREST Use medication by any route, position, wound care, and  other measures to relive pain and suffering. May use oxygen, suction and manual treatment of airway obstruction as needed for comfort.   Comments Discussed with daughter Sunday Spillers on 09/10/2019      09/11/19 0840        Code Status History    Date Active Date Inactive Code Status Order ID Comments User Context   09/10/2019 1149 09/11/2019 0840 DNR [id number]  [place] J, [place] Inpatient   09/09/2019 2027 09/10/2019 1149 Full Code [id number]  Edwin Dada, MD ED   07/28/2019 1750 08/09/2019 1540 Full Code [id number]  Truddie Hidden, MD ED   02/05/2015 1659 02/07/2015 1502 Full Code [id number]  Ashok Pall, MD ED   10/26/2014 2213 10/30/2014 1926 Full Code [id number]  Etta Quill, DO ED   08/20/2013 1444 08/24/2013 1437 Full Code [id number]  Armandina Gemma, MD Inpatient   08/19/2013 0029 08/20/2013 1444 Full Code [id number]  Rolm Bookbinder, MD Inpatient   08/10/2013 0836 08/14/2013 2051 Full Code [id number]  Orvan Falconer, MD Inpatient   Advance Care Planning Activity       Prognosis:   Hours - Days?  Discharge Planning:  To Be Determined  Care plan was discussed with [facility] MD.   Thank you for allowing the Palliative Medicine Team to assist in the care of this patient.   Total Time 15 Prolonged Time Billed  No      Greater than 50%  of this time was spent counseling and coordinating care related to the above assessment and plan.  Loistine Chance, MD  Please contact Palliative Medicine Team phone at [phone] for questions and concerns.

## 2019-09-19 ENCOUNTER — Telehealth: Payer: Self-pay | Admitting: Internal Medicine

## 2019-09-19 MED ORDER — POTASSIUM CHLORIDE 10 MEQ/100ML IV SOLN
10.0000 meq | Freq: Once | INTRAVENOUS | Status: AC
  Administered 2019-09-19: 10 meq via INTRAVENOUS
  Filled 2019-09-19: qty 100

## 2019-09-19 MED ORDER — POTASSIUM CHLORIDE 20 MEQ PO PACK
40.0000 meq | PACK | Freq: Once | ORAL | Status: AC
  Administered 2019-09-19: 11:00:00 40 meq via ORAL
  Filled 2019-09-19: qty 2

## 2019-09-19 MED ORDER — POTASSIUM CHLORIDE 10 MEQ/100ML IV SOLN
10.0000 meq | INTRAVENOUS | Status: AC
  Administered 2019-09-19 (×2): 10 meq via INTRAVENOUS
  Filled 2019-09-19 (×2): qty 100

## 2019-09-19 MED ORDER — POTASSIUM CHLORIDE 10 MEQ/100ML IV SOLN
10.0000 meq | INTRAVENOUS | Status: AC
  Administered 2019-09-19 (×2): 10 meq via INTRAVENOUS

## 2019-09-19 NOTE — Progress Notes (Signed)
CRITICAL VALUE ALERT  Critical Value:  K+ 2.7  Date & Time Notied:  1/8 0604  Provider Notified: Arvilla Market  Orders Received/Actions taken: 3 K+ runs ordered

## 2019-09-19 NOTE — Progress Notes (Signed)
Civil engineer, contracting [facility])  Received referral from [facility] manager for hospice services at home once discharged.  Spoke with [name] and Verlon Au and confirmed d/c plans.  She is d/c to [address] [place] [address].   Possibly d/c tomorrow, will need ambulance transport home.  DME discussed, will order bed and bedside table.  Thank you, Wallis Bamberg RN, BSN, CCRN [facility] Liaison

## 2019-09-19 NOTE — Progress Notes (Signed)
Daily Progress Note   Patient Name: Tracy [name]       Date: 09/19/2019 DOB: 1941/02/24  Age: 79 y.o. MRN#: [id number] Attending Physician: Cipriano Bunker, MD Primary Care Physician: Tresa Garter, MD Admit Date: 09/09/2019  Reason for Consultation/Follow-up: Establishing goals of care  Subjective: I saw and examined Tracy [name] today.  She remains unable to participate in conversation. Does not appear to be in distress. No change from yesterday in her overall condition.   No family at bedside.  Discussed with [facility] MD RN and CSW.       Patient remains at high risk for recurrent infections, remains at high risk for dehydration/acute kidney injury.   Length of Stay: 10  Current Medications: Scheduled Meds:  . Chlorhexidine Gluconate Cloth  6 each Topical Daily  . feeding supplement (ENSURE ENLIVE)  237 mL Oral BID BM  . mouth rinse  15 mL Mouth Rinse BID    Continuous Infusions: . dextrose 75 mL/hr at 09/19/19 0727    PRN Meds: acetaminophen **OR** acetaminophen, acetaminophen **OR** acetaminophen, antiseptic oral rinse, glycopyrrolate **OR** glycopyrrolate **OR** glycopyrrolate, haloperidol **OR** haloperidol **OR** haloperidol lactate, morphine injection, ondansetron **OR** ondansetron (ZOFRAN) IV, polyvinyl alcohol  Physical Exam  Frail, elderly appearing female in bed. Occasional mumbling but no meaningful conversation.  Lying sideways in her bed.  Diminished breath sounds S1, S2 Abdomen soft          Vital Signs: BP 119/75 (BP Location: Right Arm)   Pulse 95   Temp 97.7 F (36.5 C) (Oral)   Resp 16   Ht 5\' 2"  (1.575 m)   Wt 53.8 kg   SpO2 100%   BMI 21.69 kg/m  SpO2: SpO2: 100 % O2 Device: O2 Device: Room Air O2 Flow Rate: O2 Flow Rate (L/min): 2 L/min  Intake/output summary:   Intake/Output Summary (Last 24 hours) at 09/19/2019 1343 Last data filed at 09/19/2019 0700 Gross per 24 hour  Intake -  Output 850 ml  Net -850 ml   LBM: Last BM Date: 09/19/19 Baseline Weight: Weight: 48.1 kg Most recent weight: Weight: 53.8 kg       Palliative Assessment/Data:    Flowsheet Rows     Most Recent Value  Intake Tab  Referral Department  -- [ED]  Unit at Time of Referral  ER  Palliative Care Primary Diagnosis  Sepsis/Infectious Disease  Date Notified  09/09/19  Palliative Care Type  New Palliative care  Reason for referral  Clarify Goals of Care  Date of Admission  09/09/19  Date first seen by Palliative Care  09/13/19  # of days Palliative referral response time  4 Day(s)  # of days IP prior to Palliative referral  0  Clinical Assessment  Psychosocial & Spiritual Assessment  Palliative Care Outcomes      Patient Active Problem List   Diagnosis Date Noted  . Sepsis, unspecified organism ([place]) 09/09/2019  . Hypernatremia 09/09/2019  . Chronic respiratory failure with hypoxia ([place]) 09/09/2019  . Acute renal failure (ARF) ([place]) 09/09/2019  . Acute metabolic encephalopathy 40/04/6760  . Coagulopathy ([place]) 09/09/2019  . Transaminitis 09/09/2019  . Sepsis ([place]) 09/09/2019  . Pressure injury of skin 08/05/2019  . COVID-19 virus infection 07/30/2019  . Weakness 07/28/2019  . Hematochezia 07/31/2017  . Palliative care encounter 06/20/2017  . Abdominal pain 02/26/2017  . Insomnia 02/26/2017  . Cerumen impaction 12/04/2016  . Cervical disc disorder with radiculopathy of cervical region 09/22/2016  . Left rotator cuff tear arthropathy 09/22/2016  . Left shoulder pain 09/01/2016  . Left wrist pain 09/01/2016  . GERD (gastroesophageal reflux disease) 04/25/2016  . Onychomycosis 03/06/2016  . Viral illness 12/21/2015  . Blood in stool 11/30/2015  . Tension headache 10/08/2015  . Subdural hematoma ([place]) 02/05/2015  . Palpitations  01/18/2015  . HLD (hyperlipidemia) 01/12/2015  . Stroke ([place]) 01/12/2015  . Anemia, iron deficiency 12/24/2014  . Generalized anxiety disorder 11/27/2014  . Abscess of right buttock 11/04/2014  . Diabetes mellitus type 2 in obese ([place])   . SIRS (systemic inflammatory response syndrome) ([place]) 10/26/2014  . Umbilical hernia 95/05/3266  . Dementia with aggressive behavior ([place]) 02/03/2014  . Loss of weight 10/06/2013  . Constipation 10/06/2013  . HTN (hypertension) 08/10/2013  . History of CVA (cerebrovascular accident) 08/10/2013    Palliative Care Assessment & Plan   Patient Profile: Tracy [name] is a 79 year old female with past medical history of dementia, CVA with right-sided deficits, hypertension, diabetes, Covid in November 20 with severe encephalopathy since that time who presented from SNF with less responsiveness.  She was obtunded and hypothermic with elevated creatinine and hypernatremia.  Transition was made to full comfort care on 09/11/2019 but some of the other children have disagree with that and she is back receiving IV hydration as well as antibiotics.     Recommendations/Plan: It appears that there is now consensus for home with hospice, patient likely to be d/c soon. No new or additional PMT specific recommendations at this time. Appreciate CSW assistance.    Code Status:    Code Status Orders  (From admission, onward)         Start     Ordered   09/11/19 0839  Do not attempt resuscitation (DNR)  Continuous    Question Answer Comment  In the event of cardiac or respiratory ARREST Do not call a "code blue"   In the event of cardiac or respiratory ARREST Do not perform Intubation, CPR, defibrillation or ACLS   In the event of cardiac or respiratory ARREST Use medication by any route, position, wound care, and other measures to relive pain and suffering. May use oxygen, suction and manual treatment of airway obstruction as needed for comfort.   Comments  Discussed with daughter Sunday Spillers on 09/10/2019      09/11/19 0840  Code Status History    Date Active Date Inactive Code Status Order ID Comments User Context   09/10/2019 1149 09/11/2019 0840 DNR [id number]  [place], Eric J, [place] Inpatient   09/09/2019 2027 09/10/2019 1149 Full Code [id number]  Alberteen Sam, MD ED   07/28/2019 1750 08/09/2019 1540 Full Code [id number]  Clydia Llano, MD ED   02/05/2015 1659 02/07/2015 1502 Full Code [id number]  Coletta Memos, MD ED   10/26/2014 2213 10/30/2014 1926 Full Code [id number]  Hillary Bow, DO ED   08/20/2013 1444 08/24/2013 1437 Full Code [id number]  Darnell Level, MD Inpatient   08/19/2013 0029 08/20/2013 1444 Full Code [id number]  Emelia Loron, MD Inpatient   08/10/2013 0836 08/14/2013 2051 Full Code [id number]  Houston Siren, MD Inpatient   Advance Care Planning Activity       Prognosis:   guarded   Discharge Planning:  Home with hospice, with family.   Care plan was discussed with as above.    Thank you for allowing the Palliative Medicine Team to assist in the care of this patient.   Total Time 15 Prolonged Time Billed  No      Greater than 50%  of this time was spent counseling and coordinating care related to the above assessment and plan.  Rosalin Hawking, MD  Please contact Palliative Medicine Team phone at [phone] for questions and concerns.

## 2019-09-19 NOTE — Care Management Important Message (Signed)
Important Message  Patient Details IM Letter given to [name] RN Case Manager to present to the Patient Name: Tracy [name] MRN: [id number] Date of Birth: April 15, 1941   Medicare Important Message Given:  Yes     Caren Macadam 09/19/2019, 10:55 AM

## 2019-09-19 NOTE — TOC Transition Note (Signed)
Transition of Care [facility]) - CM/SW Discharge Note   Patient Details  Name: Tracy [name] MRN: [id number] Date of Birth: 1941-01-26  [facility] CM/SW Contact:  Bartholome Bill, RN Phone Number: 09/19/2019, 1:02 PM   Clinical Narrative:    This CM was alerted by nursing staff that [name] (eldest daughter) had called department and informed nurse that family was decided on home with hospice and to call [name] or Verlon Au for arrangements. This CM spoke with [name] at length about home hospice services and offered choice. [facility] was chosen and referral given to [employer]. Family requesting hospital bed. Pt to go to [address] [place]. Will need Yellow DNR signed by MD and PTAR transport home.     Barriers to Discharge: No Barriers Identified   Patient Goals and CMS Choice     Choice offered to / list presented to : Adult Children         HH Agency: [facility] Date [facility] Agency Contacted: 09/19/19 Time HH Agency Contacted: 1301 Representative spoke with at [facility] Agency: Victorino Dike  Social Determinants of Health (SDOH) Interventions     Readmission Risk Interventions Readmission Risk Prevention Plan 09/19/2019  Transportation Screening Complete  PCP or Specialist Appt within 3-5 Days Complete  HRI or Home Care Consult Complete  Social Work Consult for Recovery Care Planning/Counseling Complete  Palliative Care Screening Complete  Medication Review [employer]) Complete  Some recent data might be hidden

## 2019-09-19 NOTE — Telephone Encounter (Signed)
Routing to dr plotnikov, please advise, thanks 

## 2019-09-19 NOTE — Telephone Encounter (Signed)
Tracy [name], with Hospice, calling to inquire if Dr. Posey Rea will be attending physician. Sending high priority as patient is discharging from hospital today and Jenn requests a call back before office closes.

## 2019-09-19 NOTE — Progress Notes (Signed)
PROGRESS NOTE    Tracy [name]  JOA:[id number] DOB: [birth year] DOA: 09/09/2019   PCP: Tresa Garter, MD   Brief Narrative:  Patient is a 79 year old female with history of dementia, CVA with right-sided residual weakness, hypertension, diabetes, recent infection with Covid in November 2020 with severe encephalopathy who presents from skilled nursing facility with worsening mental status. Patient was obtunded, altered on presentation.  Since her Covid infection, her mentation has been severely affected.  She became lethargic, hypotensive in the nursing home so she was sent to ER.  On presentation she was hypothermic, tachycardic, had AKI, sodium of 179. Chest x-ray did not show pneumonia.  CT head/abdomen were unremarkable.  He was started on broad-spectrum antibiotics. Given her poor prognosis, multiple discussion was held with her eldest daughter [name] and she transitioned her care to comfort.  But later on, she came under tremendous pressure from other family members regarding that decision.  Her other family members think that she still has a chance to get better and are interested on putting a feeding tube.  Palliative care consulted. Palliative care following with family.  Patient's  eldest daughter [name] has withdrawn from process of guardianship.  She is discussing with her other family members about her mother's care plan. She is from [facility] . But as per the case manager, her insurance has high chance of denying for going back to the skilled nursing facility. Family has decided for home with home hospice.  Assessment & Plan:   Principal Problem:   Sepsis, unspecified organism ([name]) Active Problems:   HTN (hypertension)   History of CVA (cerebrovascular accident)   Dementia with aggressive behavior ([name])   Diabetes mellitus type 2 in obese [facility])   Palliative care encounter   Hypernatremia   Chronic respiratory failure with hypoxia ([name])   Acute renal failure (ARF)  ([name])   Acute metabolic encephalopathy   Coagulopathy ([name])   Transaminitis   Sepsis ([name])  Severe hypenatremia/dehydration/failure to thrive: Presented with worsening mental status, decreased responsiveness, hypotension, tachycardia.  Sodium was elevated to 178 on presentation.  Urine osmolality was 586, urine sodium was 14. Nephrology was consulted on presentation.  Nephrology has signed off because she was changed to comfort care. Family requested for putting a feeding tube.  We talked to the decision-maker daughter [name] and she understands that feeding tube does not help.  We have not put an order for feeding tube.  We have started on gentle D5  IV fluids until we have full support from the family for making her comfort care.  Family has decided for home with home hospice.  Sepsis present on admission/UTI: Elevated white cell counts, lactic acid on admission.  Urine culture showed E. coli.  She completed antibiotics course.  Hypertension: BP stable.  AKI: Resolved with IV fluids.  Covid-19 encephalopathy/dementia: Not  oriented.  Continue to monitor mental status.  Also has toxic metabolic encephalopathy secondary to dehydration, UTI, AKI.  Patient also has dementia at baseline.  She has history of CVA  Transaminitis: Secondary to underlying sepsis/dehydration/shock liver from hypotension.  Goals of care: As mentioned above, ongoing confusion and disagreement in the family regarding her care.  Eldest daughter, [name], is very understandable and she is interested on making her comfort care but she is not official power of attorney.  She is discussing with other family members for conclusive decision.  Palliative care closely following..  Continue other comfort care measures like morphine, glycopyrrolate, anxiolytics for now. Family has  decided for home with home hospice.  Anticipated discharge tomorrow, needS ambulance transport home.   DVT prophylaxis: None. Code Status:  DNR Family Communication: [name] Daughter on 09/18/19 Disposition Plan: Home with Home hospice. Consultants:   Palliative care.  Procedures: None Antimicrobials: Anti-infectives (From admission, onward)   Start     Dose/Rate Route Frequency Ordered Stop   09/14/19 1500  cefTRIAXone (ROCEPHIN) 1 g in sodium chloride 0.9 % 100 mL IVPB  Status:  Discontinued     1 g 200 mL/hr over 30 Minutes Intravenous Every 24 hours 09/14/19 1344 09/18/19 1624   09/09/19 2200  cefTRIAXone (ROCEPHIN) 1 g in sodium chloride 0.9 % 100 mL IVPB  Status:  Discontinued     1 g 200 mL/hr over 30 Minutes Intravenous Every 24 hours 09/09/19 2027 09/11/19 0840   09/09/19 1000  metroNIDAZOLE (FLAGYL) IVPB 500 mg     500 mg 100 mL/hr over 60 Minutes Intravenous  Once 09/09/19 0957 09/09/19 1345   09/09/19 1000  ceFEPIme (MAXIPIME) 2 g in sodium chloride 0.9 % 100 mL IVPB     2 g 200 mL/hr over 30 Minutes Intravenous  Once 09/09/19 0959 09/09/19 1221   09/09/19 1000  vancomycin (VANCOCIN) IVPB 1000 mg/200 mL premix     1,000 mg 200 mL/hr over 60 Minutes Intravenous  Once 09/09/19 0272 09/09/19 1518      Subjective: Patient was seen and examined at bedside, she is awake but not alert and oriented.  she opens her eyes on calling her name.  Not in any distress.  Objective: Vitals:   09/17/19 2002 09/18/19 0546 09/19/19 0700 09/19/19 0757  BP: (!) 117/49 126/75 105/63 119/75  Pulse: 91 (!) 105 89 95  Resp: 16 16 17 16   Temp: 98.6 F (37 C) 98.3 F (36.8 C) 98.3 F (36.8 C) 97.7 F (36.5 C)  TempSrc: Oral Oral Oral Oral  SpO2: 100% 98% 98% 100%  Weight:      Height:        Intake/Output Summary (Last 24 hours) at 09/19/2019 1518 Last data filed at 09/19/2019 0700 Gross per 24 hour  Intake --  Output 850 ml  Net -850 ml   Filed Weights   09/09/19 2300 09/10/19 0457 09/11/19 0500  Weight: 48.1 kg 48.1 kg 53.8 kg    Examination:  General exam: Appears calm and comfortable  Respiratory system: Clear  to auscultation. Respiratory effort normal. Cardiovascular system: S1 & S2 heard, RRR. No JVD, murmurs, rubs, gallops or clicks. No pedal edema. Gastrointestinal system: Abdomen is nondistended, soft and nontender. No organomegaly or masses felt. Normal bowel sounds heard. Central nervous system: Alert and oriented. No focal neurological deficits. Extremities: No edema no swelling. Skin: No rashes, lesions or ulcers   Data Reviewed: I have personally reviewed following labs and imaging studies  CBC: Recent Labs  Lab 09/14/19 0828 09/17/19 0455 09/18/19 0554  WBC 11.6* 9.3 8.1  NEUTROABS 8.9* 5.8 5.0  HGB 11.0* 10.7* 11.9*  HCT 36.3 32.0* 35.9*  MCV 100.8* 90.1 90.7  PLT 205 PLATELET CLUMPS NOTED ON SMEAR, UNABLE TO ESTIMATE PLATELET CLUMPS NOTED ON SMEAR, UNABLE TO ESTIMATE   Basic Metabolic Panel: Recent Labs  Lab 09/15/19 0451 09/16/19 0438 09/17/19 0455 09/18/19 0554 09/19/19 0457  NA 158* 151* 146* 141 139  K 4.5 4.4 4.1 2.9* 2.7*  CL 126* 120* 113* 104 104  CO2 25 24 24 24 23   GLUCOSE 148* 111* 129* 115* 140*  BUN 21 17 14  12  9  CREATININE 0.80 0.78 0.73 0.66 0.71  CALCIUM 7.9* 8.0* 7.7* 7.7* 7.5*   GFR: Estimated Creatinine Clearance: 45.8 mL/min (by C-G formula based on SCr of 0.71 mg/dL). Liver Function Tests: No results for input(s): AST, ALT, ALKPHOS, BILITOT, PROT, ALBUMIN in the last 168 hours. No results for input(s): LIPASE, AMYLASE in the last 168 hours. No results for input(s): AMMONIA in the last 168 hours. Coagulation Profile: No results for input(s): INR, PROTIME in the last 168 hours. Cardiac Enzymes: No results for input(s): CKTOTAL, CKMB, CKMBINDEX, TROPONINI in the last 168 hours. BNP (last 3 results) No results for input(s): PROBNP in the last 8760 hours. HbA1C: No results for input(s): HGBA1C in the last 72 hours. CBG: No results for input(s): GLUCAP in the last 168 hours. Lipid Profile: No results for input(s): CHOL, HDL, LDLCALC,  TRIG, CHOLHDL, LDLDIRECT in the last 72 hours. Thyroid Function Tests: No results for input(s): TSH, T4TOTAL, FREET4, T3FREE, THYROIDAB in the last 72 hours. Anemia Panel: No results for input(s): VITAMINB12, FOLATE, FERRITIN, TIBC, IRON, RETICCTPCT in the last 72 hours. Sepsis Labs: No results for input(s): PROCALCITON, LATICACIDVEN in the last 168 hours.  Recent Results (from the past 240 hour(s))  MRSA PCR Screening     Status: None   Collection Time: 09/09/19 10:50 PM   Specimen: Nasal Mucosa; Nasopharyngeal  Result Value Ref Range Status   MRSA by PCR NEGATIVE NEGATIVE Final    Comment:        The GeneXpert MRSA Assay (FDA approved for NASAL specimens only), is one component of a comprehensive MRSA colonization surveillance program. It is not intended to diagnose MRSA infection nor to guide or monitor treatment for MRSA infections. Performed at [facility], [place] [address], [place], [place] [address]      Radiology Studies: No results found.  Scheduled Meds: . Chlorhexidine Gluconate Cloth  6 each Topical Daily  . feeding supplement (ENSURE ENLIVE)  237 mL Oral BID BM  . mouth rinse  15 mL Mouth Rinse BID   Continuous Infusions: . dextrose 75 mL/hr at 09/19/19 0727  . potassium chloride       LOS: 10 days    Time spent: 25 mins    Shawna Clamp, MD [facility]   If 7PM-7AM, please contact night-coverage

## 2019-09-20 LAB — BASIC METABOLIC PANEL
Anion gap: 12 (ref 5–15)
Anion gap: 11 (ref 5–15)
BUN: 9 mg/dL (ref 8–23)
BUN: 7 mg/dL — ABNORMAL LOW (ref 8–23)
CO2: 23 mmol/L (ref 22–32)
CO2: 26 mmol/L (ref 22–32)
Calcium: 7.5 mg/dL — ABNORMAL LOW (ref 8.9–10.3)
Calcium: 7.9 mg/dL — ABNORMAL LOW (ref 8.9–10.3)
Chloride: 104 mmol/L (ref 98–111)
Chloride: 104 mmol/L (ref 98–111)
Creatinine, Ser: 0.71 mg/dL (ref 0.44–1.00)
Creatinine, Ser: 0.79 mg/dL (ref 0.44–1.00)
GFR calc Af Amer: >60 mL/min (ref >60)
GFR calc Af Amer: >60 mL/min (ref >60)
GFR calc non Af Amer: >60 mL/min (ref >60)
GFR calc non Af Amer: >60 mL/min (ref >60)
Glucose, Bld: 140 mg/dL — ABNORMAL HIGH (ref 70–99)
Glucose, Bld: 123 mg/dL — ABNORMAL HIGH (ref 70–99)
Potassium: 2.7 mmol/L — CL (ref 3.5–5.1)
Potassium: 2.7 mmol/L — CL (ref 3.5–5.1)
Sodium: 139 mmol/L (ref 135–145)
Sodium: 141 mmol/L (ref 135–145)

## 2019-09-20 MED ORDER — POTASSIUM CHLORIDE 10 MEQ/100ML IV SOLN
10.0000 meq | INTRAVENOUS | Status: AC
  Administered 2019-09-20 (×4): 10 meq via INTRAVENOUS
  Filled 2019-09-20 (×2): qty 100

## 2019-09-20 MED ORDER — POTASSIUM CHLORIDE 10 MEQ/100ML IV SOLN
10.0000 meq | INTRAVENOUS | Status: AC
  Administered 2019-09-20 (×2): 10 meq via INTRAVENOUS
  Filled 2019-09-20: qty 100

## 2019-09-20 NOTE — Progress Notes (Signed)
PROGRESS NOTE    Tracy [name]  ZHG:[id number] DOB: [birth year] DOA: 09/09/2019   PCP: Tresa Garter, MD   Brief Narrative:  Patient is a 79 year old female with history of dementia, CVA with right-sided residual weakness, hypertension, diabetes, recent infection with Covid in November 2020 with severe encephalopathy who presents from skilled nursing facility with worsening mental status. Patient was obtunded, altered on presentation.  Since her Covid infection, her mentation has been severely affected.  She became lethargic, hypotensive in the nursing home so she was sent to ER.  On presentation she was hypothermic, tachycardic, had AKI, sodium of 179. Chest x-ray did not show pneumonia.  CT head/abdomen were unremarkable.  He was started on broad-spectrum antibiotics. Given her poor prognosis, multiple discussion was held with her eldest daughter [name] and she transitioned her care to comfort.  But later on, she came under tremendous pressure from other family members regarding that decision.  Her other family members think that she still has a chance to get better and are interested on putting a feeding tube.  Palliative care consulted. Palliative care following with family.  Patient's  eldest daughter [name] has withdrawn from process of guardianship.  She is discussing with her other family members about her mother's care plan. She is from [facility] . But as per the case manager, her insurance has high chance of denying for going back to the skilled nursing facility. Family has decided for home with home hospice. She is supposed to be discharged to Home hospice but given low potassium ( 2.7)  it is held until corrected.  Assessment & Plan:   Principal Problem:   Sepsis, unspecified organism ([name]) Active Problems:   HTN (hypertension)   History of CVA (cerebrovascular accident)   Dementia with aggressive behavior ([name])   Diabetes mellitus type 2 in obese [facility])   Palliative  care encounter   Hypernatremia   Chronic respiratory failure with hypoxia ([name])   Acute renal failure (ARF) ([name])   Acute metabolic encephalopathy   Coagulopathy ([name])   Transaminitis   Sepsis ([name])  Severe hypenatremia/dehydration/failure to thrive: Presented with worsening mental status, decreased responsiveness, hypotension, tachycardia.  Sodium was elevated to 178 on presentation.  Urine osmolality was 586, urine sodium was 14. Nephrology was consulted on presentation.  Nephrology has signed off because she was changed to comfort care. Family requested for putting a feeding tube.  We talked to the decision-maker daughter [name] and she understands that feeding tube does not help.  We have not put an order for feeding tube.  We have started on gentle D5  IV fluids until we have full support from the family for making her comfort care.  Family has decided for home with home hospice.  Sepsis present on admission/UTI: Elevated white cell counts, lactic acid on admission.  Urine culture showed E. coli.  She completed antibiotics course.  Hypertension: BP stable.  AKI: Resolved with IV fluids.  Covid-19 encephalopathy/dementia: Not  oriented.  Continue to monitor mental status.  Also has toxic metabolic encephalopathy secondary to dehydration, UTI, AKI.  Patient also has dementia at baseline.  She has history of CVA  Transaminitis: Secondary to underlying sepsis/dehydration/shock liver from hypotension.  Goals of care: As mentioned above, ongoing confusion and disagreement in the family regarding her care.  Eldest daughter, [name], is very understandable and she is interested on making her comfort care but she is not official power of attorney.  She is discussing with other family members for  conclusive decision. Palliative care closely following..  Continue other comfort care measures like morphine, glycopyrrolate, anxiolytics for now. Family has decided for home with home hospice.   Anticipated discharge tomorrow, needs ambulance transport home.   DVT prophylaxis: None. Code Status: DNR Family Communication: [name] Daughter on 09/18/19 Disposition Plan: Home with Home hospice. Consultants:   Palliative care.  Procedures: None Antimicrobials: Anti-infectives (From admission, onward)   Start     Dose/Rate Route Frequency Ordered Stop   09/14/19 1500  cefTRIAXone (ROCEPHIN) 1 g in sodium chloride 0.9 % 100 mL IVPB  Status:  Discontinued     1 g 200 mL/hr over 30 Minutes Intravenous Every 24 hours 09/14/19 1344 09/18/19 1624   09/09/19 2200  cefTRIAXone (ROCEPHIN) 1 g in sodium chloride 0.9 % 100 mL IVPB  Status:  Discontinued     1 g 200 mL/hr over 30 Minutes Intravenous Every 24 hours 09/09/19 2027 09/11/19 0840   09/09/19 1000  metroNIDAZOLE (FLAGYL) IVPB 500 mg     500 mg 100 mL/hr over 60 Minutes Intravenous  Once 09/09/19 0957 09/09/19 1345   09/09/19 1000  ceFEPIme (MAXIPIME) 2 g in sodium chloride 0.9 % 100 mL IVPB     2 g 200 mL/hr over 30 Minutes Intravenous  Once 09/09/19 0959 09/09/19 1221   09/09/19 1000  vancomycin (VANCOCIN) IVPB 1000 mg/200 mL premix     1,000 mg 200 mL/hr over 60 Minutes Intravenous  Once 09/09/19 6195 09/09/19 1518      Subjective: Patient was seen and examined at bedside, she is awake but not alert and oriented.  she opens her eyes on calling her name.  Not in any distress.  Objective: Vitals:   09/18/19 0546 09/19/19 0700 09/19/19 0757 09/20/19 0601  BP: 126/75 105/63 119/75 (!) 98/48  Pulse: (!) 105 89 95 74  Resp: 16 17 16 16   Temp: 98.3 F (36.8 C) 98.3 F (36.8 C) 97.7 F (36.5 C) 98.5 F (36.9 C)  TempSrc: Oral Oral Oral Oral  SpO2: 98% 98% 100% 93%  Weight:      Height:        Intake/Output Summary (Last 24 hours) at 09/20/2019 1337 Last data filed at 09/20/2019 1159 Gross per 24 hour  Intake 3080.58 ml  Output 1451 ml  Net 1629.58 ml   Filed Weights   09/09/19 2300 09/10/19 0457 09/11/19 0500    Weight: 48.1 kg 48.1 kg 53.8 kg    Examination:  General exam: Appears calm and comfortable  Respiratory system: Clear to auscultation. Respiratory effort normal. Cardiovascular system: S1 & S2 heard, RRR. No JVD, murmurs, rubs, gallops or clicks. No pedal edema. Gastrointestinal system: Abdomen is nondistended, soft and nontender. No organomegaly or masses felt. Normal bowel sounds heard. Central nervous system: Alert and oriented. No focal neurological deficits. Extremities: No edema no swelling. Skin: No rashes, lesions or ulcers   Data Reviewed: I have personally reviewed following labs and imaging studies  CBC: Recent Labs  Lab 09/14/19 0828 09/17/19 0455 09/18/19 0554  WBC 11.6* 9.3 8.1  NEUTROABS 8.9* 5.8 5.0  HGB 11.0* 10.7* 11.9*  HCT 36.3 32.0* 35.9*  MCV 100.8* 90.1 90.7  PLT 205 PLATELET CLUMPS NOTED ON SMEAR, UNABLE TO ESTIMATE PLATELET CLUMPS NOTED ON SMEAR, UNABLE TO ESTIMATE   Basic Metabolic Panel: Recent Labs  Lab 09/16/19 0438 09/17/19 0455 09/18/19 0554 09/19/19 0457 09/20/19 0542  NA 151* 146* 141 139 141  K 4.4 4.1 2.9* 2.7* 2.7*  CL 120* 113* 104  104 104  CO2 24 24 24 23 26   GLUCOSE 111* 129* 115* 140* 123*  BUN 17 14 12 9  7*  CREATININE 0.78 0.73 0.66 0.71 0.79  CALCIUM 8.0* 7.7* 7.7* 7.5* 7.9*   GFR: Estimated Creatinine Clearance: 45.8 mL/min (by C-G formula based on SCr of 0.79 mg/dL). Liver Function Tests: No results for input(s): AST, ALT, ALKPHOS, BILITOT, PROT, ALBUMIN in the last 168 hours. No results for input(s): LIPASE, AMYLASE in the last 168 hours. No results for input(s): AMMONIA in the last 168 hours. Coagulation Profile: No results for input(s): INR, PROTIME in the last 168 hours. Cardiac Enzymes: No results for input(s): CKTOTAL, CKMB, CKMBINDEX, TROPONINI in the last 168 hours. BNP (last 3 results) No results for input(s): PROBNP in the last 8760 hours. HbA1C: No results for input(s): HGBA1C in the last 72  hours. CBG: No results for input(s): GLUCAP in the last 168 hours. Lipid Profile: No results for input(s): CHOL, HDL, LDLCALC, TRIG, CHOLHDL, LDLDIRECT in the last 72 hours. Thyroid Function Tests: No results for input(s): TSH, T4TOTAL, FREET4, T3FREE, THYROIDAB in the last 72 hours. Anemia Panel: No results for input(s): VITAMINB12, FOLATE, FERRITIN, TIBC, IRON, RETICCTPCT in the last 72 hours. Sepsis Labs: No results for input(s): PROCALCITON, LATICACIDVEN in the last 168 hours.  No results found for this or any previous visit (from the past 240 hour(s)).   Radiology Studies: No results found.  Scheduled Meds: . Chlorhexidine Gluconate Cloth  6 each Topical Daily  . feeding supplement (ENSURE ENLIVE)  237 mL Oral BID BM  . mouth rinse  15 mL Mouth Rinse BID   Continuous Infusions: . dextrose 75 mL/hr at 09/20/19 1018  . potassium chloride 10 mEq (09/20/19 1239)     LOS: 11 days    Time spent: 25 mins    Kannon Baum, MD [facility]   If 7PM-7AM, please contact night-coverage

## 2019-09-20 NOTE — Progress Notes (Signed)
Civil engineer, contracting Documentation  Liaison notes that pt will not dc home today. [facility] will continue to follow pt to monitor for possible discharge so that an admission visit can be arranged once pt does dc.  Please call with any questions.   Thank you,  Trena Platt, RN [facility] Liaison [phone]

## 2019-09-21 MED ORDER — MORPHINE SULFATE (PF) 2 MG/ML IV SOLN: 1.0000 mg | INTRAVENOUS | 0 refills | Status: AC | PRN

## 2019-09-21 MED ORDER — SODIUM CHLORIDE 0.9 % IV BOLUS
250.0000 mL | Freq: Once | INTRAVENOUS | Status: AC
  Administered 2019-09-21: 11:00:00 250 mL via INTRAVENOUS

## 2019-09-21 MED ORDER — GLYCOPYRROLATE 0.2 MG/ML IJ SOLN: 0.2000 mg | INTRAMUSCULAR | 0 refills | Status: AC | PRN

## 2019-09-21 MED ORDER — POTASSIUM CHLORIDE 20 MEQ PO PACK
40.0000 meq | PACK | Freq: Once | ORAL | Status: AC
  Administered 2019-09-21: 12:00:00 40 meq via ORAL
  Filled 2019-09-21: qty 2

## 2019-09-21 NOTE — Discharge Summary (Signed)
Physician Discharge Summary  Tracy [name] YYT:[id number] DOB: 06/17/1941 DOA: 09/09/2019  PCP: Tracy Garter, MD  Admit date: 09/09/2019 Discharge date: 09/21/2019  Admitted From: Skilled Nursing home. Disposition:  (Residential Hospice)  Recommendations for Outpatient Follow-up:  1. Follow up with PCP in 1-2 weeks 2. Please obtain BMP/CBC in one week 3.  Patient is being discharged home with home hospice.  Home Health:  Home Hospice Equipment/Devices:  Discharge Condition: Fair CODE STATUS:DNR/ Comfort care. Diet recommendation: Heart Healthy / Carb Modified / Regular / Dysphagia   Brief Summary / Hospital course. Patient is a 79 year old female with history of dementia, CVA with right-sided residual weakness, hypertension, diabetes, recent infection with Covid in November 2020 with severe encephalopathy who presents from skilled nursing facility with worsening mental status. Patient was obtunded, altered on presentation. Since her Covid infection, her mentation has been severely affected. She became lethargic, hypotensive in the nursing home so she was sent to ER. On presentation she was hypothermic, tachycardic, had AKI, sodium of 179. Chest x-ray did not show pneumonia. CT head/abdomen were unremarkable. She was started on broad-spectrum antibiotics. Given her poor prognosis, multiple discussion were held with her eldest daughter Tracy [name] shetransitioned her care to comfort care.But later on, she came undertremendous pressure from other family members regarding that decision. Her other family members think that she still has a chance to get better and are interested on putting a feeding tube. Palliative care consulted. Palliative care following with family.Patient'seldest daughter Tracy [name] has withdrawn from process of guardianship. She has discussed with her other family members about her mother's care plan. Family has decided for home with home hospice. She was  supposed to be discharged on saturday but discharge was held due to K 2.7 and BP 88/59 which improved, potassium replacement completed. Patient is being discharged today home with home hospice. Continue othercomfort care measures like morphine, glycopyrrolate, anxiolytics for now.  She was managed for below problems.  Assessment & Plan:   Principal Problem:   Sepsis, unspecified organism ([name]) Active Problems:   HTN (hypertension)   History of CVA (cerebrovascular accident)   Dementia with aggressive behavior ([name])   Diabetes mellitus type 2 in obese [facility])   Palliative care encounter   Hypernatremia   Chronic respiratory failure with hypoxia ([name])   Acute renal failure (ARF) ([name])   Acute metabolic encephalopathy   Coagulopathy ([name])   Transaminitis   Sepsis ([name])  Severe hypenatremia/dehydration/failure to thrive:Presented with worsening mental status, decreased responsiveness, hypotension, tachycardia. Sodium was elevated to 178 on presentation. Urine osmolality was 586, urine sodium was 14. Nephrology was consulted on presentation. Nephrology has signed off because she was changed to comfort care. Family requested for putting a feeding tube. We talked to the decision-maker daughter Tracy [name] and she understands that feeding tube does not help. We have not put an order for feeding tube. We have started on gentle D5 IV fluids until we have full support from the family for making her comfort care.  Family has decided for home with home hospice.  Sepsis present on admission/UTI:Elevated white cell counts, lactic acid on admission. Urine culture showed E. coli. She completed antibiotics course.  Hypertension: BP stable.  AKI: Resolved with IV fluids.  Covid-19 encephalopathy/dementia: Not oriented. Continue to monitor mental status. Also has toxic metabolic encephalopathy secondary to dehydration, UTI, AKI. Patient also has dementia at baseline. She has history of  CVA  Transaminitis: Secondary to underlying sepsis/dehydration/shock liver from hypotension.  Goals of care:As mentioned above,  ongoing confusion and disagreement in the family regarding her care. Eldest daughter, Tracy [name], is very understandable and she is interested on making her comfort care but she is not official power of attorney. She is discussing with other family members for conclusive decision. Palliative care closely following.. Continue othercomfort care measures like morphine, glycopyrrolate, anxiolytics for now. Family has decided for home with home hospice.  Anticipated discharge tomorrow, needS ambulance transport home.   DVT prophylaxis: None. Code Status: DNR / Comfort care. Family Communication: Tracy [name] Daughter on 09/18/19 Disposition Plan: Home with Home hospice. Consultants:   Palliative care.     Discharge Diagnoses:  Principal Problem:   Sepsis, unspecified organism ([name]) Active Problems:   HTN (hypertension)   History of CVA (cerebrovascular accident)   Dementia with aggressive behavior ([name])   Diabetes mellitus type 2 in obese [facility]([name])   Palliative care encounter   Hypernatremia   Chronic respiratory failure with hypoxia ([name])   Acute renal failure (ARF) ([name])   Acute metabolic encephalopathy   Coagulopathy ([name])   Transaminitis   Sepsis ([name])    Discharge Instructions  Discharge Instructions    Diet - low sodium heart healthy   Complete by: As directed    Discharge instructions   Complete by: As directed    Patient is being discharged home with home hospice,.   Increase activity slowly   Complete by: As directed      Allergies as of 09/21/2019      Reactions   Wellbutrin [bupropion] Other (See Comments)   Wt loss   Seroquel [quetiapine Fumarate]    Seroquel is too strong at 1/2 - 1/4 - drooling      Medication List    STOP taking these medications   cholecalciferol 1000 units tablet Commonly known as: VITAMIN D    cyanocobalamin 1000 MCG tablet Commonly known as: CVS VITAMIN B12   Decubi-Vite Caps   glucose blood test strip   lubiprostone 24 MCG capsule Commonly known as: AMITIZA   meclizine 12.5 MG tablet Commonly known as: ANTIVERT   omeprazole 20 MG capsule Commonly known as: PRILOSEC     TAKE these medications   acetaminophen 325 MG tablet Commonly known as: TYLENOL Take 650 mg by mouth 2 (two) times daily.   aspirin EC 81 MG tablet Take 1 tablet (81 mg total) by mouth daily.   divalproex 125 MG capsule Commonly known as: DEPAKOTE SPRINKLE Take 250 mg by mouth 3 (three) times daily.   donepezil 5 MG tablet Commonly known as: ARICEPT Take 5 mg by mouth daily.   escitalopram 10 MG tablet Commonly known as: LEXAPRO Take 1 tablet (10 mg total) by mouth daily.   feeding supplement (ENSURE ENLIVE) Liqd Take 237 mLs by mouth 3 (three) times daily between meals.   glycopyrrolate 0.2 MG/ML injection Commonly known as: ROBINUL Inject 1 mL (0.2 mg total) into the skin every 4 (four) hours as needed (excessive secretions).   morphine 2 MG/ML injection Inject 0.5 mLs (1 mg total) into the vein every 2 (two) hours as needed (or dyspnea).      Follow-up Information    Plotnikov, Georgina QuintAleksei V, MD Follow up in 1 week(s).   Specialty: Internal Medicine Contact information: [address] [place] [address] [phone]          Allergies  Allergen Reactions  . Wellbutrin [Bupropion] Other (See Comments)    Wt loss  . Seroquel [Quetiapine Fumarate]     Seroquel is too strong at  1/2 - 1/4 - drooling      Consultations:     Procedures/Studies: CT ABDOMEN PELVIS WO CONTRAST  Result Date: 09/09/2019 CLINICAL DATA:  Altered mental status and abdominal pain. COVID positive. EXAM: CT ABDOMEN AND PELVIS WITHOUT CONTRAST TECHNIQUE: Multidetector CT imaging of the abdomen and pelvis was performed following the standard protocol without IV contrast. COMPARISON:   None. FINDINGS: Lower chest: Minimal bibasilar atelectasis. No infiltrates or effusions. The heart is normal in size. There is moderate tortuosity and ectasia of the thoracic aorta Hepatobiliary: No worrisome hepatic lesions or intrahepatic biliary dilatation. Small low-attenuation lesion in the right hepatic lobe is likely a benign cyst. The gallbladder is surgically absent. No common bile duct dilatation. Pancreas: No mass, inflammation or ductal dilatation. Spleen: Normal size.  No focal lesions. Adrenals/Urinary Tract: Adrenal glands and kidneys are unremarkable. No renal calculi or hydronephrosis. The bladder appears normal. Stomach/Bowel: The stomach, duodenum, small bowel and colon are grossly normal without oral contrast. No inflammatory changes, mass lesions or obstructive findings. The appendix is normal. Vascular/Lymphatic: The aorta is normal in caliber. Minimal atheroscerlotic calcifications. No mesenteric of retroperitoneal mass or adenopathy. Small scattered lymph nodes are noted. Reproductive: The uterus and ovaries are unremarkable. Other: No pelvic mass or adenopathy. No free pelvic fluid collections. No inguinal mass or adenopathy. No abdominal wall hernia or subcutaneous lesions. Musculoskeletal: No significant bony findings. Advanced lower lumbar degenerative disc disease and facet disease. IMPRESSION: 1. No acute abdominal/pelvic findings, mass lesions or adenopathy. 2. Status post cholecystectomy.  No biliary dilatation. Electronically Signed   By: Rudie Meyer M.D.   On: 09/09/2019 14:08   CT Head Wo Contrast  Result Date: 09/09/2019 CLINICAL DATA:  Altered mental status. COVID-19 positive. Dementia. EXAM: CT HEAD WITHOUT CONTRAST TECHNIQUE: Contiguous axial images were obtained from the base of the skull through the vertex without intravenous contrast. COMPARISON:  CT head 08/13/2019 FINDINGS: Brain: Cortical atrophy with ventricular enlargement, stable. Hypodensities left basal  ganglia appear chronic and unchanged. Negative for acute infarct, hemorrhage, mass. Empty sella again noted. Vascular: Normal arterial flow voids Skull: Negative Sinuses/Orbits: Negative Other: None IMPRESSION: Atrophy and chronic ischemic changes stable from prior study. No acute abnormality. Electronically Signed   By: Marlan Palau M.D.   On: 09/09/2019 13:56   US RENAL  Result Date: 09/09/2019 CLINICAL DATA:  Initial evaluation for acute renal injury. History of hypertension, diabetes. EXAM: RENAL / URINARY TRACT ULTRASOUND COMPLETE COMPARISON:  Prior CT from earlier the same day. FINDINGS: Right Kidney: Renal measurements: 8.6 x 3.3 x 5.0 cm = volume: 74.6 mL. Diffusely increased echogenicity within the renal parenchyma. No nephrolithiasis or hydronephrosis. No focal renal mass. Left Kidney: Renal measurements: 7.2 x 3.8 x 4.9 cm = volume: 62.5 mL. Diffusely increased echogenicity within the renal parenchyma. No nephrolithiasis or hydronephrosis. No focal renal mass. Bladder: Decompressed with a Foley catheter in place. Other: None. IMPRESSION: 1. Increased echogenicity within the renal parenchyma, compatible with medical renal disease. 2. No hydronephrosis or other significant finding. Electronically Signed   By: Rise Mu M.D.   On: 09/09/2019 18:52   DG Chest Port 1 View  Result Date: 09/09/2019 CLINICAL DATA:  Altered mental status EXAM: PORTABLE CHEST 1 VIEW COMPARISON:  07/28/2019 FINDINGS: Normal heart size and mediastinal contours. There is suggestion of left-sided volume loss but this could be positional as there is no underlying infiltrate. No edema. No effusion or pneumothorax. No acute osseous findings. Implantable loop recorder.  Cholecystectomy clips IMPRESSION: No active  disease. Electronically Signed   By: Monte Fantasia M.D.   On: 09/09/2019 11:21     Subjective:   Discharge Exam: Vitals:   09/20/19 0601 09/21/19 0547  BP: (!) 98/48 (!) 97/50  Pulse: 74 (!) 101   Resp: 16 14  Temp: 98.5 F (36.9 C) 98.8 F (37.1 C)  SpO2: 93% 100%   Vitals:   09/19/19 0700 09/19/19 0757 09/20/19 0601 09/21/19 0547  BP: 105/63 119/75 (!) 98/48 (!) 97/50  Pulse: 89 95 74 (!) 101  Resp: 17 16 16 14   Temp: 98.3 F (36.8 C) 97.7 F (36.5 C) 98.5 F (36.9 C) 98.8 F (37.1 C)  TempSrc: Oral Oral Oral Oral  SpO2: 98% 100% 93% 100%  Weight:      Height:        General: Pt is alert, awake, not in acute distress Cardiovascular: RRR, S1/S2 +, no rubs, no gallops Respiratory: CTA bilaterally, no wheezing, no rhonchi Abdominal: Soft, NT, ND, bowel sounds + Extremities: no edema, no cyanosis    The results of significant diagnostics from this hospitalization (including imaging, microbiology, ancillary and laboratory) are listed below for reference.     Microbiology: No results found for this or any previous visit (from the past 240 hour(s)).   Labs: BNP (last 3 results) No results for input(s): BNP in the last 8760 hours. Basic Metabolic Panel: Recent Labs  Lab 09/16/19 0438 09/17/19 0455 09/18/19 0554 09/19/19 0457 09/20/19 0542  NA 151* 146* 141 139 141  K 4.4 4.1 2.9* 2.7* 2.7*  CL 120* 113* 104 104 104  CO2 24 24 24 23 26   GLUCOSE 111* 129* 115* 140* 123*  BUN 17 14 12 9  7*  CREATININE 0.78 0.73 0.66 0.71 0.79  CALCIUM 8.0* 7.7* 7.7* 7.5* 7.9*   Liver Function Tests: No results for input(s): AST, ALT, ALKPHOS, BILITOT, PROT, ALBUMIN in the last 168 hours. No results for input(s): LIPASE, AMYLASE in the last 168 hours. No results for input(s): AMMONIA in the last 168 hours. CBC: Recent Labs  Lab 09/17/19 0455 09/18/19 0554  WBC 9.3 8.1  NEUTROABS 5.8 5.0  HGB 10.7* 11.9*  HCT 32.0* 35.9*  MCV 90.1 90.7  PLT PLATELET CLUMPS NOTED ON SMEAR, UNABLE TO ESTIMATE PLATELET CLUMPS NOTED ON SMEAR, UNABLE TO ESTIMATE   Cardiac Enzymes: No results for input(s): CKTOTAL, CKMB, CKMBINDEX, TROPONINI in the last 168 hours. BNP: Invalid  input(s): POCBNP CBG: No results for input(s): GLUCAP in the last 168 hours. D-Dimer No results for input(s): DDIMER in the last 72 hours. Hgb A1c No results for input(s): HGBA1C in the last 72 hours. Lipid Profile No results for input(s): CHOL, HDL, LDLCALC, TRIG, CHOLHDL, LDLDIRECT in the last 72 hours. Thyroid function studies No results for input(s): TSH, T4TOTAL, T3FREE, THYROIDAB in the last 72 hours.  Invalid input(s): FREET3 Anemia work up No results for input(s): VITAMINB12, FOLATE, FERRITIN, TIBC, IRON, RETICCTPCT in the last 72 hours. Urinalysis    Component Value Date/Time   COLORURINE AMBER (A) 09/09/2019 1047   APPEARANCEUR HAZY (A) 09/09/2019 1047   LABSPEC 1.023 09/09/2019 1047   PHURINE 5.0 09/09/2019 1047   GLUCOSEU NEGATIVE 09/09/2019 1047   GLUCOSEU NEGATIVE 05/17/2016 1201   HGBUR SMALL (A) 09/09/2019 1047   BILIRUBINUR SMALL (A) 09/09/2019 1047   [place] 09/09/2019 1047   PROTEINUR 30 (A) 09/09/2019 1047   UROBILINOGEN 1.0 05/17/2016 1201   NITRITE NEGATIVE 09/09/2019 1047   LEUKOCYTESUR MODERATE (A) 09/09/2019 1047   Sepsis  Labs Invalid input(s): PROCALCITONIN,  WBC,  LACTICIDVEN Microbiology No results found for this or any previous visit (from the past 240 hour(s)).   Time coordinating discharge: Over 30 minutes  SIGNED:   Cipriano Bunker, MD  [facility] 09/21/2019, 11:10 AM Pager   If 7PM-7AM, please contact night-coverage [email or web address] Password [email or web address]

## 2019-09-21 NOTE — Progress Notes (Signed)
Civil engineer, contracting Documentation  Liaison notes that pt will dc home today under hospice care. Pt's DME has been delivered and pt is ready to be transported home for [facility] admission visit that will take place at 4pm today.   Please dc pt with any new rxs and DNR paperwork.   Thank you for the referral!  Trena Platt, RN [facility] Liaison [phone]

## 2019-09-21 NOTE — Discharge Instructions (Signed)
Patient is being discharged home with home hospice 

## 2019-09-21 NOTE — TOC Progression Note (Signed)
Transition of Care [facility]) - Progression Note    Patient Details  Name: Tracy [name] MRN: [id number] Date of Birth: 01-29-1941  Transition of Care [facility]) CM/SW Contact  Armanda Heritage, RN Phone Number: 09/21/2019, 12:09 PM  Clinical Narrative:    [facility] transport arranged for patient. CM spoke with daughter, [name], and informed of discharge.      Expected Discharge Plan: Home w Hospice Care Barriers to Discharge: No Barriers Identified  Expected Discharge Plan and Services Expected Discharge Plan: Home w Hospice Care         Expected Discharge Date: 09/21/19                           [facility] Agency: [facility] [place] Date [facility] Agency Contacted: 09/19/19 Time HH Agency Contacted: 1301 Representative spoke with at [facility] Agency: Victorino Dike   Social Determinants of Health (SDOH) Interventions    Readmission Risk Interventions Readmission Risk Prevention Plan 09/19/2019  Transportation Screening Complete  PCP or Specialist Appt within 3-5 Days Complete  HRI or Home Care Consult Complete  Social Work Consult for Recovery Care Planning/Counseling Complete  Palliative Care Screening Complete  Medication Review Oceanographer) Complete  Some recent data might be hidden

## 2019-09-21 NOTE — Telephone Encounter (Signed)
Yes, thank you.

## 2019-09-22 ENCOUNTER — Other Ambulatory Visit: Payer: Self-pay

## 2019-09-22 ENCOUNTER — Telehealth: Payer: Self-pay | Admitting: *Deleted

## 2019-09-22 NOTE — Telephone Encounter (Signed)
lvm advising dr plotnikovs note

## 2019-09-22 NOTE — Telephone Encounter (Signed)
The pt was on TCM report admitted 09/09/19 for Sepsis. Since her Covid infection, hermentation has been severely affected. She became lethargic, hypotensive in the nursing home so she was sent to ER. On presentation she was hypothermic, tachycardic, had AKI, sodium of 179. Chest x-ray did not show pneumonia. CT head/abdomen were unremarkable. She was started on broad-spectrum antibiotics. Family has decided for home with home hospice. Pt D/C 09/21/19 to home hospice w/comfort care.Marland KitchenRaechel Chute

## 2019-09-22 NOTE — Patient Outreach (Addendum)
  [facility]) Care Management Chronic Special Needs Program  09/22/2019  Name: [name] DOB: 1941-04-01  MRN: [id number]  Tracy [name] is enrolled in a chronic special needs plan for Diabetes. Reviewed and updated care plan.  Client admitted on 09/09/19 with dx of dehydration, hypernatremia and altered mental status.Discharged home with [facility] on 09/21/19  Goals Addressed            This Visit's Progress   . General - Client will not be readmitted within 30 days (C-SNP)discharged 09/21/19       Discharged home with Hospice    . COMPLETED: General - Client will not be readmitted within 30 days (C-SNP)discharged from skilled care on 09/01/19)      . Physical and Spiritual Needs Met       [facility] to provide assistance for physical and spiritual Needs      Transition of care call completed through [place] discharge call.  RNCM to follow up on any red flags alerts Discharged home with [place]:  Send  outreach letter with a copy of their individualized care plan and Send individual care plan to provider  Chronic care management coordinator will outreach in:  One month or sooner as needed     [place], Jackquline Denmark, [place] Management [phone]

## 2019-09-26 ENCOUNTER — Other Ambulatory Visit: Payer: Self-pay

## 2019-09-26 NOTE — Patient Outreach (Signed)
  [name]) Care Management Chronic Special Needs Program  09/26/2019  Name: KEMYA SHED DOB: [birth year]  MRN: [id number]  Ms. Monserrath Junio is enrolled in a chronic special needs plan for Diabetes. Client's daughter [name] called with no answer No answer and HIPAA compliant message left with daughter [name] Designated Party Release to follow up on client's condition.  Daughter [name] returned message HIPAA verified States that client is at home with hospice care now.  States that she and her siblings are providing 24 hour care for her Mother.  States that her Mother is getting very thin, she is restless and confused.  States she will sometimes drink some fluids but not often.  States that they know to call hospice for any questions or problems.  States they have been instructed to call hospice and not 911.  [name] states that she is praying for her Mother to pass peacefully and she has made peace with that and her family Allowed daughter to express her feelings of grief and encouraged to contact her work place employee assistance( [place])if she needs more emotional support. Instructed she can contact RNCM for any questions or concerns as needed.  Sates she will keep RNCM updated as needed Plan to contact as scheduled and as needed  Dudley Major RN, Maximiano Coss, CDE Chronic Care Management Coordinator [name] Care Management [phone]

## 2019-10-09 ENCOUNTER — Telehealth: Payer: Self-pay

## 2019-10-09 NOTE — Telephone Encounter (Signed)
Sam with [facility] calling to report the death of of the patient on  11-02-19 at 06:16am CB#: [phone]

## 2019-10-09 NOTE — Telephone Encounter (Signed)
FYI

## 2019-10-13 ENCOUNTER — Telehealth: Payer: Self-pay | Admitting: Emergency Medicine

## 2019-10-13 NOTE — Telephone Encounter (Signed)
Funeral home dropped off death certificate to be signed. Placed in MD's box.

## 2019-10-13 DEATH — deceased

## 2019-10-14 ENCOUNTER — Ambulatory Visit: Payer: HMO | Admitting: Internal Medicine

## 2019-10-15 ENCOUNTER — Other Ambulatory Visit: Payer: Self-pay

## 2019-10-15 NOTE — Telephone Encounter (Signed)
Death certificate sign. Funeral home notified to pick up and [facility] emailed to mark chart deceased.

## 2019-10-15 NOTE — Patient Outreach (Signed)
  [name]) Care Management Chronic Special Needs Program    10/15/2019  Name: Tracy [name], DOB: [birth year]  MRN: [id number]   Ms. [name] was enrolled in a chronic special needs plan for Diabetes.  Case closed as client expired on October 17, 2019 Plan to send case closure letter to client and MD  Dudley Major RN, [facility], CDE Chronic Care Management Coordinator [name] Care Management [phone]

## 2019-10-23 ENCOUNTER — Ambulatory Visit: Payer: HMO

## 2019-10-28 ENCOUNTER — Ambulatory Visit: Payer: HMO
